# Patient Record
Sex: Female | Born: 1945 | Race: White | Hispanic: No | Marital: Single | Smoking: Never smoker
Health system: Southern US, Community
[De-identification: ages and names within clinical notes are randomized; demographics above are authoritative.]

## PROBLEM LIST (undated history)

## (undated) DIAGNOSIS — N189 Chronic kidney disease, unspecified: Secondary | ICD-10-CM

## (undated) DIAGNOSIS — E785 Hyperlipidemia, unspecified: Secondary | ICD-10-CM

## (undated) DIAGNOSIS — J323 Chronic sphenoidal sinusitis: Secondary | ICD-10-CM

## (undated) DIAGNOSIS — L409 Psoriasis, unspecified: Secondary | ICD-10-CM

## (undated) DIAGNOSIS — H532 Diplopia: Secondary | ICD-10-CM

## (undated) DIAGNOSIS — G4700 Insomnia, unspecified: Secondary | ICD-10-CM

## (undated) DIAGNOSIS — R7302 Impaired glucose tolerance (oral): Secondary | ICD-10-CM

## (undated) DIAGNOSIS — F329 Major depressive disorder, single episode, unspecified: Secondary | ICD-10-CM

## (undated) DIAGNOSIS — L738 Other specified follicular disorders: Secondary | ICD-10-CM

## (undated) DIAGNOSIS — K5790 Diverticulosis of intestine, part unspecified, without perforation or abscess without bleeding: Secondary | ICD-10-CM

## (undated) DIAGNOSIS — I872 Venous insufficiency (chronic) (peripheral): Secondary | ICD-10-CM

## (undated) DIAGNOSIS — M8580 Other specified disorders of bone density and structure, unspecified site: Secondary | ICD-10-CM

## (undated) DIAGNOSIS — I10 Essential (primary) hypertension: Secondary | ICD-10-CM

## (undated) DIAGNOSIS — K219 Gastro-esophageal reflux disease without esophagitis: Secondary | ICD-10-CM

## (undated) DIAGNOSIS — N390 Urinary tract infection, site not specified: Secondary | ICD-10-CM

## (undated) DIAGNOSIS — G20 Parkinson's disease: Secondary | ICD-10-CM

## (undated) DIAGNOSIS — M1990 Unspecified osteoarthritis, unspecified site: Secondary | ICD-10-CM

## (undated) DIAGNOSIS — M169 Osteoarthritis of hip, unspecified: Secondary | ICD-10-CM

## (undated) DIAGNOSIS — Z789 Other specified health status: Secondary | ICD-10-CM

## (undated) DIAGNOSIS — I471 Supraventricular tachycardia: Secondary | ICD-10-CM

## (undated) DIAGNOSIS — F439 Reaction to severe stress, unspecified: Secondary | ICD-10-CM

## (undated) DIAGNOSIS — J189 Pneumonia, unspecified organism: Secondary | ICD-10-CM

## (undated) DIAGNOSIS — G20A1 Parkinson's disease without dyskinesia, without mention of fluctuations: Secondary | ICD-10-CM

## (undated) DIAGNOSIS — R002 Palpitations: Secondary | ICD-10-CM

## (undated) HISTORY — PX: CATARACT EXTRACTION W/ INTRAOCULAR LENS  IMPLANT, BILATERAL: SHX1307

## (undated) HISTORY — DX: Insomnia, unspecified: G47.00

## (undated) HISTORY — PX: OTHER SURGICAL HISTORY: SHX169

## (undated) HISTORY — DX: Hyperlipidemia, unspecified: E78.5

## (undated) HISTORY — DX: Parkinson's disease without dyskinesia, without mention of fluctuations: G20.A1

## (undated) HISTORY — DX: Psoriasis, unspecified: L40.9

## (undated) HISTORY — PX: HEMORRHOID SURGERY: SHX153

## (undated) HISTORY — PX: JOINT REPLACEMENT: SHX530

## (undated) HISTORY — DX: Other specified follicular disorders: L73.8

## (undated) HISTORY — DX: Major depressive disorder, single episode, unspecified: F32.9

## (undated) HISTORY — DX: Other specified disorders of bone density and structure, unspecified site: M85.80

## (undated) HISTORY — PX: LASIK: SHX215

## (undated) HISTORY — DX: Supraventricular tachycardia: I47.1

## (undated) HISTORY — PX: URETHRAL SLING: SHX2621

## (undated) HISTORY — DX: Unspecified osteoarthritis, unspecified site: M19.90

## (undated) HISTORY — DX: Reaction to severe stress, unspecified: F43.9

## (undated) HISTORY — DX: Palpitations: R00.2

## (undated) HISTORY — DX: Chronic kidney disease, unspecified: N18.9

## (undated) HISTORY — DX: Diverticulosis of intestine, part unspecified, without perforation or abscess without bleeding: K57.90

## (undated) HISTORY — DX: Osteoarthritis of hip, unspecified: M16.9

## (undated) HISTORY — PX: EYE SURGERY: SHX253

## (undated) HISTORY — DX: Impaired glucose tolerance (oral): R73.02

## (undated) HISTORY — DX: Parkinson's disease: G20

## (undated) HISTORY — DX: Diplopia: H53.2

## (undated) HISTORY — DX: Gastro-esophageal reflux disease without esophagitis: K21.9

---
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
## 1998-12-10 DIAGNOSIS — E782 Mixed hyperlipidemia: Secondary | ICD-10-CM

## 1998-12-10 DIAGNOSIS — E785 Hyperlipidemia, unspecified: Secondary | ICD-10-CM

## 1998-12-10 HISTORY — DX: Mixed hyperlipidemia: E78.2

## 1998-12-10 HISTORY — DX: Hyperlipidemia, unspecified: E78.5

## 2000-09-24 ENCOUNTER — Encounter: Admission: RE | Admit: 2000-09-24 | Discharge: 2000-09-24 | Payer: Self-pay | Admitting: Family Medicine

## 2000-09-24 ENCOUNTER — Encounter: Payer: Self-pay | Admitting: Family Medicine

## 2001-12-24 ENCOUNTER — Encounter: Payer: Self-pay | Admitting: Obstetrics and Gynecology

## 2001-12-24 ENCOUNTER — Ambulatory Visit (HOSPITAL_COMMUNITY): Admission: RE | Admit: 2001-12-24 | Discharge: 2001-12-24 | Payer: Self-pay | Admitting: Obstetrics and Gynecology

## 2002-03-02 ENCOUNTER — Other Ambulatory Visit: Admission: RE | Admit: 2002-03-02 | Discharge: 2002-03-02 | Payer: Self-pay | Admitting: Obstetrics and Gynecology

## 2002-07-15 ENCOUNTER — Encounter: Admission: RE | Admit: 2002-07-15 | Discharge: 2002-07-15 | Payer: Self-pay | Admitting: Obstetrics and Gynecology

## 2002-07-15 ENCOUNTER — Encounter: Payer: Self-pay | Admitting: Obstetrics and Gynecology

## 2002-12-10 DIAGNOSIS — L409 Psoriasis, unspecified: Secondary | ICD-10-CM

## 2002-12-10 HISTORY — DX: Psoriasis, unspecified: L40.9

## 2002-12-30 ENCOUNTER — Encounter: Payer: Self-pay | Admitting: Obstetrics and Gynecology

## 2002-12-30 ENCOUNTER — Ambulatory Visit (HOSPITAL_COMMUNITY): Admission: RE | Admit: 2002-12-30 | Discharge: 2002-12-30 | Payer: Self-pay | Admitting: Obstetrics and Gynecology

## 2003-12-11 DIAGNOSIS — M8580 Other specified disorders of bone density and structure, unspecified site: Secondary | ICD-10-CM

## 2003-12-11 HISTORY — DX: Other specified disorders of bone density and structure, unspecified site: M85.80

## 2004-01-05 ENCOUNTER — Ambulatory Visit (HOSPITAL_COMMUNITY): Admission: RE | Admit: 2004-01-05 | Discharge: 2004-01-05 | Payer: Self-pay | Admitting: Family Medicine

## 2004-09-21 ENCOUNTER — Ambulatory Visit (HOSPITAL_COMMUNITY): Admission: RE | Admit: 2004-09-21 | Discharge: 2004-09-21 | Payer: Self-pay | Admitting: Obstetrics and Gynecology

## 2004-12-10 DIAGNOSIS — K5790 Diverticulosis of intestine, part unspecified, without perforation or abscess without bleeding: Secondary | ICD-10-CM

## 2004-12-10 HISTORY — DX: Diverticulosis of intestine, part unspecified, without perforation or abscess without bleeding: K57.90

## 2005-01-04 ENCOUNTER — Ambulatory Visit: Payer: Self-pay | Admitting: Gastroenterology

## 2005-01-22 ENCOUNTER — Ambulatory Visit: Payer: Self-pay | Admitting: Gastroenterology

## 2005-02-20 ENCOUNTER — Ambulatory Visit (HOSPITAL_COMMUNITY): Admission: RE | Admit: 2005-02-20 | Discharge: 2005-02-20 | Payer: Self-pay | Admitting: Obstetrics and Gynecology

## 2006-02-21 ENCOUNTER — Ambulatory Visit (HOSPITAL_COMMUNITY): Admission: RE | Admit: 2006-02-21 | Discharge: 2006-02-21 | Payer: Self-pay | Admitting: Obstetrics and Gynecology

## 2007-02-24 ENCOUNTER — Ambulatory Visit (HOSPITAL_COMMUNITY): Admission: RE | Admit: 2007-02-24 | Discharge: 2007-02-24 | Payer: Self-pay | Admitting: Obstetrics and Gynecology

## 2007-11-10 ENCOUNTER — Encounter: Admission: RE | Admit: 2007-11-10 | Discharge: 2007-11-10 | Payer: Self-pay | Admitting: Family Medicine

## 2007-12-11 DIAGNOSIS — I471 Supraventricular tachycardia, unspecified: Secondary | ICD-10-CM

## 2007-12-11 DIAGNOSIS — G4700 Insomnia, unspecified: Secondary | ICD-10-CM

## 2007-12-11 DIAGNOSIS — R002 Palpitations: Secondary | ICD-10-CM

## 2007-12-11 HISTORY — DX: Supraventricular tachycardia, unspecified: I47.10

## 2007-12-11 HISTORY — DX: Insomnia, unspecified: G47.00

## 2007-12-11 HISTORY — DX: Palpitations: R00.2

## 2007-12-11 HISTORY — DX: Supraventricular tachycardia: I47.1

## 2008-02-12 ENCOUNTER — Ambulatory Visit: Payer: Self-pay | Admitting: Internal Medicine

## 2008-02-24 ENCOUNTER — Ambulatory Visit: Payer: Self-pay

## 2008-02-24 ENCOUNTER — Encounter: Payer: Self-pay | Admitting: Internal Medicine

## 2008-03-01 ENCOUNTER — Ambulatory Visit (HOSPITAL_COMMUNITY): Admission: RE | Admit: 2008-03-01 | Discharge: 2008-03-01 | Payer: Self-pay | Admitting: Obstetrics and Gynecology

## 2008-09-21 ENCOUNTER — Ambulatory Visit: Payer: Self-pay | Admitting: Cardiology

## 2008-10-11 ENCOUNTER — Encounter: Admission: RE | Admit: 2008-10-11 | Discharge: 2008-10-11 | Payer: Self-pay | Admitting: Family Medicine

## 2008-10-18 ENCOUNTER — Encounter: Admission: RE | Admit: 2008-10-18 | Discharge: 2008-10-18 | Payer: Self-pay | Admitting: Family Medicine

## 2008-11-29 ENCOUNTER — Encounter: Admission: RE | Admit: 2008-11-29 | Discharge: 2008-11-29 | Payer: Self-pay | Admitting: Obstetrics and Gynecology

## 2008-12-10 DIAGNOSIS — F32A Depression, unspecified: Secondary | ICD-10-CM

## 2008-12-10 HISTORY — DX: Depression, unspecified: F32.A

## 2008-12-10 HISTORY — PX: TOTAL HIP ARTHROPLASTY: SHX124

## 2009-03-08 ENCOUNTER — Encounter: Admission: RE | Admit: 2009-03-08 | Discharge: 2009-03-08 | Payer: Self-pay | Admitting: Obstetrics and Gynecology

## 2009-03-17 ENCOUNTER — Encounter: Admission: RE | Admit: 2009-03-17 | Discharge: 2009-03-17 | Payer: Self-pay | Admitting: Family Medicine

## 2009-10-04 ENCOUNTER — Ambulatory Visit: Payer: Self-pay | Admitting: Internal Medicine

## 2009-10-04 DIAGNOSIS — I471 Supraventricular tachycardia: Secondary | ICD-10-CM

## 2009-10-18 ENCOUNTER — Encounter: Admission: RE | Admit: 2009-10-18 | Discharge: 2009-10-18 | Payer: Self-pay | Admitting: Family Medicine

## 2009-10-26 ENCOUNTER — Telehealth (INDEPENDENT_AMBULATORY_CARE_PROVIDER_SITE_OTHER): Payer: Self-pay | Admitting: *Deleted

## 2009-10-31 ENCOUNTER — Inpatient Hospital Stay (HOSPITAL_COMMUNITY): Admission: RE | Admit: 2009-10-31 | Discharge: 2009-11-04 | Payer: Self-pay | Admitting: Orthopedic Surgery

## 2009-12-10 DIAGNOSIS — R7302 Impaired glucose tolerance (oral): Secondary | ICD-10-CM

## 2009-12-10 DIAGNOSIS — H532 Diplopia: Secondary | ICD-10-CM

## 2009-12-10 HISTORY — DX: Diplopia: H53.2

## 2009-12-10 HISTORY — DX: Impaired glucose tolerance (oral): R73.02

## 2009-12-16 ENCOUNTER — Encounter (INDEPENDENT_AMBULATORY_CARE_PROVIDER_SITE_OTHER): Payer: Self-pay | Admitting: *Deleted

## 2009-12-26 ENCOUNTER — Encounter (INDEPENDENT_AMBULATORY_CARE_PROVIDER_SITE_OTHER): Payer: Self-pay | Admitting: *Deleted

## 2010-01-19 ENCOUNTER — Encounter (INDEPENDENT_AMBULATORY_CARE_PROVIDER_SITE_OTHER): Payer: Self-pay | Admitting: *Deleted

## 2010-01-20 ENCOUNTER — Ambulatory Visit: Payer: Self-pay | Admitting: Gastroenterology

## 2010-01-26 ENCOUNTER — Telehealth: Payer: Self-pay | Admitting: Gastroenterology

## 2010-01-30 ENCOUNTER — Ambulatory Visit: Payer: Self-pay | Admitting: Gastroenterology

## 2010-01-30 HISTORY — PX: COLONOSCOPY: SHX174

## 2010-03-10 ENCOUNTER — Ambulatory Visit (HOSPITAL_COMMUNITY): Admission: RE | Admit: 2010-03-10 | Discharge: 2010-03-10 | Payer: Self-pay | Admitting: Obstetrics and Gynecology

## 2011-01-09 NOTE — Letter (Signed)
Summary: Colonoscopy Letter  [facility]  [address] [place], [place] [address]   Phone: [phone]  Fax: [phone]      December 16, 2009 MRN: [id number]   [facility] [address] [place], [place]  [address]   Dear Ms. Winchel,   According to your medical record, it is time for you to schedule a Colonoscopy. The American Cancer Society recommends this procedure as a method to detect early colon cancer. Patients with a family history of colon cancer, or a personal history of colon polyps or inflammatory bowel disease are at increased risk.  This letter has beeen generated based on the recommendations made at the time of your procedure. If you feel that in your particular situation this may no longer apply, please contact our office.  Please call our office at [phone] to schedule this appointment or to update your records at your earliest convenience.  Thank you for cooperating with [place] to provide you with the very best care possible.   Sincerely,  Vania Rea. Jarold Motto, M.D.  [facility] Gastroenterology Division [phone]

## 2011-01-09 NOTE — Letter (Signed)
Summary: Previsit letter  [facility]  [address] [place], [place] [address]   Phone: [phone]  Fax: [phone]       12/26/2009 MRN: [id number]  [facility] [address] [place], [place]  [address]  Dear Marie Ford,  Welcome to the Gastroenterology Division at [facility].    You are scheduled to see a nurse for your pre-procedure visit on 01-20-10 at 11AM on the 3rd floor at [facility], [address]  We ask that you try to arrive at our office 15 minutes prior to your appointment time to allow for check-in.  Your nurse visit will consist of discussing your medical and surgical history, your immediate family medical history, and your medications.    Please bring a complete list of all your medications or, if you prefer, bring the medication bottles and we will list them.  We will need to be aware of both prescribed and over the counter drugs.  We will need to know exact dosage information as well.  If you are on blood thinners (Coumadin, Plavix, Aggrenox, Ticlid, etc.) please call our office today/prior to your appointment, as we need to consult with your physician about holding your medication.   Please be prepared to read and sign documents such as consent forms, a financial agreement, and acknowledgement forms.  If necessary, and with your consent, a friend or relative is welcome to sit-in on the nurse visit with you.  Please bring your insurance card so that we may make a copy of it.  If your insurance requires a referral to see a specialist, please bring your referral form from your primary care physician.  No co-pay is required for this nurse visit.     If you cannot keep your appointment, please call [phone] to cancel or reschedule prior to your appointment date.  This allows [place] the opportunity to schedule an appointment for another patient in need of care.    Thank you for choosing [facility] for your medical needs.  We  appreciate the opportunity to care for you.  Please visit [place] at our website  to learn more about our practice.                     Sincerely.                                                                                                                   The Gastroenterology Division

## 2011-01-09 NOTE — Procedures (Signed)
Summary: Colonoscopy  Patient: Marie Ford Note: All result statuses are Final unless otherwise noted.  Tests: (1) Colonoscopy (COL)   COL Colonoscopy           DONE     [facility]     [address]     [place], [place]  [address]           COLONOSCOPY PROCEDURE REPORT           PATIENT:  Marie Ford  MR#:  [id number]     BIRTHDATE:  1946-09-12, 63 yrs. old  GENDER:  female           ENDOSCOPIST:  Vania Rea. Jarold Motto, MD, [facility]     Referred by:           PROCEDURE DATE:  01/30/2010     PROCEDURE:  Colonoscopy, Diagnostic     ASA CLASS:  Class II     INDICATIONS:  family history of colon cancer her brother had     ulcerative colitis and associated cancer in his 77's.           MEDICATIONS:   Fentanyl 75 mcg IV, Versed 6 mg IV           DESCRIPTION OF PROCEDURE:   After the risks benefits and     alternatives of the procedure were thoroughly explained, informed     consent was obtained.  Digital rectal exam was performed and     revealed no abnormalities.   The LB CF-[id number] endoscope     was introduced through the anus and advanced to the cecum, which     was identified by both the appendix and ileocecal valve, without     limitations.  The quality of the prep was excellent, using     MoviPrep.  The instrument was then slowly withdrawn as the colon     was fully examined.     <<PROCEDUREIMAGES>>           FINDINGS:  Mild diverticulosis was found sigmoid to descending  No     polyps or cancers were seen.  This was otherwise a normal     examination of the colon.   Retroflexed views in the rectum     revealed Unable to retroflex.    The scope was then withdrawn from     the patient and the procedure completed.           COMPLICATIONS:  None           ENDOSCOPIC IMPRESSION:     1) Mild diverticulosis in the sigmoid to descending     2) No polyps or cancers     3) Otherwise normal examination     4) Unable to retroflex     RECOMMENDATIONS:     1)  Continue current colorectal screening recommendations for     "routine risk" patients with a repeat colonoscopy in 10 years.           REPEAT EXAM:  No           ______________________________     Vania Rea. Jarold Motto, MD, [facility]           CC:  [name], MD           n.     Rosalie DoctorMarland Kitchen   Vania Rea. Patterson at 01/30/2010 10:07 AM           Skipper Cliche, [phone]  Note: An exclamation mark (!) indicates a  result that was not dispersed into the flowsheet. Document Creation Date: 01/30/2010 10:08 AM _______________________________________________________________________  (1) Order result status: Final Collection or observation date-time: 01/30/2010 10:00 Requested date-time:  Receipt date-time:  Reported date-time:  Referring Physician:   Ordering Physician: Sheryn Bison [phone]) Specimen Source:  Source: Launa Grill Order Number: [id number] Lab site:   Appended Document: Colonoscopy    Clinical Lists Changes  Observations: Added new observation of COLONNXTDUE: 01/2020 (01/30/2010 11:13)      Appended Document: Colonoscopy     Procedures Next Due Date:    Colonoscopy: 01/2020

## 2011-01-09 NOTE — Miscellaneous (Signed)
Summary: [facility] Previsit/prep  Clinical Lists Changes  Medications: Added new medication of MOVIPREP 100 GM  SOLR (PEG-KCL-NACL-NASULF-NA ASC-C) As per prep instructions. - Signed Rx of MOVIPREP 100 GM  SOLR (PEG-KCL-NACL-NASULF-NA ASC-C) As per prep instructions.;  #1 x 0;  Signed;  Entered by: Wyona Almas RN;  Authorized by: Mardella Layman MD [facility];  Method used: Electronically to [facility]. #[id number]*, [address], [place], [place], [place]  [address], Ph: [phone], Fax: [phone] Observations: Added new observation of ALLERGY REV: Done (01/20/2010 11:08)    Prescriptions: MOVIPREP 100 GM  SOLR (PEG-KCL-NACL-NASULF-NA ASC-C) As per prep instructions.  #1 x 0   Entered by:   Wyona Almas RN   Authorized by:   Mardella Layman MD [facility]   Signed by:   Wyona Almas RN on 01/20/2010   Method used:   Electronically to        [facility]. [phone]* (retail)       [address]       [place], [place]  [address]       Ph: [phone]       Fax: [phone]   RxID:   [id number]

## 2011-01-09 NOTE — Letter (Signed)
Summary: [facility] Instructions  [facility]  [address] [place], [place] [address]   Phone: [phone]  Fax: [phone]       Marie Ford    09/26/1946    MRN: [id number]        Procedure Day Dorna Bloom:  Duanne Limerick  01/30/10     Arrival Time:  8:30AM      Procedure Time:  9:30AM     Location of Procedure:                    _ X_  [facility] (4th Floor)   PREPARATION FOR COLONOSCOPY WITH MOVIPREP   Starting 5 days prior to your procedure 01/25/10 do not eat nuts, seeds, popcorn, corn, beans, peas,  salads, or any raw vegetables.  Do not take any fiber supplements (e.g. Metamucil, Citrucel, and Benefiber).  THE DAY BEFORE YOUR PROCEDURE         DATE: 01/29/10  DAY: SUNDAY  1.  Drink clear liquids the entire day-NO SOLID FOOD  2.  Do not drink anything colored red or purple.  Avoid juices with pulp.  No orange juice.  3.  Drink at least 64 oz. (8 glasses) of fluid/clear liquids during the day to prevent dehydration and help the prep work efficiently.  CLEAR LIQUIDS INCLUDE: Water Jello Ice Popsicles Tea (sugar ok, no milk/cream) Powdered fruit flavored drinks Coffee (sugar ok, no milk/cream) Gatorade Juice: apple, white grape, white cranberry  Lemonade Clear bullion, consomm, broth Carbonated beverages (any kind) Strained chicken noodle soup Hard Candy                             4.  In the morning, mix first dose of MoviPrep solution:    Empty 1 Pouch A and 1 Pouch B into the disposable container    Add lukewarm drinking water to the top line of the container. Mix to dissolve    Refrigerate (mixed solution should be used within 24 hrs)  5.  Begin drinking the prep at 5:00 p.m. The MoviPrep container is divided by 4 marks.   Every 15 minutes drink the solution down to the next mark (approximately 8 oz) until the full liter is complete.   6.  Follow completed prep with 16 oz of clear liquid of your choice (Nothing red or purple).   Continue to drink clear liquids until bedtime.  7.  Before going to bed, mix second dose of MoviPrep solution:    Empty 1 Pouch A and 1 Pouch B into the disposable container    Add lukewarm drinking water to the top line of the container. Mix to dissolve    Refrigerate  THE DAY OF YOUR PROCEDURE      DATE: 01/30/10 DAY: MONDAY  Beginning at 4:30AM (5 hours before procedure):         1. Every 15 minutes, drink the solution down to the next mark (approx 8 oz) until the full liter is complete.  2. Follow completed prep with 16 oz. of clear liquid of your choice.    3. You may drink clear liquids until 7:30AM (2 HOURS BEFORE PROCEDURE).   MEDICATION INSTRUCTIONS  Unless otherwise instructed, you should take regular prescription medications with a small sip of water   as early as possible the morning of your procedure.           OTHER INSTRUCTIONS  You will need a responsible  adult at least 67 years of age to accompany you and drive you home.   This person must remain in the waiting room during your procedure.  Wear loose fitting clothing that is easily removed.  Leave jewelry and other valuables at home.  However, you may wish to bring a book to read or  an iPod/MP3 player to listen to music as you wait for your procedure to start.  Remove all body piercing jewelry and leave at home.  Total time from sign-in until discharge is approximately 2-3 hours.  You should go home directly after your procedure and rest.  You can resume normal activities the  day after your procedure.  The day of your procedure you should not:   Drive   Make legal decisions   Operate machinery   Drink alcohol   Return to work  You will receive specific instructions about eating, activities and medications before you leave.    The above instructions have been reviewed and explained to me by  Wyona Almas RN  January 20, 2010 11:40 AM     I fully understand and can verbalize  these instructions _____________________________ Date _________

## 2011-01-09 NOTE — Progress Notes (Signed)
Summary: Prep medication for procedure  Phone Note Call from Patient Call back at Home Phone [phone]   Caller: Patient Call For: Dr. Jarold Motto Reason for Call: Talk to Nurse Summary of Call: Moviprep is not covered by her Ins. and wants to know if something else canbe prescribed Initial call taken by: Karna Christmas,  January 26, 2010 4:15 PM  Follow-up for Phone Call        Left message on answering machine that Dr. Jarold Motto only uses movi prep and to call back tomorrow if she has any more questions. Follow-up by: Wyona Almas RN,  January 26, 2010 5:31 PM

## 2011-01-30 ENCOUNTER — Other Ambulatory Visit (HOSPITAL_COMMUNITY): Payer: BC Managed Care – PPO

## 2011-01-30 ENCOUNTER — Encounter (HOSPITAL_COMMUNITY)
Admission: RE | Admit: 2011-01-30 | Discharge: 2011-01-30 | Disposition: A | Discharge status: Home / Self Care | Payer: Medicare Other | Source: Ambulatory Visit | Attending: Obstetrics & Gynecology | Admitting: Obstetrics & Gynecology

## 2011-01-30 DIAGNOSIS — Z01812 Encounter for preprocedural laboratory examination: Secondary | ICD-10-CM | POA: Insufficient documentation

## 2011-01-30 LAB — CBC
Hemoglobin: 14.5 g/dL (ref 12.0–15.0)
WBC: 5.5 10*3/uL (ref 4.0–10.5)

## 2011-01-31 ENCOUNTER — Other Ambulatory Visit (HOSPITAL_COMMUNITY): Payer: Self-pay | Admitting: Internal Medicine

## 2011-01-31 DIAGNOSIS — Z1231 Encounter for screening mammogram for malignant neoplasm of breast: Secondary | ICD-10-CM

## 2011-02-01 ENCOUNTER — Ambulatory Visit (HOSPITAL_COMMUNITY)
Admission: RE | Admit: 2011-02-01 | Discharge: 2011-02-01 | Disposition: A | Discharge status: Home / Self Care | Payer: Medicare Other | Source: Ambulatory Visit | Attending: Obstetrics & Gynecology | Admitting: Obstetrics & Gynecology

## 2011-02-01 DIAGNOSIS — N393 Stress incontinence (female) (male): Secondary | ICD-10-CM | POA: Insufficient documentation

## 2011-02-08 NOTE — Op Note (Addendum)
Marie Ford, Marie Ford                ACCOUNT NO.:  [id number]  MEDICAL RECORD NO.:  [id number]           PATIENT TYPE:  O  LOCATION:  [facility]                           FACILITY:  [facility]  PHYSICIAN:  Darryl Nestle, MD     DATE OF BIRTH:  03-21-1946  DATE OF PROCEDURE:  02/01/2011 DATE OF DISCHARGE:  02/01/2011                              OPERATIVE REPORT   PREOPERATIVE DIAGNOSIS:  Stress urinary incontinence.  POSTOPERATIVE DIAGNOSIS:  Stress urinary incontinence.  PROCEDURE:  Midurethral sling with TVT Exact and cystoscopy.  SURGEON:  Darryl Nestle, MD.  ASSISTANT:  None.  ANESTHESIA:  General.  FINDINGS:  Cystoscopy revealed bladder within normal limits.  No perforation.  Bilateral ureteral openings seen with efflux of urine.  SPECIMEN:  None.  CONDITION:  Stable.  DISPOSITION:  PACU.  ESTIMATED BLOOD LOSS:  Minimal.  IV FLUIDS:  LR 1200 mL.  PROCEDURE:  The patient is a 65 year old woman with stress urinary incontinence.  She underwent urodynamics in office that revealed genuine stress urinary incontinence with urethral hypermobility without ISD or detrusor overactivity.  The patient was given options of expectant management, pelvic floor therapy, and surgery.  She preferred to proceed with surgery.  Risks and complications of surgery including infection, bleeding, damage to internal organs including bladder perforation, blood vessel injury as well as delayed complications including mesh-related problems such as dyspareunia, mesh erosion, and pain reviewed.  The patient gave informed written consent.  She was brought to the operating room with IV running.  She received gentamicin and clindamycin preoperatively for antibiotic coverage.  She underwent general anesthesia without difficulty.  She was given dorsal lithotomy position, parts were prepped and draped in normal sterile fashion.  Foley catheter was placed for drainage of bladder.  Exit points of the TVT needles  were marked on the skin two fingerbreadths on both the sides of the midline, right behind the pubic bone.  Dilute 20 in 50 saline -Pitressin, 50 mL was used for infiltration.  Retropubic space was infiltrated from skin, coming downward and from the vagina laterally in the direction of needles.  Two Allis clamps were applied at the midurethral level and the vagina.  A vertical incision was made with scalpel, and the two edges of the cut were grasped with Allis clamps.  With Metzenbaum scissors, dissection was performed in the lateral vaginal walls, directing towards ipsilateral shoulders until the undersurface of pubic bone was reached. The exit sites were incised where the marks were placed on the skin in the pubic area.  The TVT tape with the plastic guide was mounted on the metal introducer.  Foley catheter guide was introduced, and the bladder neck was moved to the patient's left.  Dissection was performed with passage of needle on the patient's right, directing towards her ipsilateral shoulder and coming up towards the incision right behind the pubic bone while continuously hugging the pubic bone.  As the plastic cannula tip was seen out of the incision, it was grasped with Kelly clamp, and the metal introducer was removed.  In a similar fashion, the bladder catheter guide was moved to  the patient's left, and the metal introducer was mounted on the TVT tape cannula and was directed in the direction of the patient's ipsilateral left shoulder while continuously hugging the bone.  The needle was passed and brought up to the incision and grasped with Kelly clamp.  The metal introducer was removed.  The Foley catheter and catheter guide were removed.  Cystoscopy was performed.  Saline 300 mL was introduced, and a thorough cystoscopy in stepwise fashion revealed no evidence of bladder injury or perforation. Bilateral ureteral openings were seen with normal efflux of urine. Cystoscope was  removed.  Bladder was drained with Foley catheter.  The two plastic tips were now pulled through the skin, and the tape was brought out.  Plastic cannulas were excised.  Mayo scissors was placed between the vaginal dissection and the tape, and the tape was now pulled through and secured in place tension-free.  The excess tape was excised, and the skin was approximated with Dermabond.  Vaginal incision was closed using 3-0 chromic in continuous interlocking fashion.  Hemostasis was excellent.  Foley catheter was left in place, attached to a bag, and patient will undergo a voiding trial in the PACU. All counts correct x 2.   Surgery was completed without complications, and findings were discussed with the patient's family.  The patient was brought out to the recovery room in stable condition, and she will undergo voiding trial in the recovery room.     Darryl Nestle, MD     VM/MEDQ  D:  02/01/2011  T:  02/02/2011  Job:  [id number]  Electronically Signed by Susa Day Lora Glomski  on 02/08/2011 05:02:21 PM

## 2011-03-13 ENCOUNTER — Other Ambulatory Visit (HOSPITAL_COMMUNITY): Payer: Self-pay | Admitting: Obstetrics & Gynecology

## 2011-03-13 ENCOUNTER — Ambulatory Visit (HOSPITAL_COMMUNITY)
Admission: RE | Admit: 2011-03-13 | Discharge: 2011-03-13 | Disposition: A | Discharge status: Home / Self Care | Payer: Medicare Other | Source: Ambulatory Visit | Attending: Internal Medicine | Admitting: Internal Medicine

## 2011-03-13 DIAGNOSIS — Z1231 Encounter for screening mammogram for malignant neoplasm of breast: Secondary | ICD-10-CM

## 2011-03-14 LAB — URINALYSIS, ROUTINE W REFLEX MICROSCOPIC
Bilirubin Urine: NEGATIVE
Bilirubin Urine: NEGATIVE
Glucose, UA: NEGATIVE mg/dL
Glucose, UA: NEGATIVE mg/dL
Hgb urine dipstick: NEGATIVE
Ketones, ur: NEGATIVE mg/dL
Nitrite: NEGATIVE
Specific Gravity, Urine: 1.023 (ref 1.005–1.030)
pH: 6 (ref 5.0–8.0)
pH: 6.5 (ref 5.0–8.0)

## 2011-03-14 LAB — BASIC METABOLIC PANEL
BUN: 12 mg/dL (ref 6–23)
CO2: 27 mEq/L (ref 19–32)
CO2: 30 mEq/L (ref 19–32)
Chloride: 103 mEq/L (ref 96–112)
Glucose, Bld: 106 mg/dL — ABNORMAL HIGH (ref 70–99)
Potassium: 3.5 mEq/L (ref 3.5–5.1)
Potassium: 4 mEq/L (ref 3.5–5.1)
Sodium: 137 mEq/L (ref 135–145)

## 2011-03-14 LAB — CBC
HCT: 30.6 % — ABNORMAL LOW (ref 36.0–46.0)
HCT: 30.7 % — ABNORMAL LOW (ref 36.0–46.0)
Hemoglobin: 10.4 g/dL — ABNORMAL LOW (ref 12.0–15.0)
MCHC: 33.9 g/dL (ref 30.0–36.0)
MCHC: 34.2 g/dL (ref 30.0–36.0)
MCHC: 34.8 g/dL (ref 30.0–36.0)
MCV: 93.9 fL (ref 78.0–100.0)
Platelets: 261 10*3/uL (ref 150–400)
RBC: 3.1 MIL/uL — ABNORMAL LOW (ref 3.87–5.11)
RBC: 3.25 MIL/uL — ABNORMAL LOW (ref 3.87–5.11)
RDW: 12.7 % (ref 11.5–15.5)
WBC: 10.8 10*3/uL — ABNORMAL HIGH (ref 4.0–10.5)
WBC: 11.3 10*3/uL — ABNORMAL HIGH (ref 4.0–10.5)

## 2011-03-14 LAB — PROTIME-INR
INR: 1.26 (ref 0.00–1.49)
INR: 1.41 (ref 0.00–1.49)
Prothrombin Time: 12.9 seconds (ref 11.6–15.2)
Prothrombin Time: 15.7 seconds — ABNORMAL HIGH (ref 11.6–15.2)

## 2011-03-14 LAB — URINE CULTURE
Colony Count: NO GROWTH
Culture: NO GROWTH

## 2011-03-14 LAB — APTT: aPTT: 30 seconds (ref 24–37)

## 2011-03-14 LAB — ABO/RH: ABO/RH(D): A POS

## 2011-04-24 NOTE — Assessment & Plan Note (Signed)
[facility]                            CARDIOLOGY OFFICE NOTE   NAME:Marie Ford, Marie Ford                       MRN:          [id number]  DATE:02/12/2008                            DOB:          01-19-1946    REFERRING PHYSICIAN:  [name], M.D.   REASON FOR CONSULTATION:  Palpitations.   HISTORY OF PRESENT ILLNESS:  Marie Ford is a delightful 65 year old  woman with a history of paroxysmal SVT as well as psoriasis and  hyperlipidemia.  She denies any history of known coronary artery  disease.  She has never had formal stress test or cardiac  catheterization.   Apparently, she developed palpitations back in 2007 when she was under a  lot of stress.  She saw [name] who did a 24-hour Holter monitor  which documented multiple brief episodes of SVT as well as occasional  PVCs.  He recommended that she take Verapamil.  However, she chose not  to do this.  She notes that as the stress got better, the palpitations  resolved.  However, over the less 3-4 months, she has been under a great  deal of more stress due to family situations.  She noticed that the  palpitations recurred.  She says these are mostly at night.  She feels  her heart race for a minute or two and then it stops.  She has never had  syncope or presyncope.  It is not accompanied by chest pain or shortness  of breath.  She is a fairly active person.  She teaches fly fishing, but  she has a hard time doing much aerobic exercise due to sciatica  problems.  She does, however, try to walk one to two miles several days  a week and this was without any cardia respiratory problems.   Fortunately, over the past 2-3 months the palpitations have been  decreasing and she feels she is getting her stress under better control.  She does drink a small amount of caffeine, but not large amounts.  She  has not had a prolonged episode of palpitations.   REVIEW OF SYSTEMS:  As per HPI and problem list.   Otherwise, all systems  negative.   PROBLEM LIST:  1. Paroxysmal SVT, supraventricular tachycardia.  2. Hyperlipidemia.  3. Hand psoriasis.   CURRENT MEDICATIONS:  1. Simvastatin 80 a day.  2. Calcium plus vitamin D.  3. Glucosamine.  4. Multivitamin.  5. Aspirin.   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  She now leads fly fishing tours.  She  has an occasional glass of wine, but no tobacco.  She is single with one  child.   FAMILY HISTORY:  Mother died at 85 due to congestive heart failure.  Father died at 65 due to stroke and heart failure.  She had two sisters,  one sister with non-Hodgkin's lymphoma.  She had two brothers, one died  from liver cancer.  No family history of premature heart disease.   PHYSICAL EXAMINATION:  GENERAL:  She is  well-appearing, no acute  distress, ambulates around the clinic without respiratory difficulty.  VITAL SIGNS:  Blood pressure is 119/79 with heart rate 82, weight 181.  HEENT:  Normal.  NECK:  Supple.  No JVD.  Carotid 2+ bilateral bruits.  There is no  lymphadenopathy or thyromegaly.  CARDIAC:  PMI is nondisplaced.  Regular rate and rhythm.  No murmurs,  rubs or gallops.  LUNGS:  Clear.  ABDOMEN:  Soft, nontender, nondistended hepatosplenomegaly, no bruits,  no masses.  Good bowel sounds.  EXTREMITIES:  Warm.  No cyanosis, clubbing or edema.  No rash.  NEUROLOGICAL:  Alert and x3.  Cranial nerves II-XII are intact.  Moves  all four extremities without difficulty.  Affect is pleasant.   STUDIES:  EKG shows normal sinus rhythm at a rate of 76.  There are no  ST-T wave abnormalities.  QT interval was normal.  There is no evidence  of pre-excitation.   ASSESSMENT/PLAN:  1. Paroxysmal SVT.  We had a long talk about the mechanisms that are      responsible for this.  I have instructed her to avoid caffeine as      much as possible.  We have also given her prescription  for      diltiazem 30 mg to take 1-2 tablets p.r.n. for severe palpitations.      We will go ahead and get an echocardiogram and a stress echo to      make sure she has a structurally normal heart with no underlying      ischemia.  However, I suspect these are just benign episodes of      SVT.  Should they get worse and not be controlled by medication, we      could always consider ablation.  However, I think this would be      down the road.  2. Hyperlipidemia.  Given her psoriasis, she is at high risk for      vascular inflammation.  Would like to see her LDL below 100 if      possible.  I could consider adding Zetia 10 or switching her over      to Crestor 40.  I will leave this discretion of [name].   DISPOSITION:  Will see her back in several months after her stress test  and echocardiogram to see how she is doing.     Bevelyn Buckles. Bensimhon, MD  Electronically Signed    DRB/MedQ  DD: 02/12/2008  DT: 02/13/2008  Job #: [id number]   cc:   [name], M.D.

## 2011-04-24 NOTE — Assessment & Plan Note (Signed)
Marie Ford                            CARDIOLOGY OFFICE NOTE   NAME:Abdou, Marie WITHEM                       MRN:          [id number]  DATE:09/21/2008                            DOB:          [birth year]    PRIMARY CARE PHYSICIAN:  [name], MD   INTERVAL HISTORY:  Marie Ford is a delightful 65 year old woman with a  history of paroxysmal SVT as well as psoriasis and hyperlipidemia.  She  had a normal stress echo back in March 2009.  Holter monitor showed  multiple brief episodes of SVT as well as occasional PVCs.  She has been  treated with p.r.n., diltiazem.   She returns today for routine followup.  Overall, she says she is doing  fairly well.  She has been exercising; however, she has been developing  some arthritis particularly in her right hip and is worried this may be  related to psoriasis.  She has had a difficult year with the passing of  her sister from lymphoma.  When she was under a lot of stress, she said  the palpitations were fairly severe and she would take a diltiazem most  nights for palpitations.  However since August, she says this has really  been much better.  She has not taken any diltiazem since.  She does feel  wiped out of in the next morning after taking a 30 mg tablet.  She has  had some mild twinges in her chest, but these have not been exertional.  She tells me that she has noticed markedly decreased tolerance for  stress, inability to multitask.  However, she denies frank depression.   CURRENT MEDICATIONS:  1. Simvastatin 80 a day.  2. Calcium.  3. Multivitamin.  4. Aspirin 81 a day.   PHYSICAL EXAMINATION:  GENERAL:  She is well-appearing in no acute  distress, ambulates around the clinic without any respiratory  difficulty.  VITAL SIGNS:  Blood pressure is 118/82, heart rate is 73 weight is 179.  HEENT:  Normal.  NECK:  Supple.  There is no JVD.  Carotids are 2+ bilaterally without  any bruits.  There is no  lymphadenopathy or thyromegaly.  CARDIAC:  Regular rate and rhythm.  No murmurs, rubs or gallops.  PMI is normal.  LUNGS:  Clear.  ABDOMEN:  Soft, nontender, nondistended.  No hepatosplenomegaly, no  bruits, no masses.  Good bowel sounds.  EXTREMITIES:  Warm with no  cyanosis, clubbing or edema.  No rash.  NEUROLOGICAL:  Alert and oriented x3.  Cranial nerves II through XII are  intact.  Moves all four extremities without difficulty.  Affect is  pleasant.   EKG shows normal sinus rhythm at rate of 73.  No ST-T wave  abnormalities.  QT intervals normal.  There is no pre-excitation.   ASSESSMENT AND PLAN:  1. Supraventricular tachycardia, this appears to be stress related.      We will continue with the p.r.n. diltiazem.  2. Decreased ability to handle stress.  We did have a long talk about      possible addition of an selective serotonin  reuptake inhibitor for      anxiety and better coping skills.  She will discuss this with Dr.      Duaine Dredge.  3. Arthritis.  I am not clear this is osteoarthritis versus psoriatic      arthritis.  She will once again discuss with [name] about the      possibility of seeing a rheumatologist.   DISPOSITION:  We will see her in 1 year for a routine followup.     Bevelyn Buckles. Bensimhon, MD  Electronically Signed    DRB/MedQ  DD: 09/21/2008  DT: 09/22/2008  Job #: [id number]

## 2011-10-08 ENCOUNTER — Other Ambulatory Visit: Payer: Self-pay | Admitting: Family Medicine

## 2011-10-08 ENCOUNTER — Ambulatory Visit
Admission: RE | Admit: 2011-10-08 | Discharge: 2011-10-08 | Disposition: A | Discharge status: Home / Self Care | Payer: BC Managed Care – PPO | Source: Ambulatory Visit | Attending: Family Medicine | Admitting: Family Medicine

## 2011-10-08 DIAGNOSIS — R05 Cough: Secondary | ICD-10-CM

## 2011-12-11 DIAGNOSIS — K219 Gastro-esophageal reflux disease without esophagitis: Secondary | ICD-10-CM

## 2011-12-11 HISTORY — DX: Gastro-esophageal reflux disease without esophagitis: K21.9

## 2012-02-25 ENCOUNTER — Other Ambulatory Visit (HOSPITAL_COMMUNITY): Payer: Self-pay | Admitting: Obstetrics & Gynecology

## 2012-02-25 DIAGNOSIS — Z1231 Encounter for screening mammogram for malignant neoplasm of breast: Secondary | ICD-10-CM

## 2012-03-21 ENCOUNTER — Ambulatory Visit (HOSPITAL_COMMUNITY)
Admission: RE | Admit: 2012-03-21 | Discharge: 2012-03-21 | Disposition: A | Discharge status: Home / Self Care | Payer: Medicare Other | Source: Ambulatory Visit | Attending: Obstetrics & Gynecology | Admitting: Obstetrics & Gynecology

## 2012-03-21 DIAGNOSIS — Z1231 Encounter for screening mammogram for malignant neoplasm of breast: Secondary | ICD-10-CM | POA: Insufficient documentation

## 2012-03-25 ENCOUNTER — Ambulatory Visit (INDEPENDENT_AMBULATORY_CARE_PROVIDER_SITE_OTHER): Payer: Medicare Other | Admitting: *Deleted

## 2012-03-25 DIAGNOSIS — R0989 Other specified symptoms and signs involving the circulatory and respiratory systems: Secondary | ICD-10-CM

## 2012-04-04 NOTE — Procedures (Unsigned)
CAROTID DUPLEX EXAM  INDICATION:  Diminished carotid pulse  HISTORY: Diabetes:  No Cardiac:  No Hypertension:  No Smoking:  No Previous Surgery:  No CV History:  Complaint of double vision in left eye Amaurosis Fugax No, Paresthesias No, Hemiparesis No                                      RIGHT             LEFT Brachial systolic pressure:         128               122 Brachial Doppler waveforms:         Normal            Normal Vertebral direction of flow:        Antegrade         Antegrade DUPLEX VELOCITIES (cm/sec) CCA peak systolic                   86                85 ECA peak systolic                   99                51 ICA peak systolic                   66                57 ICA end diastolic                   17                24 PLAQUE MORPHOLOGY:                  Heterogeneous     Heterogeneous PLAQUE AMOUNT:                      Mild              Mild PLAQUE LOCATION:                    ECA/bifurcation   ICA  IMPRESSION:  No hemodynamically significant stenoses of the bilateral carotid arteries noted with plaque formations as described above.  ___________________________________________ Janetta Hora. Fields, MD  CH/MEDQ  D:  03/25/2012  T:  03/25/2012  Job:  [id number]

## 2012-12-10 DIAGNOSIS — J189 Pneumonia, unspecified organism: Secondary | ICD-10-CM

## 2012-12-10 HISTORY — DX: Pneumonia, unspecified organism: J18.9

## 2013-01-29 ENCOUNTER — Other Ambulatory Visit (HOSPITAL_COMMUNITY): Payer: Self-pay | Admitting: Obstetrics & Gynecology

## 2013-01-29 DIAGNOSIS — Z1231 Encounter for screening mammogram for malignant neoplasm of breast: Secondary | ICD-10-CM

## 2013-02-02 ENCOUNTER — Other Ambulatory Visit (HOSPITAL_COMMUNITY): Payer: Self-pay | Admitting: Obstetrics & Gynecology

## 2013-02-02 DIAGNOSIS — M8580 Other specified disorders of bone density and structure, unspecified site: Secondary | ICD-10-CM

## 2013-03-20 ENCOUNTER — Other Ambulatory Visit: Payer: Self-pay | Admitting: Family Medicine

## 2013-03-20 DIAGNOSIS — R319 Hematuria, unspecified: Secondary | ICD-10-CM

## 2013-03-23 ENCOUNTER — Other Ambulatory Visit: Payer: Medicare Other

## 2013-03-30 ENCOUNTER — Ambulatory Visit
Admission: RE | Admit: 2013-03-30 | Discharge: 2013-03-30 | Disposition: A | Discharge status: Home / Self Care | Payer: Medicare Other | Source: Ambulatory Visit | Attending: Family Medicine | Admitting: Family Medicine

## 2013-03-30 ENCOUNTER — Ambulatory Visit (HOSPITAL_COMMUNITY)
Admission: RE | Admit: 2013-03-30 | Discharge: 2013-03-30 | Disposition: A | Discharge status: Home / Self Care | Payer: Medicare Other | Source: Ambulatory Visit | Attending: Obstetrics & Gynecology | Admitting: Obstetrics & Gynecology

## 2013-03-30 DIAGNOSIS — Z1231 Encounter for screening mammogram for malignant neoplasm of breast: Secondary | ICD-10-CM

## 2013-03-30 DIAGNOSIS — M8580 Other specified disorders of bone density and structure, unspecified site: Secondary | ICD-10-CM

## 2013-03-30 DIAGNOSIS — R319 Hematuria, unspecified: Secondary | ICD-10-CM

## 2013-03-30 DIAGNOSIS — Z13820 Encounter for screening for osteoporosis: Secondary | ICD-10-CM | POA: Insufficient documentation

## 2013-03-30 DIAGNOSIS — M899 Disorder of bone, unspecified: Secondary | ICD-10-CM | POA: Insufficient documentation

## 2013-03-30 DIAGNOSIS — Z780 Asymptomatic menopausal state: Secondary | ICD-10-CM | POA: Insufficient documentation

## 2013-03-30 MED ORDER — IOHEXOL 300 MG/ML  SOLN
125.0000 mL | Freq: Once | INTRAMUSCULAR | Status: AC | PRN
  Administered 2013-03-30: 125 mL via INTRAVENOUS

## 2013-11-03 ENCOUNTER — Other Ambulatory Visit: Payer: Self-pay | Admitting: Family Medicine

## 2013-11-03 ENCOUNTER — Ambulatory Visit
Admission: RE | Admit: 2013-11-03 | Discharge: 2013-11-03 | Disposition: A | Discharge status: Home / Self Care | Payer: Medicare Other | Source: Ambulatory Visit | Attending: Family Medicine | Admitting: Family Medicine

## 2013-11-03 DIAGNOSIS — R0600 Dyspnea, unspecified: Secondary | ICD-10-CM

## 2013-12-15 ENCOUNTER — Encounter: Payer: Self-pay | Admitting: *Deleted

## 2013-12-15 DIAGNOSIS — H5005 Alternating esotropia: Secondary | ICD-10-CM | POA: Insufficient documentation

## 2013-12-15 DIAGNOSIS — H532 Diplopia: Secondary | ICD-10-CM | POA: Insufficient documentation

## 2013-12-16 ENCOUNTER — Ambulatory Visit: Payer: Medicare Other | Admitting: Cardiovascular Disease

## 2013-12-17 ENCOUNTER — Ambulatory Visit: Payer: Medicare Other | Admitting: Cardiology

## 2013-12-29 ENCOUNTER — Encounter: Payer: Self-pay | Admitting: *Deleted

## 2013-12-29 ENCOUNTER — Ambulatory Visit: Payer: Medicare Other | Admitting: Cardiology

## 2014-02-05 ENCOUNTER — Ambulatory Visit (INDEPENDENT_AMBULATORY_CARE_PROVIDER_SITE_OTHER): Payer: Medicare Other | Admitting: Cardiology

## 2014-02-05 ENCOUNTER — Encounter: Payer: Self-pay | Admitting: Cardiology

## 2014-02-05 VITALS — BP 149/94 | HR 86 | Ht 66.0 in | Wt 186.8 lb

## 2014-02-05 DIAGNOSIS — R0989 Other specified symptoms and signs involving the circulatory and respiratory systems: Secondary | ICD-10-CM

## 2014-02-05 DIAGNOSIS — I471 Supraventricular tachycardia: Secondary | ICD-10-CM

## 2014-02-05 DIAGNOSIS — R0600 Dyspnea, unspecified: Secondary | ICD-10-CM

## 2014-02-05 DIAGNOSIS — R0609 Other forms of dyspnea: Secondary | ICD-10-CM

## 2014-02-05 NOTE — Patient Instructions (Signed)
Your physician has requested that you have a stress echocardiogram. For further information please visit [email or web address]. Please follow instruction sheet as given.  Your physician has requested that you have an echocardiogram. Echocardiography is a painless test that uses sound waves to create images of your heart. It provides your doctor with information about the size and shape of your heart and how well your heart's chambers and valves are working. This procedure takes approximately one hour. There are no restrictions for this procedure.  Please schedule stress echo and echo on same day.   Your physician recommends that you schedule a follow-up appointment in: 2 -3 weeks after tests.

## 2014-02-07 DIAGNOSIS — R0609 Other forms of dyspnea: Secondary | ICD-10-CM

## 2014-02-07 NOTE — Progress Notes (Signed)
Patient ID: Marie SaxonLinda C Ford, female   DOB: 07-Aug-1946, 68 y.o.   MRN: [id number] PCP: Dr. Duaine Ford  68 yo with history of SVT and hyperlipidemia presents for evaluation of exertional dyspnea.  In 12/14, patient had pneumonia. Since that time, she has noted exertional dyspnea.  She is short of breath walking up hills, up inclines, and up stairs.  No exertional chest pain.  She does get "twinges" of chest pain that last for a few seconds and resolve.  There is no clear trigger to these.  She walks her dog for a mile three times a week and does water aerobics three times a week.  She does not note dyspnea with these activities.  She has not had any recent tachypalpitations, SVT has been quiescent in recent years.  BP is mildly elevated today in the office.   ECG: NSR, normal  Labs (11/14): K 4.3, creatinine 0.85, LDL 98, HDL 57  PMH: 1. SVT: Quiescent. 2. Hyperlipidemia 3. Psoriasis 4. Stress echo in 3/09 was normal. 5. Carotid dopplers (4/13) normal 6. Diplopia with lateral gaze 7. Diverticulosis  SH: Retired from [employer], nonsmoker, occasional ETOH.  [place] lives for [place] months in the summer in [place]   FH: No premature CAD.  ROS: All systems reviewed and negative except as per HPI.   Current Outpatient Prescriptions  Medication Sig Dispense Refill  . aspirin 81 MG tablet Take 81 mg by mouth daily.      Marland Kitchen. CALCIUM-VITAMIN D PO Take by mouth daily.      . Levofloxacin (LEVAQUIN PO) Take by mouth.      . Multiple Vitamin (MULTIVITAMIN) tablet Take 1 tablet by mouth daily.      . rosuvastatin (CRESTOR) 40 MG tablet Take 80 mg by mouth daily.       No current facility-administered medications for this visit.    BP 149/94  Pulse 86  Ht 5\' 6"  (1.676 m)  Wt 84.732 kg (186 lb 12.8 oz)  BMI 30.16 kg/m2 General: NAD Neck: No JVD, no thyromegaly or thyroid nodule.  Lungs: Clear to auscultation bilaterally with normal respiratory effort. CV: Nondisplaced PMI.  Heart regular S1/S2,  no S3/S4, no murmur.  No peripheral edema.  No carotid bruit.  Normal pedal pulses.  Abdomen: Soft, nontender, no hepatosplenomegaly, no distention.  Skin: Intact without lesions or rashes.  Neurologic: Alert and oriented x 3.  Psych: Normal affect. Extremities: No clubbing or cyanosis.  HEENT: Normal.   Assessment/Plan: 1. Exertional dyspnea: With moderate exertion.  This has begun recently and has been most marked since her episode of PNA in 12/14.  Her ECG is normal.  It is possible that this is due to deconditioning post-PNA.  I do think that it would be reasonable to do an echocardiogram to assess LV and RV function and valves and also a stress echo to assess for ischemia.  If these are normal, no further cardiac workup and would suggest that she begin to increase her exercise level.  2. SVT: She has not had any recent runs of tachypalpitations.   3. Hyperlipidemia: Good lipids in 11/14 on Crestor.   Marie Ford 02/07/2014

## 2014-02-25 ENCOUNTER — Encounter: Payer: Self-pay | Admitting: Cardiology

## 2014-02-25 ENCOUNTER — Ambulatory Visit (HOSPITAL_COMMUNITY): Payer: Medicare Other | Attending: Cardiology | Admitting: Radiology

## 2014-02-25 ENCOUNTER — Ambulatory Visit (HOSPITAL_BASED_OUTPATIENT_CLINIC_OR_DEPARTMENT_OTHER): Payer: Medicare Other

## 2014-02-25 DIAGNOSIS — R0989 Other specified symptoms and signs involving the circulatory and respiratory systems: Secondary | ICD-10-CM | POA: Insufficient documentation

## 2014-02-25 DIAGNOSIS — E669 Obesity, unspecified: Secondary | ICD-10-CM | POA: Insufficient documentation

## 2014-02-25 DIAGNOSIS — I471 Supraventricular tachycardia, unspecified: Secondary | ICD-10-CM | POA: Insufficient documentation

## 2014-02-25 DIAGNOSIS — R0602 Shortness of breath: Secondary | ICD-10-CM

## 2014-02-25 DIAGNOSIS — R0609 Other forms of dyspnea: Secondary | ICD-10-CM | POA: Insufficient documentation

## 2014-02-25 DIAGNOSIS — R0600 Dyspnea, unspecified: Secondary | ICD-10-CM

## 2014-02-25 DIAGNOSIS — E785 Hyperlipidemia, unspecified: Secondary | ICD-10-CM | POA: Insufficient documentation

## 2014-02-25 DIAGNOSIS — Z6830 Body mass index (BMI) 30.0-30.9, adult: Secondary | ICD-10-CM | POA: Insufficient documentation

## 2014-02-25 NOTE — Progress Notes (Signed)
Echocardiogram performed.  

## 2014-02-25 NOTE — Progress Notes (Signed)
Stress Echocardiogram performed.  

## 2014-03-01 DIAGNOSIS — Z9889 Other specified postprocedural states: Secondary | ICD-10-CM | POA: Insufficient documentation

## 2014-03-01 DIAGNOSIS — E785 Hyperlipidemia, unspecified: Secondary | ICD-10-CM | POA: Insufficient documentation

## 2014-03-05 ENCOUNTER — Other Ambulatory Visit (HOSPITAL_COMMUNITY): Payer: Self-pay | Admitting: Family Medicine

## 2014-03-05 DIAGNOSIS — Z1231 Encounter for screening mammogram for malignant neoplasm of breast: Secondary | ICD-10-CM

## 2014-03-22 ENCOUNTER — Other Ambulatory Visit: Payer: Self-pay | Admitting: Family Medicine

## 2014-03-22 ENCOUNTER — Ambulatory Visit
Admission: RE | Admit: 2014-03-22 | Discharge: 2014-03-22 | Disposition: A | Discharge status: Home / Self Care | Payer: Medicare Other | Source: Ambulatory Visit | Attending: Family Medicine | Admitting: Family Medicine

## 2014-03-22 DIAGNOSIS — R0609 Other forms of dyspnea: Principal | ICD-10-CM

## 2014-03-24 ENCOUNTER — Ambulatory Visit (INDEPENDENT_AMBULATORY_CARE_PROVIDER_SITE_OTHER): Payer: Medicare Other | Admitting: Cardiology

## 2014-03-24 VITALS — BP 162/78 | HR 60 | Ht 66.0 in | Wt 183.0 lb

## 2014-03-24 DIAGNOSIS — R0609 Other forms of dyspnea: Secondary | ICD-10-CM

## 2014-03-24 DIAGNOSIS — I10 Essential (primary) hypertension: Secondary | ICD-10-CM

## 2014-03-24 DIAGNOSIS — I471 Supraventricular tachycardia: Secondary | ICD-10-CM

## 2014-03-24 DIAGNOSIS — R0989 Other specified symptoms and signs involving the circulatory and respiratory systems: Secondary | ICD-10-CM

## 2014-03-24 NOTE — Patient Instructions (Signed)
Your physician recommends that you schedule a follow-up appointment as needed with Dr McLean.  

## 2014-03-26 ENCOUNTER — Encounter: Payer: Self-pay | Admitting: Cardiology

## 2014-03-26 DIAGNOSIS — I10 Essential (primary) hypertension: Secondary | ICD-10-CM | POA: Insufficient documentation

## 2014-03-26 NOTE — Progress Notes (Signed)
Patient ID: Marie Ford, female   DOB: 1946/05/18, 68 y.o.   MRN: [id number] PCP: Dr. Duaine DredgeBlomgren  68 yo with history of SVT and hyperlipidemia presented initially for evaluation of exertional dyspnea.  In 12/14, patient had pneumonia. Since that time, she has noted exertional dyspnea.  She is short of breath walking up hills, up inclines, and up stairs.  No exertional chest pain.  She does get "twinges" of chest pain that last for a few seconds and resolve.  There is no clear trigger to these.  She walks her dog for a mile three times a week and does water aerobics three times a week.  She does not note dyspnea with these activities.  She has not had any recent tachypalpitations, SVT has been quiescent in recent years.  BP is elevated again in the office today.  I had her do a stress echocardiogram.  This showed good exercise tolerance and no evidence for ischemia.  There were no significant valvular abnormalities and the RV looked ok.  She did have a hypertensive BP response with exercise on the treadmill.   Labs (11/14): K 4.3, creatinine 0.85, LDL 98, HDL 57  PMH: 1. SVT: Quiescent. 2. Hyperlipidemia 3. Psoriasis 4. Stress echo in 3/09 was normal. 5. Carotid dopplers (4/13) normal 6. Diplopia with lateral gaze 7. Diverticulosis 8. Dyspnea: Stress echo (3/15) with 9' exercise, no regional wall motion abnormalities or ECG changes (no evidence for ischemia); there was a hypertensive BP response.  Echo (3/15) with EF 60-65%, grade I diastolic dysfunction, normal RV, no significant valvular abnormalities.   9. Suspect HTN  SH: Retired from [employer], nonsmoker, occasional ETOH.  She lives for 6 months in the summer in [place].   FH: No premature CAD.  Current Outpatient Prescriptions  Medication Sig Dispense Refill  . aspirin 81 MG tablet Take 81 mg by mouth daily.      Marland Kitchen. CALCIUM-VITAMIN D PO Take by mouth daily.      . Levofloxacin (LEVAQUIN PO) Take by mouth.      . Multiple  Vitamin (MULTIVITAMIN) tablet Take 1 tablet by mouth daily.      . rosuvastatin (CRESTOR) 40 MG tablet Take 80 mg by mouth daily.       No current facility-administered medications for this visit.    BP 162/78  Pulse 60  Ht 5\' 6"  (1.676 m)  Wt 83.008 kg (183 lb)  BMI 29.55 kg/m2 General: NAD Neck: No JVD, no thyromegaly or thyroid nodule.  Lungs: Clear to auscultation bilaterally with normal respiratory effort. CV: Nondisplaced PMI.  Heart regular S1/S2, no S3/S4, no murmur.  No peripheral edema.  No carotid bruit.  Normal pedal pulses.  Abdomen: Soft, nontender, no hepatosplenomegaly, no distention.  Skin: Intact without lesions or rashes.  Neurologic: Alert and oriented x 3.  Psych: Normal affect. Extremities: No clubbing or cyanosis.   Assessment/Plan: 1. Exertional dyspnea: With moderate to heavy exertion.  This has begun recently and has been most marked since her episode of PNA in 12/14.  Her ECG is normal.  I suspect this is most likely due to some deconditioning and possibly some degree of slow lung healing/scarring post-PNA.  Her stress echo showed no evidence for ischemia and her baseline echo showed normal LV and RV systolic function and no valvular abnormalities.  2. SVT: She has not had any recent runs of tachypalpitations.   3. Hyperlipidemia: Good lipids in 11/14 on Crestor.  4. Elevated BP: Patient has had elevated  BP on both visits to this office and had a hypertensive response to exercise on treadmill (SBP > 200).  I would like her to work on some weight loss, but suspect she will need pharmacologic treatment eventually.  She is going to get a home BP cuff and follow her BPs for Dr. Duaine DredgeBlomgren.   Laurey MoraleDalton S Chamberlain Steinborn 03/26/2014

## 2014-04-01 ENCOUNTER — Ambulatory Visit (HOSPITAL_COMMUNITY)
Admission: RE | Admit: 2014-04-01 | Discharge: 2014-04-01 | Disposition: A | Discharge status: Home / Self Care | Payer: Medicare Other | Source: Ambulatory Visit | Attending: Family Medicine | Admitting: Family Medicine

## 2014-04-01 DIAGNOSIS — Z1231 Encounter for screening mammogram for malignant neoplasm of breast: Secondary | ICD-10-CM | POA: Insufficient documentation

## 2016-01-31 ENCOUNTER — Ambulatory Visit
Admission: RE | Admit: 2016-01-31 | Discharge: 2016-01-31 | Disposition: A | Discharge status: Home / Self Care | Payer: Medicare Other | Source: Ambulatory Visit | Attending: Family Medicine | Admitting: Family Medicine

## 2016-01-31 ENCOUNTER — Other Ambulatory Visit: Payer: Self-pay | Admitting: Family Medicine

## 2016-01-31 DIAGNOSIS — T148XXA Other injury of unspecified body region, initial encounter: Secondary | ICD-10-CM

## 2016-01-31 DIAGNOSIS — M25552 Pain in left hip: Secondary | ICD-10-CM

## 2016-05-08 ENCOUNTER — Encounter: Payer: Self-pay | Admitting: Gastroenterology

## 2016-06-03 DIAGNOSIS — G4700 Insomnia, unspecified: Secondary | ICD-10-CM | POA: Insufficient documentation

## 2017-02-14 ENCOUNTER — Ambulatory Visit
Admission: RE | Admit: 2017-02-14 | Discharge: 2017-02-14 | Disposition: A | Discharge status: Home / Self Care | Payer: Medicare Other | Source: Ambulatory Visit | Attending: Family Medicine | Admitting: Family Medicine

## 2017-02-14 ENCOUNTER — Other Ambulatory Visit: Payer: Self-pay | Admitting: Family Medicine

## 2017-02-14 DIAGNOSIS — R0989 Other specified symptoms and signs involving the circulatory and respiratory systems: Secondary | ICD-10-CM

## 2017-02-14 DIAGNOSIS — R0609 Other forms of dyspnea: Secondary | ICD-10-CM

## 2017-02-14 DIAGNOSIS — E041 Nontoxic single thyroid nodule: Secondary | ICD-10-CM

## 2017-03-05 ENCOUNTER — Ambulatory Visit
Admission: RE | Admit: 2017-03-05 | Discharge: 2017-03-05 | Disposition: A | Discharge status: Home / Self Care | Payer: Medicare Other | Source: Ambulatory Visit | Attending: Family Medicine | Admitting: Family Medicine

## 2017-03-05 ENCOUNTER — Other Ambulatory Visit: Payer: Medicare Other

## 2017-03-05 DIAGNOSIS — E041 Nontoxic single thyroid nodule: Secondary | ICD-10-CM

## 2017-07-10 DIAGNOSIS — K219 Gastro-esophageal reflux disease without esophagitis: Secondary | ICD-10-CM | POA: Insufficient documentation

## 2017-07-10 DIAGNOSIS — M8580 Other specified disorders of bone density and structure, unspecified site: Secondary | ICD-10-CM | POA: Insufficient documentation

## 2017-07-10 DIAGNOSIS — K5790 Diverticulosis of intestine, part unspecified, without perforation or abscess without bleeding: Secondary | ICD-10-CM | POA: Insufficient documentation

## 2017-12-10 HISTORY — PX: OTHER SURGICAL HISTORY: SHX169

## 2018-01-28 DIAGNOSIS — H5022 Vertical strabismus, left eye: Secondary | ICD-10-CM | POA: Insufficient documentation

## 2018-10-01 ENCOUNTER — Ambulatory Visit
Admission: RE | Admit: 2018-10-01 | Discharge: 2018-10-01 | Disposition: A | Discharge status: Home / Self Care | Payer: Medicare Other | Source: Ambulatory Visit | Attending: Family Medicine | Admitting: Family Medicine

## 2018-10-01 ENCOUNTER — Other Ambulatory Visit: Payer: Self-pay | Admitting: Family Medicine

## 2018-10-01 DIAGNOSIS — R05 Cough: Secondary | ICD-10-CM

## 2018-10-01 DIAGNOSIS — R5383 Other fatigue: Secondary | ICD-10-CM

## 2018-10-01 DIAGNOSIS — R059 Cough, unspecified: Secondary | ICD-10-CM

## 2018-11-03 ENCOUNTER — Ambulatory Visit
Admission: RE | Admit: 2018-11-03 | Discharge: 2018-11-03 | Disposition: A | Discharge status: Home / Self Care | Payer: Medicare Other | Source: Ambulatory Visit | Attending: Family Medicine | Admitting: Family Medicine

## 2018-11-03 ENCOUNTER — Other Ambulatory Visit: Payer: Self-pay | Admitting: Family Medicine

## 2018-11-03 DIAGNOSIS — J189 Pneumonia, unspecified organism: Secondary | ICD-10-CM

## 2019-02-25 ENCOUNTER — Other Ambulatory Visit: Payer: Self-pay

## 2019-02-25 ENCOUNTER — Other Ambulatory Visit: Payer: Self-pay | Admitting: Family Medicine

## 2019-02-25 ENCOUNTER — Ambulatory Visit
Admission: RE | Admit: 2019-02-25 | Discharge: 2019-02-25 | Disposition: A | Discharge status: Home / Self Care | Payer: Medicare Other | Source: Ambulatory Visit | Attending: Family Medicine | Admitting: Family Medicine

## 2019-02-25 DIAGNOSIS — M25552 Pain in left hip: Secondary | ICD-10-CM

## 2019-02-25 DIAGNOSIS — M25652 Stiffness of left hip, not elsewhere classified: Secondary | ICD-10-CM

## 2019-03-10 ENCOUNTER — Other Ambulatory Visit: Payer: Self-pay

## 2019-03-10 ENCOUNTER — Ambulatory Visit: Payer: Medicare Other | Admitting: Podiatry

## 2019-03-10 ENCOUNTER — Encounter: Payer: Self-pay | Admitting: Podiatry

## 2019-03-10 VITALS — BP 109/78 | HR 86 | Temp 98.1°F

## 2019-03-10 DIAGNOSIS — M2012 Hallux valgus (acquired), left foot: Secondary | ICD-10-CM | POA: Diagnosis not present

## 2019-03-10 DIAGNOSIS — M2011 Hallux valgus (acquired), right foot: Secondary | ICD-10-CM | POA: Diagnosis not present

## 2019-03-10 DIAGNOSIS — R2689 Other abnormalities of gait and mobility: Secondary | ICD-10-CM

## 2019-03-10 DIAGNOSIS — Q828 Other specified congenital malformations of skin: Secondary | ICD-10-CM | POA: Diagnosis not present

## 2019-03-10 NOTE — Progress Notes (Signed)
Subjective: Marie Ford presents today referred by Dr. [name] for evaluation of painful lesion plantar aspect of left foot. She states lesion has been there about 1-2 months and has gotten progressively more painful when weightbearing with or without shoe gear.  She has secondary concern of b/l bunion deformity which have gotten worse over the past year or two. She relates her mother had bunions and she remembers her having surgery for them. She doesn't experience any limitations because of this and wears shoe gear that's sufficiently wide enough to accommodate her deformity. She has a few questions regarding padding/devices that may be used to prevent further progression of her bunion deformity.  Past Medical History:  Diagnosis Date  . Degenerative arthritis of hip    requirde surgery 2009  . Depression 2010  . Diplopia 2011   in left lateral gaze   . Diverticulosis 2006   noted in a colonoscopy  . Esophageal reflux 2013  . IGT (impaired glucose tolerance) 2011  . Insomnia 2009  . Osteoarthritis   . Osteopenia 2005  . Other and unspecified hyperlipidemia 2000  . Other specified disease of sebaceous glands   . Palpitations 2009   secondary to PSVT  . Psoriasis 2004  . PSVT (paroxysmal supraventricular tachycardia) (HCC) 2009  . Situational stress      Patient Active Problem List   Diagnosis Date Noted  . 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 Diverticular disease 07/10/2017  . Gastroesophageal reflux disease 07/10/2017  . Osteopenia 07/10/2017  . Insomnia 06/03/2016  . Essential hypertension 03/26/2014  . Hyperlipidemia 03/01/2014  . Other specified postprocedural states 03/01/2014  . Exertional dyspnea 02/07/2014  . 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 Diplopia 12/15/2013  . 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     Past Surgical History:  Procedure Laterality Date  . HEMORRHOID SURGERY    . TOTAL HIP ARTHROPLASTY Right 2010  . URETHRAL SLING      midurethral sling with TVT Exact and Cystoscopy      Current Outpatient Medications:  .  aspirin 81 MG tablet, Take 81 mg by mouth daily., Disp: , Rfl:  .  CALCIUM-VITAMIN D PO, Take by mouth daily., Disp: , Rfl:  .  chlorhexidine (PERIDEX) 0.12 % solution, SWISH 1 CAPFUL FOR 30 SECONDS THEN EXPECTORATE BID. BEGIN 1 WEEK PRIOR TO SURGERY, Disp: , Rfl:  .  ciprofloxacin (CIPRO) 500 MG tablet, TK 1 T PO QAM, Disp: , Rfl:  .  ezetimibe (ZETIA) 10 MG tablet, Take by mouth., Disp: , Rfl:  .  hydrocortisone (ANUSOL-HC) 25 MG suppository, Anusol-HC 25 mg rectal suppository  Insert 1 suppository every day by rectal route for 7 days., Disp: , Rfl:  .  meloxicam (MOBIC) 15 MG tablet, TK 1 T PO QD WF, Disp: , Rfl:  .  Multiple Vitamin (MULTIVITAMIN) tablet, Take 1 tablet by mouth daily., Disp: , Rfl:  .  nitrofurantoin, macrocrystal-monohydrate, (MACROBID) 100 MG capsule, Macrobid 100 mg capsule  Take 1 capsule every 12 hours by oral route., Disp: , Rfl:  .  pravastatin (PRAVACHOL) 80 MG tablet, pravastatin 80 mg tablet  Take 1 tablet every day by oral route., Disp: , Rfl:  .  predniSONE (DELTASONE) 10 MG tablet, TAKE 1 TABLET BY MOUTH BID PC FOR 1 WEEK THEN 1 QAM PC FOR 1 WEEK, Disp: , Rfl:  .  rosuvastatin (CRESTOR) 40 MG tablet, Take 80 mg by mouth daily., Disp: , Rfl:  .  triamterene-hydrochlorothiazide (DYAZIDE) 37.5-25 MG capsule, Take by mouth.,  Disp: , Rfl:    Allergies  Allergen Reactions  . Nitrofurantoin Other (See Comments)    Pass out  . Erythromycin   . Penicillins      Social History   Occupational History  . Not on file  Tobacco Use  . Smoking status: Never Smoker  Substance and Sexual Activity  . Alcohol use: Yes  . Drug use: No  . Sexual activity: Not on file     Family History  Problem Relation Age of Onset  . Congestive Heart Failure Mother   . CVA Father   . Heart failure Father   . Non-Hodgkin's lymphoma Sister   . Liver cancer Brother   . Colon cancer  Brother   . Colitis Brother   . Hypertension Brother   . Gout Brother   . Alcohol abuse Sister   . Melanoma Daughter      Immunization History  Administered Date(s) Administered  . Zoster Recombinat (Shingrix) 11/08/2017      Review of systems: Positive Findings in bold print.  Constitutional:  chills, fatigue, fever, sweats, weight change Communication: Nurse, learning disability, sign Presenter, broadcasting, hand writing, iPad/Android device Head: headaches, head injury Eyes: changes in vision, eye pain, glaucoma, cataracts, macular degeneration, diplopia, glare,  light sensitivity, eyeglasses or contacts, blindness Ears nose mouth throat: hearing impaired, hearing aids,  ringing in ears, deaf, sign language,  vertigo,   nosebleeds,  rhinitis,  cold sores, snoring, swollen glands Cardiovascular: HTN, edema, arrhythmia, pacemaker in place, defibrillator in place, chest pain/tightness, chronic anticoagulation, blood clot, heart failure, MI Peripheral Vascular: leg cramps, varicose veins, blood clots, lymphedema, varicosities Respiratory:  difficulty breathing, denies congestion, SOB, wheezing, cough, emphysema Gastrointestinal: change in appetite or weight, abdominal pain, constipation, diarrhea, nausea, vomiting, vomiting blood, change in bowel habits, abdominal pain, jaundice, rectal bleeding, hemorrhoids, GERD Genitourinary:  nocturia,  pain on urination, polyuria,  blood in urine, Foley catheter, urinary urgency, ESRD on hemodialysis Musculoskeletal: amputation, cramping, stiff joints, painful joints, decreased joint motion, fractures, OA, gout, hemiplegia, paraplegia, uses cane, wheelchair bound, uses walker, uses rollator Skin: +changes in toenails, color change, dryness, itching, mole changes,  rash, wound(s) Neurological: headaches, numbness in feet, paresthesias in feet, burning in feet, fainting,  seizures, change in speech. denies headaches, memory problems/poor historian, cerebral palsy,  weakness, paralysis, CVA, TIA Endocrine: diabetes, hypothyroidism, hyperthyroidism,  goiter, dry mouth, flushing, heat intolerance,  cold intolerance,  excessive thirst, denies polyuria,  nocturia Hematological:  easy bleeding, excessive bleeding, easy bruising, enlarged lymph nodes, on long term blood thinner, history of past transusions Allergy/immunological:  hives, eczema, frequent infections, multiple drug allergies, seasonal allergies, transplant recipient, multiple food allergies Psychiatric:  anxiety, depression, mood disorder, suicidal ideations, hallucinations, insomnia  Objective: Vitals:   03/10/19 0845  BP: 109/78  Pulse: 86   Vascular Examination: Capillary refill time immediate  x 10 digits  Dorsalis pedis and posterior tibial pulses present b/l  Digital hair present  x 10 digits  Skin temperature WNL b/l  Dermatological Examination: Skin with normal turgor, texture and tone b/l  Toenails 1-5 b/l are well pedicured and painted with nail polish.  Porokeratotic lesions submet head 5 left foot with tenderness to palpation. No erythema, no edema, no drainage, no flocculence.  Musculoskeletal: Muscle strength 5/5 to all LE muscle groups  Neurological: Sensation intact 5/5 b/l with 10 gram monofilament.  HAV with bunion deformity b/l.  Vibratory sensation intact b/l.  Assessment: 1. Painful porokeratosis submet head 5 left foot 2. Antalgic gait 3. HAV with  bunion deformity b/l  Plan: 1. Discussed diagnoses and treatment options available. Discussed conservative options for bunion deformity such as bunion splint, bunion padding and continued wide shoe gear. Also discussed orthotic therapy to control pronation to prevent further progression of bunion deformity. Will check insurance for orthotics benefits. 2. Discussed surgical options and some procedures for bunion correction. When she is ready, will refer her to one of our surgeons to further discuss this  option. 3. Patient to continue soft, supportive shoe gear daily. 4. Patient to report any pedal injuries to medical professional immediately. 5. Follow up 3 months.  6. Patient/POA to call should there be a concern in the interim.

## 2019-03-10 NOTE — Patient Instructions (Addendum)
Corns and Calluses Corns are small areas of thickened skin that occur on the top, sides, or tip of a toe. They contain a cone-shaped core with a point that can press on a nerve below. This causes pain.  Calluses are areas of thickened skin that can occur anywhere on the body, including the hands, fingers, palms, soles of the feet, and heels. Calluses are usually larger than corns. What are the causes? Corns and calluses are caused by rubbing (friction) or pressure, such as from shoes that are too tight or do not fit properly. What increases the risk? Corns are more likely to develop in people who have misshapen toes (toe deformities), such as hammer toes. Calluses can occur with friction to any area of the skin. They are more likely to develop in people who:  Work with their hands.  Wear shoes that fit poorly, are too tight, or are high-heeled.  Have toe deformities. What are the signs or symptoms? Symptoms of a corn or callus include:  A hard growth on the skin.  Pain or tenderness under the skin.  Redness and swelling.  Increased discomfort while wearing tight-fitting shoes, if your feet are affected. If a corn or callus becomes infected, symptoms may include:  Redness and swelling that gets worse.  Pain.  Fluid, blood, or pus draining from the corn or callus. How is this diagnosed? Corns and calluses may be diagnosed based on your symptoms, your medical history, and a physical exam. How is this treated? Treatment for corns and calluses may include:  Removing the cause of the friction or pressure. This may involve: ? Changing your shoes. ? Wearing shoe inserts (orthotics) or other protective layers in your shoes, such as a corn pad. ? Wearing gloves.  Applying medicine to the skin (topical medicine) to help soften skin in the hardened, thickened areas.  Removing layers of dead skin with a file to reduce the size of the corn or callus.  Removing the corn or callus with a  scalpel or laser.  Taking antibiotic medicines, if your corn or callus is infected.  Having surgery, if a toe deformity is the cause. Follow these instructions at home:   Take over-the-counter and prescription medicines only as told by your health care provider.  If you were prescribed an antibiotic, take it as told by your health care provider. Do not stop taking it even if your condition starts to improve.  Wear shoes that fit well. Avoid wearing high-heeled shoes and shoes that are too tight or too loose.  Wear any padding, protective layers, gloves, or orthotics as told by your health care provider.  Soak your hands or feet and then use a file or pumice stone to soften your corn or callus. Do this as told by your health care provider.  Check your corn or callus every day for symptoms of infection. Contact a health care provider if you:  Notice that your symptoms do not improve with treatment.  Have redness or swelling that gets worse.  Notice that your corn or callus becomes painful.  Have fluid, blood, or pus coming from your corn or callus.  Have new symptoms. Summary  Corns are small areas of thickened skin that occur on the top, sides, or tip of a toe.  Calluses are areas of thickened skin that can occur anywhere on the body, including the hands, fingers, palms, and soles of the feet. Calluses are usually larger than corns.  Corns and calluses are caused by   rubbing (friction) or pressure, such as from shoes that are too tight or do not fit properly.  Treatment may include wearing any padding, protective layers, gloves, or orthotics as told by your health care provider. This information is not intended to replace advice given to you by your health care provider. Make sure you discuss any questions you have with your health care provider. Document Released: 09/01/2004 Document Revised: 10/09/2017 Document Reviewed: 10/09/2017 Elsevier Interactive Patient Education   2019 [employer]  A bunion is a bump on the base of the big toe that forms when the bones of the big toe joint move out of position. Bunions may be small at first, but they often get larger over time. They can make walking painful. What are the causes? A bunion may be caused by:  Wearing narrow or pointed shoes that force the big toe to press against the other toes.  Abnormal foot development that causes the foot to roll inward (pronate).  Changes in the foot that are caused by certain diseases, such as rheumatoid arthritis or polio.  A foot injury. What increases the risk? The following factors may make you more likely to develop this condition:  Wearing shoes that squeeze the toes together.  Having certain diseases, such as: ? Rheumatoid arthritis. ? Polio. ? Cerebral palsy.  Having family members who have bunions.  Being born with a foot deformity, such as flat feet or low arches.  Doing activities that put a lot of pressure on the feet, such as ballet dancing. What are the signs or symptoms? The main symptom of a bunion is a noticeable bump on the big toe. Other symptoms may include:  Pain.  Swelling around the big toe.  Redness and inflammation.  Thick or hardened skin on the big toe or between the toes.  Stiffness or loss of motion in the big toe.  Trouble with walking. How is this diagnosed? A bunion may be diagnosed based on your symptoms, medical history, and activities. You may have tests, such as:  X-rays. These allow your health care provider to check the position of the bones in your foot and look for damage to your joint. They also help your health care provider determine the severity of your bunion and the best way to treat it.  Joint aspiration. In this test, a sample of fluid is removed from the toe joint. This test may be done if you are in a lot of pain. It helps rule out diseases that cause painful swelling of the joints, such as  arthritis. How is this treated? Treatment depends on the severity of your symptoms. The goal of treatment is to relieve symptoms and prevent the bunion from getting worse. Your health care provider may recommend:  Wearing shoes that have a wide toe box.  Using bunion pads to cushion the affected area.  Taping your toes together to keep them in a normal position.  Placing a device inside your shoe (orthotics) to help reduce pressure on your toe joint.  Taking medicine to ease pain, inflammation, and swelling.  Applying heat or ice to the affected area.  Doing stretching exercises.  Surgery to remove scar tissue and move the toes back into their normal position. This treatment is rare. Follow these instructions at home: Managing pain, stiffness, and swelling   If directed, put ice on the painful area: ? Put ice in a plastic bag. ? Place a towel between your skin and the bag. ?  Leave the ice on for 20 minutes, 2-3 times a day. Activity   If directed, apply heat to the affected area before you exercise. Use the heat source that your health care provider recommends, such as a moist heat pack or a heating pad. ? Place a towel between your skin and the heat source. ? Leave the heat on for 20-30 minutes. ? Remove the heat if your skin turns bright red. This is especially important if you are unable to feel pain, heat, or cold. You may have a greater risk of getting burned.  Do exercises as told by your health care provider. General instructions  Support your toe joint with proper footwear, shoe padding, or taping as told by your health care provider.  Take over-the-counter and prescription medicines only as told by your health care provider.  Keep all follow-up visits as told by your health care provider. This is important. Contact a health care provider if your symptoms:  Get worse.  Do not improve in 2 weeks. Get help right away if you have:  Severe pain and trouble with  walking. Summary  A bunion is a bump on the base of the big toe that forms when the bones of the big toe joint move out of position.  Bunions can make walking painful.  Treatment depends on the severity of your symptoms.  Support your toe joint with proper footwear, shoe padding, or taping as told by your health care provider. This information is not intended to replace advice given to you by your health care provider. Make sure you discuss any questions you have with your health care provider. Document Released: 11/26/2005 Document Revised: 04/08/2018 Document Reviewed: 04/08/2018 Elsevier Interactive Patient Education  2019 [employer].

## 2019-03-18 ENCOUNTER — Telehealth: Payer: Self-pay | Admitting: Podiatry

## 2019-03-18 NOTE — Telephone Encounter (Signed)
Left message for pt that orthotics are not covered by medicare and the cost is 398.00. We can do payment plan with 100.00 down. Call if any further questions or to schedule an appt.

## 2019-08-12 ENCOUNTER — Encounter (HOSPITAL_COMMUNITY)
Admission: RE | Admit: 2019-08-12 | Discharge: 2019-08-12 | Disposition: A | Discharge status: Home / Self Care | Payer: Medicare Other | Source: Ambulatory Visit | Attending: Orthopedic Surgery | Admitting: Orthopedic Surgery

## 2019-08-12 ENCOUNTER — Encounter (HOSPITAL_COMMUNITY): Payer: Self-pay

## 2019-08-12 ENCOUNTER — Other Ambulatory Visit: Payer: Self-pay

## 2019-08-12 DIAGNOSIS — Z20828 Contact with and (suspected) exposure to other viral communicable diseases: Secondary | ICD-10-CM | POA: Insufficient documentation

## 2019-08-12 DIAGNOSIS — M1612 Unilateral primary osteoarthritis, left hip: Secondary | ICD-10-CM | POA: Insufficient documentation

## 2019-08-12 DIAGNOSIS — Z01812 Encounter for preprocedural laboratory examination: Secondary | ICD-10-CM | POA: Diagnosis present

## 2019-08-12 HISTORY — DX: Venous insufficiency (chronic) (peripheral): I87.2

## 2019-08-12 LAB — COMPREHENSIVE METABOLIC PANEL
ALT: 14 U/L (ref 0–44)
AST: 21 U/L (ref 15–41)
Albumin: 4 g/dL (ref 3.5–5.0)
Alkaline Phosphatase: 63 U/L (ref 38–126)
Anion gap: 11 (ref 5–15)
BUN: 28 mg/dL — ABNORMAL HIGH (ref 8–23)
CO2: 30 mmol/L (ref 22–32)
Calcium: 10.2 mg/dL (ref 8.9–10.3)
Chloride: 100 mmol/L (ref 98–111)
Creatinine, Ser: 1.46 mg/dL — ABNORMAL HIGH (ref 0.44–1.00)
GFR calc Af Amer: 41 mL/min — ABNORMAL LOW (ref >60)
GFR calc non Af Amer: 35 mL/min — ABNORMAL LOW (ref >60)
Glucose, Bld: 105 mg/dL — ABNORMAL HIGH (ref 70–99)
Potassium: 4.5 mmol/L (ref 3.5–5.1)
Sodium: 141 mmol/L (ref 135–145)
Total Bilirubin: 1.3 mg/dL — ABNORMAL HIGH (ref 0.3–1.2)
Total Protein: 7.6 g/dL (ref 6.5–8.1)

## 2019-08-12 LAB — CBC
HCT: 44.8 % (ref 36.0–46.0)
Hemoglobin: 14.9 g/dL (ref 12.0–15.0)
MCH: 31.4 pg (ref 26.0–34.0)
MCHC: 33.3 g/dL (ref 30.0–36.0)
MCV: 94.5 fL (ref 80.0–100.0)
Platelets: 444 10*3/uL — ABNORMAL HIGH (ref 150–400)
RBC: 4.74 MIL/uL (ref 3.87–5.11)
RDW: 12 % (ref 11.5–15.5)
WBC: 8.1 10*3/uL (ref 4.0–10.5)
nRBC: 0 % (ref 0.0–0.2)

## 2019-08-12 LAB — SURGICAL PCR SCREEN
MRSA, PCR: NEGATIVE
Staphylococcus aureus: NEGATIVE

## 2019-08-12 LAB — PROTIME-INR
INR: 1 (ref 0.8–1.2)
Prothrombin Time: 13.2 seconds (ref 11.4–15.2)

## 2019-08-12 LAB — APTT: aPTT: 33 seconds (ref 24–36)

## 2019-08-12 NOTE — H&P (Signed)
TOTAL HIP ADMISSION H&P  Patient is admitted for left total hip arthroplasty.  Subjective:  Chief Complaint: left hip pain  HPI: Marie Ford, 73 y.o. female, has a history of pain and functional disability in the left hip(s) due to arthritis and patient has failed non-surgical conservative treatments for greater than 12 weeks to include NSAID's and/or analgesics, corticosteriod injections, flexibility and strengthening excercises, use of assistive devices and activity modification.  Onset of symptoms was gradual starting 3 years ago with gradually worsening course since that time.The patient noted no past surgery on the left hip(s).  Patient currently rates pain in the left hip at 7 out of 10 with activity. Patient has night pain, worsening of pain with activity and weight bearing, pain that interfers with activities of daily living and pain with passive range of motion. Patient has evidence of subchondral cysts, periarticular osteophytes and joint space narrowing by imaging studies. This condition presents safety issues increasing the risk of falls. There is no current active infection.  Patient Active Problem List   Diagnosis Date Noted  . 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 Diverticular disease 07/10/2017  . Gastroesophageal reflux disease 07/10/2017  . Osteopenia 07/10/2017  . Insomnia 06/03/2016  . Essential hypertension 03/26/2014  . Hyperlipidemia 03/01/2014  . Other specified postprocedural states 03/01/2014  . Exertional dyspnea 02/07/2014  . 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 Diplopia 12/15/2013  . 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 Degenerative arthritis of hip    requirde surgery 2009  . Depression 2010  . Diplopia 2011   in left lateral gaze   . Diverticulosis 2006   noted in a colonoscopy  . Esophageal reflux 2013  . IGT (impaired glucose tolerance) 2011  . Insomnia 2009  . Osteoarthritis   .  Osteopenia 2005  . Other and unspecified hyperlipidemia 2000  . Other specified disease of sebaceous glands   . Palpitations 2009   secondary to PSVT  . PNA (pneumonia) 2014  . Psoriasis 2004  . PSVT (paroxysmal supraventricular tachycardia) ([place]) 2009  . Situational stress   . Venous insufficiency since 2017    Past Surgical History:  Procedure Laterality Date  . CATARACT EXTRACTION W/ INTRAOCULAR LENS  IMPLANT, BILATERAL    . HEMORRHOID SURGERY    . LASIK    . left medial and superior rectus muscle recesion for diplopia correction    . surgery of right eye rectus muscles for diplpia correction   2019  . TOTAL HIP ARTHROPLASTY Right 2010  . URETHRAL SLING     midurethral sling with TVT Exact and Cystoscopy       Current Outpatient Medications  Medication Sig Dispense Refill Last Dose  . Calcium Carbonate-Vitamin D3 (CALCIUM 600+D3) 600-400 MG-UNIT TABS Take 1 tablet by mouth daily.     Marland Kitchen ezetimibe (ZETIA) 10 MG tablet Take 10 mg by mouth daily.      . fluticasone (FLONASE) 50 MCG/ACT nasal spray Place 1 spray into both nostrils daily.     . meloxicam (MOBIC) 15 MG tablet Take 15 mg by mouth daily as needed (pain.).      Marland Kitchen Multiple Vitamin (MULTIVITAMIN WITH MINERALS) TABS tablet Take 1 tablet by mouth daily. Women's Multivitamin     . omeprazole (PRILOSEC) 20 MG capsule Take 20 mg by mouth daily as needed (with meloxicam use).     Vladimir Faster Glycol-Propyl Glycol (SYSTANE) 0.4-0.3 % SOLN Place 1 drop into both eyes 3 (three)  times daily as needed (dry/irritated eyes.).     Marland Kitchen pravastatin (PRAVACHOL) 80 MG tablet Take 80 mg by mouth every evening.      . triamterene-hydrochlorothiazide (MAXZIDE-25) 37.5-25 MG tablet Take 1 tablet by mouth daily.     Marland Kitchen trimethoprim (TRIMPEX) 100 MG tablet Take 100 mg by mouth daily.      Allergies  Allergen Reactions  . Erythromycin Shortness Of Breath and Rash  . Nitrofurantoin Other (See Comments)    Pass out  . Penicillins Rash    Did it  involve swelling of the face/tongue/throat, SOB, or low BP? No Did it involve sudden or severe rash/hives, skin peeling, or any reaction on the inside of your mouth or nose? No Did you need to seek medical attention at a hospital or doctor's office? No When did it last happen? More than 10 years ago If all above answers are "NO", may proceed with cephalosporin use.     Social History   Tobacco Use  . Smoking status: Never Smoker  . Smokeless tobacco: Never Used  Substance Use Topics  . Alcohol use: Yes    Family History  Problem Relation Age of Onset  . Congestive Heart Failure Mother   . CVA Father   . Heart failure Father   . Liver cancer Brother   . Colon cancer Brother   . Colitis Brother   . Non-Hodgkin's lymphoma Sister   . Hypertension Brother   . Gout Brother   . Alcohol abuse Sister   . Melanoma Daughter      ROS  Objective:  Physical Exam  Vital signs in last 24 hours: Temp:  [98.6 F (37 C)] 98.6 F (37 C) (09/02 1238) Pulse Rate:  [81] 81 (09/02 1238) Resp:  [18] 18 (09/02 1238) BP: (112)/(80) 112/80 (09/02 1238) SpO2:  [97 %] 97 % (09/02 1238) Weight:  [73.2 kg] 73.2 kg (09/02 1238)  Labs:   Estimated body mass index is 26.04 kg/m as calculated from the following:   Height as of 08/12/19: 5\' 6"  (1.676 m).   Weight as of 08/12/19: 73.2 kg.   Imaging Review Plain radiographs demonstrate severe degenerative joint disease of the left hip(s). The bone quality appears to be good for age and reported activity level.    Assessment/Plan:  End stage primary osteoarthritis, left hip(s)  The patient history, physical examination, clinical judgement of the provider and imaging studies are consistent with end stage degenerative joint disease of the left hip(s) and total hip arthroplasty is deemed medically necessary. The treatment options including medical management, injection therapy, arthroscopy and arthroplasty were discussed at length. The risks and  benefits of total hip arthroplasty were presented and reviewed. The risks due to aseptic loosening, infection, stiffness, dislocation/subluxation,  thromboembolic complications and other imponderables were discussed.  The patient acknowledged the explanation, agreed to proceed with the plan and consent was signed. Patient is being admitted for inpatient treatment for surgery, pain control, PT, OT, prophylactic antibiotics, VTE prophylaxis, progressive ambulation and ADL's and discharge planning.The patient is planning to be discharged home with HEP.   Anticipated LOS equal to or greater than 2 midnights due to - Age 52 and older with one or more of the following:  - Obesity  - Expected need for hospital services (PT, OT, Nursing) required for safe  discharge  - Anticipated need for postoperative skilled nursing care or inpatient rehab  - Active co-morbidities: Cardiac Arrhythmia OR   - Unanticipated findings during/Post Surgery: None  -  Patient is a high risk of re-admission due to: None    Risks and benefits of the surgery were discussed with the patient and Dr.Aluisio at their previous office visit, and the patient has elected to move forward with the aforementioned surgery. Post-operative care plans were discussed with the patient today.  Therapy Plans: HEP vs HHPT Disposition: Home with friend (has not made arrangements yet) Planned DVT prophylaxis: aspirin 325mg  BID DME needed: rolling walker and 3-n-1 PCP: Dr. Mosetta PuttPeter Blomgren  Other: no anesthesia concerns  Instructed patient on meds to stop prior to surgery  Questions and concerns were welcomed and addressed. If they have any further questions or concerns that arise, they are instructed to call the office at any time.  Marie PedAmber Demitri Kucinski, PA-C

## 2019-08-12 NOTE — Progress Notes (Signed)
PCP -  [facility] Cardiologist -   Chest x-ray - 2019 Epic  EKG - 03-25-2019 ON CHART , 02-10-2019 ON CHART  Stress Test -  ECHO -  Cardiac Cath -   Sleep Study -  CPAP -   Fasting Blood Sugar -  Checks Blood Sugar _____ times a day  Blood Thinner Instructions: Aspirin Instructions: Last Dose:  Anesthesia review:    ELEVATED BUN AT PAT APPT  OF 28  Patient denies shortness of breath, fever, cough and chest pain at PAT appointment   Patient verbalized understanding of instructions that were given to them at the PAT appointment. Patient was also instructed that they will need to review over the PAT instructions again at home before surgery.

## 2019-08-12 NOTE — Patient Instructions (Addendum)
DUE TO COVID-19 ONLY ONE VISITOR IS ALLOWED TO COME WITH YOU AND STAY IN THE WAITING ROOM ONLY DURING PRE OP AND PROCEDURE DAY OF SURGERY. THE 1 VISITOR MAY VISIT WITH YOU AFTER SURGERY IN YOUR PRIVATE ROOM DURING VISITING HOURS ONLY!  YOU NEED TO HAVE A COVID 19 TEST ON___Saturday 9-5-2020___ @___10 :00am____, THIS TEST MUST BE DONE BEFORE SURGERY, COME  [facility] , [id number].  ([place]) ONCE YOUR COVID TEST IS COMPLETED, PLEASE BEGIN THE QUARANTINE INSTRUCTIONS AS OUTLINED IN YOUR HANDOUT.                Marie Ford    Your procedure is scheduled on: 08-19-2019   Report to [facility] Main  Entrance   Report to admitting at 6:30AM     Call this number if you have problems the morning of surgery [place], NO CHEWING GUM [place].   Do not eat food After Midnight. YOU MAY HAVE CLEAR LIQUIDS FROM MIDNIGHT UNTIL 5:30AM. At 5:30AM Please finish the prescribed Pre-Surgery ENSURE drink. Nothing by mouth after you finish the ENSURE drink !   CLEAR LIQUID DIET   Foods Allowed                                                                     Foods Excluded  Coffee and tea, regular and decaf                             liquids that you cannot  Plain Jell-O any favor except red or purple                                           see through such as: Fruit ices (not with fruit pulp)                                     milk, soups, orange juice  Iced Popsicles                                    All solid food Carbonated beverages, regular and diet                                    Cranberry, grape and apple juices Sports drinks like Gatorade Lightly seasoned clear broth or consume(fat free) Sugar, honey syrup  Sample Menu Breakfast                                Lunch  Supper Cranberry juice                    Beef broth                             Chicken broth Jell-O                                     Grape juice                           Apple juice Coffee or tea                        Jell-O                                      Popsicle                                                Coffee or tea                        Coffee or tea  _____________________________________________________________________    Take these medicines the morning of surgery with A SIP OF WATER: Zetia, Flonase nasal spray , Omeprazole if needed                                 You may not have any metal on your body including hair pins and              piercings  Do not wear jewelry, make-up, lotions, powders or perfumes, deodorant             Do not wear nail polish.  Do not shave  48 hours prior to surgery.                Do not bring valuables to the hospital. [facility] IS NOT             RESPONSIBLE   FOR VALUABLES.  Contacts, dentures or bridgework may not be worn into surgery.                Please read over the following fact sheets you were given: _____________________________________________________________________             [facility] Pre[facility] Before surgery, you can play an important role.  Because skin is not sterile, your skin needs to be as free of germs as possible.  You can reduce the number of germs on your skin by washing with CHG (chlorahexidine gluconate) soap before surgery.  CHG is an antiseptic cleaner which kills germs and bonds with the skin to continue killing germs even after washing. Please DO NOT use if you have an allergy to CHG or antibacterial soaps.  If your skin becomes reddened/irritated stop using the CHG and inform your nurse when you arrive at Short Stay. Do not shave (including legs and underarms) for at least 48 hours prior to the first CHG shower.  You may shave your face/neck. Please follow these instructions carefully:  1.  Shower with CHG Soap the night before surgery and  the  morning of Surgery.  2.  If you choose to wash your hair, wash your hair first as usual with your  normal  shampoo.  3.  After you shampoo, rinse your hair and body thoroughly to remove the  shampoo.                           4.  Use CHG as you would any other liquid soap.  You can apply chg directly  to the skin and wash                       Gently with a scrungie or clean washcloth.  5.  Apply the CHG Soap to your body ONLY FROM THE NECK DOWN.   Do not use on face/ open                           Wound or open sores. Avoid contact with eyes, ears mouth and genitals (private parts).                       Wash face,  Genitals (private parts) with your normal soap.             6.  Wash thoroughly, paying special attention to the area where your surgery  will be performed.  7.  Thoroughly rinse your body with warm water from the neck down.  8.  DO NOT shower/wash with your normal soap after using and rinsing off  the CHG Soap.                9.  Pat yourself dry with a clean towel.            10.  Wear clean pajamas.            11.  Place clean sheets on your bed the night of your first shower and do not  sleep with pets. Day of Surgery : Do not apply any lotions/deodorants the morning of surgery.  Please wear clean clothes to the hospital/surgery center.  FAILURE TO FOLLOW THESE INSTRUCTIONS MAY RESULT IN THE CANCELLATION OF YOUR SURGERY PATIENT SIGNATURE_________________________________  NURSE SIGNATURE__________________________________  ________________________________________________________________________   Marie Ford  An incentive spirometer is a tool that can help keep your lungs clear and active. This tool measures how well you are filling your lungs with each breath. Taking long deep breaths may help reverse or decrease the chance of developing breathing (pulmonary) problems (especially infection) following:  A long period of time when you are unable to move or be  active. BEFORE THE PROCEDURE   If the spirometer includes an indicator to show your best effort, your nurse or respiratory therapist will set it to a desired goal.  If possible, sit up straight or lean slightly forward. Try not to slouch.  Hold the incentive spirometer in an upright position. INSTRUCTIONS FOR USE  1. Sit on the edge of your bed if possible, or sit up as far as you can in bed or on a chair. 2. Hold the incentive spirometer in an upright position. 3. Breathe out normally. 4. Place the mouthpiece in your mouth and seal your lips tightly around it. 5. Breathe in slowly and as deeply as possible,  raising the piston or the ball toward the top of the column. 6. Hold your breath for 3-5 seconds or for as long as possible. Allow the piston or ball to fall to the bottom of the column. 7. Remove the mouthpiece from your mouth and breathe out normally. 8. Rest for a few seconds and repeat Steps 1 through 7 at least 10 times every 1-2 hours when you are awake. Take your time and take a few normal breaths between deep breaths. 9. The spirometer may include an indicator to show your best effort. Use the indicator as a goal to work toward during each repetition. 10. After each set of 10 deep breaths, practice coughing to be sure your lungs are clear. If you have an incision (the cut made at the time of surgery), support your incision when coughing by placing a pillow or rolled up towels firmly against it. Once you are able to get out of bed, walk around indoors and cough well. You may stop using the incentive spirometer when instructed by your caregiver.  RISKS AND COMPLICATIONS  Take your time so you do not get dizzy or light-headed.  If you are in pain, you may need to take or ask for pain medication before doing incentive spirometry. It is harder to take a deep breath if you are having pain. AFTER USE  Rest and breathe slowly and easily.  It can be helpful to keep track of a log of  your progress. Your caregiver can provide you with a simple table to help with this. If you are using the spirometer at home, follow these instructions: [place] IF:   You are having difficultly using the spirometer.  You have trouble using the spirometer as often as instructed.  Your pain medication is not giving enough relief while using the spirometer.  You develop fever of 100.5 F (38.1 C) or higher. SEEK IMMEDIATE MEDICAL CARE IF:   You cough up bloody sputum that had not been present before.  You develop fever of 102 F (38.9 C) or greater.  You develop worsening pain at or near the incision site. MAKE SURE YOU:   Understand these instructions.  Will watch your condition.  Will get help right away if you are not doing well or get worse. Document Released: 04/08/2007 Document Revised: 02/18/2012 Document Reviewed: 06/09/2007 Exit[employer] Patient Information 2014 [employer], [place].   ________________________________________________________________________  WHAT IS A BLOOD TRANSFUSION? Blood Transfusion Information  A transfusion is the replacement of blood or some of its parts. Blood is made up of multiple cells which provide different functions.  Red blood cells carry oxygen and are used for blood loss replacement.  White blood cells fight against infection.  Platelets control bleeding.  Plasma helps clot blood.  Other blood products are available for specialized needs, such as hemophilia or other clotting disorders. BEFORE THE TRANSFUSION  Who gives blood for transfusions?   Healthy volunteers who are fully evaluated to make sure their blood is safe. This is blood bank blood. Transfusion therapy is the safest it has ever been in the practice of medicine. Before blood is taken from a donor, a complete history is taken to make sure that person has no history of diseases nor engages in risky social behavior (examples are intravenous drug use or sexual activity  with multiple partners). The donor's travel history is screened to minimize risk of transmitting infections, such as malaria. The donated blood is tested for signs of infectious diseases, such as  HIV and hepatitis. The blood is then tested to be sure it is compatible with you in order to minimize the chance of a transfusion reaction. If you or a relative donates blood, this is often done in anticipation of surgery and is not appropriate for emergency situations. It takes many days to process the donated blood. RISKS AND COMPLICATIONS Although transfusion therapy is very safe and saves many lives, the main dangers of transfusion include:   Getting an infectious disease.  Developing a transfusion reaction. This is an allergic reaction to something in the blood you were given. Every precaution is taken to prevent this. The decision to have a blood transfusion has been considered carefully by your caregiver before blood is given. Blood is not given unless the benefits outweigh the risks. AFTER THE TRANSFUSION  Right after receiving a blood transfusion, you will usually feel much better and more energetic. This is especially true if your red blood cells have gotten low (anemic). The transfusion raises the level of the red blood cells which carry oxygen, and this usually causes an energy increase.  The nurse administering the transfusion will monitor you carefully for complications. HOME CARE INSTRUCTIONS  No special instructions are needed after a transfusion. You may find your energy is better. Speak with your caregiver about any limitations on activity for underlying diseases you may have. SEEK MEDICAL CARE IF:   Your condition is not improving after your transfusion.  You develop redness or irritation at the intravenous (IV) site. SEEK IMMEDIATE MEDICAL CARE IF:  Any of the following symptoms occur over the next 12 hours:  Shaking chills.  You have a temperature by mouth above 102 F (38.9  C), not controlled by medicine.  Chest, back, or muscle pain.  People around you feel you are not acting correctly or are confused.  Shortness of breath or difficulty breathing.  Dizziness and fainting.  You get a rash or develop hives.  You have a decrease in urine output.  Your urine turns a dark color or changes to pink, red, or brown. Any of the following symptoms occur over the next 10 days:  You have a temperature by mouth above 102 F (38.9 C), not controlled by medicine.  Shortness of breath.  Weakness after normal activity.  The white part of the eye turns yellow (jaundice).  You have a decrease in the amount of urine or are urinating less often.  Your urine turns a dark color or changes to pink, red, or brown. Document Released: 11/23/2000 Document Revised: 02/18/2012 Document Reviewed: 07/12/2008 [facility] Patient Information 2014 [place], [place].  _______________________________________________________________________

## 2019-08-15 ENCOUNTER — Other Ambulatory Visit (HOSPITAL_COMMUNITY)
Admission: RE | Admit: 2019-08-15 | Discharge: 2019-08-15 | Disposition: A | Discharge status: Home / Self Care | Payer: Medicare Other | Source: Ambulatory Visit | Attending: Orthopedic Surgery | Admitting: Orthopedic Surgery

## 2019-08-15 DIAGNOSIS — Z01812 Encounter for preprocedural laboratory examination: Secondary | ICD-10-CM | POA: Diagnosis not present

## 2019-08-16 LAB — NOVEL CORONAVIRUS, NAA (HOSP ORDER, SEND-OUT TO REF LAB; TAT 18-24 HRS): SARS-CoV-2, NAA: NOT DETECTED

## 2019-08-18 ENCOUNTER — Encounter (HOSPITAL_COMMUNITY): Payer: Self-pay | Admitting: Anesthesiology

## 2019-08-18 NOTE — Anesthesia Preprocedure Evaluation (Addendum)
Anesthesia Evaluation  Patient identified by MRN, date of birth, ID band Patient awake    Reviewed: Allergy & Precautions, NPO status , Patient's Chart, lab work & pertinent test results  Airway Mallampati: I       Dental no notable dental hx. (+) Teeth Intact   Pulmonary    Pulmonary exam normal breath sounds clear to auscultation       Cardiovascular hypertension, Pt. on medications Normal cardiovascular exam Rhythm:Regular Rate:Normal     Neuro/Psych Depression negative neurological ROS     GI/Hepatic Neg liver ROS, GERD  Medicated and Controlled,  Endo/Other    Renal/GU Renal InsufficiencyRenal disease     Musculoskeletal  (+) Arthritis , Osteoarthritis,    Abdominal Normal abdominal exam  (+)   Peds  Hematology   Anesthesia Other Findings   Reproductive/Obstetrics                            Anesthesia Physical Anesthesia Plan  ASA: II  Anesthesia Plan: Spinal   Post-op Pain Management:    Induction:   PONV Risk Score and Plan: 2 and Ondansetron and Dexamethasone  Airway Management Planned: Nasal Cannula, Natural Airway and Simple Face Mask  Additional Equipment:   Intra-op Plan:   Post-operative Plan:   Informed Consent: I have reviewed the patients History and Physical, chart, labs and discussed the procedure including the risks, benefits and alternatives for the proposed anesthesia with the patient or authorized representative who has indicated his/her understanding and acceptance.       Plan Discussed with: CRNA  Anesthesia Plan Comments:        Anesthesia Quick Evaluation

## 2019-08-19 ENCOUNTER — Inpatient Hospital Stay (HOSPITAL_COMMUNITY)
Admission: RE | Admit: 2019-08-19 | Discharge: 2019-08-20 | DRG: 470 | Disposition: A | Discharge status: Home Health | Payer: Medicare Other | Source: Ambulatory Visit | Attending: Orthopedic Surgery | Admitting: Orthopedic Surgery

## 2019-08-19 ENCOUNTER — Inpatient Hospital Stay (HOSPITAL_COMMUNITY): Payer: Medicare Other

## 2019-08-19 ENCOUNTER — Inpatient Hospital Stay (HOSPITAL_COMMUNITY): Payer: Medicare Other | Admitting: Physician Assistant

## 2019-08-19 ENCOUNTER — Encounter (HOSPITAL_COMMUNITY): Payer: Self-pay | Admitting: *Deleted

## 2019-08-19 ENCOUNTER — Encounter (HOSPITAL_COMMUNITY)
Admission: RE | Disposition: A | Discharge status: Home Health | Payer: Self-pay | Source: Ambulatory Visit | Attending: Orthopedic Surgery

## 2019-08-19 ENCOUNTER — Other Ambulatory Visit: Payer: Self-pay

## 2019-08-19 DIAGNOSIS — F329 Major depressive disorder, single episode, unspecified: Secondary | ICD-10-CM | POA: Diagnosis present

## 2019-08-19 DIAGNOSIS — I10 Essential (primary) hypertension: Secondary | ICD-10-CM | POA: Diagnosis present

## 2019-08-19 DIAGNOSIS — K219 Gastro-esophageal reflux disease without esophagitis: Secondary | ICD-10-CM | POA: Diagnosis present

## 2019-08-19 DIAGNOSIS — Z7951 Long term (current) use of inhaled steroids: Secondary | ICD-10-CM | POA: Diagnosis not present

## 2019-08-19 DIAGNOSIS — G4700 Insomnia, unspecified: Secondary | ICD-10-CM | POA: Diagnosis present

## 2019-08-19 DIAGNOSIS — Z880 Allergy status to penicillin: Secondary | ICD-10-CM | POA: Diagnosis not present

## 2019-08-19 DIAGNOSIS — Z888 Allergy status to other drugs, medicaments and biological substances status: Secondary | ICD-10-CM | POA: Diagnosis not present

## 2019-08-19 DIAGNOSIS — Z6826 Body mass index (BMI) 26.0-26.9, adult: Secondary | ICD-10-CM

## 2019-08-19 DIAGNOSIS — E669 Obesity, unspecified: Secondary | ICD-10-CM | POA: Diagnosis present

## 2019-08-19 DIAGNOSIS — M1612 Unilateral primary osteoarthritis, left hip: Principal | ICD-10-CM | POA: Diagnosis present

## 2019-08-19 DIAGNOSIS — I872 Venous insufficiency (chronic) (peripheral): Secondary | ICD-10-CM | POA: Diagnosis present

## 2019-08-19 DIAGNOSIS — Z823 Family history of stroke: Secondary | ICD-10-CM

## 2019-08-19 DIAGNOSIS — Z20828 Contact with and (suspected) exposure to other viral communicable diseases: Secondary | ICD-10-CM | POA: Diagnosis present

## 2019-08-19 DIAGNOSIS — M169 Osteoarthritis of hip, unspecified: Secondary | ICD-10-CM | POA: Diagnosis present

## 2019-08-19 DIAGNOSIS — M25752 Osteophyte, left hip: Secondary | ICD-10-CM | POA: Diagnosis present

## 2019-08-19 DIAGNOSIS — Z79899 Other long term (current) drug therapy: Secondary | ICD-10-CM | POA: Diagnosis not present

## 2019-08-19 DIAGNOSIS — Z419 Encounter for procedure for purposes other than remedying health state, unspecified: Secondary | ICD-10-CM

## 2019-08-19 DIAGNOSIS — Z881 Allergy status to other antibiotic agents status: Secondary | ICD-10-CM

## 2019-08-19 DIAGNOSIS — Z96649 Presence of unspecified artificial hip joint: Secondary | ICD-10-CM

## 2019-08-19 DIAGNOSIS — E785 Hyperlipidemia, unspecified: Secondary | ICD-10-CM | POA: Diagnosis present

## 2019-08-19 DIAGNOSIS — Z8249 Family history of ischemic heart disease and other diseases of the circulatory system: Secondary | ICD-10-CM | POA: Diagnosis not present

## 2019-08-19 HISTORY — PX: TOTAL HIP ARTHROPLASTY: SHX124

## 2019-08-19 LAB — TYPE AND SCREEN
ABO/RH(D): A POS
Antibody Screen: NEGATIVE

## 2019-08-19 SURGERY — ARTHROPLASTY, HIP, TOTAL, ANTERIOR APPROACH
Anesthesia: Spinal | Site: Hip | Laterality: Left

## 2019-08-19 MED ORDER — FENTANYL CITRATE (PF) 100 MCG/2ML IJ SOLN
INTRAMUSCULAR | Status: AC
  Filled 2019-08-19: qty 2

## 2019-08-19 MED ORDER — FENTANYL CITRATE (PF) 100 MCG/2ML IJ SOLN
INTRAMUSCULAR | Status: DC | PRN
  Administered 2019-08-19 (×2): 50 ug via INTRAVENOUS

## 2019-08-19 MED ORDER — STERILE WATER FOR IRRIGATION IR SOLN
Status: DC | PRN
  Administered 2019-08-19: 2000 mL

## 2019-08-19 MED ORDER — ACETAMINOPHEN 10 MG/ML IV SOLN
1000.0000 mg | Freq: Four times a day (QID) | INTRAVENOUS | Status: DC
  Administered 2019-08-19: 09:00:00 1000 mg via INTRAVENOUS
  Filled 2019-08-19: qty 100

## 2019-08-19 MED ORDER — MORPHINE SULFATE (PF) 2 MG/ML IV SOLN: 0.5000 mg | INTRAVENOUS | Status: DC | PRN

## 2019-08-19 MED ORDER — CHLORHEXIDINE GLUCONATE 4 % EX LIQD: 60.0000 mL | Freq: Once | CUTANEOUS | Status: DC

## 2019-08-19 MED ORDER — TRIAMTERENE-HCTZ 37.5-25 MG PO TABS
1.0000 | ORAL_TABLET | Freq: Every day | ORAL | Status: DC
  Filled 2019-08-19: qty 1

## 2019-08-19 MED ORDER — ONDANSETRON HCL 4 MG/2ML IJ SOLN
INTRAMUSCULAR | Status: AC
  Filled 2019-08-19: qty 2

## 2019-08-19 MED ORDER — PROPOFOL 500 MG/50ML IV EMUL
INTRAVENOUS | Status: DC | PRN
  Administered 2019-08-19: 80 ug/kg/min via INTRAVENOUS

## 2019-08-19 MED ORDER — POLYETHYLENE GLYCOL 3350 17 G PO PACK: 17.0000 g | PACK | Freq: Every day | ORAL | Status: DC | PRN

## 2019-08-19 MED ORDER — PHENYLEPHRINE 40 MCG/ML (10ML) SYRINGE FOR IV PUSH (FOR BLOOD PRESSURE SUPPORT)
PREFILLED_SYRINGE | INTRAVENOUS | Status: AC
  Filled 2019-08-19: qty 10

## 2019-08-19 MED ORDER — PROMETHAZINE HCL 25 MG/ML IJ SOLN: 6.2500 mg | INTRAMUSCULAR | Status: DC | PRN

## 2019-08-19 MED ORDER — DEXAMETHASONE SODIUM PHOSPHATE 10 MG/ML IJ SOLN
INTRAMUSCULAR | Status: AC
  Filled 2019-08-19: qty 1

## 2019-08-19 MED ORDER — CEFAZOLIN SODIUM-DEXTROSE 2-4 GM/100ML-% IV SOLN
2.0000 g | Freq: Four times a day (QID) | INTRAVENOUS | Status: AC
  Administered 2019-08-19 (×2): 2 g via INTRAVENOUS
  Filled 2019-08-19 (×2): qty 100

## 2019-08-19 MED ORDER — BUPIVACAINE-EPINEPHRINE (PF) 0.25% -1:200000 IJ SOLN: INTRAMUSCULAR | Status: DC | PRN

## 2019-08-19 MED ORDER — HYDROCODONE-ACETAMINOPHEN 5-325 MG PO TABS
1.0000 | ORAL_TABLET | ORAL | Status: DC | PRN
  Administered 2019-08-19 – 2019-08-20 (×2): 1 via ORAL
  Administered 2019-08-20: 2 via ORAL
  Filled 2019-08-19 (×2): qty 2
  Filled 2019-08-19: qty 1

## 2019-08-19 MED ORDER — PROPOFOL 10 MG/ML IV BOLUS
INTRAVENOUS | Status: DC | PRN
  Administered 2019-08-19: 40 mg via INTRAVENOUS

## 2019-08-19 MED ORDER — TRANEXAMIC ACID-NACL 1000-0.7 MG/100ML-% IV SOLN
1000.0000 mg | INTRAVENOUS | Status: AC
  Administered 2019-08-19: 08:00:00 1000 mg via INTRAVENOUS
  Filled 2019-08-19: qty 100

## 2019-08-19 MED ORDER — LACTATED RINGERS IV SOLN
INTRAVENOUS | Status: DC
  Administered 2019-08-19 (×2): via INTRAVENOUS

## 2019-08-19 MED ORDER — PHENYLEPHRINE 40 MCG/ML (10ML) SYRINGE FOR IV PUSH (FOR BLOOD PRESSURE SUPPORT)
PREFILLED_SYRINGE | INTRAVENOUS | Status: DC | PRN
  Administered 2019-08-19 (×2): 80 ug via INTRAVENOUS

## 2019-08-19 MED ORDER — BUPIVACAINE-EPINEPHRINE 0.5% -1:200000 IJ SOLN
INTRAMUSCULAR | Status: AC
  Filled 2019-08-19: qty 1

## 2019-08-19 MED ORDER — DOCUSATE SODIUM 100 MG PO CAPS
100.0000 mg | ORAL_CAPSULE | Freq: Two times a day (BID) | ORAL | Status: DC
  Administered 2019-08-19 – 2019-08-20 (×2): 100 mg via ORAL
  Filled 2019-08-19 (×2): qty 1

## 2019-08-19 MED ORDER — HYDROMORPHONE HCL 1 MG/ML IJ SOLN: 0.2500 mg | INTRAMUSCULAR | Status: DC | PRN

## 2019-08-19 MED ORDER — PRAVASTATIN SODIUM 20 MG PO TABS
80.0000 mg | ORAL_TABLET | Freq: Every evening | ORAL | Status: DC
  Filled 2019-08-19: qty 4

## 2019-08-19 MED ORDER — PROPOFOL 10 MG/ML IV BOLUS
INTRAVENOUS | Status: AC
  Filled 2019-08-19: qty 60

## 2019-08-19 MED ORDER — ONDANSETRON HCL 4 MG/2ML IJ SOLN
INTRAMUSCULAR | Status: DC | PRN
  Administered 2019-08-19: 4 mg via INTRAVENOUS

## 2019-08-19 MED ORDER — DEXAMETHASONE SODIUM PHOSPHATE 10 MG/ML IJ SOLN
10.0000 mg | Freq: Once | INTRAMUSCULAR | Status: AC
  Administered 2019-08-20: 10 mg via INTRAVENOUS
  Filled 2019-08-19: qty 1

## 2019-08-19 MED ORDER — SODIUM CHLORIDE 0.9 % IV SOLN
INTRAVENOUS | Status: DC
  Administered 2019-08-19 – 2019-08-20 (×2): via INTRAVENOUS

## 2019-08-19 MED ORDER — EZETIMIBE 10 MG PO TABS
10.0000 mg | ORAL_TABLET | Freq: Every day | ORAL | Status: DC
  Filled 2019-08-19: qty 1

## 2019-08-19 MED ORDER — MEPERIDINE HCL 50 MG/ML IJ SOLN: 6.2500 mg | INTRAMUSCULAR | Status: DC | PRN

## 2019-08-19 MED ORDER — BUPIVACAINE HCL (PF) 0.25 % IJ SOLN
INTRAMUSCULAR | Status: DC | PRN
  Administered 2019-08-19: 30 mL

## 2019-08-19 MED ORDER — ONDANSETRON HCL 4 MG PO TABS: 4.0000 mg | ORAL_TABLET | Freq: Four times a day (QID) | ORAL | Status: DC | PRN

## 2019-08-19 MED ORDER — ACETAMINOPHEN 500 MG PO TABS
500.0000 mg | ORAL_TABLET | Freq: Four times a day (QID) | ORAL | Status: AC
  Administered 2019-08-19 – 2019-08-20 (×3): 500 mg via ORAL
  Filled 2019-08-19 (×3): qty 1

## 2019-08-19 MED ORDER — METOCLOPRAMIDE HCL 5 MG PO TABS: 5.0000 mg | ORAL_TABLET | Freq: Three times a day (TID) | ORAL | Status: DC | PRN

## 2019-08-19 MED ORDER — METHOCARBAMOL 500 MG PO TABS
500.0000 mg | ORAL_TABLET | Freq: Four times a day (QID) | ORAL | Status: DC | PRN
  Administered 2019-08-19 – 2019-08-20 (×4): 500 mg via ORAL
  Filled 2019-08-19 (×4): qty 1

## 2019-08-19 MED ORDER — DIPHENHYDRAMINE HCL 12.5 MG/5ML PO ELIX
12.5000 mg | ORAL_SOLUTION | ORAL | Status: DC | PRN
  Administered 2019-08-19: 25 mg via ORAL
  Filled 2019-08-19: qty 10

## 2019-08-19 MED ORDER — BUPIVACAINE HCL (PF) 0.25 % IJ SOLN
INTRAMUSCULAR | Status: AC
  Filled 2019-08-19: qty 30

## 2019-08-19 MED ORDER — PHENOL 1.4 % MT LIQD
1.0000 | OROMUCOSAL | Status: DC | PRN
  Filled 2019-08-19: qty 177

## 2019-08-19 MED ORDER — METOCLOPRAMIDE HCL 5 MG/ML IJ SOLN: 5.0000 mg | Freq: Three times a day (TID) | INTRAMUSCULAR | Status: DC | PRN

## 2019-08-19 MED ORDER — BISACODYL 10 MG RE SUPP: 10.0000 mg | Freq: Every day | RECTAL | Status: DC | PRN

## 2019-08-19 MED ORDER — FLUTICASONE PROPIONATE 50 MCG/ACT NA SUSP
1.0000 | Freq: Every day | NASAL | Status: DC
  Filled 2019-08-19: qty 16

## 2019-08-19 MED ORDER — TRIMETHOPRIM 100 MG PO TABS
100.0000 mg | ORAL_TABLET | Freq: Every day | ORAL | Status: DC
  Filled 2019-08-19: qty 1

## 2019-08-19 MED ORDER — ASPIRIN EC 325 MG PO TBEC
325.0000 mg | DELAYED_RELEASE_TABLET | Freq: Two times a day (BID) | ORAL | Status: DC
  Administered 2019-08-20: 325 mg via ORAL
  Filled 2019-08-19: qty 1

## 2019-08-19 MED ORDER — CEFAZOLIN SODIUM-DEXTROSE 2-4 GM/100ML-% IV SOLN
2.0000 g | INTRAVENOUS | Status: AC
  Administered 2019-08-19: 2 g via INTRAVENOUS
  Filled 2019-08-19: qty 100

## 2019-08-19 MED ORDER — FLEET ENEMA 7-19 GM/118ML RE ENEM: 1.0000 | ENEMA | Freq: Once | RECTAL | Status: DC | PRN

## 2019-08-19 MED ORDER — METHOCARBAMOL 500 MG IVPB - SIMPLE MED
500.0000 mg | Freq: Four times a day (QID) | INTRAVENOUS | Status: DC | PRN
  Filled 2019-08-19: qty 50

## 2019-08-19 MED ORDER — KETOROLAC TROMETHAMINE 15 MG/ML IJ SOLN: 15.0000 mg | Freq: Once | INTRAMUSCULAR | Status: DC

## 2019-08-19 MED ORDER — POVIDONE-IODINE 10 % EX SWAB
2.0000 "application " | Freq: Once | CUTANEOUS | Status: AC
  Administered 2019-08-19: 2 via TOPICAL

## 2019-08-19 MED ORDER — DEXAMETHASONE SODIUM PHOSPHATE 10 MG/ML IJ SOLN
8.0000 mg | Freq: Once | INTRAMUSCULAR | Status: AC
  Administered 2019-08-19: 09:00:00 10 mg via INTRAVENOUS

## 2019-08-19 MED ORDER — BUPIVACAINE IN DEXTROSE 0.75-8.25 % IT SOLN
INTRATHECAL | Status: DC | PRN
  Administered 2019-08-19: 1.6 mL via INTRATHECAL

## 2019-08-19 MED ORDER — MENTHOL 3 MG MT LOZG: 1.0000 | LOZENGE | OROMUCOSAL | Status: DC | PRN

## 2019-08-19 MED ORDER — TRAMADOL HCL 50 MG PO TABS
50.0000 mg | ORAL_TABLET | Freq: Four times a day (QID) | ORAL | Status: DC | PRN
  Administered 2019-08-19 – 2019-08-20 (×3): 100 mg via ORAL
  Filled 2019-08-19 (×4): qty 2

## 2019-08-19 MED ORDER — 0.9 % SODIUM CHLORIDE (POUR BTL) OPTIME
TOPICAL | Status: DC | PRN
  Administered 2019-08-19: 1000 mL

## 2019-08-19 MED ORDER — ONDANSETRON HCL 4 MG/2ML IJ SOLN: 4.0000 mg | Freq: Four times a day (QID) | INTRAMUSCULAR | Status: DC | PRN

## 2019-08-19 SURGICAL SUPPLY — 50 items
BAG DECANTER FOR FLEXI CONT (MISCELLANEOUS) IMPLANT
BAG SPEC THK2 15X12 ZIP CLS (MISCELLANEOUS)
BAG ZIPLOCK 12X15 (MISCELLANEOUS) IMPLANT
BALL HIP CERAMIC (Hips) IMPLANT
BLADE SAG 18X100X1.27 (BLADE) ×3 IMPLANT
CLOSURE STERI-STRIP 1/2X4 (GAUZE/BANDAGES/DRESSINGS) ×2
CLOSURE WOUND 1/2 X4 (GAUZE/BANDAGES/DRESSINGS) ×1
CLSR STERI-STRIP ANTIMIC 1/2X4 (GAUZE/BANDAGES/DRESSINGS) ×2 IMPLANT
COVER PERINEAL POST (MISCELLANEOUS) ×3 IMPLANT
COVER SURGICAL LIGHT HANDLE (MISCELLANEOUS) ×3 IMPLANT
COVER WAND RF STERILE (DRAPES) IMPLANT
CUP ACET PINNACLE SECTR 50MM (Hips) IMPLANT
DECANTER SPIKE VIAL GLASS SM (MISCELLANEOUS) ×3 IMPLANT
DRAPE STERI IOBAN 125X83 (DRAPES) ×3 IMPLANT
DRAPE U-SHAPE 47X51 STRL (DRAPES) ×6 IMPLANT
DRSG ADAPTIC 3X8 NADH LF (GAUZE/BANDAGES/DRESSINGS) ×3 IMPLANT
DRSG MEPILEX BORDER 4X4 (GAUZE/BANDAGES/DRESSINGS) ×3 IMPLANT
DRSG MEPILEX BORDER 4X8 (GAUZE/BANDAGES/DRESSINGS) ×3 IMPLANT
DURAPREP 26ML APPLICATOR (WOUND CARE) ×3 IMPLANT
ELECT REM PT RETURN 15FT ADLT (MISCELLANEOUS) ×3 IMPLANT
EVACUATOR 1/8 PVC DRAIN (DRAIN) ×3 IMPLANT
GLOVE BIO SURGEON STRL SZ 6 (GLOVE) IMPLANT
GLOVE BIO SURGEON STRL SZ7 (GLOVE) IMPLANT
GLOVE BIO SURGEON STRL SZ8 (GLOVE) ×3 IMPLANT
GLOVE BIOGEL PI IND STRL 6.5 (GLOVE) IMPLANT
GLOVE BIOGEL PI IND STRL 7.0 (GLOVE) IMPLANT
GLOVE BIOGEL PI IND STRL 8 (GLOVE) ×1 IMPLANT
GLOVE BIOGEL PI INDICATOR 6.5 (GLOVE)
GLOVE BIOGEL PI INDICATOR 7.0 (GLOVE)
GLOVE BIOGEL PI INDICATOR 8 (GLOVE) ×2
GOWN STRL REUS W/TWL LRG LVL3 (GOWN DISPOSABLE) ×3 IMPLANT
GOWN STRL REUS W/TWL XL LVL3 (GOWN DISPOSABLE) IMPLANT
HIP BALL CERAMIC (Hips) ×3 IMPLANT
HOLDER FOLEY CATH W/STRAP (MISCELLANEOUS) ×3 IMPLANT
KIT TURNOVER KIT A (KITS) IMPLANT
LINER MARATHON 32 50 (Hips) ×2 IMPLANT
MANIFOLD NEPTUNE II (INSTRUMENTS) ×3 IMPLANT
PACK ANTERIOR HIP CUSTOM (KITS) ×3 IMPLANT
PINNACLE SECTOR CUP 50MM (Hips) ×3 IMPLANT
STEM FEMORAL SZ 5MM STD ACTIS (Stem) ×2 IMPLANT
STRIP CLOSURE SKIN 1/2X4 (GAUZE/BANDAGES/DRESSINGS) ×2 IMPLANT
SUT ETHIBOND NAB CT1 #1 30IN (SUTURE) ×3 IMPLANT
SUT MNCRL AB 4-0 PS2 18 (SUTURE) ×3 IMPLANT
SUT STRATAFIX 0 PDS 27 VIOLET (SUTURE) ×3
SUT VIC AB 2-0 CT1 27 (SUTURE) ×6
SUT VIC AB 2-0 CT1 TAPERPNT 27 (SUTURE) ×2 IMPLANT
SUTURE STRATFX 0 PDS 27 VIOLET (SUTURE) ×1 IMPLANT
SYR 50ML LL SCALE MARK (SYRINGE) IMPLANT
TRAY FOLEY MTR SLVR 14FR STAT (SET/KITS/TRAYS/PACK) ×2 IMPLANT
YANKAUER SUCT BULB TIP 10FT TU (MISCELLANEOUS) ×3 IMPLANT

## 2019-08-19 NOTE — Anesthesia Procedure Notes (Signed)
Spinal  Patient location during procedure: OR Start time: 08/19/2019 8:18 AM Staffing Resident/CRNA: [place] Lynell Kussman C, CRNA Performed: resident/CRNA  Preanesthetic Checklist Completed: patient identified, site marked, surgical consent, pre-op evaluation, timeout performed, IV checked, risks and benefits discussed and monitors and equipment checked Spinal Block Patient position: sitting Prep: DuraPrep Patient monitoring: heart rate, cardiac monitor, continuous pulse ox and blood pressure Approach: midline Location: L3-4 Injection technique: single-shot Needle Needle type: Pencan  Needle gauge: 25 G Needle length: 9 cm Assessment Sensory level: T4 Additional Notes IV functioning, monitors applied to pt. Expiration date of kit checked and confirmed to be in date. Sterile prep and drape, hand hygiene and sterile gloved used. Pt was positioned and spine was prepped in sterile fashion. Skin was anesthetized with lidocaine. Free flow of clear CSF obtained prior to injecting local anesthetic into CSF x 1 attempt. Spinal needle aspirated freely following injection. Needle was carefully withdrawn, and pt tolerated procedure well. Loss of motor and sensory on exam post injection.

## 2019-08-19 NOTE — Evaluation (Signed)
Physical Therapy Evaluation Patient Details Name: OVETTA BAZZANO MRN: [id number] DOB: [birth year] Today's Date: 08/19/2019   History of Present Illness  [name]  Clinical Impression  Pt is s/p THA resulting in the deficits listed below (see PT Problem List). Pt ambulated 120' with RW. Initiated THA HEP. Pt is highly motivated, excellent progress expected.  Pt will benefit from skilled PT to increase their independence and safety with mobility to allow discharge to the venue listed below.      Follow Up Recommendations Follow surgeon's recommendation for DC plan and follow-up therapies    Equipment Recommendations  3in1 (PT)    Recommendations for Other Services       Precautions / Restrictions Precautions Precautions: Fall Restrictions Weight Bearing Restrictions: No Other Position/Activity Restrictions: WBAT      Mobility  Bed Mobility Overal bed mobility: Needs Assistance Bed Mobility: Supine to Sit     Supine to sit: Min assist;HOB elevated     General bed mobility comments: assist to support LLE  Transfers Overall transfer level: Needs assistance Equipment used: Rolling walker (2 wheeled) Transfers: Sit to/from Stand Sit to Stand: Min guard         General transfer comment: VCs hand placement  Ambulation/Gait Ambulation/Gait assistance: Min guard Gait Distance (Feet): 120 Feet Assistive device: Rolling walker (2 wheeled) Gait Pattern/deviations: Step-to pattern;Decreased stride length Gait velocity: decr   General Gait Details: VCs sequencing, no loss of balance  Stairs            Wheelchair Mobility    Modified Rankin (Stroke Patients Only)       Balance Overall balance assessment: Modified Independent                                           Pertinent Vitals/Pain Pain Assessment: 0-10 Pain Score: 4  Pain Location: L hip Pain Descriptors / Indicators: Sore Pain Intervention(s): Limited activity within patient's  tolerance;Monitored during session;RN gave pain meds during session;Ice applied    Home Living Family/patient expects to be discharged to:: Private residence Living Arrangements: Alone Available Help at Discharge: Friend(s);Available 24 hours/day Type of Home: House Home Access: Stairs to enter Entrance Stairs-Rails: Can reach both;Left;Right Entrance Stairs-Number of Steps: 3 Home Layout: One level Home Equipment: Walker - 2 wheels      Prior Function Level of Independence: Independent               Hand Dominance        Extremity/Trunk Assessment   Upper Extremity Assessment Upper Extremity Assessment: Overall WFL for tasks assessed    Lower Extremity Assessment Lower Extremity Assessment: LLE deficits/detail LLE Deficits / Details: knee ext 3/5, hip +2/5 LLE Sensation: WNL LLE Coordination: WNL    Cervical / Trunk Assessment Cervical / Trunk Assessment: Normal  Communication   Communication: No difficulties  Cognition Arousal/Alertness: Awake/alert Behavior During Therapy: WFL for tasks assessed/performed Overall Cognitive Status: Within Functional Limits for tasks assessed                                        General Comments      Exercises Total Joint Exercises Ankle Circles/Pumps: AROM;Both;10 reps;Supine Heel Slides: AAROM;Left;15 reps;Supine Hip ABduction/ADduction: AAROM;Left;10 reps;Supine   Assessment/Plan    PT Assessment Patient needs continued PT services  PT Problem List Decreased strength;Decreased activity tolerance;Pain;Decreased mobility       PT Treatment Interventions Gait training;DME instruction;Stair training;Therapeutic exercise;Therapeutic activities;Patient/family education    PT Goals (Current goals can be found in the Care Plan section)  Acute Rehab PT Goals 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 PT Goal Formulation: With patient Time For Goal Achievement: 08/26/19 Potential to Achieve Goals:  Good    Frequency 7X/week   Barriers to discharge        Co-evaluation               AM-PAC PT "6 Clicks" Mobility  Outcome Measure Help needed turning from your back to your side while in a flat bed without using bedrails?: A Little Help needed moving from lying on your back to sitting on the side of a flat bed without using bedrails?: A Little Help needed moving to and from a bed to a chair (including a wheelchair)?: A Little Help needed standing up from a chair using your arms (e.g., wheelchair or bedside chair)?: A Little Help needed to walk in hospital room?: A Little Help needed climbing 3-5 steps with a railing? : A Lot 6 Click Score: 17    End of Session Equipment Utilized During Treatment: Gait belt Activity Tolerance: Patient tolerated treatment well Patient left: in chair;with call bell/phone within reach;with family/visitor present Nurse Communication: Mobility status PT Visit Diagnosis: Difficulty in walking, not elsewhere classified (R26.2);Pain;Muscle weakness (generalized) (M62.81) Pain - Right/Left: Left Pain - part of body: Hip    Time: [id number] PT Time Calculation (min) (ACUTE ONLY): 30 min   Charges:   PT Evaluation $PT Eval Low Complexity: 1 Low PT Treatments $Gait Training: 8-22 mins        Ralene BatheUhlenberg, Jahmia Berrett Kistler PT 08/19/2019  Acute Rehabilitation Services Pager [phone] Office [phone]

## 2019-08-19 NOTE — Discharge Instructions (Signed)
°Dr. Frank Aluisio °Total Joint Specialist °[facility] °[address] °[place], [place] [address] °[phone] ° °ANTERIOR APPROACH TOTAL HIP REPLACEMENT POSTOPERATIVE DIRECTIONS ° ° °Hip Rehabilitation, Guidelines Following Surgery  °The results of a hip operation are greatly improved after range of motion and muscle strengthening exercises. Follow all safety measures which are given to protect your hip. If any of these exercises cause increased pain or swelling in your joint, decrease the amount until you are comfortable again. Then slowly increase the exercises. Call your caregiver if you have problems or questions.  ° °HOME CARE INSTRUCTIONS  °• Remove items at home which could result in a fall. This includes throw rugs or furniture in walking pathways.  °· ICE to the affected hip every three hours for 30 minutes at a time and then as needed for pain and swelling.  Continue to use ice on the hip for pain and swelling from surgery. You may notice swelling that will progress down to the foot and ankle.  This is normal after surgery.  Elevate the leg when you are not up walking on it.   °· Continue to use the breathing machine which will help keep your temperature down.  It is common for your temperature to cycle up and down following surgery, especially at night when you are not up moving around and exerting yourself.  The breathing machine keeps your lungs expanded and your temperature down. ° °DIET °You may resume your previous home diet once your are discharged from the hospital. ° °DRESSING / WOUND CARE / SHOWERING °You may shower 3 days after surgery, but keep the wounds dry during showering.  You may use an occlusive plastic wrap (Press'n Seal for example), NO SOAKING/SUBMERGING IN THE BATHTUB.  If the bandage gets wet, change with a clean dry gauze.  If the incision gets wet, pat the wound dry with a clean towel. °You may start showering once you are discharged home but do not submerge the  incision under water. Just pat the incision dry and apply a dry gauze dressing on daily. °Change the surgical dressing daily and reapply a dry dressing each time. ° °ACTIVITY °Walk with your walker as instructed. °Use walker as long as suggested by your caregivers. °Avoid periods of inactivity such as sitting longer than an hour when not asleep. This helps prevent blood clots.  °You may resume a sexual relationship in one month or when given the OK by your doctor.  °You may return to work once you are cleared by your doctor.  °Do not drive a car for 6 weeks or until released by you surgeon.  °Do not drive while taking narcotics. ° °WEIGHT BEARING °Weight bearing as tolerated with assist device (walker, cane, etc) as directed, use it as long as suggested by your surgeon or therapist, typically at least 4-6 weeks. ° °POSTOPERATIVE CONSTIPATION PROTOCOL °Constipation - defined medically as fewer than three stools per week and severe constipation as less than one stool per week. ° °One of the most common issues patients have following surgery is constipation.  Even if you have a regular bowel pattern at home, your normal regimen is likely to be disrupted due to multiple reasons following surgery.  Combination of anesthesia, postoperative narcotics, change in appetite and fluid intake all can affect your bowels.  In order to avoid complications following surgery, here are some recommendations in order to help you during your recovery period. ° °Colace (docusate) - Pick up an over-the-counter form   of Colace or another stool softener and take twice a day as long as you are requiring postoperative pain medications.  Take with a full glass of water daily.  If you experience loose stools or diarrhea, hold the colace until you stool forms back up.  If your symptoms do not get better within 1 week or if they get worse, check with your doctor. ° °Dulcolax (bisacodyl) - Pick up over-the-counter and take as directed by the product  packaging as needed to assist with the movement of your bowels.  Take with a full glass of water.  Use this product as needed if not relieved by Colace only.  ° °MiraLax (polyethylene glycol) - Pick up over-the-counter to have on hand.  MiraLax is a solution that will increase the amount of water in your bowels to assist with bowel movements.  Take as directed and can mix with a glass of water, juice, soda, coffee, or tea.  Take if you go more than two days without a movement. °Do not use MiraLax more than once per day. Call your doctor if you are still constipated or irregular after using this medication for 7 days in a row. ° °If you continue to have problems with postoperative constipation, please contact the office for further assistance and recommendations.  If you experience "the worst abdominal pain ever" or develop nausea or vomiting, please contact the office immediatly for further recommendations for treatment. ° °ITCHING ° If you experience itching with your medications, try taking only a single pain pill, or even half a pain pill at a time.  You can also use Benadryl over the counter for itching or also to help with sleep.  ° °TED HOSE STOCKINGS °Wear the elastic stockings on both legs for three weeks following surgery during the day but you may remove then at night for sleeping. ° °MEDICATIONS °See your medication summary on the “After Visit Summary” that the nursing staff will review with you prior to discharge.  You may have some home medications which will be placed on hold until you complete the course of blood thinner medication.  It is important for you to complete the blood thinner medication as prescribed by your surgeon.  Continue your approved medications as instructed at time of discharge. ° °PRECAUTIONS °If you experience chest pain or shortness of breath - call 911 immediately for transfer to the hospital emergency department.  °If you develop a fever greater that 101 F, purulent drainage  from wound, increased redness or drainage from wound, foul odor from the wound/dressing, or calf pain - CONTACT YOUR SURGEON.   °                                                °FOLLOW-UP APPOINTMENTS °Make sure you keep all of your appointments after your operation with your surgeon and caregivers. You should call the office at the above phone number and make an appointment for approximately two weeks after the date of your surgery or on the date instructed by your surgeon outlined in the "After Visit Summary". ° °RANGE OF MOTION AND STRENGTHENING EXERCISES  °These exercises are designed to help you keep full movement of your hip joint. Follow your caregiver's or physical therapist's instructions. Perform all exercises about fifteen times, three times per day or as directed. Exercise both hips, even if you have   had only one joint replacement. These exercises can be done on a training (exercise) mat, on the floor, on a table or on a bed. Use whatever works the best and is most comfortable for you. Use music or television while you are exercising so that the exercises are a pleasant break in your day. This will make your life better with the exercises acting as a break in routine you can look forward to.  °• Lying on your back, slowly slide your foot toward your buttocks, raising your knee up off the floor. Then slowly slide your foot back down until your leg is straight again.  °• Lying on your back spread your legs as far apart as you can without causing discomfort.  °• Lying on your side, raise your upper leg and foot straight up from the floor as far as is comfortable. Slowly lower the leg and repeat.  °• Lying on your back, tighten up the muscle in the front of your thigh (quadriceps muscles). You can do this by keeping your leg straight and trying to raise your heel off the floor. This helps strengthen the largest muscle supporting your knee.  °• Lying on your back, tighten up the muscles of your buttocks both  with the legs straight and with the knee bent at a comfortable angle while keeping your heel on the floor.  ° °IF YOU ARE TRANSFERRED TO A SKILLED REHAB FACILITY °If the patient is transferred to a skilled rehab facility following release from the hospital, a list of the current medications will be sent to the facility for the patient to continue.  When discharged from the skilled rehab facility, please have the facility set up the patient's Home Health Physical Therapy prior to being released. Also, the skilled facility will be responsible for providing the patient with their medications at time of release from the facility to include their pain medication, the muscle relaxants, and their blood thinner medication. If the patient is still at the rehab facility at time of the two week follow up appointment, the skilled rehab facility will also need to assist the patient in arranging follow up appointment in our office and any transportation needs. ° °MAKE SURE YOU:  °• Understand these instructions.  °• Get help right away if you are not doing well or get worse.  ° ° °Pick up stool softner and laxative for home use following surgery while on pain medications. °Do not submerge incision under water. °Please use good hand washing techniques while changing dressing each day. °May shower starting three days after surgery. °Please use a clean towel to pat the incision dry following showers. °Continue to use ice for pain and swelling after surgery. °Do not use any lotions or creams on the incision until instructed by your surgeon. ° °

## 2019-08-19 NOTE — Op Note (Signed)
OPERATIVE REPORT- TOTAL HIP ARTHROPLASTY   PREOPERATIVE DIAGNOSIS: Osteoarthritis of the Left hip.   POSTOPERATIVE DIAGNOSIS: Osteoarthritis of the Left  hip.   PROCEDURE: Left total hip arthroplasty, anterior approach.   SURGEON: Gaynelle Arabian, MD   ASSISTANT: Griffith Citron, PA-C  ANESTHESIA:  Spinal  ESTIMATED BLOOD LOSS:-250 mL    DRAINS: Hemovac x1.   COMPLICATIONS: None   CONDITION: PACU - hemodynamically stable.   BRIEF CLINICAL NOTE: Marie Ford is a 73 y.o. female who has advanced end-  stage arthritis of their Left  hip with progressively worsening pain and  dysfunction.The patient has failed nonoperative management and presents for  total hip arthroplasty.   PROCEDURE IN DETAIL: After successful administration of spinal  anesthetic, the traction boots for the [facility] bed were placed on both  feet and the patient was placed onto the [facility] bed, boots placed into the leg  holders. The Left hip was then isolated from the perineum with plastic  drapes and prepped and draped in the usual sterile fashion. ASIS and  greater trochanter were marked and a oblique incision was made, starting  at about 1 cm lateral and 2 cm distal to the ASIS and coursing towards  the anterior cortex of the femur. The skin was cut with a 10 blade  through subcutaneous tissue to the level of the fascia overlying the  tensor fascia lata muscle. The fascia was then incised in line with the  incision at the junction of the anterior third and posterior 2/3rd. The  muscle was teased off the fascia and then the interval between the TFL  and the rectus was developed. The Hohmann retractor was then placed at  the top of the femoral neck over the capsule. The vessels overlying the  capsule were cauterized and the fat on top of the capsule was removed.  A Hohmann retractor was then placed anterior underneath the rectus  femoris to give exposure to the entire anterior capsule. A T-shaped   capsulotomy was performed. The edges were tagged and the femoral head  was identified.       Osteophytes are removed off the superior acetabulum.  The femoral neck was then cut in situ with an oscillating saw. Traction  was then applied to the left lower extremity utilizing the [facility]  traction. The femoral head was then removed. Retractors were placed  around the acetabulum and then circumferential removal of the labrum was  performed. Osteophytes were also removed. Reaming starts at 47 mm to  medialize and  Increased in 2 mm increments to 49 mm. We reamed in  approximately 40 degrees of abduction, 20 degrees anteversion. A 50 mm  pinnacle acetabular shell was then impacted in anatomic position under  fluoroscopic guidance with excellent purchase. We did not need to place  any additional dome screws. A 32 mm neutral + 4 marathon liner was then  placed into the acetabular shell.       The femoral lift was then placed along the lateral aspect of the femur  just distal to the vastus ridge. The leg was  externally rotated and capsule  was stripped off the inferior aspect of the femoral neck down to the  level of the lesser trochanter, this was done with electrocautery. The femur was lifted after this was performed. The  leg was then placed in an extended and adducted position essentially delivering the femur. We also removed the capsule superiorly and the piriformis from the piriformis  fossa to gain excellent exposure of the  proximal femur. Rongeur was used to remove some cancellous bone to get  into the lateral portion of the proximal femur for placement of the  initial starter reamer. The starter broaches was placed  the starter broach  and was shown to go down the center of the canal. Broaching  with the Actis system was then performed starting at size 0  coursing  Up to size 5. A size 5 had excellent torsional and rotational  and axial stability. The trial standard offset neck was then  placed  with a 32 + 5 trial head. The hip was then reduced. We confirmed that  the stem was in the canal both on AP and lateral x-rays. It also has excellent sizing. The hip was reduced with outstanding stability through full extension and full external rotation.. AP pelvis was taken and the leg lengths were measured and found to be equal. Hip was then dislocated again and the femoral head and neck removed. The  femoral broach was removed. Size 5 Actis stem with a standard offset  neck was then impacted into the femur following native anteversion. Has  excellent purchase in the canal. Excellent torsional and rotational and  axial stability. It is confirmed to be in the canal on AP and lateral  fluoroscopic views. The 32 + 5 ceramic head was placed and the hip  reduced with outstanding stability. Again AP pelvis was taken and it  confirmed that the leg lengths were equal. The wound was then copiously  irrigated with saline solution and the capsule reattached and repaired  with Ethibond suture. 30 ml of .25% Bupivicaine was  injected into the capsule and into the edge of the tensor fascia lata as well as subcutaneous tissue. The fascia overlying the tensor fascia lata was then closed with a running #1 V-Loc. Subcu was closed with interrupted 2-0 Vicryl and subcuticular running 4-0 Monocryl. Incision was cleaned  and dried. Steri-Strips and a bulky sterile dressing applied. Hemovac  drain was hooked to suction and then the patient was awakened and transported to  recovery in stable condition.        Please note that a surgical assistant was a medical necessity for this procedure to perform it in a safe and expeditious manner. Assistant was necessary to provide appropriate retraction of vital neurovascular structures and to prevent femoral fracture and allow for anatomic placement of the prosthesis.  Gaynelle Arabian, M.D.

## 2019-08-19 NOTE — Transfer of Care (Signed)
Immediate Anesthesia Transfer of Care Note  Patient: Marie Ford  Procedure(s) Performed: TOTAL HIP ARTHROPLASTY ANTERIOR APPROACH (Left Hip)  Patient Location: PACU  Anesthesia Type:Spinal  Level of Consciousness: awake, alert  and oriented  Airway & Oxygen Therapy: Patient Spontanous Breathing and Patient connected to face mask oxygen  Post-op Assessment: Report given to RN and Post -op Vital signs reviewed and stable  Post vital signs: Reviewed and stable  Last Vitals:  Vitals Value Taken Time  BP 120/69 08/19/19 0945  Temp    Pulse 70 08/19/19 0946  Resp 15 08/19/19 0946  SpO2 99 % 08/19/19 0946  Vitals shown include unvalidated device data.  Last Pain:  Vitals:   08/19/19 0702  TempSrc: Oral         Complications: No apparent anesthesia complications

## 2019-08-19 NOTE — Interval H&P Note (Signed)
History and Physical Interval Note:  08/19/2019 7:48 AM  Marie Ford  has presented today for surgery, with the diagnosis of left hip osteoarthritis.  The various methods of treatment have been discussed with the patient and family. After consideration of risks, benefits and other options for treatment, the patient has consented to  Procedure(s) with comments: Gunter (Left) - 135min as a surgical intervention.  The patient's history has been reviewed, patient examined, no change in status, stable for surgery.  I have reviewed the patient's chart and labs.  Questions were answered to the patient's satisfaction.     Pilar Plate [place]

## 2019-08-20 ENCOUNTER — Encounter (HOSPITAL_COMMUNITY): Payer: Self-pay | Admitting: Orthopedic Surgery

## 2019-08-20 LAB — BASIC METABOLIC PANEL
Anion gap: 10 (ref 5–15)
BUN: 17 mg/dL (ref 8–23)
CO2: 23 mmol/L (ref 22–32)
Calcium: 7.8 mg/dL — ABNORMAL LOW (ref 8.9–10.3)
Chloride: 103 mmol/L (ref 98–111)
Creatinine, Ser: 1.09 mg/dL — ABNORMAL HIGH (ref 0.44–1.00)
GFR calc Af Amer: 58 mL/min — ABNORMAL LOW (ref >60)
GFR calc non Af Amer: 50 mL/min — ABNORMAL LOW (ref >60)
Glucose, Bld: 155 mg/dL — ABNORMAL HIGH (ref 70–99)
Potassium: 3.5 mmol/L (ref 3.5–5.1)
Sodium: 136 mmol/L (ref 135–145)

## 2019-08-20 LAB — CBC
HCT: 36 % (ref 36.0–46.0)
Hemoglobin: 12 g/dL (ref 12.0–15.0)
MCH: 31.4 pg (ref 26.0–34.0)
MCHC: 33.3 g/dL (ref 30.0–36.0)
MCV: 94.2 fL (ref 80.0–100.0)
Platelets: 335 10*3/uL (ref 150–400)
RBC: 3.82 MIL/uL — ABNORMAL LOW (ref 3.87–5.11)
RDW: 11.9 % (ref 11.5–15.5)
WBC: 18.2 10*3/uL — ABNORMAL HIGH (ref 4.0–10.5)
nRBC: 0 % (ref 0.0–0.2)

## 2019-08-20 MED ORDER — HYDROCODONE-ACETAMINOPHEN 5-325 MG PO TABS: 1.0000 | ORAL_TABLET | Freq: Four times a day (QID) | ORAL | 0 refills | Status: DC | PRN

## 2019-08-20 MED ORDER — METHOCARBAMOL 500 MG PO TABS: 500.0000 mg | ORAL_TABLET | Freq: Four times a day (QID) | ORAL | 0 refills | Status: DC | PRN

## 2019-08-20 MED ORDER — TRAMADOL HCL 50 MG PO TABS: 50.0000 mg | ORAL_TABLET | Freq: Four times a day (QID) | ORAL | 0 refills | Status: DC | PRN

## 2019-08-20 MED ORDER — ASPIRIN 325 MG PO TBEC: 325.0000 mg | DELAYED_RELEASE_TABLET | Freq: Two times a day (BID) | ORAL | 0 refills | Status: DC

## 2019-08-20 NOTE — Progress Notes (Signed)
   Subjective: 1 Day Post-Op Procedure(s) (LRB): TOTAL HIP ARTHROPLASTY ANTERIOR APPROACH (Left) Patient reports pain as mild.   Patient seen in rounds with Dr. Wynelle Link. Patient is well, and has had no acute complaints or problems other than mild left hip pain. No SOB or chest pain. Catheter just removed. Positive flatus.  Plan is to go Home after hospital stay.  Objective: Vital signs in last 24 hours: Temp:  [97.4 F (36.3 C)-98.4 F (36.9 C)] 97.9 F (36.6 C) (09/10 0608) Pulse Rate:  [63-81] 73 (09/10 0608) Resp:  [10-16] 14 (09/10 0608) BP: (118-134)/(69-85) 118/71 (09/10 0608) SpO2:  [95 %-100 %] 96 % (09/10 3419) Weight:  [73.2 kg] 73.2 kg (09/09 1112)  Intake/Output from previous day:  Intake/Output Summary (Last 24 hours) at 08/20/2019 0755 Last data filed at 08/20/2019 0625 Gross per 24 hour  Intake 4329.8 ml  Output 3235 ml  Net 1094.8 ml     Labs: Recent Labs    08/20/19 0252  HGB 12.0   Recent Labs    08/20/19 0252  WBC 18.2*  RBC 3.82*  HCT 36.0  PLT 335   Recent Labs    08/20/19 0252  NA 136  K 3.5  CL 103  CO2 23  BUN 17  CREATININE 1.09*  GLUCOSE 155*  CALCIUM 7.8*    EXAM General - Patient is Alert and Oriented Extremity - Neurologically intact Intact pulses distally Dorsiflexion/Plantar flexion intact No cellulitis present Compartment soft Dressing - dressing C/D/I Motor Function - intact, moving foot and toes well on exam.  Hemovac pulled without difficulty.  Past Medical History:  Diagnosis Date  . Degenerative arthritis of hip    requirde surgery 2009  . Depression 2010  . Diplopia 2011   in left lateral gaze   . Diplopia    " my eyes dont track together, i have corrective prisms in my eye glasses"   . Diverticulosis 2006   noted in a colonoscopy  . Esophageal reflux 2013  . IGT (impaired glucose tolerance) 2011   denies hx of diabetes   . Insomnia 2009  . Osteoarthritis   . Osteopenia 2005  . Other and  unspecified hyperlipidemia 2000  . Other specified disease of sebaceous glands   . Palpitations 2009   secondary to PSVT  . PNA (pneumonia) 2014  . Psoriasis 2004  . PSVT (paroxysmal supraventricular tachycardia) ([place]) 2009   "report it has been some time since i had that "   . Situational stress   . Venous insufficiency since 2017    Assessment/Plan: 1 Day Post-Op Procedure(s) (LRB): TOTAL HIP ARTHROPLASTY ANTERIOR APPROACH (Left) Principal Problem:   OA (osteoarthritis) of hip  Estimated body mass index is 26.04 kg/m as calculated from the following:   Height as of this encounter: 5\' 6"  (1.676 m).   Weight as of this encounter: 73.2 kg. Advance diet Up with therapy D/C IV fluids when tolerating POs well  DVT Prophylaxis - Aspirin Weight Bearing As Tolerated  Hemovac Pulled   We will have her work with therapy today. May require two sessions. Plan for DC home today pending progress. Follow up in office in 2 weeks. Discharge instructions given. HHPT ordered.   Ardeen Jourdain, PA-C Orthopaedic Surgery 08/20/2019, 7:55 AM

## 2019-08-20 NOTE — Progress Notes (Signed)
Patient discharged to home w/ family. Given all belongings, instructions. Verbalized understanding of instructions. Escorted to pov via w/c. 

## 2019-08-20 NOTE — Discharge Summary (Signed)
Physician Discharge Summary   Patient ID: Marie Ford MRN: [id number] DOB/AGE: 01/08/1946 73 y.o.  Admit date: 08/19/2019 Discharge date: 08/20/2019  Primary Diagnosis: Primary osteoarthritis left hip   Admission Diagnoses:  Past Medical History:  Diagnosis Date   Degenerative arthritis of hip    requirde surgery 2009   Depression 2010   Diplopia 2011   in left lateral gaze    Diplopia    " my eyes dont track together, i have corrective prisms in my eye glasses"    Diverticulosis 2006   noted in a colonoscopy   Esophageal reflux 2013   IGT (impaired glucose tolerance) 2011   denies hx of diabetes    Insomnia 2009   Osteoarthritis    Osteopenia 2005   Other and unspecified hyperlipidemia 2000   Other specified disease of sebaceous glands    Palpitations 2009   secondary to PSVT   PNA (pneumonia) 2014   Psoriasis 2004   PSVT (paroxysmal supraventricular tachycardia) (HCC) 2009   "report it has been some time since i had that "    Situational stress    Venous insufficiency since 2017   Discharge Diagnoses:   Principal Problem:   OA (osteoarthritis) of hip  Estimated body mass index is 26.04 kg/m as calculated from the following:   Height as of this encounter: 5\' 6"  (1.676 m).   Weight as of this encounter: 73.2 kg.  Procedure(s) (LRB): TOTAL HIP ARTHROPLASTY ANTERIOR APPROACH (Left)   Consults: None  HPI: Marie Ford, 73 y.o. female, has a history of pain and functional disability in the left hip(s) due to arthritis and patient has failed non-surgical conservative treatments for greater than 12 weeks to include NSAID's and/or analgesics, corticosteriod injections, flexibility and strengthening excercises, use of assistive devices and activity modification.  Onset of symptoms was gradual starting 3 years ago with gradually worsening course since that time.The patient noted no past surgery on the left hip(s).  Patient currently rates pain in the  left hip at 7 out of 10 with activity. Patient has night pain, worsening of pain with activity and weight bearing, pain that interfers with activities of daily living and pain with passive range of motion. Patient has evidence of subchondral cysts, periarticular osteophytes and joint space narrowing by imaging studies. This condition presents safety issues increasing the risk of falls. There is no current active infection.  Laboratory Data: Admission on 08/19/2019, Discharged on 08/20/2019  Component Date Value Ref Range Status   WBC 08/20/2019 18.2* 4.0 - 10.5 K/uL Final   RBC 08/20/2019 3.82* 3.87 - 5.11 MIL/uL Final   Hemoglobin 08/20/2019 12.0  12.0 - 15.0 g/dL Final   HCT 40/98/1191 36.0  36.0 - 46.0 % Final   MCV 08/20/2019 94.2  80.0 - 100.0 fL Final   MCH 08/20/2019 31.4  26.0 - 34.0 pg Final   MCHC 08/20/2019 33.3  30.0 - 36.0 g/dL Final   RDW 47/82/9562 11.9  11.5 - 15.5 % Final   Platelets 08/20/2019 335  150 - 400 K/uL Final   nRBC 08/20/2019 0.0  0.0 - 0.2 % Final   Performed at [facility], [address], [place], [place] [address]   Sodium 08/20/2019 136  135 - 145 mmol/L Final   Potassium 08/20/2019 3.5  3.5 - 5.1 mmol/L Final   Chloride 08/20/2019 103  98 - 111 mmol/L Final   CO2 08/20/2019 23  22 - 32 mmol/L Final   Glucose, Bld 08/20/2019 155*  70 - 99 mg/dL Final   BUN 16/10/960409/09/2019 17  8 - 23 mg/dL Final   Creatinine, Ser 08/20/2019 1.09* 0.44 - 1.00 mg/dL Final   Calcium 54/09/811909/09/2019 7.8* 8.9 - 10.3 mg/dL Final   GFR calc non Af Amer 08/20/2019 50* >60 mL/min Final   GFR calc Af Amer 08/20/2019 58* >60 mL/min Final   Anion gap 08/20/2019 10  5 - 15 Final   Performed at [facility], [address] [place], [place] [address]  Hospital Outpatient Visit on 08/15/2019  Component Date Value Ref Range Status   SARS-CoV-2, NAA 08/15/2019 NOT DETECTED  NOT DETECTED Final   Comment: (NOTE) This nucleic acid  amplification test was developed and its performance characteristics determined by [facility]. Nucleic acid amplification tests include PCR and TMA. This test has not been FDA cleared or approved. This test has been authorized by FDA under an Emergency Use Authorization (EUA). This test is only authorized for the duration of time the declaration that circumstances exist justifying the authorization of the emergency use of in vitro diagnostic tests for detection of SARS-CoV-2 virus and/or diagnosis of COVID-19 infection under section 564(b)(1) of the Act, 21 U.S.C. 956OZH-0(Q360bbb-3(b) (1), unless the authorization is terminated or revoked sooner. When diagnostic testing is negative, the possibility of a false negative result should be considered in the context of a patient's recent exposures and the presence of clinical signs and symptoms consistent with COVID-19. An individual without symptoms of COVID- 19 and who is not shedding SARS-CoV-2 vi                          rus would expect to have a negative (not detected) result in this assay. Performed At: [facility] [address] [place], [place] [id number] Jolene SchimkeNagendra Sanjai MD XB:[id number]:[phone]    Coronavirus Source 08/15/2019 NASOPHARYNGEAL   Final   Performed at [facility], [address] [place], [place] [address]  Hospital Outpatient Visit on 08/12/2019  Component Date Value Ref Range Status   aPTT 08/12/2019 33  24 - 36 seconds Final   Performed at [facility], [address] [place], [place] [address]   WBC 08/12/2019 8.1  4.0 - 10.5 K/uL Final   RBC 08/12/2019 4.74  3.87 - 5.11 MIL/uL Final   Hemoglobin 08/12/2019 14.9  12.0 - 15.0 g/dL Final   HCT 03/47/425909/01/2019 44.8  36.0 - 46.0 % Final   MCV 08/12/2019 94.5  80.0 - 100.0 fL Final   MCH 08/12/2019 31.4  26.0 - 34.0 pg Final   MCHC 08/12/2019 33.3  30.0 - 36.0 g/dL Final   RDW 56/38/756409/01/2019 12.0  11.5 - 15.5 % Final   Platelets  08/12/2019 444* 150 - 400 K/uL Final   nRBC 08/12/2019 0.0  0.0 - 0.2 % Final   Performed at [facility], [address] [place], [place] [address]   Sodium 08/12/2019 141  135 - 145 mmol/L Final   Potassium 08/12/2019 4.5  3.5 - 5.1 mmol/L Final   Chloride 08/12/2019 100  98 - 111 mmol/L Final   CO2 08/12/2019 30  22 - 32 mmol/L Final   Glucose, Bld 08/12/2019 105* 70 - 99 mg/dL Final   BUN 18/84/166009/01/2019 28* 8 - 23 mg/dL Final   Creatinine, Ser 08/12/2019 1.46* 0.44 - 1.00 mg/dL Final   Calcium 63/01/601009/01/2019 10.2  8.9 - 10.3 mg/dL Final   Total Protein 93/23/557309/01/2019 7.6  6.5 -  8.1 g/dL Final   Albumin 09/29/1172 4.0  3.5 - 5.0 g/dL Final   AST 56/70/1410 21  15 - 41 U/L Final   ALT 08/12/2019 14  0 - 44 U/L Final   Alkaline Phosphatase 08/12/2019 63  38 - 126 U/L Final   Total Bilirubin 08/12/2019 1.3* 0.3 - 1.2 mg/dL Final   GFR calc non Af Amer 08/12/2019 35* >60 mL/min Final   GFR calc Af Amer 08/12/2019 41* >60 mL/min Final   Anion gap 08/12/2019 11  5 - 15 Final   Performed at [facility], [address], [place], [place] [address]   Prothrombin Time 08/12/2019 13.2  11.4 - 15.2 seconds Final   INR 08/12/2019 1.0  0.8 - 1.2 Final   Comment: (NOTE) INR goal varies based on device and disease states. Performed at [facility], [address] [place], [place] [address]    ABO/RH(D) 08/12/2019 A POS   Final   Antibody Screen 08/12/2019 NEG   Final   Sample Expiration 08/12/2019 08/22/2019,2359   Final   Extend sample reason 08/12/2019    Final                   Value:NO TRANSFUSIONS OR PREGNANCY IN THE PAST 3 MONTHS Performed at [facility], [address], [place], [place] [address]    MRSA, PCR 08/12/2019 NEGATIVE  NEGATIVE Final   Staphylococcus aureus 08/12/2019 NEGATIVE  NEGATIVE Final   Comment: (NOTE) The Xpert SA Assay (FDA approved for NASAL specimens in patients  32 years of age and older), is one component of a comprehensive surveillance program. It is not intended to diagnose infection nor to guide or monitor treatment. Performed at [facility], [address] [place], [place] [address]      X-Rays:Dg Pelvis Portable  Result Date: 08/19/2019 CLINICAL DATA:  Status post left hip replacement EXAM: PORTABLE PELVIS 1-2 VIEWS COMPARISON:  Film from earlier in the same day. FINDINGS: Bilateral hip prostheses are seen. Pelvic ring is intact. Surgical drain is noted on the left. No acute bony or soft tissue abnormality is seen. IMPRESSION: Status post left hip replacement. Electronically Signed   By: Alcide Clever M.D.   On: 08/19/2019 10:33   Dg C-arm 1-60 Min-no Report  Result Date: 08/19/2019 Fluoroscopy was utilized by the requesting physician.  No radiographic interpretation.   Dg Hip Operative Unilat W Or W/o Pelvis Left  Result Date: 08/19/2019 CLINICAL DATA:  73 year old female with hip replacement EXAM: OPERATIVE LEFT HIP (WITH PELVIS IF PERFORMED)  VIEWS TECHNIQUE: Fluoroscopic spot image(s) were submitted for interpretation post-operatively. COMPARISON:  February 25, 2011 FINDINGS: Limited intraoperative fluoroscopic spot images demonstrate surgical changes of left hip arthroplasty. Partially imaged right hip arthroplasty. IMPRESSION: Limited intraoperative spot images demonstrating early surgical changes of left hip arthroplasty. Please refer to the dictated operative report for full details of intraoperative findings and procedure. Electronically Signed   By: Gilmer Mor D.O.   On: 08/19/2019 09:58    EKG: Orders placed or performed in visit on 02/26/14   EKG     Hospital Course: Patient was admitted to [facility] and taken to the OR and underwent the above state procedure without complications.  Patient tolerated the procedure well and was later transferred to the recovery room and then to the orthopaedic floor  for postoperative care.  They were given PO and IV analgesics for pain control following their surgery.  They were  given 24 hours of postoperative antibiotics of  Anti-infectives (From admission, onward)   Start     Dose/Rate Route Frequency Ordered Stop   08/20/19 1000  trimethoprim (TRIMPEX) tablet 100 mg     100 mg Oral Daily 08/19/19 1107     08/19/19 1430  ceFAZolin (ANCEF) IVPB 2g/100 mL premix     2 g 200 mL/hr over 30 Minutes Intravenous Every 6 hours 08/19/19 1107 08/19/19 2032   08/19/19 0645  ceFAZolin (ANCEF) IVPB 2g/100 mL premix     2 g 200 mL/hr over 30 Minutes Intravenous On call to O.R. 08/19/19 1610 08/19/19 9604     and started on DVT prophylaxis in the form of Aspirin.   PT and OT were ordered for total hip protocol.  The patient was allowed to be WBAT with therapy. Discharge planning was consulted to help with postop disposition and equipment needs.  Patient had a fair night on the evening of surgery.  They started to get up OOB with therapy on day one.  Hemovac drain was pulled without difficulty.  The patient had progressed with therapy and meeting their goals.  Incision was healing well.  Patient was seen in rounds and was ready to go home with HHPT.  Diet: Cardiac diet Activity:WBAT Follow-up:in 2 weeks Disposition - Home Discharged Condition: stable   Discharge Instructions    Call MD / Call 911   Complete by: As directed    If you experience chest pain or shortness of breath, CALL 911 and be transported to the hospital emergency room.  If you develope a fever above 101 F, pus (white drainage) or increased drainage or redness at the wound, or calf pain, call your surgeon's office.   Constipation Prevention   Complete by: As directed    Drink plenty of fluids.  Prune juice may be helpful.  You may use a stool softener, such as Colace (over the counter) 100 mg twice a day.  Use MiraLax (over the counter) for constipation as needed.   Diet - low sodium heart healthy    Complete by: As directed    Discharge instructions   Complete by: As directed    Dr. Ollen Gross Total Joint Specialist [facility] [address], [address] [place], [place] [address] [phone]  ANTERIOR APPROACH TOTAL HIP REPLACEMENT POSTOPERATIVE DIRECTIONS   Hip Rehabilitation, Guidelines Following Surgery  The results of a hip operation are greatly improved after range of motion and muscle strengthening exercises. Follow all safety measures which are given to protect your hip. If any of these exercises cause increased pain or swelling in your joint, decrease the amount until you are comfortable again. Then slowly increase the exercises. Call your caregiver if you have problems or questions.   HOME CARE INSTRUCTIONS  Remove items at home which could result in a fall. This includes throw rugs or furniture in walking pathways.  ICE to the affected hip every three hours for 30 minutes at a time and then as needed for pain and swelling.  Continue to use ice on the hip for pain and swelling from surgery. You may notice swelling that will progress down to the foot and ankle.  This is normal after surgery.  Elevate the leg when you are not up walking on it.   Continue to use the breathing machine which will help keep your temperature down.  It is common for your temperature to cycle up and down following surgery, especially at night when you are  not up moving around and exerting yourself.  The breathing machine keeps your lungs expanded and your temperature down.   DIET You may resume your previous home diet once your are discharged from the hospital.  DRESSING / WOUND CARE / SHOWERING You may shower 3 days after surgery, but keep the wounds dry during showering.  You may use an occlusive plastic wrap (Press'n Seal for example), NO SOAKING/SUBMERGING IN THE BATHTUB.  If the bandage gets wet, change with a clean dry gauze.  If the incision gets wet, pat the wound dry with a clean  towel. You may start showering once you are discharged home but do not submerge the incision under water. Just pat the incision dry and apply a dry gauze dressing on daily. Change the surgical dressing daily and reapply a dry dressing each time.  ACTIVITY Walk with your walker as instructed. Use walker as long as suggested by your caregivers. Avoid periods of inactivity such as sitting longer than an hour when not asleep. This helps prevent blood clots.  You may resume a sexual relationship in one month or when given the OK by your doctor.  You may return to work once you are cleared by your doctor.  Do not drive a car for 6 weeks or until released by you surgeon.  Do not drive while taking narcotics.  WEIGHT BEARING Weight bearing as tolerated with assist device (walker, cane, etc) as directed, use it as long as suggested by your surgeon or therapist, typically at least 4-6 weeks.  POSTOPERATIVE CONSTIPATION PROTOCOL Constipation - defined medically as fewer than three stools per week and severe constipation as less than one stool per week.  One of the most common issues patients have following surgery is constipation.  Even if you have a regular bowel pattern at home, your normal regimen is likely to be disrupted due to multiple reasons following surgery.  Combination of anesthesia, postoperative narcotics, change in appetite and fluid intake all can affect your bowels.  In order to avoid complications following surgery, here are some recommendations in order to help you during your recovery period.  Colace (docusate) - Pick up an over-the-counter form of Colace or another stool softener and take twice a day as long as you are requiring postoperative pain medications.  Take with a full glass of water daily.  If you experience loose stools or diarrhea, hold the colace until you stool forms back up.  If your symptoms do not get better within 1 week or if they get worse, check with your  doctor.  Dulcolax (bisacodyl) - Pick up over-the-counter and take as directed by the product packaging as needed to assist with the movement of your bowels.  Take with a full glass of water.  Use this product as needed if not relieved by Colace only.   MiraLax (polyethylene glycol) - Pick up over-the-counter to have on hand.  MiraLax is a solution that will increase the amount of water in your bowels to assist with bowel movements.  Take as directed and can mix with a glass of water, juice, soda, coffee, or tea.  Take if you go more than two days without a movement. Do not use MiraLax more than once per day. Call your doctor if you are still constipated or irregular after using this medication for 7 days in a row.  If you continue to have problems with postoperative constipation, please contact the office for further assistance and recommendations.  If you experience "the  worst abdominal pain ever" or develop nausea or vomiting, please contact the office immediatly for further recommendations for treatment.  ITCHING  If you experience itching with your medications, try taking only a single pain pill, or even half a pain pill at a time.  You can also use Benadryl over the counter for itching or also to help with sleep.   TED HOSE STOCKINGS Wear the elastic stockings on both legs for three weeks following surgery during the day but you may remove then at night for sleeping.  MEDICATIONS See your medication summary on the "After Visit Summary" that the nursing staff will review with you prior to discharge.  You may have some home medications which will be placed on hold until you complete the course of blood thinner medication.  It is important for you to complete the blood thinner medication as prescribed by your surgeon.  Continue your approved medications as instructed at time of discharge.  PRECAUTIONS If you experience chest pain or shortness of breath - call 911 immediately for transfer to the  hospital emergency department.  If you develop a fever greater that 101 F, purulent drainage from wound, increased redness or drainage from wound, foul odor from the wound/dressing, or calf pain - CONTACT YOUR SURGEON.                                                   FOLLOW-UP APPOINTMENTS Make sure you keep all of your appointments after your operation with your surgeon and caregivers. You should call the office at the above phone number and make an appointment for approximately two weeks after the date of your surgery or on the date instructed by your surgeon outlined in the "After Visit Summary".  RANGE OF MOTION AND STRENGTHENING EXERCISES  These exercises are designed to help you keep full movement of your hip joint. Follow your caregiver's or physical therapist's instructions. Perform all exercises about fifteen times, three times per day or as directed. Exercise both hips, even if you have had only one joint replacement. These exercises can be done on a training (exercise) mat, on the floor, on a table or on a bed. Use whatever works the best and is most comfortable for you. Use music or television while you are exercising so that the exercises are a pleasant break in your day. This will make your life better with the exercises acting as a break in routine you can look forward to.  Lying on your back, slowly slide your foot toward your buttocks, raising your knee up off the floor. Then slowly slide your foot back down until your leg is straight again.  Lying on your back spread your legs as far apart as you can without causing discomfort.  Lying on your side, raise your upper leg and foot straight up from the floor as far as is comfortable. Slowly lower the leg and repeat.  Lying on your back, tighten up the muscle in the front of your thigh (quadriceps muscles). You can do this by keeping your leg straight and trying to raise your heel off the floor. This helps strengthen the largest muscle  supporting your knee.  Lying on your back, tighten up the muscles of your buttocks both with the legs straight and with the knee bent at a comfortable angle while keeping your heel on  the floor.   IF YOU ARE TRANSFERRED TO A SKILLED REHAB FACILITY If the patient is transferred to a skilled rehab facility following release from the hospital, a list of the current medications will be sent to the facility for the patient to continue.  When discharged from the skilled rehab facility, please have the facility set up the patient's [place] prior to being released. Also, the skilled facility will be responsible for providing the patient with their medications at time of release from the facility to include their pain medication, the muscle relaxants, and their blood thinner medication. If the patient is still at the rehab facility at time of the two week follow up appointment, the skilled rehab facility will also need to assist the patient in arranging follow up appointment in our office and any transportation needs.  MAKE SURE YOU:  Understand these instructions.  Get help right away if you are not doing well or get worse.    Pick up stool softner and laxative for home use following surgery while on pain medications. Do not submerge incision under water. Please use good hand washing techniques while changing dressing each day. May shower starting three days after surgery. Please use a clean towel to pat the incision dry following showers. Continue to use ice for pain and swelling after surgery. Do not use any lotions or creams on the incision until instructed by your surgeon.   Increase activity slowly as tolerated   Complete by: As directed      Allergies as of 08/20/2019      Reactions   Erythromycin Shortness Of Breath, Rash   Nitrofurantoin Other (See Comments)   Pass out   Penicillins Rash   Did it involve swelling of the face/tongue/throat, SOB, or low BP? No Did it  involve sudden or severe rash/hives, skin peeling, or any reaction on the inside of your mouth or nose? No Did you need to seek medical attention at a hospital or doctor's office? No When did it last happen? More than 10 years ago If all above answers are NO, may proceed with cephalosporin use.      Medication List    STOP taking these medications   meloxicam 15 MG tablet Commonly known as: MOBIC     TAKE these medications   aspirin 325 MG EC tablet Take 1 tablet (325 mg total) by mouth 2 (two) times daily.   Calcium 600+D3 600-400 MG-UNIT Tabs Generic drug: Calcium Carbonate-Vitamin D3 Take 1 tablet by mouth daily.   ezetimibe 10 MG tablet Commonly known as: ZETIA Take 10 mg by mouth daily.   fluticasone 50 MCG/ACT nasal spray Commonly known as: FLONASE Place 1 spray into both nostrils daily.   HYDROcodone-acetaminophen 5-325 MG tablet Commonly known as: NORCO/VICODIN Take 1-2 tablets by mouth every 6 (six) hours as needed for severe pain.   methocarbamol 500 MG tablet Commonly known as: ROBAXIN Take 1 tablet (500 mg total) by mouth every 6 (six) hours as needed for muscle spasms.   multivitamin with minerals Tabs tablet Take 1 tablet by mouth daily. Women's Multivitamin   omeprazole 20 MG capsule Commonly known as: PRILOSEC Take 20 mg by mouth daily as needed (with meloxicam use).   pravastatin 80 MG tablet Commonly known as: PRAVACHOL Take 80 mg by mouth every evening.   Systane 0.4-0.3 % Soln Generic drug: Polyethyl Glycol-Propyl Glycol Place 1 drop into both eyes 3 (three) times daily as needed (dry/irritated eyes.).   traMADol 50  MG tablet Commonly known as: ULTRAM Take 1-2 tablets (50-100 mg total) by mouth every 6 (six) hours as needed for moderate pain.   triamterene-hydrochlorothiazide 37.5-25 MG tablet Commonly known as: MAXZIDE-25 Take 1 tablet by mouth daily.   trimethoprim 100 MG tablet Commonly known as: TRIMPEX Take 100 mg by mouth  daily.      Follow-up Information    Ollen Gross, MD. Schedule an appointment as soon as possible for a visit on 09/01/2019.   Specialty: Orthopedic Surgery Contact information: [address] [place] [address] [place] [address] [phone]        [facility] Follow up.   Specialty: [facility] Contact information: [address] [place] [address] [place] [address] [phone]           Signed: Dimitri Ped, PA-C Orthopaedic Surgery 08/20/2019, 3:33 PM

## 2019-08-20 NOTE — Progress Notes (Signed)
Physical Therapy Treatment Patient Details Name: Marie Ford MRN: [id number] DOB: 1946-12-08 Today's Date: 08/20/2019    History of Present Illness [name]    PT Comments    Pt ambulated in hallway and assisted to bathroom.  Pt requesting specific cues to assist with remember for home so increased time required for mobility.  Pt performed LE exercises as well.  Pt would like another session this afternoon to review stairs.    Follow Up Recommendations  Follow surgeon's recommendation for DC plan and follow-up therapies(plan for HHPT)     Equipment Recommendations  3in1 (PT)    Recommendations for Other Services       Precautions / Restrictions Precautions Precautions: Fall Restrictions Other Position/Activity Restrictions: WBAT    Mobility  Bed Mobility Overal bed mobility: Needs Assistance Bed Mobility: Supine to Sit;Sit to Supine     Supine to sit: Min assist Sit to supine: Min assist   General bed mobility comments: assist for L LE, pt performed twice, once with sheet to self assist  Transfers Overall transfer level: Needs assistance Equipment used: Rolling walker (2 wheeled) Transfers: Sit to/from Stand Sit to Stand: Min guard         General transfer comment: verbal cues for UE and LE positioning  Ambulation/Gait Ambulation/Gait assistance: Min guard Gait Distance (Feet): 120 Feet Assistive device: Rolling walker (2 wheeled) Gait Pattern/deviations: Step-to pattern;Decreased stride length;Antalgic Gait velocity: decr   General Gait Details: verbal cues for sequence, RW positioning, step length, posture   Stairs             Wheelchair Mobility    Modified Rankin (Stroke Patients Only)       Balance                                            Cognition Arousal/Alertness: Awake/alert Behavior During Therapy: WFL for tasks assessed/performed Overall Cognitive Status: Within Functional Limits for tasks assessed                                         Exercises Total Joint Exercises Ankle Circles/Pumps: AROM;Both;10 reps;Supine Quad Sets: AROM;Both;10 reps Heel Slides: AAROM;Left;10 reps Hip ABduction/ADduction: AAROM;Left;10 reps;Supine;Standing Long Arc Quad: AROM;Left;10 reps;Seated Knee Flexion: AROM;Left;Standing;10 reps Marching in Standing: AROM;Left;10 reps Standing Hip Extension: AROM;Left;10 reps    General Comments        Pertinent Vitals/Pain Pain Assessment: 0-10 Pain Score: 5  Pain Location: L hip Pain Descriptors / Indicators: Sore;Aching Pain Intervention(s): Monitored during session;Repositioned;Premedicated before session;Ice applied    Home Living                      Prior Function            PT Goals (current goals can now be found in the care plan section) Progress towards PT goals: Progressing toward goals    Frequency    7X/week      PT Plan Current plan remains appropriate    Co-evaluation              AM-PAC PT "6 Clicks" Mobility   Outcome Measure  Help needed turning from your back to your side while in a flat bed without using bedrails?: A Little Help needed moving from lying on  your back to sitting on the side of a flat bed without using bedrails?: A Little Help needed moving to and from a bed to a chair (including a wheelchair)?: A Little Help needed standing up from a chair using your arms (e.g., wheelchair or bedside chair)?: A Little Help needed to walk in hospital room?: A Little Help needed climbing 3-5 steps with a railing? : A Little 6 Click Score: 18    End of Session Equipment Utilized During Treatment: Gait belt Activity Tolerance: Patient tolerated treatment well Patient left: with call bell/phone within reach;in bed;with bed alarm set Nurse Communication: Mobility status PT Visit Diagnosis: Difficulty in walking, not elsewhere classified (R26.2);Pain;Muscle weakness (generalized)  (M62.81) Pain - Right/Left: Left Pain - part of body: Hip     Time: 0911-0956 PT Time Calculation (min) (ACUTE ONLY): 45 min  Charges:  $Gait Training: 23-37 mins $Therapeutic Exercise: 8-22 mins                   Carmelia Bake, PT, DPT Acute Rehabilitation Services Office: [phone] Pager: [phone]  Marie Ford 08/20/2019, 2:39 PM

## 2019-08-20 NOTE — Progress Notes (Signed)
Physical Therapy Treatment Patient Details Name: Marie Ford MRN: [id number] DOB: 1946/06/19 Today's Date: 08/20/2019    History of Present Illness [name]    PT Comments    Pt assisted with ambulating in hallway and then to bathroom again.  Pt also performed steps with friend, Marcie Bal, observing Marcie Bal is helping her home).  Pt mostly supervision for mobility.  Pt provided with HEP handout and had no further questions.  Pt does reports MD and PA said she could have HHPT however no mention of agency in progress notes so RN notified.   Follow Up Recommendations  Follow surgeon's recommendation for DC plan and follow-up therapies(plan is for HHPT)     Equipment Recommendations  3in1 (PT)    Recommendations for Other Services       Precautions / Restrictions Precautions Precautions: Fall Restrictions Other Position/Activity Restrictions: WBAT    Mobility  Bed Mobility Overal bed mobility: Needs Assistance Bed Mobility: Supine to Sit;Sit to Supine     Supine to sit: Min guard Sit to supine: Min guard   General bed mobility comments: pt self assisted with sheet  Transfers Overall transfer level: Needs assistance Equipment used: Rolling walker (2 wheeled) Transfers: Sit to/from Stand Sit to Stand: Min guard;Supervision         General transfer comment: verbal cues for UE and LE positioning  Ambulation/Gait Ambulation/Gait assistance: Min guard;Supervision Gait Distance (Feet): 160 Feet Assistive device: Rolling walker (2 wheeled) Gait Pattern/deviations: Step-to pattern;Decreased stride length;Antalgic Gait velocity: decr   General Gait Details: verbal cues for sequence, RW positioning, step length, posture   Stairs Stairs: Yes Stairs assistance: Min guard Stair Management: Step to pattern;Forwards;Two rails Number of Stairs: 2 General stair comments: verbal cues for sequence and safety, pt performed once with 2 rails and then once with one rail (has 2  rails but unable to reach both), pt's friend Marcie Bal present and observed   Wheelchair Mobility    Modified Rankin (Stroke Patients Only)       Balance                                            Cognition Arousal/Alertness: Awake/alert Behavior During Therapy: WFL for tasks assessed/performed Overall Cognitive Status: Within Functional Limits for tasks assessed                                        Exercises     General Comments        Pertinent Vitals/Pain Pain Assessment: 0-10 Pain Score: 5  Pain Location: L hip Pain Descriptors / Indicators: Sore;Aching Pain Intervention(s): Limited activity within patient's tolerance;Monitored during session;Repositioned    Home Living                      Prior Function            PT Goals (current goals can now be found in the care plan section) Progress towards PT goals: Progressing toward goals    Frequency    7X/week      PT Plan Current plan remains appropriate    Co-evaluation              AM-PAC PT "6 Clicks" Mobility   Outcome Measure  Help needed turning from your back  to your side while in a flat bed without using bedrails?: A Little Help needed moving from lying on your back to sitting on the side of a flat bed without using bedrails?: A Little Help needed moving to and from a bed to a chair (including a wheelchair)?: A Little Help needed standing up from a chair using your arms (e.g., wheelchair or bedside chair)?: A Little Help needed to walk in hospital room?: A Little Help needed climbing 3-5 steps with a railing? : A Little 6 Click Score: 18    End of Session Equipment Utilized During Treatment: Gait belt Activity Tolerance: Patient tolerated treatment well Patient left: with call bell/phone within reach;in chair;with family/visitor present Nurse Communication: Mobility status PT Visit Diagnosis: Difficulty in walking, not elsewhere classified  (R26.2);Muscle weakness (generalized) (M62.81) Pain - Right/Left: Left Pain - part of body: Hip     Time: [id number] PT Time Calculation (min) (ACUTE ONLY): 26 min  Charges:  $Gait Training: 23-37 mins                      Zenovia JarredKati Joycie Aerts, PT, DPT Acute Rehabilitation Services Office: [phone] Pager: [phone]  Sarajane JewsLEMYRE,KATHrine E 08/20/2019, 2:48 PM

## 2019-08-21 NOTE — Anesthesia Postprocedure Evaluation (Signed)
Anesthesia Post Note  Patient: Marie Ford  Procedure(s) Performed: TOTAL HIP ARTHROPLASTY ANTERIOR APPROACH (Left Hip)     Anesthesia Type: Spinal Level of consciousness: awake Pain management: pain level controlled Vital Signs Assessment: post-procedure vital signs reviewed and stable Respiratory status: spontaneous breathing Cardiovascular status: stable Postop Assessment: no headache, no backache, spinal receding and no apparent nausea or vomiting Anesthetic complications: no    Last Vitals:  Vitals:   08/20/19 0608 08/20/19 1012  BP: 118/71 117/73  Pulse: 73 70  Resp: 14 16  Temp: 36.6 C 36.7 C  SpO2: 96% 94%    Last Pain:  Vitals:   08/20/19 1416  TempSrc:   PainSc: 2    Pain Goal: Patients Stated Pain Goal: 2 (08/20/19 0800)                 Huston Foley

## 2019-11-19 ENCOUNTER — Other Ambulatory Visit: Payer: Self-pay

## 2019-11-19 ENCOUNTER — Ambulatory Visit
Admission: RE | Admit: 2019-11-19 | Discharge: 2019-11-19 | Disposition: A | Discharge status: Home / Self Care | Payer: Medicare Other | Source: Ambulatory Visit | Attending: Family Medicine | Admitting: Family Medicine

## 2019-11-19 ENCOUNTER — Other Ambulatory Visit: Payer: Self-pay | Admitting: Family Medicine

## 2019-11-19 DIAGNOSIS — R5383 Other fatigue: Secondary | ICD-10-CM

## 2019-11-19 DIAGNOSIS — R0602 Shortness of breath: Secondary | ICD-10-CM

## 2019-11-19 DIAGNOSIS — Z1231 Encounter for screening mammogram for malignant neoplasm of breast: Secondary | ICD-10-CM

## 2019-11-19 DIAGNOSIS — R079 Chest pain, unspecified: Secondary | ICD-10-CM

## 2019-12-21 ENCOUNTER — Other Ambulatory Visit: Payer: Self-pay | Admitting: Family Medicine

## 2019-12-21 ENCOUNTER — Ambulatory Visit
Admission: RE | Admit: 2019-12-21 | Discharge: 2019-12-21 | Disposition: A | Discharge status: Home / Self Care | Payer: Medicare Other | Source: Ambulatory Visit | Attending: Family Medicine | Admitting: Family Medicine

## 2019-12-21 ENCOUNTER — Other Ambulatory Visit: Payer: Self-pay

## 2019-12-21 DIAGNOSIS — B59 Pneumocystosis: Secondary | ICD-10-CM

## 2020-01-08 ENCOUNTER — Other Ambulatory Visit: Payer: Self-pay

## 2020-01-08 ENCOUNTER — Ambulatory Visit
Admission: RE | Admit: 2020-01-08 | Discharge: 2020-01-08 | Disposition: A | Discharge status: Home / Self Care | Payer: Medicare Other | Source: Ambulatory Visit | Attending: Family Medicine | Admitting: Family Medicine

## 2020-01-08 DIAGNOSIS — Z1231 Encounter for screening mammogram for malignant neoplasm of breast: Secondary | ICD-10-CM

## 2020-01-24 ENCOUNTER — Ambulatory Visit: Payer: Medicare PPO

## 2020-01-25 ENCOUNTER — Ambulatory Visit
Admission: RE | Admit: 2020-01-25 | Discharge: 2020-01-25 | Disposition: A | Discharge status: Home / Self Care | Payer: Medicare PPO | Source: Ambulatory Visit | Attending: Family Medicine | Admitting: Family Medicine

## 2020-01-25 ENCOUNTER — Other Ambulatory Visit: Payer: Self-pay | Admitting: Family Medicine

## 2020-01-25 DIAGNOSIS — R05 Cough: Secondary | ICD-10-CM

## 2020-01-25 DIAGNOSIS — R059 Cough, unspecified: Secondary | ICD-10-CM

## 2020-01-31 ENCOUNTER — Ambulatory Visit: Payer: Medicare PPO | Attending: Internal Medicine

## 2020-01-31 DIAGNOSIS — Z23 Encounter for immunization: Secondary | ICD-10-CM | POA: Insufficient documentation

## 2020-01-31 NOTE — Progress Notes (Signed)
   Covid-19 Vaccination Clinic  Name:  KAITLIN ALCINDOR    MRN: [id number] DOB: [birth year]  01/31/2020  Ms. Rockhold was observed post Covid-19 immunization for 15 minutes without incidence. She was provided with Vaccine Information Sheet and instruction to access the V-Safe system.   Ms. Frankie was instructed to call 911 with any severe reactions post vaccine: Marland Kitchen Difficulty breathing  . Swelling of your face and throat  . A fast heartbeat  . A bad rash all over your body  . Dizziness and weakness    Immunizations Administered    Name Date Dose VIS Date Route   Pfizer COVID-19 Vaccine 01/31/2020 11:06 AM 0.3 mL 11/20/2019 Intramuscular   Manufacturer: [employer], [employer]   Lot: [id number]   NDC: [address]-2

## 2020-02-18 ENCOUNTER — Ambulatory Visit: Payer: Self-pay | Admitting: *Deleted

## 2020-02-18 NOTE — Telephone Encounter (Signed)
Per initial encounter, Patient was rescheduled for her 2nd dose appt, patient cannot make the 17th. Offered patient an appt on 3/30 patient wants to make sure that would be okay"; pt contacted; explained to pt that 2nd dose she ideally be given at 21 day window, but it can be delayed up to 6 weeks; after that the series has to be restarted; pt offered and accepted appt 03/01/20, [facility], 03/01/20 at 0815; she verbalized understanding.   Reason for Disposition . General information question, no triage required and triager able to answer question  Answer Assessment - Initial Assessment Questions 1. REASON FOR CALL or QUESTION: "What is your reason for calling today?" or "How can I best help you?" or "What question do you have that I can help answer?"     Rescheduling 2nd COVID vaccine  Protocols used: INFORMATION ONLY CALL - NO TRIAGE-A-AH

## 2020-02-24 ENCOUNTER — Ambulatory Visit: Payer: Medicare PPO

## 2020-03-01 ENCOUNTER — Ambulatory Visit: Payer: Medicare PPO | Attending: Internal Medicine

## 2020-03-01 DIAGNOSIS — Z23 Encounter for immunization: Secondary | ICD-10-CM

## 2020-03-01 NOTE — Progress Notes (Signed)
   Covid-19 Vaccination Clinic  Name:  Marie Ford    MRN: [id number] DOB: Apr 30, 1946  03/01/2020  [name] was observed post Covid-19 immunization for 15 minutes without incident. She was provided with Vaccine Information Sheet and instruction to access the V-Safe system.   [name] was instructed to call 911 with any severe reactions post vaccine: Marland Kitchen Difficulty breathing  . Swelling of face and throat  . A fast heartbeat  . A bad rash all over body  . Dizziness and weakness   Immunizations Administered    Name Date Dose VIS Date Route   Pfizer COVID-19 Vaccine 03/01/2020  8:52 AM 0.3 mL 11/20/2019 Intramuscular   Manufacturer: [employer], [employer]   Lot: [id number]   NDC: [address]-4

## 2020-03-31 ENCOUNTER — Ambulatory Visit (INDEPENDENT_AMBULATORY_CARE_PROVIDER_SITE_OTHER): Payer: Medicare PPO | Admitting: Otolaryngology

## 2020-03-31 ENCOUNTER — Other Ambulatory Visit: Payer: Self-pay

## 2020-03-31 ENCOUNTER — Encounter (INDEPENDENT_AMBULATORY_CARE_PROVIDER_SITE_OTHER): Payer: Self-pay | Admitting: Otolaryngology

## 2020-03-31 VITALS — Temp 97.2°F

## 2020-03-31 DIAGNOSIS — J322 Chronic ethmoidal sinusitis: Secondary | ICD-10-CM

## 2020-03-31 NOTE — Progress Notes (Signed)
HPI: Marie Ford is a 74 y.o. female who presents is referred by [name] for evaluation of chronic sinus infection.  Patient apparently has had chronic symptoms of a sinus infection for over a month and a half including headache more in the top and back of the head chronic fatigue.  She had been treated with a round of antibiotics and steroids which helped a little bit.  She recently had a CT scan performed at [facility] on 03/22/2020 that demonstrated complete opacification of the left sphenoid sinus and frothy material in the posterior left ethmoid air cells. She also brings records with her from last year when she saw Dr. Lazarus Salines and he had a CT scan that apparently demonstrated opacification of the right sphenoid sinus.  Unfortunately I am unable to visualize any of the CT scans in the office today. She denies any fever.  She denies any yellow-green discharge from her nose.  She mostly complains of feeling chronically fatigued.. Of note she was treated with Avelox for a couple weeks back in December of last year for pneumonia.  Past Medical History:  Diagnosis Date  . Degenerative arthritis of hip    requirde surgery 2009  . Depression 2010  . Diplopia 2011   in left lateral gaze   . Diplopia    " my eyes dont track together, i have corrective prisms in my eye glasses"   . Diverticulosis 2006   noted in a colonoscopy  . Esophageal reflux 2013  . IGT (impaired glucose tolerance) 2011   denies hx of diabetes   . Insomnia 2009  . Osteoarthritis   . Osteopenia 2005  . Other and unspecified hyperlipidemia 2000  . Other specified disease of sebaceous glands   . Palpitations 2009   secondary to PSVT  . PNA (pneumonia) 2014  . Psoriasis 2004  . PSVT (paroxysmal supraventricular tachycardia) (HCC) 2009   "report it has been some time since i had that "   . Situational stress   . Venous insufficiency since 2017   Past Surgical History:  Procedure Laterality Date  . CATARACT  EXTRACTION W/ INTRAOCULAR LENS  IMPLANT, BILATERAL    . HEMORRHOID SURGERY    . LASIK    . left medial and superior rectus muscle recesion for diplopia correction    . surgery of right eye rectus muscles for diplpia correction   2019  . TOTAL HIP ARTHROPLASTY Right 2010  . TOTAL HIP ARTHROPLASTY Left 08/19/2019   Procedure: TOTAL HIP ARTHROPLASTY ANTERIOR APPROACH;  Surgeon: Ollen Gross, MD;  Location: [facility];  Service: Orthopedics;  Laterality: Left;   . URETHRAL SLING     midurethral sling with TVT Exact and Cystoscopy   Social History   Socioeconomic History  . Marital status: Single    Spouse name: Not on file  . Number of children: Not on file  . Years of education: Not on file  . Highest education level: Not on file  Occupational History  . Not on file  Tobacco Use  . Smoking status: Never Smoker  . Smokeless tobacco: Never Used  Substance and Sexual Activity  . Alcohol use: Yes    Comment: rarely   . Drug use: No  . Sexual activity: Not on file  Other Topics Concern  . Not on file  Social History Narrative  . Not on file   Social Determinants of Health   Financial Resource Strain:   . Difficulty of Paying Living Expenses:  Food Insecurity:   . Worried About Charity fundraiser in the Last Year:   . Arboriculturist in the Last Year:   Transportation Needs:   . Film/video editor (Medical):   Marland Kitchen Lack of Transportation (Non-Medical):   Physical Activity:   . Days of Exercise per Week:   . Minutes of Exercise per Session:   Stress:   . Feeling of Stress :   Social Connections:   . Frequency of Communication with Friends and Family:   . Frequency of Social Gatherings with Friends and Family:   . Attends Religious Services:   . Active Member of Clubs or Organizations:   . Attends Archivist Meetings:   Marland Kitchen Marital Status:    Family History  Problem Relation Age of Onset  . Congestive Heart Failure Mother   . CVA Father   . Heart  failure Father   . Liver cancer Brother   . Colon cancer Brother   . Colitis Brother   . Non-Hodgkin's lymphoma Sister   . Hypertension Brother   . Gout Brother   . Alcohol abuse Sister   . Melanoma Daughter    Allergies  Allergen Reactions  . Erythromycin Shortness Of Breath and Rash  . Nitrofurantoin Other (See Comments)    Pass out  . Penicillins Rash    Did it involve swelling of the face/tongue/throat, SOB, or low BP? No Did it involve sudden or severe rash/hives, skin peeling, or any reaction on the inside of your mouth or nose? No Did you need to seek medical attention at a hospital or doctor's office? No When did it last happen? More than 10 years ago If all above answers are "NO", may proceed with cephalosporin use.    Prior to Admission medications   Medication Sig Start Date End Date Taking? Authorizing Provider  aspirin EC 325 MG EC tablet Take 1 tablet (325 mg total) by mouth 2 (two) times daily. 08/20/19  Yes Constable, Amber, PA-C  Calcium Carbonate-Vitamin D3 (CALCIUM 600+D3) 600-400 MG-UNIT TABS Take 1 tablet by mouth daily.   Yes [provider]  ezetimibe (ZETIA) 10 MG tablet Take 10 mg by mouth daily.    Yes [provider]  fluticasone (FLONASE) 50 MCG/ACT nasal spray Place 1 spray into both nostrils daily.   Yes [provider]  HYDROcodone-acetaminophen (NORCO/VICODIN) 5-325 MG tablet Take 1-2 tablets by mouth every 6 (six) hours as needed for severe pain. 08/20/19  Yes Constable, Amber, PA-C  methocarbamol (ROBAXIN) 500 MG tablet Take 1 tablet (500 mg total) by mouth every 6 (six) hours as needed for muscle spasms. 08/20/19  Yes Constable, Museum/gallery conservator, PA-C  Multiple Vitamin (MULTIVITAMIN WITH MINERALS) TABS tablet Take 1 tablet by mouth daily. Women's Multivitamin   Yes [provider]  omeprazole (PRILOSEC) 20 MG capsule Take 20 mg by mouth daily as needed (with meloxicam use).   Yes [provider]  Polyethyl  Glycol-Propyl Glycol (SYSTANE) 0.4-0.3 % SOLN Place 1 drop into both eyes 3 (three) times daily as needed (dry/irritated eyes.).   Yes [provider]  pravastatin (PRAVACHOL) 80 MG tablet Take 80 mg by mouth every evening.    Yes [provider]  traMADol (ULTRAM) 50 MG tablet Take 1-2 tablets (50-100 mg total) by mouth every 6 (six) hours as needed for moderate pain. 08/20/19  Yes Constable, Amber, PA-C  triamterene-hydrochlorothiazide (MAXZIDE-25) 37.5-25 MG tablet Take 1 tablet by mouth daily.   Yes [provider]  trimethoprim (TRIMPEX) 100 MG tablet Take 100 mg by mouth daily.   Yes [provider]     Positive ROS: Otherwise negative.  All other systems have been reviewed and were otherwise negative with the exception of those mentioned in the HPI and as above.  Physical Exam: Constitutional: Alert, well-appearing, no acute distress Ears: External ears without lesions or tenderness. Ear canals are clear bilaterally with intact, clear TMs.  Nasal: External nose without lesions. Septum relatively midline with mild rhinitis.. Clear nasal passages with no obvious mucopurulent discharge noted on exam. On nasal endoscopy both middle meatus regions were clear.  On examination of the region of the sphenoid ostia no obvious mucopurulent discharge noted. Oral: Lips and gums without lesions. Tongue and palate mucosa without lesions. Posterior oropharynx clear. Neck: No palpable adenopathy or masses Respiratory: Breathing comfortably  Skin: No facial/neck lesions or rash noted.  Nasal/sinus endoscopy  Date/Time: 03/31/2020 5:18 PM Performed by: Drema Halon, MD Authorized by: Drema Halon, MD   Consent:    Consent obtained:  Verbal   Consent given by:  Patient Procedure details:    Indications: sino-nasal symptoms     Medication:  Afrin   Scope location: bilateral nare   Sinus:    Right middle meatus: normal     Left middle meatus:  normal   Comments:     On sinonasal endoscopy I cannot identify any definite mucopurulent discharge from the middle meatus, posterior ethmoid or sphenoid region on either side.    Assessment: Chronic sphenoid sinus disease noted on recent CT scans.  Plan: We will plan on treating her for chronic sinusitis with Ceftin 200 mg twice daily for 2 weeks.  Sterapred 10 mg 6-day Dosepak.  And recommended regular use of nasal steroid spray.  Flonase. We will plan on scheduling a repeat CT scan of the sinuses in 2 weeks and have her follow-up following the CT scan to see if this clears.  I will also plan on reviewing the previous CT scans she has had with Dr. Lazarus Salines as well as the more recent one at [facility].   [name], MD   CC:

## 2020-04-01 ENCOUNTER — Other Ambulatory Visit (INDEPENDENT_AMBULATORY_CARE_PROVIDER_SITE_OTHER): Payer: Self-pay

## 2020-04-01 DIAGNOSIS — J329 Chronic sinusitis, unspecified: Secondary | ICD-10-CM

## 2020-04-14 ENCOUNTER — Ambulatory Visit
Admission: RE | Admit: 2020-04-14 | Discharge: 2020-04-14 | Disposition: A | Discharge status: Home / Self Care | Payer: Medicare PPO | Source: Ambulatory Visit | Attending: Otolaryngology | Admitting: Otolaryngology

## 2020-04-14 DIAGNOSIS — J329 Chronic sinusitis, unspecified: Secondary | ICD-10-CM

## 2020-04-15 ENCOUNTER — Encounter (INDEPENDENT_AMBULATORY_CARE_PROVIDER_SITE_OTHER): Payer: Self-pay | Admitting: Otolaryngology

## 2020-04-15 ENCOUNTER — Other Ambulatory Visit: Payer: Self-pay

## 2020-04-15 ENCOUNTER — Ambulatory Visit (INDEPENDENT_AMBULATORY_CARE_PROVIDER_SITE_OTHER): Payer: Medicare PPO | Admitting: Otolaryngology

## 2020-04-15 VITALS — Temp 97.7°F

## 2020-04-15 DIAGNOSIS — J329 Chronic sinusitis, unspecified: Secondary | ICD-10-CM | POA: Diagnosis not present

## 2020-04-15 DIAGNOSIS — J323 Chronic sphenoidal sinusitis: Secondary | ICD-10-CM

## 2020-04-15 NOTE — Progress Notes (Signed)
HPI: LENAH MESSENGER is a 74 y.o. female who returns today for evaluation of chronic left sphenoid sinus disease.  I reviewed the recent CT scan of her sinuses with the patient in the office today.  I reviewed the CT scan of the sinuses this showed clear paranasal sinuses with aplastic left frontal sinus.  On evaluation of the left sphenoid sinus she has a very small patent ostia but has opacification of the sphenoid sinus with very thick bone surrounding the sinus indicative of chronic left sinus disease that she apparently has had for years. She has had no fever.  No complaints of pain or headache behind the left eye.  No mucopurulent discharge from her nose or coughing up any thick mucus..  Past Medical History:  Diagnosis Date  . Degenerative arthritis of hip    requirde surgery 2009  . Depression 2010  . Diplopia 2011   in left lateral gaze   . Diplopia    " my eyes dont track together, i have corrective prisms in my eye glasses"   . Diverticulosis 2006   noted in a colonoscopy  . Esophageal reflux 2013  . IGT (impaired glucose tolerance) 2011   denies hx of diabetes   . Insomnia 2009  . Osteoarthritis   . Osteopenia 2005  . Other and unspecified hyperlipidemia 2000  . Other specified disease of sebaceous glands   . Palpitations 2009   secondary to PSVT  . PNA (pneumonia) 2014  . Psoriasis 2004  . PSVT (paroxysmal supraventricular tachycardia) (HCC) 2009   "report it has been some time since i had that "   . Situational stress   . Venous insufficiency since 2017   Past Surgical History:  Procedure Laterality Date  . CATARACT EXTRACTION W/ INTRAOCULAR LENS  IMPLANT, BILATERAL    . HEMORRHOID SURGERY    . LASIK    . left medial and superior rectus muscle recesion for diplopia correction    . surgery of right eye rectus muscles for diplpia correction   2019  . TOTAL HIP ARTHROPLASTY Right 2010  . TOTAL HIP ARTHROPLASTY Left 08/19/2019   Procedure: TOTAL HIP ARTHROPLASTY  ANTERIOR APPROACH;  Surgeon: Ollen Gross, MD;  Location: [facility];  Service: Orthopedics;  Laterality: Left;   . URETHRAL SLING     midurethral sling with TVT Exact and Cystoscopy   Social History   Socioeconomic History  . Marital status: Single    Spouse name: Not on file  . Number of children: Not on file  . Years of education: Not on file  . Highest education level: Not on file  Occupational History  . Not on file  Tobacco Use  . Smoking status: Never Smoker  . Smokeless tobacco: Never Used  Substance and Sexual Activity  . Alcohol use: Yes    Comment: rarely   . Drug use: No  . Sexual activity: Not on file  Other Topics Concern  . Not on file  Social History Narrative  . Not on file   Social Determinants of Health   Financial Resource Strain:   . Difficulty of Paying Living Expenses:   Food Insecurity:   . Worried About Programme researcher, broadcasting/film/video in the Last Year:   . Barista in the Last Year:   Transportation Needs:   . Freight forwarder (Medical):   Marland Kitchen Lack of Transportation (Non-Medical):   Physical Activity:   . Days of Exercise per Week:   . Minutes  of Exercise per Session:   Stress:   . Feeling of Stress :   Social Connections:   . Frequency of Communication with Friends and Family:   . Frequency of Social Gatherings with Friends and Family:   . Attends Religious Services:   . Active Member of Clubs or Organizations:   . Attends Archivist Meetings:   Marland Kitchen Marital Status:    Family History  Problem Relation Age of Onset  . Congestive Heart Failure Mother   . CVA Father   . Heart failure Father   . Liver cancer Brother   . Colon cancer Brother   . Colitis Brother   . Non-Hodgkin's lymphoma Sister   . Hypertension Brother   . Gout Brother   . Alcohol abuse Sister   . Melanoma Daughter    Allergies  Allergen Reactions  . Erythromycin Shortness Of Breath and Rash  . Nitrofurantoin Other (See Comments)    Pass out  .  Penicillins Rash    Did it involve swelling of the face/tongue/throat, SOB, or low BP? No Did it involve sudden or severe rash/hives, skin peeling, or any reaction on the inside of your mouth or nose? No Did you need to seek medical attention at a hospital or doctor's office? No When did it last happen? More than 10 years ago If all above answers are "NO", may proceed with cephalosporin use.    Prior to Admission medications   Medication Sig Start Date End Date Taking? Authorizing Provider  aspirin EC 325 MG EC tablet Take 1 tablet (325 mg total) by mouth 2 (two) times daily. 08/20/19  Yes Constable, Amber, PA-C  Calcium Carbonate-Vitamin D3 (CALCIUM 600+D3) 600-400 MG-UNIT TABS Take 1 tablet by mouth daily.   Yes [provider]  ezetimibe (ZETIA) 10 MG tablet Take 10 mg by mouth daily.    Yes [provider]  fluticasone (FLONASE) 50 MCG/ACT nasal spray Place 1 spray into both nostrils daily.   Yes [provider]  HYDROcodone-acetaminophen (NORCO/VICODIN) 5-325 MG tablet Take 1-2 tablets by mouth every 6 (six) hours as needed for severe pain. 08/20/19  Yes Constable, Amber, PA-C  methocarbamol (ROBAXIN) 500 MG tablet Take 1 tablet (500 mg total) by mouth every 6 (six) hours as needed for muscle spasms. 08/20/19  Yes Constable, Museum/gallery conservator, PA-C  Multiple Vitamin (MULTIVITAMIN WITH MINERALS) TABS tablet Take 1 tablet by mouth daily. Women's Multivitamin   Yes [provider]  omeprazole (PRILOSEC) 20 MG capsule Take 20 mg by mouth daily as needed (with meloxicam use).   Yes [provider]  Polyethyl Glycol-Propyl Glycol (SYSTANE) 0.4-0.3 % SOLN Place 1 drop into both eyes 3 (three) times daily as needed (dry/irritated eyes.).   Yes [provider]  pravastatin (PRAVACHOL) 80 MG tablet Take 80 mg by mouth every evening.    Yes [provider]  traMADol (ULTRAM) 50 MG tablet Take 1-2 tablets (50-100 mg total) by mouth every 6 (six) hours as  needed for moderate pain. 08/20/19  Yes Constable, Amber, PA-C  triamterene-hydrochlorothiazide (MAXZIDE-25) 37.5-25 MG tablet Take 1 tablet by mouth daily.   Yes [provider]  trimethoprim (TRIMPEX) 100 MG tablet Take 100 mg by mouth daily.   Yes [provider]     Positive ROS: Otherwise negative  All other systems have been reviewed and were otherwise negative with the exception of those mentioned in the HPI and as above.  Physical Exam: Constitutional: Alert, well-appearing, no acute distress Ears: External  ears without lesions or tenderness. Ear canals are clear bilaterally with intact, clear TMs.  Nasal: External nose without lesions. Septum midline.  Nasal cavity on the left side is clear.  Posterior nasal cavity is clear.  No obvious mucopurulent discharge noted.  Oral exam is clear with no obvious discharge in the posterior oral cavity. Oral: Lips and gums without lesions. Tongue and palate mucosa without lesions. Posterior oropharynx clear. Neck: No palpable adenopathy or masses Respiratory: Breathing comfortably  Skin: No facial/neck lesions or rash noted.  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  Presently not causing any pain or symptoms related to sphenoid sinus disease.  She complains of feeling rundown but I am not sure this is related to the sphenoid disease process.  Plan: Reviewed with her that if she had recurrent infections where she had yellow-green discharge that she was coughing up or blowing out of her nose or if she was having a lot of headaches or pain in the top of her head or behind her left eye would recommend consideration of surgery to open up the sphenoid ostium more.  But she has very thickened sphenoid walls indicative of a chronic process that she has had for years. Would recommend regular use of nasal steroid spray as this will help reduce any swelling and help improve sinus  patency. On review of the CT scan the other paranasal sinuses are all clear. She will return if she develops any persistent headache or recurrent acute infections to discuss possible surgical intervention if needed but presently would recommend just observation.   [name], MD

## 2020-09-06 ENCOUNTER — Other Ambulatory Visit: Payer: Self-pay | Admitting: Family Medicine

## 2020-09-06 DIAGNOSIS — R110 Nausea: Secondary | ICD-10-CM

## 2020-09-06 DIAGNOSIS — R519 Headache, unspecified: Secondary | ICD-10-CM

## 2020-09-06 DIAGNOSIS — R270 Ataxia, unspecified: Secondary | ICD-10-CM

## 2020-09-30 ENCOUNTER — Other Ambulatory Visit: Payer: Self-pay

## 2020-09-30 ENCOUNTER — Ambulatory Visit
Admission: RE | Admit: 2020-09-30 | Discharge: 2020-09-30 | Disposition: A | Discharge status: Home / Self Care | Payer: Medicare PPO | Source: Ambulatory Visit | Attending: Family Medicine | Admitting: Family Medicine

## 2020-09-30 DIAGNOSIS — R270 Ataxia, unspecified: Secondary | ICD-10-CM

## 2020-09-30 DIAGNOSIS — R110 Nausea: Secondary | ICD-10-CM

## 2020-09-30 DIAGNOSIS — R519 Headache, unspecified: Secondary | ICD-10-CM

## 2020-09-30 MED ORDER — GADOBENATE DIMEGLUMINE 529 MG/ML IV SOLN
15.0000 mL | Freq: Once | INTRAVENOUS | Status: AC | PRN
  Administered 2020-09-30: 15 mL via INTRAVENOUS

## 2020-10-19 ENCOUNTER — Ambulatory Visit (AMBULATORY_SURGERY_CENTER): Payer: Self-pay | Admitting: *Deleted

## 2020-10-19 ENCOUNTER — Other Ambulatory Visit: Payer: Self-pay

## 2020-10-19 VITALS — Ht 66.0 in | Wt 161.0 lb

## 2020-10-19 DIAGNOSIS — Z1211 Encounter for screening for malignant neoplasm of colon: Secondary | ICD-10-CM

## 2020-10-19 DIAGNOSIS — Z800 Family history of malignant neoplasm of digestive organs: Secondary | ICD-10-CM

## 2020-10-19 MED ORDER — NA SULFATE-K SULFATE-MG SULF 17.5-3.13-1.6 GM/177ML PO SOLN: ORAL | 0 refills | Status: DC

## 2020-10-19 NOTE — Progress Notes (Signed)
Patient is here in-person for PV. Patient denies any allergies to eggs or soy. Patient denies any problems with anesthesia/sedation. Patient denies any oxygen use at home. Patient denies taking any diet/weight loss medications or blood thinners. Patient is not being treated for MRSA or C-diff. Patient is aware of our care-partner policy and Covid-19 safety protocol. EMMI education assigned to the patient for the procedure, sent to MyChart.   COVID-19 vaccines completed on 03/01/20, per patient.

## 2020-11-01 ENCOUNTER — Other Ambulatory Visit (INDEPENDENT_AMBULATORY_CARE_PROVIDER_SITE_OTHER): Payer: Self-pay

## 2020-11-01 ENCOUNTER — Encounter (INDEPENDENT_AMBULATORY_CARE_PROVIDER_SITE_OTHER): Payer: Self-pay | Admitting: Otolaryngology

## 2020-11-01 ENCOUNTER — Ambulatory Visit (INDEPENDENT_AMBULATORY_CARE_PROVIDER_SITE_OTHER): Payer: Medicare PPO | Admitting: Otolaryngology

## 2020-11-01 ENCOUNTER — Other Ambulatory Visit: Payer: Self-pay

## 2020-11-01 VITALS — Temp 97.2°F

## 2020-11-01 DIAGNOSIS — G8929 Other chronic pain: Secondary | ICD-10-CM | POA: Diagnosis not present

## 2020-11-01 DIAGNOSIS — J323 Chronic sphenoidal sinusitis: Secondary | ICD-10-CM

## 2020-11-01 DIAGNOSIS — R519 Headache, unspecified: Secondary | ICD-10-CM | POA: Diagnosis not present

## 2020-11-01 MED ORDER — TRIAMCINOLONE ACETONIDE 55 MCG/ACT NA AERO: 2.0000 | INHALATION_SPRAY | Freq: Every day | NASAL | 12 refills | Status: AC

## 2020-11-01 NOTE — Progress Notes (Addendum)
HPI: Marie Ford is a 74 y.o. female who returns today for evaluation of chronic headache that she has had for over a year now.  She also describes some dizziness and low energy.  She has been on multiple antibiotics over the past year because of chronic sphenoid sinus disease.  She had a CT scan performed in May that showed obstruction of the left sphenoid sinus and a follow-up MRI scan performed a month ago that was normal except for obstruction of the left sphenoid sinus. She is otherwise healthy.  She takes medication for hypertension and cholesterol. She is on no blood thinners.  No history of cardiac disease or diabetes. She has been on several antibiotics over the past year and does not do well with Avelox, Cipro and some penicillins. She states that she does okay with Keflex and Z-Pak.  Past Medical History:  Diagnosis Date  . Chronic kidney disease    stage 3   . Degenerative arthritis of hip    requirde surgery 2009  . Depression 2010  . Diplopia 2011   in left lateral gaze   . Diplopia    " my eyes dont track together, i have corrective prisms in my eye glasses"   . Diverticulosis 2006   noted in a colonoscopy  . Esophageal reflux 2013  . IGT (impaired glucose tolerance) 2011   denies hx of diabetes   . Insomnia 2009  . Osteoarthritis   . Osteopenia 2005  . Other and unspecified hyperlipidemia 2000  . Other specified disease of sebaceous glands   . Palpitations 2009   secondary to PSVT  . PNA (pneumonia) 2014  . Psoriasis 2004  . PSVT (paroxysmal supraventricular tachycardia) (HCC) 2009   "report it has been some time since i had that "   . Situational stress   . Venous insufficiency since 2017   Past Surgical History:  Procedure Laterality Date  . CATARACT EXTRACTION W/ INTRAOCULAR LENS  IMPLANT, BILATERAL    . COLONOSCOPY  01/30/2010   Jarold Motto  . HEMORRHOID SURGERY    . LASIK    . left medial and superior rectus muscle recesion for diplopia correction     . surgery of right eye rectus muscles for diplpia correction   2019  . TOTAL HIP ARTHROPLASTY Right 2010  . TOTAL HIP ARTHROPLASTY Left 08/19/2019   Procedure: TOTAL HIP ARTHROPLASTY ANTERIOR APPROACH;  Surgeon: Ollen Gross, MD;  Location: [facility];  Service: Orthopedics;  Laterality: Left;   . URETHRAL SLING     midurethral sling with TVT Exact and Cystoscopy   Social History   Socioeconomic History  . Marital status: Single    Spouse name: Not on file  . Number of children: Not on file  . Years of education: Not on file  . Highest education level: Not on file  Occupational History  . Not on file  Tobacco Use  . Smoking status: Never Smoker  . Smokeless tobacco: Never Used  Vaping Use  . Vaping Use: Never used  Substance and Sexual Activity  . Alcohol use: Yes    Comment: occ wine  . Drug use: No  . Sexual activity: Not on file  Other Topics Concern  . Not on file  Social History Narrative  . Not on file   Social Determinants of Health   Financial Resource Strain:   . Difficulty of Paying Living Expenses: Not on file  Food Insecurity:   . Worried About Programme researcher, broadcasting/film/video  in the Last Year: Not on file  . Ran Out of Food in the Last Year: Not on file  Transportation Needs:   . Lack of Transportation (Medical): Not on file  . Lack of Transportation (Non-Medical): Not on file  Physical Activity:   . Days of Exercise per Week: Not on file  . Minutes of Exercise per Session: Not on file  Stress:   . Feeling of Stress : Not on file  Social Connections:   . Frequency of Communication with Friends and Family: Not on file  . Frequency of Social Gatherings with Friends and Family: Not on file  . Attends Religious Services: Not on file  . Active Member of Clubs or Organizations: Not on file  . Attends Banker Meetings: Not on file  . Marital Status: Not on file   Family History  Problem Relation Age of Onset  . Congestive Heart Failure Mother   .  CVA Father   . Heart failure Father   . Liver cancer Brother   . Colon cancer Brother 31  . Colitis Brother   . Non-Hodgkin's lymphoma Sister   . Hypertension Brother   . Gout Brother   . Alcohol abuse Sister   . Melanoma Daughter   . Esophageal cancer Neg Hx   . Rectal cancer Neg Hx   . Stomach cancer Neg Hx   . Colon polyps Neg Hx    Allergies  Allergen Reactions  . Erythromycin Shortness Of Breath and Rash  . Macrobid [Nitrofurantoin] Other (See Comments)    Pass out  . Penicillins Rash    Did it involve swelling of the face/tongue/throat, SOB, or low BP? No Did it involve sudden or severe rash/hives, skin peeling, or any reaction on the inside of your mouth or nose? No Did you need to seek medical attention at a hospital or doctor's office? No When did it last happen? More than 10 years ago If all above answers are "NO", may proceed with cephalosporin use.   . Sulfamethoxazole-Trimethoprim Rash   Prior to Admission medications   Medication Sig Start Date End Date Taking? Authorizing Provider  Calcium Carbonate-Vitamin D3 (CALCIUM 600+D3) 600-400 MG-UNIT TABS Take 1 tablet by mouth every other day.     [provider]  citalopram (CELEXA) 20 MG tablet  06/29/20   [provider]  estrogens, conjugated, (PREMARIN) 0.625 MG tablet Take 0.625 mg by mouth daily. Take daily for 21 days then do not take for 7 days.    [provider]  ezetimibe (ZETIA) 10 MG tablet ezetimibe 10 mg tablet  Take 1 tablet every day by oral route. 02/25/19   [provider]  FLUoxetine (PROZAC) 20 MG capsule  10/10/20   [provider]  fluticasone (FLONASE) 50 MCG/ACT nasal spray Place 1 spray into both nostrils daily.    [provider]  Multiple Vitamin (MULTIVITAMIN WITH MINERALS) TABS tablet Take 1 tablet by mouth daily. Women's Multivitamin    [provider]  Na Sulfate-K Sulfate-Mg Sulf 17.5-3.13-1.6 GM/177ML SOLN Suprep (no  substitutions)-TAKE AS DIRECTED. 10/19/20   Tressia Danas, MD  omeprazole (PRILOSEC) 20 MG capsule Take 20 mg by mouth daily as needed (with meloxicam use).    [provider]  Polyethyl Glycol-Propyl Glycol (SYSTANE) 0.4-0.3 % SOLN Place 1 drop into both eyes 3 (three) times daily as needed (dry/irritated eyes.).    [provider]  pravastatin (PRAVACHOL) 80 MG tablet Take 80 mg by mouth every evening.  [provider]  triamterene-hydrochlorothiazide (MAXZIDE-25) 37.5-25 MG tablet Take 1 tablet by mouth daily.    [provider]     Positive ROS: Otherwise negative  All other systems have been reviewed and were otherwise negative with the exception of those mentioned in the HPI and as above.  Physical Exam: Constitutional: Alert, well-appearing, no acute distress Ears: External ears without lesions or tenderness. Ear canals are clear bilaterally.  TMs are clear bilaterally. Nasal: External nose without lesions. Septum with mild deformity and moderate rhinitis.  After decongesting the nose both millimeters regions were clear..  Oral: Lips and gums without lesions. Tongue and palate mucosa without lesions. Posterior oropharynx clear. Neck: No palpable adenopathy or masses Cardiac exam: Regular rate and rhythm without murmur Lungs: Clear to auscultation. Respiratory: Breathing comfortably  Skin: No facial/neck lesions or rash noted.  Procedures  Assessment: Chronic left sphenoid sinus disease.  Plan: Briefly discussed surgical options with her today in the office.  She would like to proceed with surgery.  We will first need to get a fusion CT scan prior to surgical invention. She has some problems with nosebleeds with use of Flonase and suggested trying Nasacort 2 sprays each nostril at night. We will plan on scheduling surgery following results of the fusion CT scan.  I received notes from [name].  I agree with his assessment that the  sphenoid sinus disease may be the cause of her headaches and discussed surgery with her at our visit yesterday. I will need to schedule fusion CT scan in order to safely proceed with the surgery.   [name], MD

## 2020-11-05 ENCOUNTER — Telehealth: Payer: Self-pay | Admitting: Nurse Practitioner

## 2020-11-05 NOTE — Telephone Encounter (Signed)
Scheduled for colonoscopy Monday am. She has sinus infectious, doesn't feel well and wants to cancel appt. I asked her to call back when feeling better so that we could get colonoscopy rescheduled.

## 2020-11-06 NOTE — Telephone Encounter (Signed)
Noted! Thank you

## 2020-11-07 ENCOUNTER — Other Ambulatory Visit (INDEPENDENT_AMBULATORY_CARE_PROVIDER_SITE_OTHER): Payer: Self-pay

## 2020-11-07 ENCOUNTER — Encounter: Payer: Medicare PPO | Admitting: Gastroenterology

## 2020-11-07 DIAGNOSIS — J323 Chronic sphenoidal sinusitis: Secondary | ICD-10-CM

## 2020-11-09 ENCOUNTER — Ambulatory Visit (INDEPENDENT_AMBULATORY_CARE_PROVIDER_SITE_OTHER): Payer: Self-pay | Admitting: Otolaryngology

## 2020-11-09 DIAGNOSIS — J323 Chronic sphenoidal sinusitis: Secondary | ICD-10-CM

## 2020-11-09 NOTE — H&P (Signed)
PREOPERATIVE H&P  Chief Complaint: Chronic headache  HPI: Marie Ford is a 74 y.o. female who presents for evaluation of chronic headache over the past year.  She has had a CT scan as well as an MRI scan that shows chronic left sphenoid sinus disease with thickening of the sphenoid bone.  Her most recent fusion CT scan demonstrated persistent disease with no improvement despite several rounds of antibiotics over the past 6 months as well as chronic use of nasal steroid spray.  She is taken to the operating room at this time for left sphenoidotomy and irrigation of the left sphenoid sinus.  Past Medical History:  Diagnosis Date  . Chronic kidney disease    stage 3   . Degenerative arthritis of hip    requirde surgery 2009  . Depression 2010  . Diplopia 2011   in left lateral gaze   . Diplopia    " my eyes dont track together, i have corrective prisms in my eye glasses"   . Diverticulosis 2006   noted in a colonoscopy  . Esophageal reflux 2013  . IGT (impaired glucose tolerance) 2011   denies hx of diabetes   . Insomnia 2009  . Osteoarthritis   . Osteopenia 2005  . Other and unspecified hyperlipidemia 2000  . Other specified disease of sebaceous glands   . Palpitations 2009   secondary to PSVT  . PNA (pneumonia) 2014  . Psoriasis 2004  . PSVT (paroxysmal supraventricular tachycardia) (HCC) 2009   "report it has been some time since i had that "   . Situational stress   . Venous insufficiency since 2017   Past Surgical History:  Procedure Laterality Date  . CATARACT EXTRACTION W/ INTRAOCULAR LENS  IMPLANT, BILATERAL    . COLONOSCOPY  01/30/2010   Jarold Motto  . HEMORRHOID SURGERY    . LASIK    . left medial and superior rectus muscle recesion for diplopia correction    . surgery of right eye rectus muscles for diplpia correction   2019  . TOTAL HIP ARTHROPLASTY Right 2010  . TOTAL HIP ARTHROPLASTY Left 08/19/2019   Procedure: TOTAL HIP ARTHROPLASTY ANTERIOR APPROACH;   Surgeon: Ollen Gross, MD;  Location: [facility];  Service: Orthopedics;  Laterality: Left;   . URETHRAL SLING     midurethral sling with TVT Exact and Cystoscopy   Social History   Socioeconomic History  . Marital status: Single    Spouse name: Not on file  . Number of children: Not on file  . Years of education: Not on file  . Highest education level: Not on file  Occupational History  . Not on file  Tobacco Use  . Smoking status: Never Smoker  . Smokeless tobacco: Never Used  Vaping Use  . Vaping Use: Never used  Substance and Sexual Activity  . Alcohol use: Yes    Comment: occ wine  . Drug use: No  . Sexual activity: Not on file  Other Topics Concern  . Not on file  Social History Narrative  . Not on file   Social Determinants of Health   Financial Resource Strain:   . Difficulty of Paying Living Expenses: Not on file  Food Insecurity:   . Worried About Programme researcher, broadcasting/film/video in the Last Year: Not on file  . Ran Out of Food in the Last Year: Not on file  Transportation Needs:   . Lack of Transportation (Medical): Not on file  . Lack of Transportation (Non-Medical):  Not on file  Physical Activity:   . Days of Exercise per Week: Not on file  . Minutes of Exercise per Session: Not on file  Stress:   . Feeling of Stress : Not on file  Social Connections:   . Frequency of Communication with Friends and Family: Not on file  . Frequency of Social Gatherings with Friends and Family: Not on file  . Attends Religious Services: Not on file  . Active Member of Clubs or Organizations: Not on file  . Attends Banker Meetings: Not on file  . Marital Status: Not on file   Family History  Problem Relation Age of Onset  . Congestive Heart Failure Mother   . CVA Father   . Heart failure Father   . Liver cancer Brother   . Colon cancer Brother 47  . Colitis Brother   . Non-Hodgkin's lymphoma Sister   . Hypertension Brother   . Gout Brother   . Alcohol  abuse Sister   . Melanoma Daughter   . Esophageal cancer Neg Hx   . Rectal cancer Neg Hx   . Stomach cancer Neg Hx   . Colon polyps Neg Hx    Allergies  Allergen Reactions  . Erythromycin Shortness Of Breath and Rash  . Macrobid [Nitrofurantoin] Other (See Comments)    Pass out  . Penicillins Rash    Did it involve swelling of the face/tongue/throat, SOB, or low BP? No Did it involve sudden or severe rash/hives, skin peeling, or any reaction on the inside of your mouth or nose? No Did you need to seek medical attention at a hospital or doctor's office? No When did it last happen? More than 10 years ago If all above answers are "NO", may proceed with cephalosporin use.   . Sulfamethoxazole-Trimethoprim Rash   Prior to Admission medications   Medication Sig Start Date End Date Taking? Authorizing Provider  Calcium Carbonate-Vitamin D3 (CALCIUM 600+D3) 600-400 MG-UNIT TABS Take 1 tablet by mouth every other day.     [provider]  citalopram (CELEXA) 20 MG tablet  06/29/20   [provider]  estrogens, conjugated, (PREMARIN) 0.625 MG tablet Take 0.625 mg by mouth daily. Take daily for 21 days then do not take for 7 days.    [provider]  ezetimibe (ZETIA) 10 MG tablet ezetimibe 10 mg tablet  Take 1 tablet every day by oral route. 02/25/19   [provider]  FLUoxetine (PROZAC) 20 MG capsule  10/10/20   [provider]  fluticasone (FLONASE) 50 MCG/ACT nasal spray Place 1 spray into both nostrils daily.    [provider]  Multiple Vitamin (MULTIVITAMIN WITH MINERALS) TABS tablet Take 1 tablet by mouth daily. Women's Multivitamin    [provider]  Na Sulfate-K Sulfate-Mg Sulf 17.5-3.13-1.6 GM/177ML SOLN Suprep (no substitutions)-TAKE AS DIRECTED. 10/19/20   Tressia Danas, MD  omeprazole (PRILOSEC) 20 MG capsule Take 20 mg by mouth daily as needed (with meloxicam use).    [provider]  Polyethyl  Glycol-Propyl Glycol (SYSTANE) 0.4-0.3 % SOLN Place 1 drop into both eyes 3 (three) times daily as needed (dry/irritated eyes.).    [provider]  pravastatin (PRAVACHOL) 80 MG tablet Take 80 mg by mouth every evening.     [provider]  triamcinolone (NASACORT) 55 MCG/ACT AERO nasal inhaler Place 2 sprays into the nose daily. Use at night 11/01/20   Drema Halon, MD  triamterene-hydrochlorothiazide (MAXZIDE-25) 37.5-25 MG tablet Take 1  tablet by mouth daily.    [provider]     Positive ROS: Otherwise negative  All other systems have been reviewed and were otherwise negative with the exception of those mentioned in the HPI and as above.  Physical Exam: There were no vitals filed for this visit.  General: Alert, no acute distress Oral: Normal oral mucosa and tonsils Nasal: Clear nasal passages anteriorly with no polyps noted.  No gross mucopurulent discharge noted. Neck: No palpable adenopathy or thyroid nodules Ear: Ear canal is clear with normal appearing TMs Cardiovascular: Regular rate and rhythm, no murmur.  Respiratory: Clear to auscultation Neurologic: Alert and oriented x 3   Assessment/Plan: CHRONIC SPHENOID SINUSITIS, HEADACHE, TURBINATE HYPERTROPHY Plan for Procedure(s): LEFT SIDED TURBINATE REDUCTION LEFT SIDED TOTAL ETHMOIDECTOMY LEFT SIDED SPHENOIDECTOMY WITH TISSUE REMOVAL SINUS ENDO WITH FUSION   Dillard Cannon, MD 11/09/2020 12:47 PM

## 2020-11-10 ENCOUNTER — Ambulatory Visit
Admission: RE | Admit: 2020-11-10 | Discharge: 2020-11-10 | Disposition: A | Discharge status: Home / Self Care | Payer: Medicare PPO | Source: Ambulatory Visit | Attending: Otolaryngology | Admitting: Otolaryngology

## 2020-11-10 DIAGNOSIS — J323 Chronic sphenoidal sinusitis: Secondary | ICD-10-CM

## 2020-11-11 ENCOUNTER — Encounter (HOSPITAL_BASED_OUTPATIENT_CLINIC_OR_DEPARTMENT_OTHER): Payer: Self-pay | Admitting: Otolaryngology

## 2020-11-11 ENCOUNTER — Other Ambulatory Visit: Payer: Self-pay

## 2020-11-14 ENCOUNTER — Encounter (HOSPITAL_BASED_OUTPATIENT_CLINIC_OR_DEPARTMENT_OTHER)
Admission: RE | Admit: 2020-11-14 | Discharge: 2020-11-14 | Disposition: A | Discharge status: Home / Self Care | Payer: Medicare PPO | Source: Ambulatory Visit | Attending: Otolaryngology | Admitting: Otolaryngology

## 2020-11-14 ENCOUNTER — Other Ambulatory Visit (HOSPITAL_COMMUNITY)
Admission: RE | Admit: 2020-11-14 | Discharge: 2020-11-14 | Disposition: A | Discharge status: Home / Self Care | Payer: Medicare PPO | Source: Ambulatory Visit | Attending: Otolaryngology | Admitting: Otolaryngology

## 2020-11-14 DIAGNOSIS — Z01818 Encounter for other preprocedural examination: Secondary | ICD-10-CM | POA: Insufficient documentation

## 2020-11-14 DIAGNOSIS — Z01812 Encounter for preprocedural laboratory examination: Secondary | ICD-10-CM | POA: Diagnosis present

## 2020-11-14 DIAGNOSIS — Z20822 Contact with and (suspected) exposure to covid-19: Secondary | ICD-10-CM | POA: Insufficient documentation

## 2020-11-14 DIAGNOSIS — I10 Essential (primary) hypertension: Secondary | ICD-10-CM | POA: Insufficient documentation

## 2020-11-14 LAB — BASIC METABOLIC PANEL
Anion gap: 7 (ref 5–15)
BUN: 20 mg/dL (ref 8–23)
CO2: 30 mmol/L (ref 22–32)
Calcium: 9.3 mg/dL (ref 8.9–10.3)
Chloride: 102 mmol/L (ref 98–111)
Creatinine, Ser: 1.15 mg/dL — ABNORMAL HIGH (ref 0.44–1.00)
GFR, Estimated: 49 mL/min — ABNORMAL LOW (ref >60)
Glucose, Bld: 83 mg/dL (ref 70–99)
Potassium: 4 mmol/L (ref 3.5–5.1)
Sodium: 139 mmol/L (ref 135–145)

## 2020-11-14 LAB — SARS CORONAVIRUS 2 (TAT 6-24 HRS): SARS Coronavirus 2: NEGATIVE

## 2020-11-14 NOTE — Progress Notes (Signed)
Surgical soap given with instructions, pt verbalized understanding.  

## 2020-11-17 ENCOUNTER — Encounter (HOSPITAL_BASED_OUTPATIENT_CLINIC_OR_DEPARTMENT_OTHER)
Admission: RE | Disposition: A | Discharge status: Home / Self Care | Payer: Self-pay | Source: Home / Self Care | Attending: Otolaryngology

## 2020-11-17 ENCOUNTER — Encounter (HOSPITAL_BASED_OUTPATIENT_CLINIC_OR_DEPARTMENT_OTHER): Payer: Self-pay | Admitting: Otolaryngology

## 2020-11-17 ENCOUNTER — Ambulatory Visit (HOSPITAL_BASED_OUTPATIENT_CLINIC_OR_DEPARTMENT_OTHER): Payer: Medicare PPO | Admitting: Anesthesiology

## 2020-11-17 ENCOUNTER — Ambulatory Visit (HOSPITAL_BASED_OUTPATIENT_CLINIC_OR_DEPARTMENT_OTHER)
Admission: RE | Admit: 2020-11-17 | Discharge: 2020-11-17 | Disposition: A | Discharge status: Home / Self Care | Payer: Medicare PPO | Attending: Otolaryngology | Admitting: Otolaryngology

## 2020-11-17 DIAGNOSIS — J343 Hypertrophy of nasal turbinates: Secondary | ICD-10-CM

## 2020-11-17 DIAGNOSIS — J3489 Other specified disorders of nose and nasal sinuses: Secondary | ICD-10-CM | POA: Insufficient documentation

## 2020-11-17 DIAGNOSIS — J323 Chronic sphenoidal sinusitis: Secondary | ICD-10-CM

## 2020-11-17 DIAGNOSIS — Z96643 Presence of artificial hip joint, bilateral: Secondary | ICD-10-CM | POA: Insufficient documentation

## 2020-11-17 DIAGNOSIS — R519 Headache, unspecified: Secondary | ICD-10-CM | POA: Diagnosis not present

## 2020-11-17 DIAGNOSIS — Z79890 Hormone replacement therapy (postmenopausal): Secondary | ICD-10-CM | POA: Diagnosis not present

## 2020-11-17 DIAGNOSIS — J322 Chronic ethmoidal sinusitis: Secondary | ICD-10-CM

## 2020-11-17 DIAGNOSIS — Z79899 Other long term (current) drug therapy: Secondary | ICD-10-CM | POA: Insufficient documentation

## 2020-11-17 HISTORY — DX: Chronic sphenoidal sinusitis: J32.3

## 2020-11-17 HISTORY — DX: Essential (primary) hypertension: I10

## 2020-11-17 HISTORY — PX: TURBINATE REDUCTION: SHX6157

## 2020-11-17 HISTORY — PX: SPHENOIDECTOMY: SHX2421

## 2020-11-17 HISTORY — PX: ETHMOIDECTOMY: SHX5197

## 2020-11-17 HISTORY — PX: SINUS ENDO WITH FUSION: SHX5329

## 2020-11-17 SURGERY — REDUCTION, NASAL TURBINATE
Anesthesia: General | Site: Nose | Laterality: Left

## 2020-11-17 MED ORDER — LACTATED RINGERS IV SOLN: INTRAVENOUS | Status: DC

## 2020-11-17 MED ORDER — EPHEDRINE SULFATE 50 MG/ML IJ SOLN
INTRAMUSCULAR | Status: DC | PRN
  Administered 2020-11-17: 10 mg via INTRAVENOUS
  Administered 2020-11-17: 5 mg via INTRAVENOUS
  Administered 2020-11-17: 10 mg via INTRAVENOUS

## 2020-11-17 MED ORDER — CHLORHEXIDINE GLUCONATE CLOTH 2 % EX PADS: 6.0000 | MEDICATED_PAD | Freq: Once | CUTANEOUS | Status: DC

## 2020-11-17 MED ORDER — EPHEDRINE 5 MG/ML INJ
INTRAVENOUS | Status: AC
  Filled 2020-11-17: qty 10

## 2020-11-17 MED ORDER — PROPOFOL 10 MG/ML IV BOLUS
INTRAVENOUS | Status: DC | PRN
  Administered 2020-11-17: 100 mg via INTRAVENOUS
  Administered 2020-11-17: 40 mg via INTRAVENOUS

## 2020-11-17 MED ORDER — MUPIROCIN 2 % EX OINT
TOPICAL_OINTMENT | CUTANEOUS | Status: DC | PRN
  Administered 2020-11-17: 1 via NASAL

## 2020-11-17 MED ORDER — METHYLPREDNISOLONE ACETATE 80 MG/ML IJ SUSP
INTRAMUSCULAR | Status: AC
  Filled 2020-11-17: qty 1

## 2020-11-17 MED ORDER — OXYMETAZOLINE HCL 0.05 % NA SOLN
NASAL | Status: DC | PRN
  Administered 2020-11-17: 1 via TOPICAL

## 2020-11-17 MED ORDER — CEPHALEXIN 500 MG PO CAPS: 500.0000 mg | ORAL_CAPSULE | Freq: Two times a day (BID) | ORAL | 0 refills | Status: DC

## 2020-11-17 MED ORDER — EPINEPHRINE PF 1 MG/ML IJ SOLN
INTRAMUSCULAR | Status: AC
  Filled 2020-11-17: qty 2

## 2020-11-17 MED ORDER — MIDAZOLAM HCL 2 MG/2ML IJ SOLN
INTRAMUSCULAR | Status: AC
  Filled 2020-11-17: qty 2

## 2020-11-17 MED ORDER — PHENYLEPHRINE 40 MCG/ML (10ML) SYRINGE FOR IV PUSH (FOR BLOOD PRESSURE SUPPORT)
PREFILLED_SYRINGE | INTRAVENOUS | Status: AC
  Filled 2020-11-17: qty 10

## 2020-11-17 MED ORDER — PHENYLEPHRINE HCL (PRESSORS) 10 MG/ML IV SOLN
INTRAVENOUS | Status: DC | PRN
  Administered 2020-11-17: 40 ug via INTRAVENOUS

## 2020-11-17 MED ORDER — DEXAMETHASONE SODIUM PHOSPHATE 4 MG/ML IJ SOLN
INTRAMUSCULAR | Status: DC | PRN
  Administered 2020-11-17: 10 mg via INTRAVENOUS

## 2020-11-17 MED ORDER — DEXAMETHASONE SODIUM PHOSPHATE 10 MG/ML IJ SOLN
INTRAMUSCULAR | Status: AC
  Filled 2020-11-17: qty 1

## 2020-11-17 MED ORDER — LIDOCAINE HCL (CARDIAC) PF 100 MG/5ML IV SOSY
PREFILLED_SYRINGE | INTRAVENOUS | Status: DC | PRN
  Administered 2020-11-17: 60 mg via INTRAVENOUS

## 2020-11-17 MED ORDER — LIDOCAINE-EPINEPHRINE 1 %-1:100000 IJ SOLN
INTRAMUSCULAR | Status: DC | PRN
  Administered 2020-11-17: 12 mL

## 2020-11-17 MED ORDER — CEFAZOLIN SODIUM-DEXTROSE 2-4 GM/100ML-% IV SOLN
2.0000 g | INTRAVENOUS | Status: AC
  Administered 2020-11-17: 2 g via INTRAVENOUS

## 2020-11-17 MED ORDER — SUCCINYLCHOLINE CHLORIDE 200 MG/10ML IV SOSY
PREFILLED_SYRINGE | INTRAVENOUS | Status: AC
  Filled 2020-11-17: qty 10

## 2020-11-17 MED ORDER — LIDOCAINE-EPINEPHRINE 1 %-1:100000 IJ SOLN
INTRAMUSCULAR | Status: AC
  Filled 2020-11-17: qty 1

## 2020-11-17 MED ORDER — BACITRACIN ZINC 500 UNIT/GM EX OINT
TOPICAL_OINTMENT | CUTANEOUS | Status: AC
  Filled 2020-11-17: qty 28.35

## 2020-11-17 MED ORDER — MUPIROCIN 2 % EX OINT
TOPICAL_OINTMENT | CUTANEOUS | Status: AC
  Filled 2020-11-17: qty 22

## 2020-11-17 MED ORDER — OXYMETAZOLINE HCL 0.05 % NA SOLN
NASAL | Status: AC
  Filled 2020-11-17: qty 30

## 2020-11-17 MED ORDER — CEFAZOLIN SODIUM-DEXTROSE 2-4 GM/100ML-% IV SOLN
INTRAVENOUS | Status: AC
  Filled 2020-11-17: qty 100

## 2020-11-17 MED ORDER — FENTANYL CITRATE (PF) 100 MCG/2ML IJ SOLN
INTRAMUSCULAR | Status: DC | PRN
  Administered 2020-11-17 (×2): 50 ug via INTRAVENOUS

## 2020-11-17 MED ORDER — ONDANSETRON HCL 4 MG/2ML IJ SOLN
INTRAMUSCULAR | Status: AC
  Filled 2020-11-17: qty 2

## 2020-11-17 MED ORDER — EPINEPHRINE PF 1 MG/ML IJ SOLN
INTRAMUSCULAR | Status: DC | PRN
  Administered 2020-11-17: 1 mg

## 2020-11-17 MED ORDER — SUGAMMADEX SODIUM 200 MG/2ML IV SOLN
INTRAVENOUS | Status: DC | PRN
  Administered 2020-11-17: 150 mg via INTRAVENOUS

## 2020-11-17 MED ORDER — ACETAMINOPHEN 500 MG PO TABS
ORAL_TABLET | ORAL | Status: AC
  Filled 2020-11-17: qty 2

## 2020-11-17 MED ORDER — SODIUM CHLORIDE (PF) 0.9 % IJ SOLN
INTRAMUSCULAR | Status: DC | PRN
  Administered 2020-11-17: 4 mL

## 2020-11-17 MED ORDER — FENTANYL CITRATE (PF) 100 MCG/2ML IJ SOLN
INTRAMUSCULAR | Status: AC
  Filled 2020-11-17: qty 2

## 2020-11-17 MED ORDER — SODIUM CHLORIDE (PF) 0.9 % IJ SOLN
INTRAMUSCULAR | Status: AC
  Filled 2020-11-17: qty 10

## 2020-11-17 MED ORDER — ACETAMINOPHEN 500 MG PO TABS
1000.0000 mg | ORAL_TABLET | Freq: Once | ORAL | Status: AC
  Administered 2020-11-17: 1000 mg via ORAL

## 2020-11-17 MED ORDER — LIDOCAINE 2% (20 MG/ML) 5 ML SYRINGE
INTRAMUSCULAR | Status: AC
  Filled 2020-11-17: qty 5

## 2020-11-17 MED ORDER — SODIUM CHLORIDE 0.9 % IV SOLN
INTRAVENOUS | Status: AC | PRN
  Administered 2020-11-17: 100 mL

## 2020-11-17 MED ORDER — ROCURONIUM BROMIDE 100 MG/10ML IV SOLN
INTRAVENOUS | Status: DC | PRN
  Administered 2020-11-17: 50 mg via INTRAVENOUS

## 2020-11-17 SURGICAL SUPPLY — 99 items
APL SWBSTK 6 STRL LF DISP (MISCELLANEOUS)
APPLICATOR COTTON TIP 6 STRL (MISCELLANEOUS) IMPLANT
APPLICATOR COTTON TIP 6IN STRL (MISCELLANEOUS)
ATTRACTOMAT 16X20 MAGNETIC DRP (DRAPES) ×1 IMPLANT
BALLN SINUPLASTY KIT 6X16 (BALLOONS)
BALLOON SINUPLASTY KIT 6X16 (BALLOONS) IMPLANT
BLADE INF TURB ROT M4 2 5PK (BLADE) ×1 IMPLANT
BLADE INF TURB ROT M4 2MM 5PK (BLADE) ×1
BLADE RAD40 ROTATE 4M 4 5PK (BLADE) IMPLANT
BLADE RAD40 ROTATE 4M 4MM 5PK (BLADE)
BLADE RAD60 ROTATE M4 4 5PK (BLADE) IMPLANT
BLADE RAD60 ROTATE M4 4MM 5PK (BLADE)
BLADE ROTATE RAD 12 4 M4 (BLADE) IMPLANT
BLADE ROTATE RAD 12 4MM M4 (BLADE)
BLADE ROTATE RAD 40 4 M4 (BLADE) IMPLANT
BLADE ROTATE RAD 40 4MM M4 (BLADE)
BLADE ROTATE TRICUT 4MX13CM M4 (BLADE) ×1
BLADE ROTATE TRICUT 4X13 M4 (BLADE) ×2 IMPLANT
BLADE SURG 15 STRL LF DISP TIS (BLADE) ×1 IMPLANT
BLADE SURG 15 STRL SS (BLADE) ×3
BLADE TRICUT ROTATE M4 4 5PK (BLADE) IMPLANT
BLADE TRICUT ROTATE M4 4MM 5PK (BLADE)
BUR HS RAD FRONTAL 3 (BURR) ×1 IMPLANT
BUR HS RAD FRONTAL 3MM (BURR) ×1
BUR TAPER CHOANAL ATRESIA 30K (BURR) ×2 IMPLANT
CANISTER SUC SOCK COL 7IN (MISCELLANEOUS) ×4 IMPLANT
CANISTER SUCT 1200ML W/VALVE (MISCELLANEOUS) ×3 IMPLANT
CATH SINUS BALLN 7X16 (CATHETERS) IMPLANT
CATH SINUS BALLN RELIEV 6X16 (SINUPLASTY) IMPLANT
CATH SINUS GUIDE F-70 (CATHETERS) IMPLANT
CATH SINUS GUIDE M/110 (CATHETERS) IMPLANT
CATH SINUS IRRIGATION 2.0 (CATHETERS) IMPLANT
CLEANER CAUTERY TIP 5X5 PAD (MISCELLANEOUS) IMPLANT
COAGULATOR SUCT 8FR VV (MISCELLANEOUS) ×2 IMPLANT
COVER MAYO STAND STRL (DRAPES) ×3 IMPLANT
COVER PROBE W GEL 5X96 (DRAPES) IMPLANT
COVER WAND RF STERILE (DRAPES) IMPLANT
DECANTER SPIKE VIAL GLASS SM (MISCELLANEOUS) IMPLANT
DEVICE INFLATION 20/61 (MISCELLANEOUS) IMPLANT
DEVICE INFLATION SEID (MISCELLANEOUS) IMPLANT
DRAPE SURG 17X23 STRL (DRAPES) ×1 IMPLANT
DRESSING NASAL KENNEDY 3.5X.9 (MISCELLANEOUS) IMPLANT
DRSG CURAD 3X16 NADH (PACKING) IMPLANT
DRSG NASAL KENNEDY 3.5X.9 (MISCELLANEOUS)
DRSG NASAL KENNEDY LMNT 8CM (GAUZE/BANDAGES/DRESSINGS) IMPLANT
DRSG NASOPORE 8CM (GAUZE/BANDAGES/DRESSINGS) ×2 IMPLANT
DRSG TELFA 3X8 NADH (GAUZE/BANDAGES/DRESSINGS) IMPLANT
ELECT COATED BLADE 2.86 ST (ELECTRODE) IMPLANT
ELECT NDL BLADE 2-5/6 (NEEDLE) ×1 IMPLANT
ELECT NEEDLE BLADE 2-5/6 (NEEDLE) ×3 IMPLANT
ELECT REM PT RETURN 9FT ADLT (ELECTROSURGICAL) ×3
ELECTRODE REM PT RTRN 9FT ADLT (ELECTROSURGICAL) IMPLANT
GLOVE ECLIPSE 6.5 STRL STRAW (GLOVE) ×2 IMPLANT
GLOVE SS BIOGEL STRL SZ 7.5 (GLOVE) ×1 IMPLANT
GLOVE SUPERSENSE BIOGEL SZ 7.5 (GLOVE) ×2
GLOVE SURG UNDER POLY LF SZ7 (GLOVE) ×4 IMPLANT
GOWN STRL REUS W/ TWL LRG LVL3 (GOWN DISPOSABLE) ×2 IMPLANT
GOWN STRL REUS W/ TWL XL LVL3 (GOWN DISPOSABLE) ×1 IMPLANT
GOWN STRL REUS W/TWL LRG LVL3 (GOWN DISPOSABLE) ×6
GOWN STRL REUS W/TWL XL LVL3 (GOWN DISPOSABLE)
HANDLE SINUS GUIDE LP (INSTRUMENTS) IMPLANT
HEMOSTAT SURGICEL .5X2 ABSORB (HEMOSTASIS) IMPLANT
HEMOSTAT SURGICEL 2X14 (HEMOSTASIS) IMPLANT
IV NS 1000ML (IV SOLUTION)
IV NS 1000ML BAXH (IV SOLUTION) IMPLANT
IV NS 500ML (IV SOLUTION) ×6
IV NS 500ML BAXH (IV SOLUTION) ×2 IMPLANT
NDL PRECISIONGLIDE 27X1.5 (NEEDLE) ×1 IMPLANT
NDL SPNL 25GX3.5 QUINCKE BL (NEEDLE) IMPLANT
NEEDLE PRECISIONGLIDE 27X1.5 (NEEDLE) ×3 IMPLANT
NEEDLE SPNL 25GX3.5 QUINCKE BL (NEEDLE) ×3 IMPLANT
NS IRRIG 1000ML POUR BTL (IV SOLUTION) ×3 IMPLANT
PACK BASIN DAY SURGERY FS (CUSTOM PROCEDURE TRAY) ×3 IMPLANT
PACK ENT DAY SURGERY (CUSTOM PROCEDURE TRAY) ×3 IMPLANT
PAD CLEANER CAUTERY TIP 5X5 (MISCELLANEOUS)
PAD DRESSING TELFA 3X8 NADH (GAUZE/BANDAGES/DRESSINGS) IMPLANT
PATTIES SURGICAL .5 X3 (DISPOSABLE) ×3 IMPLANT
PENCIL SMOKE EVACUATOR (MISCELLANEOUS) IMPLANT
SHEET MEDIUM DRAPE 40X70 STRL (DRAPES) ×3 IMPLANT
SLEEVE SCD COMPRESS KNEE MED (MISCELLANEOUS) ×3 IMPLANT
SOL ANTI FOG 6CC (MISCELLANEOUS) ×1 IMPLANT
SOLUTION ANTI FOG 6CC (MISCELLANEOUS) ×2
SPONGE GAUZE 2X2 8PLY STER LF (GAUZE/BANDAGES/DRESSINGS) ×1
SPONGE GAUZE 2X2 8PLY STRL LF (GAUZE/BANDAGES/DRESSINGS) ×2 IMPLANT
SUT CHROMIC 4 0 PS 2 18 (SUTURE) IMPLANT
SUT ETHILON 3 0 PS 1 (SUTURE) IMPLANT
SUT SILK 2 0 PERMA HAND 18 BK (SUTURE) IMPLANT
SYR 10ML LL (SYRINGE) ×2 IMPLANT
SYR 3ML 18GX1 1/2 (SYRINGE) IMPLANT
SYR CONTROL 10ML LL (SYRINGE) IMPLANT
SYSTEM RELIEVA LUMA ILLUM (SINUPLASTY) IMPLANT
TOWEL GREEN STERILE FF (TOWEL DISPOSABLE) ×6 IMPLANT
TRACKER ENT INSTRUMENT (MISCELLANEOUS) ×3 IMPLANT
TRACKER ENT PATIENT (MISCELLANEOUS) ×3 IMPLANT
TRAY DSU PREP LF (CUSTOM PROCEDURE TRAY) ×3 IMPLANT
TUBE CONNECTING 20'X1/4 (TUBING) ×1
TUBE CONNECTING 20X1/4 (TUBING) ×2 IMPLANT
TUBING STRAIGHTSHOT EPS 5PK (TUBING) ×3 IMPLANT
YANKAUER SUCT BULB TIP NO VENT (SUCTIONS) ×3 IMPLANT

## 2020-11-17 NOTE — Brief Op Note (Signed)
11/17/2020  9:53 AM  PATIENT:  Marie Ford  74 y.o. female  PRE-OPERATIVE DIAGNOSIS:  CHRONIC SPHENOID SINUSITIS, HEADACHE, TURBINATE HYPERTROPHY  POST-OPERATIVE DIAGNOSIS:  CHRONIC SPHENOID SINUSITIS, HEADACHE, TURBINATE   PROCEDURE:  Procedure(s): LEFT SIDED TURBINATE REDUCTION (Left) LEFT SIDED TOTAL ETHMOIDECTOMY (Left) LEFT SIDED SPHENOIDECTOMY WITH TISSUE REMOVAL (Left) SINUS ENDO WITH FUSION (N/A)  SURGEON:  Surgeon(s) and Role:    Drema Halon, MD - Primary  PHYSICIAN ASSISTANT:   ASSISTANTS: none   ANESTHESIA:   general  EBL:  25 mL   BLOOD ADMINISTERED:none  DRAINS: none   LOCAL MEDICATIONS USED:  XYLOCAINE   SPECIMEN:  Source of Specimen:  left sphenoid tissue  DISPOSITION OF SPECIMEN:  PATHOLOGY  COUNTS:  YES  TOURNIQUET:  * No tourniquets in log *  DICTATION: .Other Dictation: Dictation Number [id number]  PLAN OF CARE: Discharge to home after PACU  PATIENT DISPOSITION:  PACU - hemodynamically stable.   Delay start of Pharmacological VTE agent (>24hrs) due to surgical blood loss or risk of bleeding: yes

## 2020-11-17 NOTE — Anesthesia Procedure Notes (Signed)
Performed by: Jayvion Stefanski, Viana D, CRNA       

## 2020-11-17 NOTE — Anesthesia Procedure Notes (Signed)

## 2020-11-17 NOTE — Anesthesia Preprocedure Evaluation (Addendum)
Anesthesia Evaluation  Patient identified by MRN, date of birth, ID band Patient awake    Reviewed: Allergy & Precautions, H&P , NPO status , Patient's Chart, lab work & pertinent test results  Airway Mallampati: II  TM Distance: >3 FB Neck ROM: Full    Dental no notable dental hx. (+) Teeth Intact, Dental Advisory Given   Pulmonary neg pulmonary ROS,    Pulmonary exam normal breath sounds clear to auscultation       Cardiovascular hypertension, Pt. on medications  Rhythm:Regular Rate:Normal     Neuro/Psych Depression negative neurological ROS     GI/Hepatic negative GI ROS, Neg liver ROS,   Endo/Other  negative endocrine ROS  Renal/GU Renal InsufficiencyRenal disease  negative genitourinary   Musculoskeletal  (+) Arthritis , Osteoarthritis,    Abdominal   Peds  Hematology negative hematology ROS (+)   Anesthesia Other Findings   Reproductive/Obstetrics negative OB ROS                            Anesthesia Physical Anesthesia Plan  ASA: II  Anesthesia Plan: General   Post-op Pain Management:    Induction: Intravenous  PONV Risk Score and Plan: 4 or greater and Ondansetron, Dexamethasone and Diphenhydramine  Airway Management Planned: Oral ETT  Additional Equipment:   Intra-op Plan:   Post-operative Plan: Extubation in OR  Informed Consent: I have reviewed the patients History and Physical, chart, labs and discussed the procedure including the risks, benefits and alternatives for the proposed anesthesia with the patient or authorized representative who has indicated his/her understanding and acceptance.     Dental advisory given  Plan Discussed with: CRNA  Anesthesia Plan Comments:         Anesthesia Quick Evaluation

## 2020-11-17 NOTE — Anesthesia Postprocedure Evaluation (Signed)
Anesthesia Post Note  Patient: Marie Ford  Procedure(s) Performed: LEFT SIDED TURBINATE REDUCTION (Left Nose) LEFT SIDED TOTAL ETHMOIDECTOMY (Left Nose) LEFT SIDED SPHENOIDECTOMY WITH TISSUE REMOVAL (Left Nose) SINUS ENDOSCOPY WITH FUSION NAVIGATION (Left Nose)     Patient location during evaluation: PACU Anesthesia Type: General Level of consciousness: awake and alert Pain management: pain level controlled Vital Signs Assessment: post-procedure vital signs reviewed and stable Respiratory status: spontaneous breathing, nonlabored ventilation and respiratory function stable Cardiovascular status: blood pressure returned to baseline and stable Postop Assessment: no apparent nausea or vomiting Anesthetic complications: no   No complications documented.  Last Vitals:  Vitals:   11/17/20 1100 11/17/20 1117  BP: (!) 142/71 (!) 148/66  Pulse: 89 92  Resp: 15 17  Temp:  36.7 C  SpO2: 95% 95%    Last Pain:  Vitals:   11/17/20 1117  TempSrc: Oral  PainSc: 0-No pain                 Damaris Geers,W. EDMOND

## 2020-11-17 NOTE — Interval H&P Note (Signed)
History and Physical Interval Note:  11/17/2020 7:41 AM  Marie Ford  has presented today for surgery, with the diagnosis of CHRONIC SPHENOID SINUSITIS, HEADACHE, TURBINATE HYPERTROPHY.  The various methods of treatment have been discussed with the patient and family. After consideration of risks, benefits and other options for treatment, the patient has consented to  Procedure(s): LEFT SIDED TURBINATE REDUCTION (Left) LEFT SIDED TOTAL ETHMOIDECTOMY (Left) LEFT SIDED SPHENOIDECTOMY WITH TISSUE REMOVAL (Left) SINUS ENDO WITH FUSION (N/A) as a surgical intervention.  The patient's history has been reviewed, patient examined, no change in status, stable for surgery.  I have reviewed the patient's chart and labs.  Questions were answered to the patient's satisfaction.     Dillard Cannon

## 2020-11-17 NOTE — Transfer of Care (Signed)
Immediate Anesthesia Transfer of Care Note  Patient: Marie Ford  Procedure(s) Performed: LEFT SIDED TURBINATE REDUCTION (Left Nose) LEFT SIDED TOTAL ETHMOIDECTOMY (Left Nose) LEFT SIDED SPHENOIDECTOMY WITH TISSUE REMOVAL (Left Nose) SINUS ENDOSCOPY WITH FUSION NAVIGATION (N/A Nose)  Patient Location: PACU  Anesthesia Type:General  Level of Consciousness: awake, alert , oriented, drowsy and patient cooperative  Airway & Oxygen Therapy: Patient Spontanous Breathing and Patient connected to face mask oxygen  Post-op Assessment: Report given to RN and Post -op Vital signs reviewed and stable  Post vital signs: Reviewed and stable  Last Vitals:  Vitals Value Taken Time  BP    Temp    Pulse 92 11/17/20 1005  Resp    SpO2 99 % 11/17/20 1005  Vitals shown include unvalidated device data.  Last Pain:  Vitals:   11/17/20 0653  TempSrc: Oral  PainSc: 4          Complications: No complications documented.

## 2020-11-17 NOTE — Discharge Instructions (Addendum)
Can blow nose gently Apply cold compress to nose if you have any bleeding Tylenol or ibuprofen prn pain Keflex 500 mg bid for the next 10 days Return to Dr Allene Pyo office next Tuesday at 4:30 Call office if you have any problems    [phone]   Post Anesthesia Home Care Instructions  Activity: Get plenty of rest for the remainder of the day. A responsible individual must stay with you for 24 hours following the procedure.  For the next 24 hours, DO NOT: -Drive a car -Advertising copywriter -Drink alcoholic beverages -Take any medication unless instructed by your physician -Make any legal decisions or sign important papers.  Meals: Start with liquid foods such as gelatin or soup. Progress to regular foods as tolerated. Avoid greasy, spicy, heavy foods. If nausea and/or vomiting occur, drink only clear liquids until the nausea and/or vomiting subsides. Call your physician if vomiting continues.  Special Instructions/Symptoms: Your throat may feel dry or sore from the anesthesia or the breathing tube placed in your throat during surgery. If this causes discomfort, gargle with warm salt water. The discomfort should disappear within 24 hours.  If you had a scopolamine patch placed behind your ear for the management of post- operative nausea and/or vomiting:  1. The medication in the patch is effective for 72 hours, after which it should be removed.  Wrap patch in a tissue and discard in the trash. Wash hands thoroughly with soap and water. 2. You may remove the patch earlier than 72 hours if you experience unpleasant side effects which may include dry mouth, dizziness or visual disturbances. 3. Avoid touching the patch. Wash your hands with soap and water after contact with the patch.    NO TYLENOL BEFORE 1:30pm, IF NEEDED.

## 2020-11-17 NOTE — Op Note (Signed)
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  OPERATION PERFORMED:  Left ethmoidectomy, left sphenoidotomy, reduction of left middle turbinate concha bullosa.  SURGEON:  Dillard Cannon, MD  ANESTHESIA:  General endotracheal.  BLOOD LOSS:  40 mL.  COMPLICATIONS:  None.  BRIEF CLINICAL NOTE:  The patient is a 74 year old female who has had chronic headaches and retroorbital pain for over a year.  She has had a couple of CT scans as well as MRI scan that shows chronic left sphenoid sinus disease and obstruction.  She has  very thickened sclerotic bone throughout the obstructed area.  She has been treated with several rounds of antibiotics as well as nasal steroid sprays, with persistent problems and is taken to the operating room at this time for left sphenoidotomy.  DESCRIPTION OF PROCEDURE:  After adequate endotracheal anesthesia, fusion guidance system was applied and calibrated.  The patient's nose was prepped with Betadine solution and draped out in sterile towels.  The nose was then further prepped with cotton  pledgets soaked in Afrin and the middle turbinate posterior ethmoid area and space of the sphenoid sinus was injected with 10-12 mL of Xylocaine with epinephrine.  The patient had a large fat middle turbinate consistent with concha bullosa.  In order to  obtain adequate exposure, the lower one half of the middle turbinate was amputated.  Next, the sphenoid turbinate was cauterized superiorly.  The region of the natural ostium of the sphenoid sinus had no obvious purulent discharge.  It was edematous.   However, the obstruction appeared to be  more inferior medial.  After cauterizing and controlling hemostasis with the cautery, first using the 40-degree cutting bur, the face of the sphenoid area inferomedially was drilled out.  Using the fusion system,  the area of the sphenoid sinus that appeared obstructed was navigated and then, finally using a diamond bur, the area of the sphenoid sinus that appeared obstructed was entered with the diamond bur.  Using some curettes, the edges of the opening were  enlarged slightly with curettes.  Some tissue from the obstructed sphenoid area was removed and sent for pathology.  We basically opened up just the medial smaller opening of the sphenoid sinus.  There was a larger pocket more posteriorly deep to the  sella, which was not grossly entered but appeared to be connected with the opened sphenoid.  The opening of this was about the size of the diamond bur or about 5-6 mm.  We had to drill through about 5-6 mm of bone in order to get to the opening.   Hemostasis was obtained.  NasoPore soaked in mupirocin was placed, one piece within the opening of the sphenoid and  then another piece placed posteriorly in the posterior ethmoid area.  There was not that much bleeding and no evidence of CSF leak.  This  completed the procedure.  The patient was subsequently awoken from anesthesia and transferred to recovery room postoperatively doing well.  No packing was placed within the nasal cavity, otherwise.  DISPOSITION:  The patient was discharged home on Keflex 500 mg b.i.d. for 10 days.  She will follow up in my office in 5-6 days for recheck.  HN/NUANCE  D:11/17/2020 T:11/17/2020 JOB:[id number]

## 2020-11-18 ENCOUNTER — Encounter (HOSPITAL_BASED_OUTPATIENT_CLINIC_OR_DEPARTMENT_OTHER): Payer: Self-pay | Admitting: Otolaryngology

## 2020-11-18 LAB — SURGICAL PATHOLOGY

## 2020-11-22 ENCOUNTER — Other Ambulatory Visit: Payer: Self-pay

## 2020-11-22 ENCOUNTER — Ambulatory Visit (INDEPENDENT_AMBULATORY_CARE_PROVIDER_SITE_OTHER): Payer: Medicare PPO | Admitting: Otolaryngology

## 2020-11-22 ENCOUNTER — Encounter (INDEPENDENT_AMBULATORY_CARE_PROVIDER_SITE_OTHER): Payer: Self-pay | Admitting: Otolaryngology

## 2020-11-22 VITALS — Temp 97.7°F

## 2020-11-22 DIAGNOSIS — Z4889 Encounter for other specified surgical aftercare: Secondary | ICD-10-CM

## 2020-11-22 NOTE — Progress Notes (Signed)
HPI: Marie Ford is a 74 y.o. female who presents 5 days s/p left sphenoidotomy and posterior ethmoidectomy.  Her headaches are doing much better.  She still has some crusting and mucus draining from her nose but no evidence of CSF.  She has had no drainage today..   Past Medical History:  Diagnosis Date  . Chronic kidney disease    stage 3   . Chronic sphenoidal sinusitis   . Degenerative arthritis of hip    requirde surgery 2009  . Depression 2010  . Diplopia 2011   in left lateral gaze   . Diplopia    " my eyes dont track together, i have corrective prisms in my eye glasses"   . Diverticulosis 2006   noted in a colonoscopy  . Esophageal reflux 2013  . Hypertension   . IGT (impaired glucose tolerance) 2011   denies hx of diabetes   . Insomnia 2009  . Osteoarthritis   . Osteopenia 2005  . Other and unspecified hyperlipidemia 2000  . Other specified disease of sebaceous glands   . Palpitations 2009   secondary to PSVT  . PNA (pneumonia) 2014  . Psoriasis 2004  . PSVT (paroxysmal supraventricular tachycardia) (HCC) 2009   "report it has been some time since i had that "   . Situational stress   . Venous insufficiency since 2017   Past Surgical History:  Procedure Laterality Date  . CATARACT EXTRACTION W/ INTRAOCULAR LENS  IMPLANT, BILATERAL    . COLONOSCOPY  01/30/2010   Jarold Motto  . ETHMOIDECTOMY Left 11/17/2020   Procedure: LEFT SIDED TOTAL ETHMOIDECTOMY;  Surgeon: Drema Halon, MD;  Location: [facility];  Service: ENT;  Laterality: Left;  . EYE SURGERY    . HEMORRHOID SURGERY    . JOINT REPLACEMENT    . LASIK    . left medial and superior rectus muscle recesion for diplopia correction    . SINUS ENDO WITH FUSION Left 11/17/2020   Procedure: SINUS ENDOSCOPY WITH FUSION NAVIGATION;  Surgeon: Drema Halon, MD;  Location: [facility];  Service: ENT;  Laterality: Left;  . SPHENOIDECTOMY Left 11/17/2020   Procedure: LEFT  SIDED SPHENOIDECTOMY WITH TISSUE REMOVAL;  Surgeon: Drema Halon, MD;  Location: [facility];  Service: ENT;  Laterality: Left;  . surgery of right eye rectus muscles for diplpia correction   2019  . TOTAL HIP ARTHROPLASTY Right 2010  . TOTAL HIP ARTHROPLASTY Left 08/19/2019   Procedure: TOTAL HIP ARTHROPLASTY ANTERIOR APPROACH;  Surgeon: Ollen Gross, MD;  Location: [facility];  Service: Orthopedics;  Laterality: Left;   . TURBINATE REDUCTION Left 11/17/2020   Procedure: LEFT SIDED TURBINATE REDUCTION;  Surgeon: Drema Halon, MD;  Location: [facility];  Service: ENT;  Laterality: Left;  . URETHRAL SLING     midurethral sling with TVT Exact and Cystoscopy   Social History   Socioeconomic History  . Marital status: Single    Spouse name: Not on file  . Number of children: Not on file  . Years of education: Not on file  . Highest education level: Not on file  Occupational History  . Not on file  Tobacco Use  . Smoking status: Never Smoker  . Smokeless tobacco: Never Used  Vaping Use  . Vaping Use: Never used  Substance and Sexual Activity  . Alcohol use: Yes    Comment: occ wine  . Drug use: No  . Sexual activity: Not  Currently    Birth control/protection: Post-menopausal  Other Topics Concern  . Not on file  Social History Narrative  . Not on file   Social Determinants of Health   Financial Resource Strain: Not on file  Food Insecurity: Not on file  Transportation Needs: Not on file  Physical Activity: Not on file  Stress: Not on file  Social Connections: Not on file   Family History  Problem Relation Age of Onset  . Congestive Heart Failure Mother   . CVA Father   . Heart failure Father   . Liver cancer Brother   . Colon cancer Brother 61  . Colitis Brother   . Non-Hodgkin's lymphoma Sister   . Hypertension Brother   . Gout Brother   . Alcohol abuse Sister   . Melanoma Daughter   . Esophageal cancer Neg Hx   .  Rectal cancer Neg Hx   . Stomach cancer Neg Hx   . Colon polyps Neg Hx    Allergies  Allergen Reactions  . Erythromycin Shortness Of Breath and Rash  . Macrobid [Nitrofurantoin] Other (See Comments)    Pass out  . Penicillins Rash    Did it involve swelling of the face/tongue/throat, SOB, or low BP? No Did it involve sudden or severe rash/hives, skin peeling, or any reaction on the inside of your mouth or nose? No Did you need to seek medical attention at a hospital or doctor's office? No When did it last happen? More than 10 years ago If all above answers are "NO", may proceed with cephalosporin use.   . Sulfamethoxazole-Trimethoprim Rash   Prior to Admission medications   Medication Sig Start Date End Date Taking? Authorizing Provider  Calcium Carb-Cholecalciferol (CALCIUM CARBONATE-VITAMIN D3) 600-400 MG-UNIT TABS Take 1 tablet by mouth every other day.     [provider]  cephALEXin (KEFLEX) 500 MG capsule Take 1 capsule (500 mg total) by mouth 2 (two) times daily. 11/17/20   Drema Halon, MD  conjugated estrogens (PREMARIN) vaginal cream Place 1 Applicatorful vaginally as needed.    [provider]  ezetimibe (ZETIA) 10 MG tablet ezetimibe 10 mg tablet  Take 1 tablet every day by oral route. 02/25/19   [provider]  FLUoxetine (PROZAC) 20 MG capsule  10/10/20   [provider]  Multiple Vitamin (MULTIVITAMIN WITH MINERALS) TABS tablet Take 1 tablet by mouth daily. Women's Multivitamin    [provider]  omeprazole (PRILOSEC) 20 MG capsule Take 20 mg by mouth daily as needed (with meloxicam use).    [provider]  Polyethyl Glycol-Propyl Glycol (SYSTANE) 0.4-0.3 % SOLN Place 1 drop into both eyes 3 (three) times daily as needed (dry/irritated eyes.).    [provider]  pravastatin (PRAVACHOL) 80 MG tablet Take 80 mg by mouth every evening.     [provider]  triamcinolone (NASACORT) 55 MCG/ACT  AERO nasal inhaler Place 2 sprays into the nose daily. Use at night 11/01/20   Drema Halon, MD  triamterene-hydrochlorothiazide (MAXZIDE-25) 37.5-25 MG tablet Take 1 tablet by mouth daily.    [provider]     Physical Exam: On nasal endoscopy she has scabbing and crusting still in place.  I am on able to visualize the sphenoidotomy because of the scabbing and crusting.   Assessment: S/p left sphenoidotomy and posterior ethmoidectomy.  Plan: She will follow-up in 1 week for recheck. Recommended use of saline irrigations on a regular basis to help reduce the scabbing and  crusting.   [name], MD

## 2020-12-01 ENCOUNTER — Encounter (INDEPENDENT_AMBULATORY_CARE_PROVIDER_SITE_OTHER): Payer: Self-pay | Admitting: Otolaryngology

## 2020-12-01 ENCOUNTER — Ambulatory Visit (INDEPENDENT_AMBULATORY_CARE_PROVIDER_SITE_OTHER): Payer: Medicare PPO | Admitting: Otolaryngology

## 2020-12-01 ENCOUNTER — Other Ambulatory Visit: Payer: Self-pay

## 2020-12-01 VITALS — Temp 97.2°F

## 2020-12-01 DIAGNOSIS — Z4889 Encounter for other specified surgical aftercare: Secondary | ICD-10-CM

## 2020-12-01 NOTE — Progress Notes (Signed)
HPI: Marie Ford is a 74 y.o. female who presents a little over 3 weeks  s/p left sphenoidotomy.  Her headaches are continuing to get better.  She is breathing better through the left side of her nose.Marland Kitchen   Past Medical History:  Diagnosis Date  . Chronic kidney disease    stage 3   . Chronic sphenoidal sinusitis   . Degenerative arthritis of hip    requirde surgery 2009  . Depression 2010  . Diplopia 2011   in left lateral gaze   . Diplopia    " my eyes dont track together, i have corrective prisms in my eye glasses"   . Diverticulosis 2006   noted in a colonoscopy  . Esophageal reflux 2013  . Hypertension   . IGT (impaired glucose tolerance) 2011   denies hx of diabetes   . Insomnia 2009  . Osteoarthritis   . Osteopenia 2005  . Other and unspecified hyperlipidemia 2000  . Other specified disease of sebaceous glands   . Palpitations 2009   secondary to PSVT  . PNA (pneumonia) 2014  . Psoriasis 2004  . PSVT (paroxysmal supraventricular tachycardia) (HCC) 2009   "report it has been some time since i had that "   . Situational stress   . Venous insufficiency since 2017   Past Surgical History:  Procedure Laterality Date  . CATARACT EXTRACTION W/ INTRAOCULAR LENS  IMPLANT, BILATERAL    . COLONOSCOPY  01/30/2010   Jarold Motto  . ETHMOIDECTOMY Left 11/17/2020   Procedure: LEFT SIDED TOTAL ETHMOIDECTOMY;  Surgeon: Drema Halon, MD;  Location: [facility];  Service: ENT;  Laterality: Left;  . EYE SURGERY    . HEMORRHOID SURGERY    . JOINT REPLACEMENT    . LASIK    . left medial and superior rectus muscle recesion for diplopia correction    . SINUS ENDO WITH FUSION Left 11/17/2020   Procedure: SINUS ENDOSCOPY WITH FUSION NAVIGATION;  Surgeon: Drema Halon, MD;  Location: [facility];  Service: ENT;  Laterality: Left;  . SPHENOIDECTOMY Left 11/17/2020   Procedure: LEFT SIDED SPHENOIDECTOMY WITH TISSUE REMOVAL;  Surgeon: Drema Halon, MD;  Location: [facility];  Service: ENT;  Laterality: Left;  . surgery of right eye rectus muscles for diplpia correction   2019  . TOTAL HIP ARTHROPLASTY Right 2010  . TOTAL HIP ARTHROPLASTY Left 08/19/2019   Procedure: TOTAL HIP ARTHROPLASTY ANTERIOR APPROACH;  Surgeon: Ollen Gross, MD;  Location: [facility];  Service: Orthopedics;  Laterality: Left;   . TURBINATE REDUCTION Left 11/17/2020   Procedure: LEFT SIDED TURBINATE REDUCTION;  Surgeon: Drema Halon, MD;  Location: [facility];  Service: ENT;  Laterality: Left;  . URETHRAL SLING     midurethral sling with TVT Exact and Cystoscopy   Social History   Socioeconomic History  . Marital status: Single    Spouse name: Not on file  . Number of children: Not on file  . Years of education: Not on file  . Highest education level: Not on file  Occupational History  . Not on file  Tobacco Use  . Smoking status: Never Smoker  . Smokeless tobacco: Never Used  Vaping Use  . Vaping Use: Never used  Substance and Sexual Activity  . Alcohol use: Yes    Comment: occ wine  . Drug use: No  . Sexual activity: Not Currently    Birth control/protection: Post-menopausal  Other Topics  Concern  . Not on file  Social History Narrative  . Not on file   Social Determinants of Health   Financial Resource Strain: Not on file  Food Insecurity: Not on file  Transportation Needs: Not on file  Physical Activity: Not on file  Stress: Not on file  Social Connections: Not on file   Family History  Problem Relation Age of Onset  . Congestive Heart Failure Mother   . CVA Father   . Heart failure Father   . Liver cancer Brother   . Colon cancer Brother 32  . Colitis Brother   . Non-Hodgkin's lymphoma Sister   . Hypertension Brother   . Gout Brother   . Alcohol abuse Sister   . Melanoma Daughter   . Esophageal cancer Neg Hx   . Rectal cancer Neg Hx   . Stomach cancer Neg Hx   . Colon  polyps Neg Hx    Allergies  Allergen Reactions  . Erythromycin Shortness Of Breath and Rash  . Macrobid [Nitrofurantoin] Other (See Comments)    Pass out  . Penicillins Rash    Did it involve swelling of the face/tongue/throat, SOB, or low BP? No Did it involve sudden or severe rash/hives, skin peeling, or any reaction on the inside of your mouth or nose? No Did you need to seek medical attention at a hospital or doctor's office? No When did it last happen? More than 10 years ago If all above answers are "NO", may proceed with cephalosporin use.   . Sulfamethoxazole-Trimethoprim Rash   Prior to Admission medications   Medication Sig Start Date End Date Taking? Authorizing Provider  Calcium Carb-Cholecalciferol (CALCIUM CARBONATE-VITAMIN D3) 600-400 MG-UNIT TABS Take 1 tablet by mouth every other day.     [provider]  cephALEXin (KEFLEX) 500 MG capsule Take 1 capsule (500 mg total) by mouth 2 (two) times daily. 11/17/20   Drema Halon, MD  conjugated estrogens (PREMARIN) vaginal cream Place 1 Applicatorful vaginally as needed.    [provider]  ezetimibe (ZETIA) 10 MG tablet ezetimibe 10 mg tablet  Take 1 tablet every day by oral route. 02/25/19   [provider]  FLUoxetine (PROZAC) 20 MG capsule  10/10/20   [provider]  Multiple Vitamin (MULTIVITAMIN WITH MINERALS) TABS tablet Take 1 tablet by mouth daily. Women's Multivitamin    [provider]  omeprazole (PRILOSEC) 20 MG capsule Take 20 mg by mouth daily as needed (with meloxicam use).    [provider]  Polyethyl Glycol-Propyl Glycol (SYSTANE) 0.4-0.3 % SOLN Place 1 drop into both eyes 3 (three) times daily as needed (dry/irritated eyes.).    [provider]  pravastatin (PRAVACHOL) 80 MG tablet Take 80 mg by mouth every evening.     [provider]  triamcinolone (NASACORT) 55 MCG/ACT AERO nasal inhaler Place 2 sprays into the nose daily. Use at  night 11/01/20   Drema Halon, MD  triamterene-hydrochlorothiazide (MAXZIDE-25) 37.5-25 MG tablet Take 1 tablet by mouth daily.    [provider]     Physical Exam: She still has some scabbing and crusting partially obstructing the sphenoidotomy on endoscopic exam.  But there is no mucopurulent discharge noted in the nasal passageways otherwise clear.   Assessment: S/p she will continue with the saline irrigations and follow-up in 2 weeks for recheck and cleaning.  Plan: She will follow-up in 2 weeks for recheck and cleaning.   [name], MD

## 2020-12-15 ENCOUNTER — Ambulatory Visit (INDEPENDENT_AMBULATORY_CARE_PROVIDER_SITE_OTHER): Payer: Medicare PPO | Admitting: Otolaryngology

## 2020-12-15 ENCOUNTER — Other Ambulatory Visit: Payer: Self-pay

## 2020-12-15 VITALS — Temp 97.0°F

## 2020-12-15 DIAGNOSIS — Z4889 Encounter for other specified surgical aftercare: Secondary | ICD-10-CM

## 2020-12-15 NOTE — Progress Notes (Signed)
HPI: Marie Ford is a 75 y.o. female who presents 4 weeks days s/p left sphenoidotomy.  She is doing well has been using saline rinses once a day..   Past Medical History:  Diagnosis Date  . Chronic kidney disease    stage 3   . Chronic sphenoidal sinusitis   . Degenerative arthritis of hip    requirde surgery 2009  . Depression 2010  . Diplopia 2011   in left lateral gaze   . Diplopia    " my eyes dont track together, i have corrective prisms in my eye glasses"   . Diverticulosis 2006   noted in a colonoscopy  . Esophageal reflux 2013  . Hypertension   . IGT (impaired glucose tolerance) 2011   denies hx of diabetes   . Insomnia 2009  . Osteoarthritis   . Osteopenia 2005  . Other and unspecified hyperlipidemia 2000  . Other specified disease of sebaceous glands   . Palpitations 2009   secondary to PSVT  . PNA (pneumonia) 2014  . Psoriasis 2004  . PSVT (paroxysmal supraventricular tachycardia) (HCC) 2009   "report it has been some time since i had that "   . Situational stress   . Venous insufficiency since 2017   Past Surgical History:  Procedure Laterality Date  . CATARACT EXTRACTION W/ INTRAOCULAR LENS  IMPLANT, BILATERAL    . COLONOSCOPY  01/30/2010   Jarold Motto  . ETHMOIDECTOMY Left 11/17/2020   Procedure: LEFT SIDED TOTAL ETHMOIDECTOMY;  Surgeon: Drema Halon, MD;  Location: [facility];  Service: ENT;  Laterality: Left;  . EYE SURGERY    . HEMORRHOID SURGERY    . JOINT REPLACEMENT    . LASIK    . left medial and superior rectus muscle recesion for diplopia correction    . SINUS ENDO WITH FUSION Left 11/17/2020   Procedure: SINUS ENDOSCOPY WITH FUSION NAVIGATION;  Surgeon: Drema Halon, MD;  Location: [facility];  Service: ENT;  Laterality: Left;  . SPHENOIDECTOMY Left 11/17/2020   Procedure: LEFT SIDED SPHENOIDECTOMY WITH TISSUE REMOVAL;  Surgeon: Drema Halon, MD;  Location: [facility];   Service: ENT;  Laterality: Left;  . surgery of right eye rectus muscles for diplpia correction   2019  . TOTAL HIP ARTHROPLASTY Right 2010  . TOTAL HIP ARTHROPLASTY Left 08/19/2019   Procedure: TOTAL HIP ARTHROPLASTY ANTERIOR APPROACH;  Surgeon: Ollen Gross, MD;  Location: [facility];  Service: Orthopedics;  Laterality: Left;   . TURBINATE REDUCTION Left 11/17/2020   Procedure: LEFT SIDED TURBINATE REDUCTION;  Surgeon: Drema Halon, MD;  Location: [facility];  Service: ENT;  Laterality: Left;  . URETHRAL SLING     midurethral sling with TVT Exact and Cystoscopy   Social History   Socioeconomic History  . Marital status: Single    Spouse name: Not on file  . Number of children: Not on file  . Years of education: Not on file  . Highest education level: Not on file  Occupational History  . Not on file  Tobacco Use  . Smoking status: Never Smoker  . Smokeless tobacco: Never Used  Vaping Use  . Vaping Use: Never used  Substance and Sexual Activity  . Alcohol use: Yes    Comment: occ wine  . Drug use: No  . Sexual activity: Not Currently    Birth control/protection: Post-menopausal  Other Topics Concern  . Not on file  Social History Narrative  .  Not on file   Social Determinants of Health   Financial Resource Strain: Not on file  Food Insecurity: Not on file  Transportation Needs: Not on file  Physical Activity: Not on file  Stress: Not on file  Social Connections: Not on file   Family History  Problem Relation Age of Onset  . Congestive Heart Failure Mother   . CVA Father   . Heart failure Father   . Liver cancer Brother   . Colon cancer Brother 42  . Colitis Brother   . Non-Hodgkin's lymphoma Sister   . Hypertension Brother   . Gout Brother   . Alcohol abuse Sister   . Melanoma Daughter   . Esophageal cancer Neg Hx   . Rectal cancer Neg Hx   . Stomach cancer Neg Hx   . Colon polyps Neg Hx    Allergies  Allergen Reactions  .  Erythromycin Shortness Of Breath and Rash  . Macrobid [Nitrofurantoin] Other (See Comments)    Pass out  . Penicillins Rash    Did it involve swelling of the face/tongue/throat, SOB, or low BP? No Did it involve sudden or severe rash/hives, skin peeling, or any reaction on the inside of your mouth or nose? No Did you need to seek medical attention at a hospital or doctor's office? No When did it last happen? More than 10 years ago If all above answers are "NO", may proceed with cephalosporin use.   . Sulfamethoxazole-Trimethoprim Rash   Prior to Admission medications   Medication Sig Start Date End Date Taking? Authorizing Provider  Calcium Carb-Cholecalciferol (CALCIUM CARBONATE-VITAMIN D3) 600-400 MG-UNIT TABS Take 1 tablet by mouth every other day.     [provider]  cephALEXin (KEFLEX) 500 MG capsule Take 1 capsule (500 mg total) by mouth 2 (two) times daily. 11/17/20   Drema Halon, MD  conjugated estrogens (PREMARIN) vaginal cream Place 1 Applicatorful vaginally as needed.    [provider]  ezetimibe (ZETIA) 10 MG tablet ezetimibe 10 mg tablet  Take 1 tablet every day by oral route. 02/25/19   [provider]  FLUoxetine (PROZAC) 20 MG capsule  10/10/20   [provider]  Multiple Vitamin (MULTIVITAMIN WITH MINERALS) TABS tablet Take 1 tablet by mouth daily. Women's Multivitamin    [provider]  omeprazole (PRILOSEC) 20 MG capsule Take 20 mg by mouth daily as needed (with meloxicam use).    [provider]  Polyethyl Glycol-Propyl Glycol (SYSTANE) 0.4-0.3 % SOLN Place 1 drop into both eyes 3 (three) times daily as needed (dry/irritated eyes.).    [provider]  pravastatin (PRAVACHOL) 80 MG tablet Take 80 mg by mouth every evening.     [provider]  triamcinolone (NASACORT) 55 MCG/ACT AERO nasal inhaler Place 2 sprays into the nose daily. Use at night 11/01/20   Drema Halon, MD   triamterene-hydrochlorothiazide (MAXZIDE-25) 37.5-25 MG tablet Take 1 tablet by mouth daily.    [provider]     Physical Exam: She has large amount of crusting around the ostia which was partially removed in the office today.  On endoscopic exam the ostia appears patent.  No gross mucopurulent discharge noted.  She still has mild granulation tissue and is mild crusting.   Assessment: S/p left sphenoidotomy performed on 11/17/2020  Plan: Recommend continue use of saline irrigations daily and will have her follow-up in 2 weeks for recheck.   [name], MD

## 2020-12-16 ENCOUNTER — Telehealth (INDEPENDENT_AMBULATORY_CARE_PROVIDER_SITE_OTHER): Payer: Self-pay

## 2020-12-23 NOTE — Telephone Encounter (Signed)
Patient had called yesterday about having "sinus headaches" again.  She has been using saline irrigation. Discussed with her concerning stopping the saline irrigations and going back on the Nasacort spray 2 sprays each nostril at night. She will follow-up next Monday in the office for recheck.

## 2020-12-26 ENCOUNTER — Ambulatory Visit (INDEPENDENT_AMBULATORY_CARE_PROVIDER_SITE_OTHER): Payer: Medicare PPO | Admitting: Otolaryngology

## 2021-01-02 ENCOUNTER — Ambulatory Visit (INDEPENDENT_AMBULATORY_CARE_PROVIDER_SITE_OTHER): Payer: Medicare PPO | Admitting: Otolaryngology

## 2021-01-02 ENCOUNTER — Other Ambulatory Visit: Payer: Self-pay

## 2021-01-02 VITALS — Temp 97.3°F

## 2021-01-02 DIAGNOSIS — Z4889 Encounter for other specified surgical aftercare: Secondary | ICD-10-CM

## 2021-01-02 NOTE — Progress Notes (Signed)
HPI: Marie Ford is a 75 y.o. female who presents 45 days s/p left sphenoidotomy.  She has done well up until the past week as she is starting to develop some more headache and pain behind the left eye.Marland Kitchen   Past Medical History:  Diagnosis Date  . Chronic kidney disease    stage 3   . Chronic sphenoidal sinusitis   . Degenerative arthritis of hip    requirde surgery 2009  . Depression 2010  . Diplopia 2011   in left lateral gaze   . Diplopia    " my eyes dont track together, i have corrective prisms in my eye glasses"   . Diverticulosis 2006   noted in a colonoscopy  . Esophageal reflux 2013  . Hypertension   . IGT (impaired glucose tolerance) 2011   denies hx of diabetes   . Insomnia 2009  . Osteoarthritis   . Osteopenia 2005  . Other and unspecified hyperlipidemia 2000  . Other specified disease of sebaceous glands   . Palpitations 2009   secondary to PSVT  . PNA (pneumonia) 2014  . Psoriasis 2004  . PSVT (paroxysmal supraventricular tachycardia) (HCC) 2009   "report it has been some time since i had that "   . Situational stress   . Venous insufficiency since 2017   Past Surgical History:  Procedure Laterality Date  . CATARACT EXTRACTION W/ INTRAOCULAR LENS  IMPLANT, BILATERAL    . COLONOSCOPY  01/30/2010   Jarold Motto  . ETHMOIDECTOMY Left 11/17/2020   Procedure: LEFT SIDED TOTAL ETHMOIDECTOMY;  Surgeon: Drema Halon, MD;  Location: [facility];  Service: ENT;  Laterality: Left;  . EYE SURGERY    . HEMORRHOID SURGERY    . JOINT REPLACEMENT    . LASIK    . left medial and superior rectus muscle recesion for diplopia correction    . SINUS ENDO WITH FUSION Left 11/17/2020   Procedure: SINUS ENDOSCOPY WITH FUSION NAVIGATION;  Surgeon: Drema Halon, MD;  Location: [facility];  Service: ENT;  Laterality: Left;  . SPHENOIDECTOMY Left 11/17/2020   Procedure: LEFT SIDED SPHENOIDECTOMY WITH TISSUE REMOVAL;  Surgeon: Drema Halon, MD;  Location: [facility];  Service: ENT;  Laterality: Left;  . surgery of right eye rectus muscles for diplpia correction   2019  . TOTAL HIP ARTHROPLASTY Right 2010  . TOTAL HIP ARTHROPLASTY Left 08/19/2019   Procedure: TOTAL HIP ARTHROPLASTY ANTERIOR APPROACH;  Surgeon: Ollen Gross, MD;  Location: [facility];  Service: Orthopedics;  Laterality: Left;   . TURBINATE REDUCTION Left 11/17/2020   Procedure: LEFT SIDED TURBINATE REDUCTION;  Surgeon: Drema Halon, MD;  Location: [facility];  Service: ENT;  Laterality: Left;  . URETHRAL SLING     midurethral sling with TVT Exact and Cystoscopy   Social History   Socioeconomic History  . Marital status: Single    Spouse name: Not on file  . Number of children: Not on file  . Years of education: Not on file  . Highest education level: Not on file  Occupational History  . Not on file  Tobacco Use  . Smoking status: Never Smoker  . Smokeless tobacco: Never Used  Vaping Use  . Vaping Use: Never used  Substance and Sexual Activity  . Alcohol use: Yes    Comment: occ wine  . Drug use: No  . Sexual activity: Not Currently    Birth control/protection: Post-menopausal  Other  Topics Concern  . Not on file  Social History Narrative  . Not on file   Social Determinants of Health   Financial Resource Strain: Not on file  Food Insecurity: Not on file  Transportation Needs: Not on file  Physical Activity: Not on file  Stress: Not on file  Social Connections: Not on file   Family History  Problem Relation Age of Onset  . Congestive Heart Failure Mother   . CVA Father   . Heart failure Father   . Liver cancer Brother   . Colon cancer Brother 22  . Colitis Brother   . Non-Hodgkin's lymphoma Sister   . Hypertension Brother   . Gout Brother   . Alcohol abuse Sister   . Melanoma Daughter   . Esophageal cancer Neg Hx   . Rectal cancer Neg Hx   . Stomach cancer Neg Hx   . Colon  polyps Neg Hx    Allergies  Allergen Reactions  . Erythromycin Shortness Of Breath and Rash  . Macrobid [Nitrofurantoin] Other (See Comments)    Pass out  . Penicillins Rash    Did it involve swelling of the face/tongue/throat, SOB, or low BP? No Did it involve sudden or severe rash/hives, skin peeling, or any reaction on the inside of your mouth or nose? No Did you need to seek medical attention at a hospital or doctor's office? No When did it last happen? More than 10 years ago If all above answers are "NO", may proceed with cephalosporin use.   . Sulfamethoxazole-Trimethoprim Rash   Prior to Admission medications   Medication Sig Start Date End Date Taking? Authorizing Provider  Calcium Carb-Cholecalciferol (CALCIUM CARBONATE-VITAMIN D3) 600-400 MG-UNIT TABS Take 1 tablet by mouth every other day.     [provider]  cephALEXin (KEFLEX) 500 MG capsule Take 1 capsule (500 mg total) by mouth 2 (two) times daily. 11/17/20   Drema Halon, MD  conjugated estrogens (PREMARIN) vaginal cream Place 1 Applicatorful vaginally as needed.    [provider]  ezetimibe (ZETIA) 10 MG tablet ezetimibe 10 mg tablet  Take 1 tablet every day by oral route. 02/25/19   [provider]  FLUoxetine (PROZAC) 20 MG capsule  10/10/20   [provider]  Multiple Vitamin (MULTIVITAMIN WITH MINERALS) TABS tablet Take 1 tablet by mouth daily. Women's Multivitamin    [provider]  omeprazole (PRILOSEC) 20 MG capsule Take 20 mg by mouth daily as needed (with meloxicam use).    [provider]  Polyethyl Glycol-Propyl Glycol (SYSTANE) 0.4-0.3 % SOLN Place 1 drop into both eyes 3 (three) times daily as needed (dry/irritated eyes.).    [provider]  pravastatin (PRAVACHOL) 80 MG tablet Take 80 mg by mouth every evening.     [provider]  triamcinolone (NASACORT) 55 MCG/ACT AERO nasal inhaler Place 2 sprays into the nose daily. Use at  night 11/01/20   Drema Halon, MD  triamterene-hydrochlorothiazide (MAXZIDE-25) 37.5-25 MG tablet Take 1 tablet by mouth daily.    [provider]     Physical Exam: She has moderate crusting around the sphenoidotomy which is mostly bony.  No obvious mucopurulent discharge noted.   Assessment: S/p left posterior ethmoidectomy and sphenoidotomy  Plan: Recommended resuming the saline irrigations and use of the Flonase at night. Also prescribed Keflex 500 mg twice daily for 2 weeks. She will follow up in 3 weeks for recheck.   [name], MD

## 2021-01-03 ENCOUNTER — Other Ambulatory Visit (INDEPENDENT_AMBULATORY_CARE_PROVIDER_SITE_OTHER): Payer: Self-pay

## 2021-01-03 MED ORDER — CEPHALEXIN 500 MG PO CAPS: 500.0000 mg | ORAL_CAPSULE | Freq: Two times a day (BID) | ORAL | 0 refills | Status: AC

## 2021-01-06 ENCOUNTER — Other Ambulatory Visit: Payer: Self-pay | Admitting: Family Medicine

## 2021-01-06 ENCOUNTER — Ambulatory Visit
Admission: RE | Admit: 2021-01-06 | Discharge: 2021-01-06 | Disposition: A | Discharge status: Home / Self Care | Payer: Medicare PPO | Source: Ambulatory Visit | Attending: Family Medicine | Admitting: Family Medicine

## 2021-01-06 ENCOUNTER — Other Ambulatory Visit: Payer: Self-pay

## 2021-01-06 DIAGNOSIS — R0602 Shortness of breath: Secondary | ICD-10-CM

## 2021-01-09 ENCOUNTER — Ambulatory Visit (INDEPENDENT_AMBULATORY_CARE_PROVIDER_SITE_OTHER): Payer: Medicare PPO | Admitting: Otolaryngology

## 2021-01-12 ENCOUNTER — Encounter (INDEPENDENT_AMBULATORY_CARE_PROVIDER_SITE_OTHER): Payer: Medicare PPO | Admitting: Otolaryngology

## 2021-01-16 ENCOUNTER — Ambulatory Visit: Payer: Medicare PPO | Admitting: Neurology

## 2021-01-16 ENCOUNTER — Encounter: Payer: Self-pay | Admitting: Neurology

## 2021-01-16 VITALS — BP 122/79 | HR 78 | Ht 66.5 in | Wt 163.0 lb

## 2021-01-16 DIAGNOSIS — R269 Unspecified abnormalities of gait and mobility: Secondary | ICD-10-CM

## 2021-01-16 DIAGNOSIS — R278 Other lack of coordination: Secondary | ICD-10-CM

## 2021-01-16 DIAGNOSIS — R270 Ataxia, unspecified: Secondary | ICD-10-CM

## 2021-01-16 DIAGNOSIS — R531 Weakness: Secondary | ICD-10-CM

## 2021-01-16 DIAGNOSIS — R251 Tremor, unspecified: Secondary | ICD-10-CM | POA: Diagnosis not present

## 2021-01-16 DIAGNOSIS — G20 Parkinson's disease: Secondary | ICD-10-CM

## 2021-01-16 NOTE — Patient Instructions (Addendum)
Blood work today Formal memory testing: You will be called MRI cervical spine DAT Scan (We will ask about the Prozac and get back to you)  Brain DaTscan How to prepare and what to expect ?????????????????????????????????????What is a brain DaTscan? A brain DaTscan is a nuclear medicine scan. It uses radioactive material to diagnose some diseases of the brain, especially those that cause tremor (shakiness). DaTscan is a brand name for a drug called ioflupane I-123. A brain DaTscan is a form of radiology, because radiation is used to take pictures of the body. This radioactive drug is ordered especially for you. Because of this, we need at least 72 hours' notice if you must cancel or reschedule your scan.   How does the scan work? You will be given a small dose of tracer (radioactive material) through an intravenous (IV) line. This tracer will collect in part of your brain and give off gamma rays. A special camera called a gamma camera will use these rays to produce pictures and measurements of your brain. How do I prepare? Some drugs will affect the results of your brain DaTscan. You will need to stop taking these drugs before your scan. The table on page 2 lists the drugs that need to be stopped, and for how many days before your scan. This list is in alphabetical order by the generic name of the drug. The common brand names are listed beneath the generic name. Please confirm these instructions with your doctor who prescribed the drug. Drugs to Stop Taking Before your scan, stop taking these medicines for the length of time shown: Name of Drug Stop Taking  Amoxapine 4 days before  Benztropine  Cogentin 3 days before  Bupropion (Aplenzin, Budeprion, Voxra, Wellbutrin, Zyban) 48 hours before  Buspirone 15 hours before  Citalopram 24 hours before  Cocaine 6 hours before  Escitalopram 24 hours before  Methamphetamine 24 hours before  Methylphenidate (Concerta, Metadate, Methylin, Ritalin)  20 hours before  Paroxetine 24 hours before  Selegilene 48 hours before  Sertraline 3 days before  If you are breastfeeding, or if there is any chance you are pregnant, please tell the scheduler or technologist (the person who will help you prepare for your scan). How is the scan done? When you first arrive, we will ask you to drink a small cup of water with potassium iodine in it. This water may have a metallic taste.  An hour after you drink the potassium iodine water, the technologist will inject a small amount of tracer into a vein in your arm or hand through your IV.  You must stay in the department for 30 minutes after the injection.  You will then have a break for 3 hours. It is OK to eat and drink during this break.  You must return to the clinic after this 3-hour break to have images of your brain taken.  Then, 4 hours after you receive your tracer injection, the technologist will take images of your brain with the gamma camera. You will lie flat on the exam table while these images are being taken.  You must not move while the camera is taking pictures. If you move, the pictures will be blurry and may have to be taken again.  Taking the images will take 40 to 45 minutes. Your total time in the imaging room will be about 1 hour.  You may also have a low-dose CT scan of your brain to help confirm any results. A CT scan is another  way to take images inside your body.  It will take about 5 hours from the time you drink the potassium iodine water until the scans are complete. What will I feel during the scan? The technologist will help make you as comfortable as possible on the exam table for the scan.  You may feel some minor discomfort from the IV.  Lying still on the exam table may be hard for some patients.  The camera will be close to your head. This may make you feel confined or uneasy (claustrophobic). Please tell the doctor who referred you for this scan if you know you are  claustrophobic. Are there any side effects from the scan? Most of the radioactivity from the tracer will pass out of your body in your urine or stool. The rest simply goes away over time.  Bad reactions to this scan are very rare. Fewer than 1% of patients (fewer than 1 out of 100) have a bad reaction. Reactions may include headache, nausea, vertigo (dizziness), or dry mouth. How do I get the results? When the test is over, the nuclear medicine doctor will review your images, prepare a written report, and talk with your doctor about the results. Your doctor will then talk with you about the results and your treatment options. If you needed to stop taking any medicines on the day of your scan, ask your doctor when to start taking them again.  The potentially interfering drugs consist of: amoxapine, amphetamine, benztropine, bupropion, buspirone, citalopram, cocaine, mazindol, methamphetamine, methylphenidate, norephedrine, phentermine, escitalopram, phenylpropanolamine, selegiline, paroxetine, and sertraline    Memory Compensation Strategies  1. Use "WARM" strategy.  W= write it down  A= associate it  R= repeat it  M= make a mental note  2.   You can keep a Glass blower/designer.  Use a 3-ring notebook with sections for the following: calendar, important names and phone numbers,  medications, doctors' names/phone numbers, lists/reminders, and a section to journal what you did  each day.   3.    Use a calendar to write appointments down.  4.    Write yourself a schedule for the day.  This can be placed on the calendar or in a separate section of the Memory Notebook.  Keeping a  regular schedule can help memory.  5.    Use medication organizer with sections for each day or morning/evening pills.  You may need help loading it  6.    Keep a basket, or pegboard by the door.  Place items that you need to take out with you in the basket or on the pegboard.  You may also want to  include a message  board for reminders.  7.    Use sticky notes.  Place sticky notes with reminders in a place where the task is performed.  For example: " turn off the  stove" placed by the stove, "lock the door" placed on the door at eye level, " take your medications" on  the bathroom mirror or by the place where you normally take your medications.  8.    Use alarms/timers.  Use while cooking to remind yourself to check on food or as a reminder to take your medicine, or as a  reminder to make a call, or as a reminder to perform another task, etc.

## 2021-01-16 NOTE — Progress Notes (Signed)
[name]    Provider:  Dr Jaynee Eagles Requesting Provider: Derinda Late, MD Primary Care Provider:  Derinda Late, MD  CC:  Headache, memory loss, gait anormality  HPI:  Marie Ford is a 75 y.o. female here as requested by Derinda Late, MD for "headaches, ataxia and cognitive impairment".  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  I reviewed Dr. Deboraha Sprang notes: Over the last year she has had chronic symptoms of headaches, CT scan of the sinuses in April showed sphenoid sinusitis, she had similar findings the year before, she saw Dr. [name] in ENT in April of this year and he did not feel the sinusitis required surgery was necessarily the cause of her headaches.  Associated with her headaches have been increasing symptoms of anxiety and depression, much of this related to stress, recently improved on fluoxetine, however the headaches fatigue and nausea have continued.  She also complains of ataxia over the last few months a few times a week as well as cognitive impairment short-term memory deficits and she is interested in more formal neuropsychological testing.  MRI of the brain in October was unremarkable but also showed the sphenoid sinusitis.   She is here with her friend of 35 years, headaches are improved since surgery and antibiotic. Over the last 2 years she has had cognitive decline and physicial decline and lots of stress, hip replace,ent, 18-year relationship ended, she had to sell a house sh eloved  and some communication problems with daughter, headaches are not so much the issue anymore. She has tremors under stress and feels "jittery". The left side doesn't work as well on the left side, she has imbalance on the left side, grip is weakness on the left side, hand writing has changed has gotten smaller. She feels clumsy and slow, her friend provides much information and says she is having difficulty with fly fishing and she is a well known for her fly fishing, her gait is different and she is weak, chronic fatigue, smell and taste is pretty much intact but not great she can't tell the difference between teas, some of the scent is gone, swallowing is ok, tremor is resting and with action, worse when under stress, left is worse. Friend has noticed her memory is declining more than she thinks is normal, she is not sleeping well at all, trouble falling asleep. She tries to meditate, she may try to eat something, she takes Xanax sometimes. Her gait is different, her left side is affected, hard to move that side and she doesn't swing her left arm. She walks her dog every day. Difficulty with the left hand with coordination, no falls, feels like she is shuffling, not lightheaded, she feels unbalanced, difficulty tieing the flys for fly fishing. Father died of stroke, no parkinson's disease. She is having difficulty remembering to do things, she loses steps, she has compensation techniques and uses her calendars, not getting lost, no hallucinations or delusions, no acting out dreams, but she is having vivid dreams, more dreaming, much more emotional and depressed but not overreacting. Friend states he looks sadder more flat in affect.   Reviewed  notes, labs and imaging from outside physicians, which showed:   October 07, 2020: CMP with BUN 24, creatinine 1.12, glucose 101, otherwise unremarkable, TSH 2.59, sed rate 17 normal, CBC normal.  CT Sinuses 11/10/2020: reviewed report and personally reviewed images (also  reviewed with patient) FINDINGS: Paranasal sinuses:  Frontal: Aplastic on the left and hypoplastic on the right  Ethmoid: Frothy secretions in the posterior left ethmoid air cell.  Maxillary: Small presumed retention cysts on the right more than left.  Sphenoid: Chronic left sphenoid sinusitis with complete opacification and sclerotic wall thickening. Sclerotic wall thickening has progressed and the ostium appears covered by bone currently.  Right ostiomeatal unit: Patent.  Left ostiomeatal unit: Patent.  Nasal passages: Patent. Intact nasal septum is essentially midline.  Anatomy: No pneumatization superior to anterior ethmoid notches. Sellar sphenoid pneumatization pattern. No dehiscence of carotid or optic canals. No onodi cell.  Other: No incidental finding on soft tissue windows.  IMPRESSION: 1. Chronic left sphenoid sinusitis with progressive sclerotic wall thickening since May 2021. The ostium now appears covered by calcification. 2. Continued frothy secretions in the left posterior ethmoid air Cell.  MRI brain 09/30/2020: Mild  chronic microvascular ischemic changes, typical for age.(personally reviewed and agree with findings) reviewed report and personally reviewed images (also reviewed with patient)  Review of Systems: Patient complains of symptoms per HPI as well as the following symptoms: depression, fatigue. Pertinent negatives and positives per HPI. All others negative.   Social History   Socioeconomic History  . Marital status: Single    Spouse name: Not on file  . Number of children: Not on file  . Years of education: Not on file  . Highest education level: Doctorate  Occupational History  . Not on file  Tobacco Use  . Smoking status: Never Smoker  . Smokeless tobacco: Never Used  Vaping Use  . Vaping Use: Never used  Substance and Sexual Activity  . Alcohol use: Yes    Comment: occ wine  . Drug use: No  . Sexual activity: Not  Currently    Birth control/protection: Post-menopausal  Other Topics Concern  . Not on file  Social History Narrative   Lives at home alone   Right handed   Caffeine: rarely   Social Determinants of Health   Financial Resource Strain: Not on file  Food Insecurity: Not on file  Transportation Needs: Not on file  Physical Activity: Not on file  Stress: Not on file  Social Connections: Not on file  Intimate Partner Violence: Not on file    Family History  Problem Relation Age of Onset  . Congestive Heart Failure Mother   . CVA Father   . Heart failure Father   . Liver cancer Brother   . Colon cancer Brother 18  . Colitis Brother   . Non-Hodgkin's lymphoma Sister   . Hypertension Brother   . Gout Brother   . Alcohol abuse Sister   . Melanoma Daughter   . Esophageal cancer Neg Hx   . Rectal cancer Neg Hx   . Stomach cancer Neg Hx   . Colon polyps Neg Hx     Past Medical History:  Diagnosis Date  . Chronic kidney disease    stage 3   . Chronic sphenoidal sinusitis   . Degenerative arthritis of hip    requirde surgery 2009  . Depression 2010  . Diplopia 2011   in left lateral gaze   . Diplopia    " my eyes dont  track together, i have corrective prisms in my eye glasses"   . Diverticulosis 2006   noted in a colonoscopy  . Esophageal reflux 2013  . Hypertension   . IGT (impaired glucose tolerance) 2011   denies hx of diabetes   . Insomnia 2009  . Mixed hyperlipidemia 2000  . Osteoarthritis   . Osteopenia 2005  . Other and unspecified hyperlipidemia 2000  . Other specified disease of sebaceous glands   . Palpitations 2009   secondary to PSVT  . PNA (pneumonia) 2014  . Psoriasis 2004  . PSVT (paroxysmal supraventricular tachycardia) ([place]) 2009   "report it has been some time since i had that "   . Situational stress   . Venous insufficiency since 2017    Patient Active Problem List   Diagnosis Date Noted  . OA (osteoarthritis) of hip 08/19/2019  .  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 Diverticular disease 07/10/2017  . Gastroesophageal reflux disease 07/10/2017  . Osteopenia 07/10/2017  . Insomnia 06/03/2016  . Essential hypertension 03/26/2014  . Hyperlipidemia 03/01/2014  . Other specified postprocedural states 03/01/2014  . Exertional dyspnea 02/07/2014  . 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 Diplopia 12/15/2013  . 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    Past Surgical History:  Procedure Laterality Date  . CATARACT EXTRACTION W/ INTRAOCULAR LENS  IMPLANT, BILATERAL    . COLONOSCOPY  01/30/2010   Sharlett Iles  . ETHMOIDECTOMY Left 11/17/2020   Procedure: LEFT SIDED TOTAL ETHMOIDECTOMY;  Surgeon: Rozetta Nunnery, MD;  Location: [facility];  Service: ENT;  Laterality: Left;  . EYE SURGERY    . HEMORRHOID SURGERY    . JOINT REPLACEMENT    . LASIK    . left medial and superior rectus muscle recesion for diplopia correction    . SINUS ENDO WITH FUSION Left 11/17/2020   Procedure: SINUS ENDOSCOPY WITH FUSION NAVIGATION;  Surgeon: Rozetta Nunnery, MD;  Location: [facility];  Service: ENT;  Laterality: Left;  . SPHENOIDECTOMY Left 11/17/2020   Procedure: LEFT SIDED SPHENOIDECTOMY WITH TISSUE REMOVAL;  Surgeon: Rozetta Nunnery, MD;  Location: [facility];  Service: ENT;  Laterality: Left;  . surgery of right eye rectus muscles for diplpia correction   2019  . TOTAL HIP ARTHROPLASTY Right 2010  . TOTAL HIP ARTHROPLASTY Left 08/19/2019   Procedure: TOTAL HIP ARTHROPLASTY ANTERIOR APPROACH;  Surgeon: Gaynelle Arabian, MD;  Location: [facility];  Service: Orthopedics;  Laterality: Left;  122mn  . TURBINATE REDUCTION Left 11/17/2020   Procedure: LEFT SIDED TURBINATE REDUCTION;  Surgeon: NRozetta Nunnery MD;  Location: [facility]  Service: ENT;  Laterality: Left;  . URETHRAL SLING     midurethral sling with TVT Exact and Cystoscopy    Current  Outpatient Medications  Medication Sig Dispense Refill  . Calcium Carb-Cholecalciferol (CALCIUM CARBONATE-VITAMIN D3) 600-400 MG-UNIT TABS Take 1 tablet by mouth every other day.     . cephALEXin (KEFLEX) 500 MG capsule Take 1 capsule (500 mg total) by mouth 2 (two) times daily for 14 days. 28 capsule 0  . ezetimibe (ZETIA) 10 MG tablet ezetimibe 10 mg tablet  Take 1 tablet every day by oral route.    .Marland KitchenFLUoxetine (PROZAC) 20 MG capsule     . Multiple Vitamin (MULTIVITAMIN WITH MINERALS) TABS tablet Take 1 tablet by mouth daily. Women's Multivitamin    . omeprazole (PRILOSEC) 20 MG capsule Take 20 mg by mouth daily as needed (with meloxicam use).    .Marland Kitchen  Polyethyl Glycol-Propyl Glycol (SYSTANE) 0.4-0.3 % SOLN Place 1 drop into both eyes 3 (three) times daily as needed (dry/irritated eyes.).    Marland Kitchen pravastatin (PRAVACHOL) 80 MG tablet Take 80 mg by mouth every evening.     . triamcinolone (NASACORT) 55 MCG/ACT AERO nasal inhaler Place 2 sprays into the nose daily. Use at night 1 each 12  . triamterene-hydrochlorothiazide (MAXZIDE-25) 37.5-25 MG tablet Take 1 tablet by mouth daily.     No current facility-administered medications for this visit.    Allergies as of 01/16/2021 - Review Complete 01/16/2021  Allergen Reaction Noted  . Erythromycin Shortness Of Breath and Rash   . Macrobid [nitrofurantoin] Other (See Comments) 12/23/2017  . Penicillins Rash   . Sulfamethoxazole-trimethoprim Rash 10/19/2020    Vitals: BP 122/79 (BP Location: Left Arm, Patient Position: Sitting)   Pulse 78   Ht 5' 6.5" (1.689 m)   Wt 163 lb (73.9 kg)   BMI 25.91 kg/m  Last Weight:  Wt Readings from Last 1 Encounters:  01/16/21 163 lb (73.9 kg)   Last Height:   Ht Readings from Last 1 Encounters:  01/16/21 5' 6.5" (1.689 m)     Physical exam: Exam: Gen: NAD, conversant, well nourised,  well groomed                     CV: RRR, no MRG. No Carotid Bruits. No peripheral edema, warm, nontender Eyes:  Conjunctivae clear without exudates or hemorrhage  Neuro: Detailed Neurologic Exam  Speech:    Speech is normal; fluent and spontaneous with normal comprehension.  Cognition:  MMSE - Mini Mental State Exam 01/16/2021  Orientation to time 5  Orientation to Place 5  Registration 3  Attention/ Calculation 4  Recall 3  Language- name 2 objects 2  Language- repeat 1  Language- follow 3 step command 3  Language- read & follow direction 1  Write a sentence 1  Copy design 1  Total score 29       The patient is oriented to person, place, and time;     recent and remote memory intact;     language fluent;     normal attention, concentration,     fund of knowledge Cranial Nerves:    The pupils are equal, round, and reactive to light.pupils too small to visualize fundi. Visual fields are full to finger confrontation. Extraocular movements are intact. Trigeminal sensation is intact and the muscles of mastication are normal. The face is symmetric. The palate elevates in the midline. Hearing intact. Voice is normal. Shoulder shrug is normal. The tongue has normal motion without fasciculations.   Coordination:   Bradykinesia with FTN, difficulty with finger taps on the left  Gait: mild bradykinesias, low clearance and short stride, en-bloc turning, decreased arm swing  Motor Observation: Left >right postural and kinetic tremor with FTN  Tone:     increased tone left arm with facilitation  Posture:    Posture is normal. normal erect    Strength: Left left hip and leg flexion mild weakness, left arm deltoid and biceps mild weakness. Right strength is V/V in the upper and lower limbs.      Sensation: intact to LT     Reflex Exam:  DTR's:    Deep tendon reflexes in the upper and lower extremities are brisk bilaterally.   Toes:    The toes are downgoing bilaterally.   Clonus:    Clonus is absent.    Assessment/Plan:   75  y.o. female here as requested by Derinda Late, MD for  "headaches, ataxia and cognitive impairment".  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  - Patient is parkinsonian on exam(decreased arm swing, narrow gait with low clearance, left-sided weakness and decreased coordination, increased tone left arm with facilitation, slightly masked facies). She has a postural and action tremor - I did not note a resting tremor however. Need a DAT Scan to differentiate between essential tremor vs parkinson's disease.  - Gait abnormality : MRI cervical spine. (MRI brain normal) to evaluate for cervical spinal cord etiology 9she has brisk reflexes)   - Cognitive complaints: Blood work today, formal memory testing  - I agree with Dr. Sandi Mariscal and I do think the sphenoid sinusitis is likely the cause of her headaches especially since the headache has significantly improved with treatment (CT shows "Chronic left sphenoid sinusitis with progressive sclerotic wall thickening since May 2021. The ostium now appears covered by calcification". "Continued frothy secretions in the left posterior ethmoid air Cell.").   Cognitive decline: Formal memory testing, blood work (MRI brain ws normal). There is likely concomitant depression.  Fatigue: May consider sleep evaluation, she has very vivid dreams.    Orders Placed This Encounter  Procedures  . MR CERVICAL SPINE WO CONTRAST  . NM BRAIN DATSCAN TUMOR LOC INFLAM SPECT 1 DAY  . Vitamin B1  . Vitamin D, 25-hydroxy  . B12 and Folate Panel  . Methylmalonic acid, serum  . Homocysteine  . Multiple Myeloma Panel (SPEP&IFE w/QIG)   . Ambulatory referral to Neuropsychology   No orders of the defined types were placed in this encounter.   Cc: Derinda Late, MD,  Derinda Late, MD  Sarina Ill, MD  [facility] [address] [place], [place] [address]  Phone [phone] Fax [phone]  I spent over 105  minutes of face-to-face and non-face-to-face time with patient on the  1. Tremor   2. Parkinsonism, unspecified Parkinsonism type ([place])   3. Ataxia   4. Gait abnormality   5. Left-sided weakness   6. Coordination impairment    diagnosis.  This included previsit chart review, lab review, study review, order entry, electronic health record documentation, patient education on the different diagnostic and therapeutic options, counseling and coordination of care, risks and benefits of management, compliance, or risk factor reduction

## 2021-01-17 ENCOUNTER — Other Ambulatory Visit: Payer: Self-pay

## 2021-01-17 ENCOUNTER — Telehealth: Payer: Self-pay | Admitting: Neurology

## 2021-01-17 ENCOUNTER — Encounter (INDEPENDENT_AMBULATORY_CARE_PROVIDER_SITE_OTHER): Payer: Self-pay | Admitting: Otolaryngology

## 2021-01-17 ENCOUNTER — Ambulatory Visit (INDEPENDENT_AMBULATORY_CARE_PROVIDER_SITE_OTHER): Payer: Medicare PPO | Admitting: Otolaryngology

## 2021-01-17 VITALS — Temp 97.2°F

## 2021-01-17 DIAGNOSIS — Z4889 Encounter for other specified surgical aftercare: Secondary | ICD-10-CM

## 2021-01-17 NOTE — Telephone Encounter (Signed)
[employer] pending notes uploaded on portal

## 2021-01-17 NOTE — Progress Notes (Signed)
HPI: Marie Ford is a 75 y.o. female who presents 2 months  s/p left sphenoidotomy.  She is doing reasonably well but still having occasional headache.  She is breathing better..   Past Medical History:  Diagnosis Date  . Chronic kidney disease    stage 3   . Chronic sphenoidal sinusitis   . Degenerative arthritis of hip    requirde surgery 2009  . Depression 2010  . Diplopia 2011   in left lateral gaze   . Diplopia    " my eyes dont track together, i have corrective prisms in my eye glasses"   . Diverticulosis 2006   noted in a colonoscopy  . Esophageal reflux 2013  . Hypertension   . IGT (impaired glucose tolerance) 2011   denies hx of diabetes   . Insomnia 2009  . Mixed hyperlipidemia 2000  . Osteoarthritis   . Osteopenia 2005  . Other and unspecified hyperlipidemia 2000  . Other specified disease of sebaceous glands   . Palpitations 2009   secondary to PSVT  . PNA (pneumonia) 2014  . Psoriasis 2004  . PSVT (paroxysmal supraventricular tachycardia) ([facility]) 2009   "report it has been some time since i had that "   . Situational stress   . Venous insufficiency since 2017   Past Surgical History:  Procedure Laterality Date  . CATARACT EXTRACTION W/ INTRAOCULAR LENS  IMPLANT, BILATERAL    . COLONOSCOPY  01/30/2010   Jarold Motto  . ETHMOIDECTOMY Left 11/17/2020   Procedure: LEFT SIDED TOTAL ETHMOIDECTOMY;  Surgeon: Drema Halon, MD;  Location: [facility];  Service: ENT;  Laterality: Left;  . EYE SURGERY    . HEMORRHOID SURGERY    . JOINT REPLACEMENT    . LASIK    . left medial and superior rectus muscle recesion for diplopia correction    . SINUS ENDO WITH FUSION Left 11/17/2020   Procedure: SINUS ENDOSCOPY WITH FUSION NAVIGATION;  Surgeon: Drema Halon, MD;  Location: [facility];  Service: ENT;  Laterality: Left;  . SPHENOIDECTOMY Left 11/17/2020   Procedure: LEFT SIDED SPHENOIDECTOMY WITH TISSUE REMOVAL;  Surgeon: Drema Halon, MD;  Location: [facility];  Service: ENT;  Laterality: Left;  . surgery of right eye rectus muscles for diplpia correction   2019  . TOTAL HIP ARTHROPLASTY Right 2010  . TOTAL HIP ARTHROPLASTY Left 08/19/2019   Procedure: TOTAL HIP ARTHROPLASTY ANTERIOR APPROACH;  Surgeon: Ollen Gross, MD;  Location: [facility];  Service: Orthopedics;  Laterality: Left;   . TURBINATE REDUCTION Left 11/17/2020   Procedure: LEFT SIDED TURBINATE REDUCTION;  Surgeon: Drema Halon, MD;  Location: [facility];  Service: ENT;  Laterality: Left;  . URETHRAL SLING     midurethral sling with TVT Exact and Cystoscopy   Social History   Socioeconomic History  . Marital status: Single    Spouse name: Not on file  . Number of children: Not on file  . Years of education: Not on file  . Highest education level: Doctorate  Occupational History  . Not on file  Tobacco Use  . Smoking status: Never Smoker  . Smokeless tobacco: Never Used  Vaping Use  . Vaping Use: Never used  Substance and Sexual Activity  . Alcohol use: Yes    Comment: occ wine  . Drug use: No  . Sexual activity: Not Currently    Birth control/protection: Post-menopausal  Other Topics Concern  . Not  on file  Social History Narrative   Lives at home alone   Right handed   Caffeine: rarely   Social Determinants of Health   Financial Resource Strain: Not on file  Food Insecurity: Not on file  Transportation Needs: Not on file  Physical Activity: Not on file  Stress: Not on file  Social Connections: Not on file   Family History  Problem Relation Age of Onset  . Congestive Heart Failure Mother   . CVA Father   . Heart failure Father   . Liver cancer Brother   . Colon cancer Brother 51  . Colitis Brother   . Non-Hodgkin's lymphoma Sister   . Hypertension Brother   . Gout Brother   . Alcohol abuse Sister   . Melanoma Daughter   . Esophageal cancer Neg Hx   . Rectal cancer Neg  Hx   . Stomach cancer Neg Hx   . Colon polyps Neg Hx    Allergies  Allergen Reactions  . Erythromycin Shortness Of Breath and Rash  . Macrobid [Nitrofurantoin] Other (See Comments)    Pass out  . Penicillins Rash    Did it involve swelling of the face/tongue/throat, SOB, or low BP? No Did it involve sudden or severe rash/hives, skin peeling, or any reaction on the inside of your mouth or nose? No Did you need to seek medical attention at a hospital or doctor's office? No When did it last happen? More than 10 years ago If all above answers are "NO", may proceed with cephalosporin use.   . Sulfamethoxazole-Trimethoprim Rash   Prior to Admission medications   Medication Sig Start Date End Date Taking? Authorizing Provider  Calcium Carb-Cholecalciferol (CALCIUM CARBONATE-VITAMIN D3) 600-400 MG-UNIT TABS Take 1 tablet by mouth every other day.     [provider]  cephALEXin (KEFLEX) 500 MG capsule Take 1 capsule (500 mg total) by mouth 2 (two) times daily for 14 days. 01/03/21 01/17/21  Drema Halon, MD  ezetimibe (ZETIA) 10 MG tablet ezetimibe 10 mg tablet  Take 1 tablet every day by oral route. 02/25/19   [provider]  FLUoxetine (PROZAC) 20 MG capsule  10/10/20   [provider]  Multiple Vitamin (MULTIVITAMIN WITH MINERALS) TABS tablet Take 1 tablet by mouth daily. Women's Multivitamin    [provider]  omeprazole (PRILOSEC) 20 MG capsule Take 20 mg by mouth daily as needed (with meloxicam use).    [provider]  Polyethyl Glycol-Propyl Glycol (SYSTANE) 0.4-0.3 % SOLN Place 1 drop into both eyes 3 (three) times daily as needed (dry/irritated eyes.).    [provider]  pravastatin (PRAVACHOL) 80 MG tablet Take 80 mg by mouth every evening.     [provider]  triamcinolone (NASACORT) 55 MCG/ACT AERO nasal inhaler Place 2 sprays into the nose daily. Use at night 11/01/20   Drema Halon, MD   triamterene-hydrochlorothiazide (MAXZIDE-25) 37.5-25 MG tablet Take 1 tablet by mouth daily.    [provider]     Physical Exam: On nasal exam she still has a little bit of crusting in the region of the left sphenoid that was removed in the office today.  On nasal endoscopy the sphenoidotomy has mild inflammatory change but no gross mucopurulent discharge.  Minimal crusting in this region.   Assessment: S/p left sphenoidotomy  Plan: She will follow-up in 1 month for recheck.  She will continue with the Nasacort nasal spray at night.    [name], MD

## 2021-01-17 NOTE — Telephone Encounter (Signed)
Marie Ford Berkley Harvey: [id number] (exp. 01/17/21 to 02/16/21) order sent to GI. They will reach out to the patient to schedule.

## 2021-01-18 ENCOUNTER — Encounter: Payer: Medicare PPO | Attending: Psychology | Admitting: Psychology

## 2021-01-18 ENCOUNTER — Other Ambulatory Visit: Payer: Self-pay

## 2021-01-18 DIAGNOSIS — R413 Other amnesia: Secondary | ICD-10-CM

## 2021-01-18 DIAGNOSIS — G20C Parkinsonism, unspecified: Secondary | ICD-10-CM

## 2021-01-18 DIAGNOSIS — G20 Parkinson's disease: Secondary | ICD-10-CM | POA: Insufficient documentation

## 2021-01-18 DIAGNOSIS — R269 Unspecified abnormalities of gait and mobility: Secondary | ICD-10-CM

## 2021-01-19 ENCOUNTER — Encounter: Payer: Self-pay | Admitting: Psychology

## 2021-01-19 NOTE — Progress Notes (Signed)
Neuropsychological Consultation   Patient:   Marie Ford   DOB:   Mar 02, 1946  MR Number:  [id number]  Location:  [facility] PHYSICAL MEDICINE AND REHABILITATION [address] [place], [phone] [facility] [place] [address] Dept: [phone]           Date of Service:   01/18/2021  Start Time:   4 PM End Time:   6 PM  Today's visit was a new person visit that was conducted in my outpatient clinic office.  1 hour and 15 minutes were spent in clinical interview with 45 minutes directed towards records review, report writing and developing diagnostic considerations and testing protocols.  Provider/Observer:  Arley Phenix, Psy.D.       Clinical Neuropsychologist       Billing Code/Service: [id number]  Chief Complaint:    Marie Ford is a 75 year old female referred by Naomie Dean, MD for neuropsychological evaluation as part of the broader neurological evaluation.  The patient was initially referred to Dr. [name] by the patient's PCP [name], MD due to the development of significant headaches, ataxia and cognitive impairments.  The patient describes increasing tremors mostly on her left side, imbalance, fatigue, cognitive issues including increasing memory difficulties for learning new information increasing word finding difficulties.  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  The patient describes the development of an exacerbation of chronic headaches over the past year.  CT scan of sinuses showed sinusitis, which she also had similar findings 1 year prior.  The patient also has had increasing anxiety and depression and feels like it is related to stress with some improvement on fluoxetine.  Reason for Service:  Marie Ford  is a 75 year old female referred by Naomie Dean, MD for neuropsychological evaluation as part of the broader neurological evaluation.  The patient was initially referred to Dr. [name] by the patient's PCP [name], MD due to the development of significant headaches, ataxia and cognitive impairments.  The patient describes increasing tremors mostly on her left side, imbalance, fatigue, cognitive issues including increasing memory difficulties for learning new information increasing word finding difficulties.  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  The patient describes the development of an exacerbation of chronic headaches over the past year.  CT scan of sinuses showed sinusitis, which she also had similar findings 1 year prior.  The patient also has had increasing anxiety and depression and feels like it is related to stress with some improvement on fluoxetine.  The patient has a past medical history including ethmoid sinusitis, urinary tract infection, headache associated with her sinusitis and improvements on antibiotics, depression, pneumonia, insomnia, reports of subjective symptoms of cognitive impairments, intention tremor in her left hand primarily, hypertension, glucose disruption, stage III chronic kidney disease since 2015, mixed hyperlipidemia, osteopenia, diplopia left lateral gaze is 2011, venous insufficiency since 2017.  The patient describes her tremors as predominantly left-sided and reports that they are most noticeable with intention and purposeful movement but reports that sometimes she has them at rest.  She reports that they initially started after her last hip replacement surgery in  September 2020.  But reports that she has had "1 surgery after another."  The patient describes her memory  difficulties as difficulty learning new information and learning others names.  Patient reports that she stumbles when trying to find words and describes increasing circumlocution but no paraphasic errors.  She reports that often the words she is trying to find will come back later.  She reports that she has to think more about where she is going in her directions and is a much more cautious driver than the past.  The patient reports that her vision has been more of an issue recently and she has had 2 past eye surgeries.  Patient reports that she is having more fatigue and feels more jittery when she is stressed.  The patient describes some degree of insomnia and that she has usually gotten 6 to 8 hours of sleep each night but there is been more disturbance in sleep.  She reports that her handwriting has displayed some changes in that she describes it is more of micrographia type changes.  She reports that she has had an decreased appetite over the past 2 years.  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  The patient reports that she has been unable to do this activity at all due to disturbance in gait and balance issues.  The patient reports that she is more fatigued and weak on top of her balance disturbance.  The patient had an MRI conducted on 09/30/2020.  The impression was of no acute abnormality and only mild chronic microvascular ischemic changes that were typical for age.  Addition to note 02/17/2021: The patient had a DaTscan performed on 02/09/2021 which I have added to her note here.  The impression of this suggested marked decrease in activity within the left and right striata him that was slightly greater deficits on right.  Is noted that heads of the caudate nuclei showed significant decrease in activity, which was barely above background activity with decreased activity appearing slightly greater on right versus left.  While this pattern is reported  as being found in parkinsonian type pathology a DaTscan is not diagnostic in nature but more of an adjunctive test to aid in overall clinical diagnosis.  Behavioral Observation: Marie Ford  presents as a 75 y.o.-year-old Right Caucasian Female who appeared her stated age. her dress was Appropriate and she was Well Groomed and her manners were Appropriate to the situation.  her participation was indicative of Appropriate and Redirectable behaviors.  There were physical disabilities noted.  she displayed an appropriate level of cooperation and motivation.     Interactions:    Active Redirectable  Attention:   abnormal and attention span appeared shorter than expected for age  Memory:   abnormal; remote memory intact, recent memory impaired  Visuo-spatial:  not examined  Speech (Volume):  normal  Speech:   normal; some mild word finding difficulties noted with displays of circumlocution.  Thought Process:  Coherent and Relevant  Though Content:  WNL; not suicidal and not homicidal  Orientation:   person, place, time/date and situation  Judgment:   Good  Planning:   Good  Affect:    Anxious  Mood:    Dysphoric  Insight:   Good  Intelligence:   high  Marital Status/Living: The patient was born and raised in the [place] along with 4 siblings.  No major childhood illnesses were noted beyond measles mumps and common  flu.  Developmental milestones were achieved at the appropriate time.  The patient currently lives by herself and has done so since 1981.  Current Employment: The patient is retired.  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  She worked for 20 years with [employer] via bank.  Hobbies and interests have included flyfishing and reading.  Substance Use:  No concerns of substance abuse are reported.  Only a very occasional social drinking of wine.  Education:   The patient has received her  PhD level education.  Medical History:   Past Medical History:  Diagnosis Date  . Chronic kidney disease    stage 3   . Chronic sphenoidal sinusitis   . Degenerative arthritis of hip    requirde surgery 2009  . Depression 2010  . Diplopia 2011   in left lateral gaze   . Diplopia    " my eyes dont track together, i have corrective prisms in my eye glasses"   . Diverticulosis 2006   noted in a colonoscopy  . Esophageal reflux 2013  . Hypertension   . IGT (impaired glucose tolerance) 2011   denies hx of diabetes   . Insomnia 2009  . Mixed hyperlipidemia 2000  . Osteoarthritis   . Osteopenia 2005  . Other and unspecified hyperlipidemia 2000  . Other specified disease of sebaceous glands   . Palpitations 2009   secondary to PSVT  . PNA (pneumonia) 2014  . Psoriasis 2004  . PSVT (paroxysmal supraventricular tachycardia) (HCC) 2009   "report it has been some time since i had that "   . Situational stress   . Venous insufficiency since 2017         Patient Active Problem List   Diagnosis Date Noted  . OA (osteoarthritis) of hip 08/19/2019  . 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 Diverticular disease 07/10/2017  . Gastroesophageal reflux disease 07/10/2017  . Osteopenia 07/10/2017  . Insomnia 06/03/2016  . Essential hypertension 03/26/2014  . Hyperlipidemia 03/01/2014  . Other specified postprocedural states 03/01/2014  . Exertional dyspnea 02/07/2014  . 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 Diplopia 12/15/2013  . 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     Psychiatric History:  No prior psychiatric history although the patient has experienced some level of depression and anxiety around increasing physical limitations and other depressive symptomatology.  The patient is currently taking 20 mg daily fluoxetine and is reported that she has had a positive response to this medication.  Family Med/Psych History:  Family History  Problem Relation Age of Onset  .  Congestive Heart Failure Mother   . CVA Father   . Heart failure Father   . Liver cancer Brother   . Colon cancer Brother 38  . Colitis Brother   . Non-Hodgkin's lymphoma Sister   . Hypertension Brother   . Gout Brother   . Alcohol abuse Sister   . Melanoma Daughter   . Esophageal cancer Neg Hx   . Rectal cancer Neg Hx   . Stomach cancer Neg Hx   . Colon polyps Neg Hx      Impression/DX:  Marie Ford is a 75 year old female referred by Naomie Dean, MD for neuropsychological evaluation as part of the broader neurological evaluation.  The patient was initially referred to Dr. [name] by the patient's PCP [name], MD due to the development of significant headaches, ataxia and cognitive impairments.  The patient describes increasing tremors mostly on her  left side, imbalance, fatigue, cognitive issues including increasing memory difficulties for learning new information increasing word finding difficulties.  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  The patient describes the development of an exacerbation of chronic headaches over the past year.  CT scan of sinuses showed sinusitis, which she also had similar findings 1 year prior.  The patient also has had increasing anxiety and depression and feels like it is related to stress with some improvement on fluoxetine.  Disposition/Plan:  We set the patient up for formal neuropsychological testing.  She will be administered at core battery of the Wechsler Adult Intelligence Scale-IV as well as the Wechsler Memory Scale's for older adults.  We will also look at other measures including measures of fine motor control and motor strength including the grooved pegboard and the hand dynamometer test as well as looking at some other executive functioning measures  along with expressive language measures.  Diagnosis:    Memory loss  Parkinsonian tremor (HCC)  Abnormality of gait         Electronically Signed   _______________________ Arley Phenix, Psy.D. Clinical Neuropsychologist

## 2021-01-27 LAB — MULTIPLE MYELOMA PANEL, SERUM
Albumin SerPl Elph-Mcnc: 3.7 g/dL (ref 2.9–4.4)
Albumin/Glob SerPl: 1.3 (ref 0.7–1.7)
Alpha 1: 0.2 g/dL (ref 0.0–0.4)
Alpha2 Glob SerPl Elph-Mcnc: 0.8 g/dL (ref 0.4–1.0)
B-Globulin SerPl Elph-Mcnc: 0.9 g/dL (ref 0.7–1.3)
Gamma Glob SerPl Elph-Mcnc: 1.1 g/dL (ref 0.4–1.8)
Globulin, Total: 3 g/dL (ref 2.2–3.9)
IgA/Immunoglobulin A, Serum: 205 mg/dL (ref 64–422)
IgG (Immunoglobin G), Serum: 1082 mg/dL (ref 586–1602)
IgM (Immunoglobulin M), Srm: 119 mg/dL (ref 26–217)
Total Protein: 6.7 g/dL (ref 6.0–8.5)

## 2021-01-27 LAB — VITAMIN B1: Thiamine: 186.2 nmol/L (ref 66.5–200.0)

## 2021-01-27 LAB — B12 AND FOLATE PANEL
Folate: 18.1 ng/mL (ref >3.0)
Vitamin B-12: 776 pg/mL (ref 232–1245)

## 2021-01-27 LAB — HOMOCYSTEINE: Homocysteine: 11.7 umol/L (ref 0.0–19.2)

## 2021-01-27 LAB — VITAMIN D 25 HYDROXY (VIT D DEFICIENCY, FRACTURES): Vit D, 25-Hydroxy: 57.8 ng/mL (ref 30.0–100.0)

## 2021-01-27 LAB — METHYLMALONIC ACID, SERUM: Methylmalonic Acid: 355 nmol/L (ref 0–378)

## 2021-01-29 ENCOUNTER — Other Ambulatory Visit: Payer: Self-pay

## 2021-01-29 ENCOUNTER — Ambulatory Visit
Admission: RE | Admit: 2021-01-29 | Discharge: 2021-01-29 | Disposition: A | Discharge status: Home / Self Care | Payer: Medicare PPO | Source: Ambulatory Visit | Attending: Neurology | Admitting: Neurology

## 2021-01-29 DIAGNOSIS — R278 Other lack of coordination: Secondary | ICD-10-CM

## 2021-01-29 DIAGNOSIS — R269 Unspecified abnormalities of gait and mobility: Secondary | ICD-10-CM | POA: Diagnosis not present

## 2021-01-29 DIAGNOSIS — G20 Parkinson's disease: Secondary | ICD-10-CM

## 2021-01-29 DIAGNOSIS — R270 Ataxia, unspecified: Secondary | ICD-10-CM

## 2021-01-29 DIAGNOSIS — R251 Tremor, unspecified: Secondary | ICD-10-CM

## 2021-01-29 DIAGNOSIS — R531 Weakness: Secondary | ICD-10-CM

## 2021-01-31 ENCOUNTER — Ambulatory Visit: Payer: Medicare PPO | Admitting: Psychology

## 2021-02-09 ENCOUNTER — Other Ambulatory Visit: Payer: Self-pay

## 2021-02-09 ENCOUNTER — Encounter (HOSPITAL_COMMUNITY)
Admission: RE | Admit: 2021-02-09 | Discharge: 2021-02-09 | Disposition: A | Discharge status: Home / Self Care | Payer: Medicare PPO | Source: Ambulatory Visit | Attending: Neurology | Admitting: Neurology

## 2021-02-09 DIAGNOSIS — R278 Other lack of coordination: Secondary | ICD-10-CM

## 2021-02-09 DIAGNOSIS — G20C Parkinsonism, unspecified: Secondary | ICD-10-CM

## 2021-02-09 DIAGNOSIS — G20 Parkinson's disease: Secondary | ICD-10-CM | POA: Diagnosis present

## 2021-02-09 DIAGNOSIS — R251 Tremor, unspecified: Secondary | ICD-10-CM

## 2021-02-09 DIAGNOSIS — R269 Unspecified abnormalities of gait and mobility: Secondary | ICD-10-CM

## 2021-02-09 DIAGNOSIS — R531 Weakness: Secondary | ICD-10-CM | POA: Diagnosis present

## 2021-02-09 DIAGNOSIS — R270 Ataxia, unspecified: Secondary | ICD-10-CM | POA: Diagnosis present

## 2021-02-09 MED ORDER — POTASSIUM IODIDE (ANTIDOTE) 130 MG PO TABS
130.0000 mg | ORAL_TABLET | Freq: Once | ORAL | Status: AC
  Administered 2021-02-09: 130 mg via ORAL

## 2021-02-09 MED ORDER — POTASSIUM IODIDE (ANTIDOTE) 130 MG PO TABS
ORAL_TABLET | ORAL | Status: AC
  Filled 2021-02-09: qty 1

## 2021-02-09 MED ORDER — IOFLUPANE I 123 185 MBQ/2.5ML IV SOLN
4.8600 | Freq: Once | INTRAVENOUS | Status: AC | PRN
  Administered 2021-02-09: 4.86 via INTRAVENOUS
  Filled 2021-02-09: qty 5

## 2021-02-14 ENCOUNTER — Encounter (INDEPENDENT_AMBULATORY_CARE_PROVIDER_SITE_OTHER): Payer: Self-pay | Admitting: Otolaryngology

## 2021-02-14 ENCOUNTER — Encounter (INDEPENDENT_AMBULATORY_CARE_PROVIDER_SITE_OTHER): Payer: Medicare PPO | Admitting: Otolaryngology

## 2021-02-14 ENCOUNTER — Other Ambulatory Visit: Payer: Self-pay

## 2021-02-14 ENCOUNTER — Ambulatory Visit (INDEPENDENT_AMBULATORY_CARE_PROVIDER_SITE_OTHER): Payer: Medicare PPO | Admitting: Otolaryngology

## 2021-02-14 VITALS — Temp 97.5°F

## 2021-02-14 DIAGNOSIS — J323 Chronic sphenoidal sinusitis: Secondary | ICD-10-CM

## 2021-02-14 DIAGNOSIS — H9041 Sensorineural hearing loss, unilateral, right ear, with unrestricted hearing on the contralateral side: Secondary | ICD-10-CM

## 2021-02-14 NOTE — Progress Notes (Addendum)
HPI: Marie Ford is a 75 y.o. female who returns today for evaluation of fullness in her right ear.  She had audiologic testing performed today that demonstrated a low-frequency right ear mixed but mostly sensorineural hearing loss.  We compared this to a previous hearing test performed at [facility] 2 years ago that showed a mild right ear sensorineural hearing loss compared to the left.  She has had no significant change in hearing at 1000 frequency and above compared to previous hearing test.  Her SRT's were 25 DB on the right and 15 DB on the left with good word discrimination in both ears.  She had type A tympanograms bilaterally. She is 3 months status post left sphenoidotomy for chronic left sphenoid sinus disease.  She is doing reasonably well concerning this with no significant headaches.  She is breathing better through the left side.. She is presently being evaluated for Parkinson's disease.  Past Medical History:  Diagnosis Date  . Chronic kidney disease    stage 3   . Chronic sphenoidal sinusitis   . Degenerative arthritis of hip    requirde surgery 2009  . Depression 2010  . Diplopia 2011   in left lateral gaze   . Diplopia    " my eyes dont track together, i have corrective prisms in my eye glasses"   . Diverticulosis 2006   noted in a colonoscopy  . Esophageal reflux 2013  . Hypertension   . IGT (impaired glucose tolerance) 2011   denies hx of diabetes   . Insomnia 2009  . Mixed hyperlipidemia 2000  . Osteoarthritis   . Osteopenia 2005  . Other and unspecified hyperlipidemia 2000  . Other specified disease of sebaceous glands   . Palpitations 2009   secondary to PSVT  . PNA (pneumonia) 2014  . Psoriasis 2004  . PSVT (paroxysmal supraventricular tachycardia) (HCC) 2009   "report it has been some time since i had that "   . Situational stress   . Venous insufficiency since 2017   Past Surgical History:  Procedure Laterality Date  . CATARACT EXTRACTION W/  INTRAOCULAR LENS  IMPLANT, BILATERAL    . COLONOSCOPY  01/30/2010   Jarold Motto  . ETHMOIDECTOMY Left 11/17/2020   Procedure: LEFT SIDED TOTAL ETHMOIDECTOMY;  Surgeon: Drema Halon, MD;  Location: [facility];  Service: ENT;  Laterality: Left;  . EYE SURGERY    . HEMORRHOID SURGERY    . JOINT REPLACEMENT    . LASIK    . left medial and superior rectus muscle recesion for diplopia correction    . SINUS ENDO WITH FUSION Left 11/17/2020   Procedure: SINUS ENDOSCOPY WITH FUSION NAVIGATION;  Surgeon: Drema Halon, MD;  Location: [facility];  Service: ENT;  Laterality: Left;  . SPHENOIDECTOMY Left 11/17/2020   Procedure: LEFT SIDED SPHENOIDECTOMY WITH TISSUE REMOVAL;  Surgeon: Drema Halon, MD;  Location: [facility];  Service: ENT;  Laterality: Left;  . surgery of right eye rectus muscles for diplpia correction   2019  . TOTAL HIP ARTHROPLASTY Right 2010  . TOTAL HIP ARTHROPLASTY Left 08/19/2019   Procedure: TOTAL HIP ARTHROPLASTY ANTERIOR APPROACH;  Surgeon: Ollen Gross, MD;  Location: [facility];  Service: Orthopedics;  Laterality: Left;   . TURBINATE REDUCTION Left 11/17/2020   Procedure: LEFT SIDED TURBINATE REDUCTION;  Surgeon: Drema Halon, MD;  Location: [facility];  Service: ENT;  Laterality: Left;  . URETHRAL SLING  midurethral sling with TVT Exact and Cystoscopy   Social History   Socioeconomic History  . Marital status: Single    Spouse name: Not on file  . Number of children: Not on file  . Years of education: Not on file  . Highest education level: Doctorate  Occupational History  . Not on file  Tobacco Use  . Smoking status: Never Smoker  . Smokeless tobacco: Never Used  Vaping Use  . Vaping Use: Never used  Substance and Sexual Activity  . Alcohol use: Yes    Comment: occ wine  . Drug use: No  . Sexual activity: Not Currently    Birth control/protection: Post-menopausal   Other Topics Concern  . Not on file  Social History Narrative   Lives at home alone   Right handed   Caffeine: rarely   Social Determinants of Health   Financial Resource Strain: Not on file  Food Insecurity: Not on file  Transportation Needs: Not on file  Physical Activity: Not on file  Stress: Not on file  Social Connections: Not on file   Family History  Problem Relation Age of Onset  . Congestive Heart Failure Mother   . CVA Father   . Heart failure Father   . Liver cancer Brother   . Colon cancer Brother 9  . Colitis Brother   . Non-Hodgkin's lymphoma Sister   . Hypertension Brother   . Gout Brother   . Alcohol abuse Sister   . Melanoma Daughter   . Esophageal cancer Neg Hx   . Rectal cancer Neg Hx   . Stomach cancer Neg Hx   . Colon polyps Neg Hx    Allergies  Allergen Reactions  . Erythromycin Shortness Of Breath and Rash  . Macrobid [Nitrofurantoin] Other (See Comments)    Pass out  . Penicillins Rash    Did it involve swelling of the face/tongue/throat, SOB, or low BP? No Did it involve sudden or severe rash/hives, skin peeling, or any reaction on the inside of your mouth or nose? No Did you need to seek medical attention at a hospital or doctor's office? No When did it last happen? More than 10 years ago If all above answers are "NO", may proceed with cephalosporin use.   . Sulfamethoxazole-Trimethoprim Rash   Prior to Admission medications   Medication Sig Start Date End Date Taking? Authorizing Provider  Calcium Carb-Cholecalciferol (CALCIUM CARBONATE-VITAMIN D3) 600-400 MG-UNIT TABS Take 1 tablet by mouth every other day.     [provider]  ezetimibe (ZETIA) 10 MG tablet ezetimibe 10 mg tablet  Take 1 tablet every day by oral route. 02/25/19   [provider]  FLUoxetine (PROZAC) 20 MG capsule  10/10/20   [provider]  Multiple Vitamin (MULTIVITAMIN WITH MINERALS) TABS tablet Take 1 tablet by mouth daily. Women's  Multivitamin    [provider]  omeprazole (PRILOSEC) 20 MG capsule Take 20 mg by mouth daily as needed (with meloxicam use).    [provider]  Polyethyl Glycol-Propyl Glycol (SYSTANE) 0.4-0.3 % SOLN Place 1 drop into both eyes 3 (three) times daily as needed (dry/irritated eyes.).    [provider]  pravastatin (PRAVACHOL) 80 MG tablet Take 80 mg by mouth every evening.     [provider]  triamcinolone (NASACORT) 55 MCG/ACT AERO nasal inhaler Place 2 sprays into the nose daily. Use at night 11/01/20   Drema Halon, MD  triamterene-hydrochlorothiazide (MAXZIDE-25) 37.5-25 MG tablet Take 1 tablet by  mouth daily.    [provider]     Positive ROS: Otherwise negative  All other systems have been reviewed and were otherwise negative with the exception of those mentioned in the HPI and as above.  Physical Exam: Constitutional: Alert, well-appearing, no acute distress Ears: External ears without lesions or tenderness. Ear canals are clear bilaterally with intact, clear TMs bilaterally. Nasal: External nose without lesions. Septum midline with mild crusting in the posterior nasal cavity it was cleaned with suction.  There is no active mucopurulent discharge noted..  The sphenoidotomy appears patent but may be occluded from scar tissue. Oral: Lips and gums without lesions. Tongue and palate mucosa without lesions. Posterior oropharynx clear. Neck: No palpable adenopathy or masses Respiratory: Breathing comfortably  Skin: No facial/neck lesions or rash noted.  Procedures  Assessment: Right ear low-frequency sensorineural hearing loss most likely causing the feeling of stopped up right ear.  The TM is clear with good mobility on tympanometry. She is 3 months status post left sphenoidotomy.  Plan: If she has any further headaches or pain related to the left sphenoid sinus would recommend referral to [facility] for further surgery  if required as she has very thickened dense bone in this area. Concerning her mild right ear hearing loss this is minimal and I am not sure she needs hearing aids at this point but would recommend repeat audiologic testing in 1 year. She will follow-up in 3 to 4 months for recheck.  She will follow-up earlier if she has any problems.   [name], MD

## 2021-02-17 NOTE — Addendum Note (Signed)
Addended by: Dillard Cannon E on: 02/17/2021 12:51 PM   Modules accepted: Level of Service

## 2021-02-20 ENCOUNTER — Encounter (INDEPENDENT_AMBULATORY_CARE_PROVIDER_SITE_OTHER): Payer: Self-pay

## 2021-03-17 ENCOUNTER — Encounter: Payer: Medicare PPO | Attending: Psychology | Admitting: Psychology

## 2021-03-17 ENCOUNTER — Other Ambulatory Visit: Payer: Self-pay

## 2021-03-17 ENCOUNTER — Encounter: Payer: Self-pay | Admitting: Psychology

## 2021-03-17 DIAGNOSIS — G20 Parkinson's disease: Secondary | ICD-10-CM | POA: Diagnosis not present

## 2021-03-17 DIAGNOSIS — R413 Other amnesia: Secondary | ICD-10-CM | POA: Insufficient documentation

## 2021-03-17 DIAGNOSIS — R269 Unspecified abnormalities of gait and mobility: Secondary | ICD-10-CM | POA: Insufficient documentation

## 2021-03-17 DIAGNOSIS — F5102 Adjustment insomnia: Secondary | ICD-10-CM | POA: Insufficient documentation

## 2021-03-17 NOTE — Progress Notes (Addendum)
Neuropsychology Note  Marie Ford completed 240 minutes of neuropsychological testing with this provider. Vision and hearing were adequate for the purposes of testing. She had no trouble understanding test instructions. Motivation and effort were excellent and she persisted well. The patient did not appear overtly distressed by the testing session, per behavioral observation or via self-report. Rest breaks were offered.   Clinical Decision Making: In considering the patient's current level of functioning, level of presumed impairment, nature of symptoms, emotional and behavioral responses during the interview, level of literacy, and observed level of motivation/effort, a battery of tests was selected. Changes were made as deemed necessary based on patient performance on testing, behavioral observations and additional pertinent factors such as those listed above.  Tests Administered:   [employer] (BNT)  Controlled Oral Word Association Test (COWAT; FAS & Animals)   Finger Tapping Test (FTT)  Grooved Pegboard     Hand Dynamometer    Trail Making Test (TMT; Part A & B)  Wechsler Adult Intelligence Scale, 4th Edition (WAIS-IV)   Wechsler Memory Scale, 4th Edition (WMS-IV): Older Adult Battery   Results: (to be included once scored)      Raw  T-Score Percentile Description  BNT  Total   58/60  61  86  Above Average  COWAT  FAS:   42  49  46  Average   Repetition: 4  ---  ---  WNL   Intrusion: 5  ---  ---  Below Expectation   Animals  20  49  46  Average   Repetition: 0   Intrusion:  0 FTT  Dominant (Rt.): 40 taps 48  42  Average  Non-Dominant: 20 taps 22  <1  Exceptionally Low   Grooved Pegboard  Dominant (Rt.): 96"  41  18  Below Average  Non-Dominant: D/C  <20  <1  Exceptionally Low  Hand Dynamometer    Dominant (Rt.): 14 Kg  ---   26-51  Average  Non-Dominant: 15 Kg.  ---  26-51  Average  TMT  Part A   36"  48  42  Average  Part B   98"    45  31  Average   WAIS-IV  Composite Score Summary  Scale Sum of Scaled Scores Composite Score Percentile Rank 95% Conf. Interval Qualitative Description  Verbal Comprehension 43 VCI 125 95 118-130 Superior  Perceptual Reasoning 35 PRI 109 73 102-115 Average  Working Memory 19 WMI 97 42 90-104 Average  Processing Speed 20 PSI 100 50 92-108 Average  Full Scale 117 FSIQ 111 77 107-115 High Average  General Ability 78 GAI 120 91 114-124 Superior   Verbal Comprehension Subtests Summary  Subtest Raw Score Scaled Score Percentile Rank Reference Group Scaled Score SEM  Similarities 27 12 75 11 1.12  Vocabulary 54 16 98 17 0.73  Information 23 15 95 16 0.73   Perceptual Reasoning Subtests Summary  Subtest Raw Score Scaled Score Percentile Rank Reference Group Scaled Score SEM  Block Design 28 10 50 6 1.27  Matrix Reasoning 17 14 91 9 0.73  Visual Puzzles 11 11 63 7 0.99   Working Librarian, academic Raw Score Scaled Score Percentile Rank Reference Group Scaled Score SEM  Digit Span 21 9 37 6 0.79  Arithmetic 12 10 50 9 0.95   Working Memory Process Score Summary  Process Score Raw Score Scaled Score Percentile Rank Base Rate SEM  Digit Span Forward 8 8 25  -- 1.31  Digit Span Backward 6 8 25  -- 1.34  Digit Span Sequencing 7 10 50 -- 1.12  Longest Digit Span Forward 5 -- -- 95.0 --  Longest Digit Span Backward 4 -- -- 76.0 --  Longest Digit Span Sequence 5 -- -- 68.0 --   Processing Speed Subtests Summary  Subtest Raw Score Scaled Score Percentile Rank Reference Group Scaled Score SEM  Symbol Search 21 10 50 6 1.12  Coding 44 10 50 5 1.12   WMS-IV (Older Adult Battery)  Index Score Summary  Index Sum of Scaled Scores Index Score Percentile Rank 95% Confidence Interval Qualitative Descriptor  Auditory Memory (AMI) 56 124 95 117-129 Superior  Visual Memory (VMI) 20 100 50 95-105 Average  Immediate Memory (IMI) 36 112 79 105-118 High Average  Delayed  Memory (DMI) 40 122 93 113-128 Superior   Primary Subtest Scaled Score Summary  Subtest Domain Raw Score Scaled Score Percentile Rank  Logical Memory I AM 34 11 63  Logical Memory II AM 24 13 84  Verbal [employer] I AM 32 15 95  Verbal [facility] II AM 10 17 99  Visual Reproduction I VM 29 10 50  Visual Reproduction II VM 17 10 50  Symbol Span VWM 18 12 75   Auditory Memory Process Score Summary  Process Score Raw Score Scaled Score Percentile Rank Cumulative Percentage (Base Rate)  LM II Recognition 22 - - >75%  VPA II Recognition 30 - - >75%    Visual Memory Process Score Summary  Process Score Raw Score Scaled Score Percentile Rank Cumulative Percentage (Base Rate)  VR II Recognition 6 - - >75%     ABILITY-MEMORY ANALYSIS  Ability Score:  GAI: 120 Date of Testing:  WAIS-IV; WMS-IV 2021/03/17  Predicted Difference Method   Index Predicted WMS-IV Index Score Actual WMS-IV Index Score Difference Critical Value  Significant Difference Y/N Base Rate  Auditory Memory 111 124 -13 9.16 Y 15-20%  Visual Memory 112 100 12 8.46 Y 15-20%  Immediate Memory 113 112 1 10.38 N   Delayed Memory 112 122 -10 12.01 N   Statistical significance (critical value) at the .01 level.    Feedback to Patient:  Marie Ford will return for an interactive feedback session with Dr. [name] on 04/24/21 at which time her test performances, clinical impressions and treatment recommendations will be reviewed in detail. The patient understands she can contact our office should she require our assistance before this time.   Full report to follow.

## 2021-03-27 ENCOUNTER — Encounter (INDEPENDENT_AMBULATORY_CARE_PROVIDER_SITE_OTHER): Payer: Self-pay | Admitting: Otolaryngology

## 2021-03-27 ENCOUNTER — Ambulatory Visit (INDEPENDENT_AMBULATORY_CARE_PROVIDER_SITE_OTHER): Payer: Medicare PPO | Admitting: Otolaryngology

## 2021-03-27 ENCOUNTER — Other Ambulatory Visit: Payer: Self-pay

## 2021-03-27 VITALS — Temp 94.5°F

## 2021-03-27 DIAGNOSIS — G8929 Other chronic pain: Secondary | ICD-10-CM | POA: Diagnosis not present

## 2021-03-27 DIAGNOSIS — J323 Chronic sphenoidal sinusitis: Secondary | ICD-10-CM

## 2021-03-27 DIAGNOSIS — R519 Headache, unspecified: Secondary | ICD-10-CM | POA: Diagnosis not present

## 2021-03-27 NOTE — Progress Notes (Signed)
HPI: Marie Ford is a 75 y.o. female who returns today for evaluation of headaches and chronic left sphenoid sinus disease.  She is status post previous surgery with a left sphenoidotomy performed in December of last year.  Overall she is doing better but she still gets occasional headaches that come and go.  They seem a little bit worse with the allergies.  She has been using saline rinses daily and this seems to help.  She denies any significant drainage from her nose.  She feels like the headaches are behind her eyes when she has them but they are not constant.  They more tend to come and go.  She has had no fever or illness. She is getting ready to leave for [place] in a month where she spends most of her summer months..  Past Medical History:  Diagnosis Date  . Chronic kidney disease    stage 3   . Chronic sphenoidal sinusitis   . Degenerative arthritis of hip    requirde surgery 2009  . Depression 2010  . Diplopia 2011   in left lateral gaze   . Diplopia    " my eyes dont track together, i have corrective prisms in my eye glasses"   . Diverticulosis 2006   noted in a colonoscopy  . Esophageal reflux 2013  . Hypertension   . IGT (impaired glucose tolerance) 2011   denies hx of diabetes   . Insomnia 2009  . Mixed hyperlipidemia 2000  . Osteoarthritis   . Osteopenia 2005  . Other and unspecified hyperlipidemia 2000  . Other specified disease of sebaceous glands   . Palpitations 2009   secondary to PSVT  . PNA (pneumonia) 2014  . Psoriasis 2004  . PSVT (paroxysmal supraventricular tachycardia) (HCC) 2009   "report it has been some time since i had that "   . Situational stress   . Venous insufficiency since 2017   Past Surgical History:  Procedure Laterality Date  . CATARACT EXTRACTION W/ INTRAOCULAR LENS  IMPLANT, BILATERAL    . COLONOSCOPY  01/30/2010   Jarold Motto  . ETHMOIDECTOMY Left 11/17/2020   Procedure: LEFT SIDED TOTAL ETHMOIDECTOMY;  Surgeon: Drema Halon, MD;  Location: [facility];  Service: ENT;  Laterality: Left;  . EYE SURGERY    . HEMORRHOID SURGERY    . JOINT REPLACEMENT    . LASIK    . left medial and superior rectus muscle recesion for diplopia correction    . SINUS ENDO WITH FUSION Left 11/17/2020   Procedure: SINUS ENDOSCOPY WITH FUSION NAVIGATION;  Surgeon: Drema Halon, MD;  Location: [facility];  Service: ENT;  Laterality: Left;  . SPHENOIDECTOMY Left 11/17/2020   Procedure: LEFT SIDED SPHENOIDECTOMY WITH TISSUE REMOVAL;  Surgeon: Drema Halon, MD;  Location: [facility];  Service: ENT;  Laterality: Left;  . surgery of right eye rectus muscles for diplpia correction   2019  . TOTAL HIP ARTHROPLASTY Right 2010  . TOTAL HIP ARTHROPLASTY Left 08/19/2019   Procedure: TOTAL HIP ARTHROPLASTY ANTERIOR APPROACH;  Surgeon: Ollen Gross, MD;  Location: [facility];  Service: Orthopedics;  Laterality: Left;   . TURBINATE REDUCTION Left 11/17/2020   Procedure: LEFT SIDED TURBINATE REDUCTION;  Surgeon: Drema Halon, MD;  Location:  SURGERY CENTER;  Service: ENT;  Laterality: Left;  . URETHRAL SLING     midurethral sling with TVT Exact and Cystoscopy   Social History   Socioeconomic History  .  Marital status: Single    Spouse name: Not on file  . Number of children: Not on file  . Years of education: Not on file  . Highest education level: Doctorate  Occupational History  . Not on file  Tobacco Use  . Smoking status: Never Smoker  . Smokeless tobacco: Never Used  Vaping Use  . Vaping Use: Never used  Substance and Sexual Activity  . Alcohol use: Yes    Comment: occ wine  . Drug use: No  . Sexual activity: Not Currently    Birth control/protection: Post-menopausal  Other Topics Concern  . Not on file  Social History Narrative   Lives at home alone   Right handed   Caffeine: rarely   Social Determinants of Health   Financial  Resource Strain: Not on file  Food Insecurity: Not on file  Transportation Needs: Not on file  Physical Activity: Not on file  Stress: Not on file  Social Connections: Not on file   Family History  Problem Relation Age of Onset  . Congestive Heart Failure Mother   . CVA Father   . Heart failure Father   . Liver cancer Brother   . Colon cancer Brother 35  . Colitis Brother   . Non-Hodgkin's lymphoma Sister   . Hypertension Brother   . Gout Brother   . Alcohol abuse Sister   . Melanoma Daughter   . Esophageal cancer Neg Hx   . Rectal cancer Neg Hx   . Stomach cancer Neg Hx   . Colon polyps Neg Hx    Allergies  Allergen Reactions  . Erythromycin Shortness Of Breath and Rash  . Macrobid [Nitrofurantoin] Other (See Comments)    Pass out  . Penicillins Rash    Did it involve swelling of the face/tongue/throat, SOB, or low BP? No Did it involve sudden or severe rash/hives, skin peeling, or any reaction on the inside of your mouth or nose? No Did you need to seek medical attention at a hospital or doctor's office? No When did it last happen? More than 10 years ago If all above answers are "NO", may proceed with cephalosporin use.   . Sulfamethoxazole-Trimethoprim Rash   Prior to Admission medications   Medication Sig Start Date End Date Taking? Authorizing Provider  Calcium Carb-Cholecalciferol (CALCIUM CARBONATE-VITAMIN D3) 600-400 MG-UNIT TABS Take 1 tablet by mouth every other day.     [provider]  ezetimibe (ZETIA) 10 MG tablet ezetimibe 10 mg tablet  Take 1 tablet every day by oral route. 02/25/19   [provider]  FLUoxetine (PROZAC) 20 MG capsule  10/10/20   [provider]  Multiple Vitamin (MULTIVITAMIN WITH MINERALS) TABS tablet Take 1 tablet by mouth daily. Women's Multivitamin    [provider]  omeprazole (PRILOSEC) 20 MG capsule Take 20 mg by mouth daily as needed (with meloxicam use).    [provider]  Polyethyl  Glycol-Propyl Glycol (SYSTANE) 0.4-0.3 % SOLN Place 1 drop into both eyes 3 (three) times daily as needed (dry/irritated eyes.).    [provider]  pravastatin (PRAVACHOL) 80 MG tablet Take 80 mg by mouth every evening.     [provider]  triamcinolone (NASACORT) 55 MCG/ACT AERO nasal inhaler Place 2 sprays into the nose daily. Use at night 11/01/20   Drema Halon, MD  triamterene-hydrochlorothiazide (MAXZIDE-25) 37.5-25 MG tablet Take 1 tablet by mouth daily.    [provider]     Positive ROS: Otherwise negative  All other systems have been reviewed and were otherwise negative with the exception of those mentioned in the HPI and as above.  Physical Exam: Constitutional: Alert, well-appearing, no acute distress Ears: External ears without lesions or tenderness. Ear canals are clear bilaterally with intact, clear TMs.  Nasal: External nose without lesions. Septum slightly deviated to the right.  She had no significant crusting in the posterior left nasal cavity where she had previous surgery.  Nasal endoscopy was performed and on nasal endoscopy the left sphenoidotomy appears patent but is very small.  There is no drainage from the sphenoidotomy..  Oral: Lips and gums without lesions. Tongue and palate mucosa without lesions. Posterior oropharynx clear. Neck: No palpable adenopathy or masses Respiratory: Breathing comfortably  Skin: No facial/neck lesions or rash noted.  Procedures  Assessment: History of chronic left sphenoid sinus disease with very thickened sphenoid bone.  She is status post a sphenoidotomy performed in December of last year.  She is doing better but still has occasional intermittent headaches.  Plan: At this point recommended continue use of the saline irrigation if this helps with her symptoms and use of Nasacort 2 sprays each nostril at night as this should help relieve any of the swelling from allergies. If she continues to  have headaches or worsening of her headaches would recommend repeat CT scan of the sinuses when she returns from [place] in the fall.  If the headaches become worse prior to leaving for [place] we can get a CT scan earlier if needed. Depending on her symptoms if she "requires any further surgery I would probably refer her to [facility] as further surgery would be difficult with the very thickened sphenoid bone.   [name], MD

## 2021-04-05 ENCOUNTER — Encounter: Payer: Self-pay | Admitting: Psychology

## 2021-04-05 ENCOUNTER — Other Ambulatory Visit: Payer: Self-pay

## 2021-04-05 ENCOUNTER — Encounter (HOSPITAL_BASED_OUTPATIENT_CLINIC_OR_DEPARTMENT_OTHER): Payer: Medicare PPO | Admitting: Psychology

## 2021-04-05 DIAGNOSIS — G20 Parkinson's disease: Secondary | ICD-10-CM

## 2021-04-05 DIAGNOSIS — G20C Parkinsonism, unspecified: Secondary | ICD-10-CM

## 2021-04-05 DIAGNOSIS — R269 Unspecified abnormalities of gait and mobility: Secondary | ICD-10-CM | POA: Diagnosis not present

## 2021-04-05 DIAGNOSIS — F5102 Adjustment insomnia: Secondary | ICD-10-CM

## 2021-04-05 DIAGNOSIS — R413 Other amnesia: Secondary | ICD-10-CM | POA: Diagnosis not present

## 2021-04-05 NOTE — Progress Notes (Signed)
Neuropsychological Evaluation   Patient:  Marie Ford   DOB: 1946-01-12  MR Number: [id number]  Location: [facility] [address] [place], [address] [id number] [facility] [place] [address] Dept: [phone]  Start: 9 AM End: 10 AM  Provider/Observer:     Hershal Coria PsyD  Chief Complaint:      Chief Complaint  Patient presents with  . Tremors  . Memory Loss  . Other    Word Finding Difficulties    Reason For Service:      Marie Ford is a 75 year old female referred by Marie Dean, MD for neuropsychological evaluation as part of the broader neurological evaluation.  The patient was initially referred to Dr. [name] by the patient's PCP Marie Putt, MD due to the development of significant headaches, ataxia and cognitive impairments.  The patient describes increasing tremors mostly on her left side, imbalance, fatigue, cognitive issues including increasing memory difficulties for learning new information increasing word finding difficulties.  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  The patient describes exacerbation of chronic headaches over the past year.  CT scan of sinuses showed sinusitis, which she also had similar findings 1 year prior.  The patient also has had increasing anxiety and depression and feels like it is related to stress with some improvement on fluoxetine.  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  The patient describes her tremors as predominantly left-sided and reports that they are most noticeable with intention and purposeful movement but reports that sometimes she has them at rest.  She reports that they initially started after her last hip replacement surgery in September 2020.  But reports that she has had "1 surgery after another."  The patient describes her memory difficulties as difficulty learning new information and learning others names.  Patient reports that she stumbles when trying to find words and describes increasing circumlocution but no paraphasic errors.  She reports that often the words she is trying to find will come back later.  She reports that she has to think more about where she is going in her directions and is a much more cautious driver than the past.  The patient reports that her vision has been more of an issue recently and she has had 2 past eye surgeries.  Patient reports that she is having more fatigue and feels more jittery when she is stressed.  The patient describes some degree of insomnia and that she has usually gotten 6 to 8 hours of sleep each night but there is been more disturbance in sleep.  She reports that her handwriting has displayed some changes in that she describes it is more of micrographia type changes.  She reports that she has had an decreased appetite over the past 2 years.  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  The patient reports that she has been unable to do this  activity at all due to disturbance in gait and balance issues.  The patient reports that she is more fatigued and weak on top of her balance disturbance.  The patient had an MRI conducted on 09/30/2020.  The impression was of no acute abnormality and only mild chronic  microvascular ischemic changes that were typical for age.  Addition to note 02/17/2021: The patient had a DaTscan performed on 02/09/2021 which I have added to her note here.  The impression of this suggested marked decrease in activity within the left and right striata that was slightly greater deficits on right.  Is noted that heads of the caudate nuclei showed significant decrease in activity, which was barely above background activity with decreased activity appearing slightly greater on right versus left.  While this pattern is reported as being found in parkinsonian type pathology a DaTscan is not diagnostic in nature but more of an adjunctive test to aid in overall clinical diagnosis.  Neuropsychology Note  YARDEN MANUELITO completed 240 minutes of neuropsychological testing with this provider. Vision and hearing were adequate for the purposes of testing. She had no trouble understanding test instructions. Motivation and effort were excellent and she persisted well. The patient did not appear overtly distressed by the testing session, per behavioral observation or via self-report. Rest breaks were offered.   Clinical Decision Making: In considering the patient's current level of functioning, level of presumed impairment, nature of symptoms, emotional and behavioral responses during the interview, level of literacy, and observed level of motivation/effort, a battery of tests was selected. Changes were made as deemed necessary based on patient performance on testing, behavioral observations and additional pertinent factors such as those listed above.  Tests Administered:   [employer] (BNT)  Controlled Oral Word Association Test (COWAT; FAS & Animals)   Finger Tapping Test (FTT)  Grooved Pegboard     Hand Dynamometer    Trail Making Test (TMT; Part A & B)  Wechsler Adult Intelligence Scale, 4th Edition (WAIS-IV)   Wechsler Memory Scale, 4th Edition (WMS-IV): Older Adult Battery    Results: (to be included once scored)                                                  Raw                 T-Score           Percentile       Description  BNT             Total                            58/60               61                    86                    Above Average  COWAT             FAS:                            42  49                    46                    Average                         Repetition:       4                      ---                     ---                     WNL                         Intrusion:         5                      ---                     ---                     Below Expectation              Animals                       20                    49                    46                    Average                         Repetition:       0                         Intrusion:         0 FTT             Dominant (Rt.):           40 taps            48                    42                    Average             Non-Dominant:            20 taps            22                    <1                    Exceptionally Low   Grooved Pegboard             Dominant (Rt.):           96"  41                    18                    Below Average             Non-Dominant:            D/C                  <20                  <1                    Exceptionally Low  Hand Dynamometer               Dominant (Rt.):           14 Kg               ---                     26-51               Average             Non-Dominant:            15 Kg.              ---                     26-51               Average  TMT             Part A                          36"                   48                    42                    Average             Part B                          98"                   45                    31                    Average             WAIS-IV          Composite Score Summary   Scale Sum  of Scaled Scores Composite Score Percentile Rank 95% Conf. Interval Qualitative Description  Verbal Comprehension 43 VCI 125 95 118-130 Superior  Perceptual Reasoning 35 PRI 109 73 102-115 Average  Working Memory 19 WMI 97 42 90-104 Average  Processing Speed 20 PSI 100 50 92-108 Average  Full Scale 117 FSIQ 111 77 107-115 High Average  General Ability 78 GAI 120 91 114-124 Superior          Verbal Comprehension Subtests Summary   Subtest Raw Score Scaled Score Percentile  Rank Reference Group Scaled Score SEM  Similarities 27 12 75 11 1.12  Vocabulary 54 16 98 17 0.73  Information 23 15 95 16 0.73          Perceptual Reasoning Subtests Summary   Subtest Raw Score Scaled Score Percentile Rank Reference Group Scaled Score SEM  Block Design 28 10 50 6 1.27  Matrix Reasoning 17 14 91 9 0.73  Visual Puzzles 11 11 63 7 0.99          Working Scientist, research (life sciences) Raw Score Scaled Score Percentile Rank Reference Group Scaled Score SEM  Digit Span 21 9 37 6 0.79  Arithmetic 12 10 50 9 0.95           Processing Speed Subtests Summary   Subtest Raw Score Scaled Score Percentile Rank Reference Group Scaled Score SEM  Symbol Search 21 10 50 6 1.12  Coding 44 10 50 5 1.12   WMS-IV (Older Adult Battery)         Index Score Summary   Index Sum of Scaled Scores Index Score Percentile Rank 95% Confidence Interval Qualitative Descriptor  Auditory Memory (AMI) 56 124 95 117-129 Superior  Visual Memory (VMI) 20 100 50 95-105 Average  Immediate Memory (IMI) 36 112 79 105-118 High Average  Delayed Memory (DMI) 40 122 93 113-128 Superior         Primary Subtest Scaled Score Summary   Subtest Domain Raw Score Scaled Score Percentile Rank  Logical Memory I AM 34 11 63  Logical Memory II AM 24 13 84  Verbal Paired Associates I AM 32 15 95  Verbal Paired Associates II AM 10 17 99  Visual Reproduction I VM 29 10 50  Visual Reproduction II VM 17 10 50  Symbol Span VWM  18 12 75         Auditory Memory Process Score Summary   Process Score Raw Score Scaled Score Percentile Rank Cumulative Percentage (Base Rate)  LM II Recognition 22 - - >75%  VPA II Recognition 30 - - >75%          Visual Memory Process Score Summary   Process Score Raw Score Scaled Score Percentile Rank Cumulative Percentage (Base Rate)  VR II Recognition 6 - - >75%     ABILITY-MEMORY ANALYSIS  Ability Score:    GAI: 120 Date of Testing:           WAIS-IV; WMS-IV 2021/03/17           Predicted Difference Method   Index Predicted WMS-IV Index Score Actual WMS-IV Index Score Difference Critical Value  Significant Difference Y/N Base Rate  Auditory Memory 111 124 -13 9.16 Y 15-20%  Visual Memory 112 100 12 8.46 Y 15-20%  Immediate Memory 113 112 1 10.38 N   Delayed Memory 112 122 -10 12.01 N   Statistical significance (critical value) at the .01 level.      Test Results:   Initially, an estimation was made as to the patient's historical/premorbid intellectual and cognitive functioning looking at various psychosocial, educational and occupational history variables.  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  It is estimated that the patient has performed in the superior range relative to a normative population and numerous intellectual and cognitive areas and would likely have been performing  in the superior range particularly with verbal base skills and memory and learning variables.  The patient's expressive language and verbal fluency's were assessed utilizing both verbal fluency measures around word production and naming abilities.  The patient performed within normal limits on measures of verbal fluency and naming ability with the exception of some difficulty inhibiting nontarget words and  other intrusions but was in the average range with regard to verbal fluency relative to a normative population.  This may represent some slight decrease as the patient likely has always done quite well and expressive language capacity.  The patient performed in the above average range relative to targeted/cued naming variables.  There was no stronger significant deficits noted in these areas although subjective reports of more word finding difficulties likely represent a relative loss of capacity from her superior range and she is now functioning generally in the average range relative to a normative population.  We administered the Wechsler Adult Intelligence Scale to provide an objective assessment of a wide range of various cognitive domains.  From a global perspective the patient produced a full-scale IQ score of 111 which falls at the 77th percentile and is in the high average range relative to a normative population.  However, it is predicted that she would have performed in the superior range so this does suggest some relative loss for the patient compared to her historical functioning.  We also calculated the patient's general abilities index score which places less emphasis on working memory/encoding abilities and information processing speed as these are variables that are the most sensitive to acute changes in functioning.  The patient produced a general abilities index score of 120 which falls at the 91st percentile and is in the superior range.  This global variable comparison strongly suggest that the patient is experiencing some relative loss in auditory encoding variables in information processing speed even though her current performance falls in the average range relative to a normative population.  The patient produced a verbal comprehension index score of 125 which falls at the 95th percentile and is in the superior range.  Individual subtest all were quite good with particular strengths and  for vocabulary skills and general fund of information.  This performance is consistent with predictions of historical/premorbid functioning and suggest that her verbal/language skills still are quite intact although there may be some relative weakness with regard to verbal reasoning and problem-solving capacity as that was only in the upper end of the average range but remained quite good.  The patient produced a perceptual reasoning index score of 109 which falls at the 73rd percentile and is in the average range.  Individual subtest were in the average range relative to a normative population with the exception of her visual reasoning and problem-solving abilities which were in the superior range relative to a normative population.  Visual analysis and organization and visual estimation and judgment skills were in the average range which suggest some potential relative loss and functioning but still her performance is not indicative of severe impairment.  We also administered the Trail Making Test part a and B to assess visual scanning and searching abilities, speed of information processing as well as cognitive shifting.  Her performance on both part a and part B were in the average range.  Again, these do not suggest significant deficits relative to a normative population but are likely below her previous levels of functioning.  The patient produced a working memory index score of 97 which falls  at the 42nd percentile and is in the average range.  There was consistency with regard to subtest performance in this domain.  While these performances were in the average range she likely has always performed in the superior range in this capacity and this does suggest a relative loss in functioning but still functioning in the average range relative to a normative population.  The patient produced a processing speed index score of 100 which falls at the 50th percentile and is in the average range.  There was also  strong consistency between subtest performance suggesting average performance with regard to visual scanning and visual searching and overall speed of mental operations.  Again, while this does not suggest severe impairment and is within normative expectations it is likely below historical/premorbid functioning and does suggest a relative loss and capacity in these areas.  The patient was administered the Wechsler Memory Scale-IV to provide a thorough assessment of a broad range of memory capacity both visual and auditory in nature.  The patient performed in the average range on auditory encoding measures on the Wechsler Adult Intelligence Scale and she also performed in the upper end of the average range for visual encoding capacity on the Wechsler Memory Scale's.  I do suspect that these are below her historical range of function in these areas as she is always been academically gifted and successfully completed a number of high demand high expectation jobs throughout her career.  This level of encoding capacity does suggest that it should not have a major impact on her ability to learn and store new information although it may reduce her likely excellent historical capacity.  Breaking the patient's memory functions down between auditory versus visual memory the patient produced an auditory memory index score of 124 which falls at the 95th percentile and is in the superior range.  And likely does not represent any significant loss in her auditory learning and memory.  In sharp contrast as the patient's visual memory capacity where she produced a visual memory index score of 100 which fell at the 50th percentile and in the average range.  While this does not indicate severe or profound deficits it is likely a significant loss of reduction in visual memory capacity relative to her lifelong functioning.  We also broke down the patient's immediate versus delayed memory.  The patient produced an immediate memory  index score of 112 which falls in the high average range and is at the 79th percentile relative to a normative population.  While this is likely below historical function is almost assuredly completely explained by her relative visual memory capacity loss.  The patient produced a delayed memory index score of 122 which falls at the 93rd percentile and is in superior range.  Combination of these memory variables strongly suggest that whenever information is adequately encoded and initially stored it remains available for later recall as indicated by her exceptional delayed memory functioning and exceptional recognition memory capacity.  We also calculated the patient's ability-memory analysis utilizing the patient's general abilities index score from the Wechsler Adult Intelligence Scale to produce a predicted score on various memory and learning indices and then compared this predicted score to her actual achieved performance on these indices.  On the patient's auditory memory and learning she exceeded the predicted score which highlights retention and maintain capacity with regard to auditory memory and learning.  The patient was 12 points below predicted levels on visual memory which highlights the relative loss and visual memory  and learning capacity.  She performed exceptionally well on delayed memory components and consistent on immediate memory components.  Impression/Diagnosis:   Overall, the objective assessment of a wide range of cognitive functioning and memory capacity do suggest some relative loss compared to her predicted level of functioning in a number of different areas.  However, all of her test performances performed at least in the average range and there was no area of significant deficits noted.  With regard to expressive language capacity she did show some relative loss particularly with inhibiting intrusive words and free recall format and likely some word finding and production reductions  relative to her historical functioning.  There were no significant or severe expressive language deficits noted.  For fine motor performance and motor speed the patient showed deficits for her left nondominant hand for both motor speed and fine motor control that was clear and is delineation between her average performance with regard to her right dominant hand production.  This pattern did suggest some degree of lateralization with right hemisphere greater than left hemisphere deficits and motor functioning difficulties.  This also correlates with both the patient's subjective reports of greater left-sided deficits and also correlates with DaTscan findings of greater abnormalities within the right hemisphere functioning versus left hemisphere functioning.  Across the board the patient did very well on verbal capacity and verbal comprehension abilities but showed greater relative weaknesses with regard to visual scanning and visual searching capacity, information processing speed, verbal reasoning capacity and visual analysis and organization capacity as well as visual learning and memory.  The patient's auditory memory was quite good and while visual memory and learning fell in the average range relative to a normative population it was significantly below where we would predict her to be historically.  Subjective symptoms correlate quite well with these objective findings of significant changes in motor functioning of her tremors being greater on the left side versus right side and objective medical imaging findings of greater functional deficits and right hemisphere versus left hemisphere.  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  At this point given the fact that none of her cognitive variables fell below the average range I do suspect that the majority of her difficulties neurologically involve motor regions at the brain  and the effects it is having on tremors and fine motor control.  There is also a greater left hemisphere versus right hemisphere relative loss in a number of cognitive functioning areas although there were no severe deficits noted throughout this testing.  The patient has had a significant loss in function due to changes in motor functioning and increases in difficulties with sleep patterns.  Resulting adjustment responses including depression and anxiety types of features are present likely exacerbating some of her memory changes.  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  I will sit down with the patient and go over the results of this neuropsychological evaluation as well as having these findings made available to her referring neurologist Dr. [name].  We will also sit down to talk about coping  strategies around dealing with what appears likely to be a progressive condition.  It is likely that there will be further decline with regard to her cognitive functioning and visual memory and visual processing are also the areas that are likely to be most impacted at least initially.  Diagnosis:    Parkinsonian tremor (HCC)  Abnormality of gait  Adjustment insomnia   _____________________ Arley Phenix, Psy.D. Clinical Neuropsychologist

## 2021-04-19 ENCOUNTER — Other Ambulatory Visit: Payer: Self-pay | Admitting: Family Medicine

## 2021-04-19 ENCOUNTER — Other Ambulatory Visit (HOSPITAL_COMMUNITY): Payer: Self-pay | Admitting: Family Medicine

## 2021-04-19 DIAGNOSIS — R0989 Other specified symptoms and signs involving the circulatory and respiratory systems: Secondary | ICD-10-CM

## 2021-04-19 DIAGNOSIS — Z1231 Encounter for screening mammogram for malignant neoplasm of breast: Secondary | ICD-10-CM

## 2021-04-19 DIAGNOSIS — E785 Hyperlipidemia, unspecified: Secondary | ICD-10-CM

## 2021-04-20 ENCOUNTER — Ambulatory Visit (INDEPENDENT_AMBULATORY_CARE_PROVIDER_SITE_OTHER): Payer: Medicare PPO | Admitting: Otolaryngology

## 2021-04-20 ENCOUNTER — Ambulatory Visit (HOSPITAL_COMMUNITY)
Admission: RE | Admit: 2021-04-20 | Discharge: 2021-04-20 | Disposition: A | Discharge status: Home / Self Care | Payer: Medicare PPO | Source: Ambulatory Visit | Attending: Family Medicine | Admitting: Family Medicine

## 2021-04-20 ENCOUNTER — Other Ambulatory Visit: Payer: Self-pay

## 2021-04-20 DIAGNOSIS — R0989 Other specified symptoms and signs involving the circulatory and respiratory systems: Secondary | ICD-10-CM | POA: Diagnosis not present

## 2021-04-24 ENCOUNTER — Encounter: Payer: Medicare PPO | Attending: Psychology | Admitting: Psychology

## 2021-04-24 ENCOUNTER — Other Ambulatory Visit: Payer: Self-pay

## 2021-04-24 DIAGNOSIS — R269 Unspecified abnormalities of gait and mobility: Secondary | ICD-10-CM | POA: Insufficient documentation

## 2021-04-24 DIAGNOSIS — G20 Parkinson's disease: Secondary | ICD-10-CM | POA: Insufficient documentation

## 2021-04-24 DIAGNOSIS — R413 Other amnesia: Secondary | ICD-10-CM | POA: Diagnosis not present

## 2021-04-26 ENCOUNTER — Encounter: Payer: Self-pay | Admitting: Psychology

## 2021-04-26 NOTE — Progress Notes (Signed)
04/24/2021, 4 PM-5 PM:  Today's visit was an in person visit that was conducted in my outpatient clinic office.  Today we reviewed the results of the recent neuropsychological evaluation and went over treatment recommendations etc.  The patient was present along with her sister to go over the results.  Below I will provide a copy of the impression/summary from the formal neuropsychological evaluation that can be found in its entirety in the patient's EMR dated 04/05/2021 with initial clinical background information being found in her EMR dated 01/18/2021.  We reviewed the results and talked about planning and they had a number of structured questions to ask and these questions reviewed and answered the best as I possibly could regarding specific concerns around expected progression and recommendations for adjusting to and adapting to the likely diagnostic features.    Impression/Diagnosis:                     Overall, the objective assessment of a wide range of cognitive functioning and memory capacity do suggest some relative loss compared to her predicted level of functioning in a number of different areas.  However, all of her test performances performed at least in the average range and there was no area of significant deficits noted.  With regard to expressive language capacity she did show some relative loss particularly with inhibiting intrusive words and free recall format and likely some word finding and production reductions relative to her historical functioning.  There were no significant or severe expressive language deficits noted.  For fine motor performance and motor speed the patient showed deficits for her left nondominant hand for both motor speed and fine motor control that was clear and is delineation between her average performance with regard to her right dominant hand production.  This pattern did suggest some degree of lateralization with right hemisphere greater than left hemisphere  deficits and motor functioning difficulties.  This also correlates with both the patient's subjective reports of greater left-sided deficits and also correlates with DaTscan findings of greater abnormalities within the right hemisphere functioning versus left hemisphere functioning.  Across the board the patient did very well on verbal capacity and verbal comprehension abilities but showed greater relative weaknesses with regard to visual scanning and visual searching capacity, information processing speed, verbal reasoning capacity and visual analysis and organization capacity as well as visual learning and memory.  The patient's auditory memory was quite good and while visual memory and learning fell in the average range relative to a normative population it was significantly below where we would predict her to be historically.  Subjective symptoms correlate quite well with these objective findings of significant changes in motor functioning of her tremors being greater on the left side versus right side and objective medical imaging findings of greater functional deficits and right hemisphere versus left hemisphere.  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  At this point given the fact that none of her cognitive variables fell below the average range I do suspect that the majority of her difficulties neurologically involve motor regions at the brain and the effects it is having on tremors and fine motor control.  There is also a greater left hemisphere versus right hemisphere relative loss in a number of cognitive functioning areas although there were no severe deficits noted throughout this testing.  The patient has had a significant loss in function due to changes in  motor functioning and increases in difficulties with sleep patterns.  Resulting adjustment responses including depression and anxiety types of features are  present likely exacerbating some of her memory changes.  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  I will sit down with the patient and go over the results of this neuropsychological evaluation as well as having these findings made available to her referring neurologist Dr. [name].  We will also sit down to talk about coping strategies around dealing with what appears likely to be a progressive condition.  It is likely that there will be further decline with regard to her cognitive functioning and visual memory and visual processing are also the areas that are likely to be most impacted at least initially.  Diagnosis:                               Parkinsonian tremor (HCC)  Abnormality of gait  Adjustment insomnia   _____________________ Arley Phenix, Psy.D. Clinical Neuropsychologist

## 2021-04-27 ENCOUNTER — Ambulatory Visit (HOSPITAL_COMMUNITY)
Admission: RE | Admit: 2021-04-27 | Discharge: 2021-04-27 | Disposition: A | Discharge status: Home / Self Care | Payer: Medicare PPO | Source: Ambulatory Visit | Attending: Family Medicine | Admitting: Family Medicine

## 2021-04-27 ENCOUNTER — Other Ambulatory Visit: Payer: Self-pay

## 2021-04-27 DIAGNOSIS — E785 Hyperlipidemia, unspecified: Secondary | ICD-10-CM

## 2021-05-01 ENCOUNTER — Ambulatory Visit (INDEPENDENT_AMBULATORY_CARE_PROVIDER_SITE_OTHER): Payer: Medicare PPO | Admitting: Otolaryngology

## 2021-05-01 ENCOUNTER — Other Ambulatory Visit: Payer: Self-pay

## 2021-05-01 DIAGNOSIS — J323 Chronic sphenoidal sinusitis: Secondary | ICD-10-CM | POA: Diagnosis not present

## 2021-05-01 DIAGNOSIS — J310 Chronic rhinitis: Secondary | ICD-10-CM | POA: Diagnosis not present

## 2021-05-01 NOTE — Progress Notes (Signed)
HPI: Marie Ford is a 75 y.o. female who returns today for evaluation of recurrent headache.  She is also has some congestion in her ears and feels like she has fluid in the right ear.  She has had some popping in her ears..  She is status post a left sphenoidotomy performed in December of last year.  Past Medical History:  Diagnosis Date  . Chronic kidney disease    stage 3   . Chronic sphenoidal sinusitis   . Degenerative arthritis of hip    requirde surgery 2009  . Depression 2010  . Diplopia 2011   in left lateral gaze   . Diplopia    " my eyes dont track together, i have corrective prisms in my eye glasses"   . Diverticulosis 2006   noted in a colonoscopy  . Esophageal reflux 2013  . Hypertension   . IGT (impaired glucose tolerance) 2011   denies hx of diabetes   . Insomnia 2009  . Mixed hyperlipidemia 2000  . Osteoarthritis   . Osteopenia 2005  . Other and unspecified hyperlipidemia 2000  . Other specified disease of sebaceous glands   . Palpitations 2009   secondary to PSVT  . PNA (pneumonia) 2014  . Psoriasis 2004  . PSVT (paroxysmal supraventricular tachycardia) (HCC) 2009   "report it has been some time since i had that "   . Situational stress   . Venous insufficiency since 2017   Past Surgical History:  Procedure Laterality Date  . CATARACT EXTRACTION W/ INTRAOCULAR LENS  IMPLANT, BILATERAL    . COLONOSCOPY  01/30/2010   Jarold Motto  . ETHMOIDECTOMY Left 11/17/2020   Procedure: LEFT SIDED TOTAL ETHMOIDECTOMY;  Surgeon: Drema Halon, MD;  Location: [facility];  Service: ENT;  Laterality: Left;  . EYE SURGERY    . HEMORRHOID SURGERY    . JOINT REPLACEMENT    . LASIK    . left medial and superior rectus muscle recesion for diplopia correction    . SINUS ENDO WITH FUSION Left 11/17/2020   Procedure: SINUS ENDOSCOPY WITH FUSION NAVIGATION;  Surgeon: Drema Halon, MD;  Location: [facility];  Service: ENT;   Laterality: Left;  . SPHENOIDECTOMY Left 11/17/2020   Procedure: LEFT SIDED SPHENOIDECTOMY WITH TISSUE REMOVAL;  Surgeon: Drema Halon, MD;  Location: [facility];  Service: ENT;  Laterality: Left;  . surgery of right eye rectus muscles for diplpia correction   2019  . TOTAL HIP ARTHROPLASTY Right 2010  . TOTAL HIP ARTHROPLASTY Left 08/19/2019   Procedure: TOTAL HIP ARTHROPLASTY ANTERIOR APPROACH;  Surgeon: Ollen Gross, MD;  Location: [facility];  Service: Orthopedics;  Laterality: Left;   . TURBINATE REDUCTION Left 11/17/2020   Procedure: LEFT SIDED TURBINATE REDUCTION;  Surgeon: Drema Halon, MD;  Location: [facility];  Service: ENT;  Laterality: Left;  . URETHRAL SLING     midurethral sling with TVT Exact and Cystoscopy   Social History   Socioeconomic History  . Marital status: Single    Spouse name: Not on file  . Number of children: Not on file  . Years of education: Not on file  . Highest education level: Doctorate  Occupational History  . Not on file  Tobacco Use  . Smoking status: Never Smoker  . Smokeless tobacco: Never Used  Vaping Use  . Vaping Use: Never used  Substance and Sexual Activity  . Alcohol use: Yes    Comment:  occ wine  . Drug use: No  . Sexual activity: Not Currently    Birth control/protection: Post-menopausal  Other Topics Concern  . Not on file  Social History Narrative   Lives at home alone   Right handed   Caffeine: rarely   Social Determinants of Health   Financial Resource Strain: Not on file  Food Insecurity: Not on file  Transportation Needs: Not on file  Physical Activity: Not on file  Stress: Not on file  Social Connections: Not on file   Family History  Problem Relation Age of Onset  . Congestive Heart Failure Mother   . CVA Father   . Heart failure Father   . Liver cancer Brother   . Colon cancer Brother 54  . Colitis Brother   . Non-Hodgkin's lymphoma Sister   .  Hypertension Brother   . Gout Brother   . Alcohol abuse Sister   . Melanoma Daughter   . Esophageal cancer Neg Hx   . Rectal cancer Neg Hx   . Stomach cancer Neg Hx   . Colon polyps Neg Hx    Allergies  Allergen Reactions  . Erythromycin Shortness Of Breath and Rash  . Macrobid [Nitrofurantoin] Other (See Comments)    Pass out  . Penicillins Rash    Did it involve swelling of the face/tongue/throat, SOB, or low BP? No Did it involve sudden or severe rash/hives, skin peeling, or any reaction on the inside of your mouth or nose? No Did you need to seek medical attention at a hospital or doctor's office? No When did it last happen? More than 10 years ago If all above answers are "NO", may proceed with cephalosporin use.   . Sulfamethoxazole-Trimethoprim Rash   Prior to Admission medications   Medication Sig Start Date End Date Taking? Authorizing Provider  Calcium Carb-Cholecalciferol (CALCIUM CARBONATE-VITAMIN D3) 600-400 MG-UNIT TABS Take 1 tablet by mouth every other day.     [provider]  ezetimibe (ZETIA) 10 MG tablet ezetimibe 10 mg tablet  Take 1 tablet every day by oral route. 02/25/19   [provider]  FLUoxetine (PROZAC) 20 MG capsule  10/10/20   [provider]  Multiple Vitamin (MULTIVITAMIN WITH MINERALS) TABS tablet Take 1 tablet by mouth daily. Women's Multivitamin    [provider]  omeprazole (PRILOSEC) 20 MG capsule Take 20 mg by mouth daily as needed (with meloxicam use).    [provider]  Polyethyl Glycol-Propyl Glycol (SYSTANE) 0.4-0.3 % SOLN Place 1 drop into both eyes 3 (three) times daily as needed (dry/irritated eyes.).    [provider]  pravastatin (PRAVACHOL) 80 MG tablet Take 80 mg by mouth every evening.     [provider]  triamcinolone (NASACORT) 55 MCG/ACT AERO nasal inhaler Place 2 sprays into the nose daily. Use at night 11/01/20   Drema Halon, MD   triamterene-hydrochlorothiazide (MAXZIDE-25) 37.5-25 MG tablet Take 1 tablet by mouth daily.    [provider]     Positive ROS: Otherwise negative  All other systems have been reviewed and were otherwise negative with the exception of those mentioned in the HPI and as above.  Physical Exam: Constitutional: Alert, well-appearing, no acute distress Ears: External ears without lesions or tenderness. Ear canals are clear bilaterally.  TMs are clear bilaterally with no middle ear effusion noted.  On tuning fork testing she heard about the same in both ears with AC > BC bilaterally. Nasal: External nose without lesions. Septum slightly  deviated to the right.  Both middle meatus regions were clear no mucopurulent discharge noted.  On nasal endoscopy the nasopharynx was clear and both eustachian tube areas were unobstructed.  On evaluation of the left sphenoidotomy surgery this appears to be patent although small with no active drainage noted.. Oral: Lips and gums without lesions. Tongue and palate mucosa without lesions. Posterior oropharynx clear. Neck: No palpable adenopathy or masses Respiratory: Breathing comfortably  Skin: No facial/neck lesions or rash noted.  Procedures  Assessment: Questionable sinusitis.  I suspect she still has some chronic left sphenoid sinus disease.  Plan: Recommended regular use of her nasal steroid spray Nasacort which she is presently using. Also prescribed Ceftin 250 mg twice daily for 10 days. If she continues to have headaches briefly discussed with her concerning possible repeat CT scan.  However if further surgery is needed I would recommend referral to [facility].   [name], MD

## 2021-05-03 ENCOUNTER — Encounter: Payer: Self-pay | Admitting: Neurology

## 2021-05-03 ENCOUNTER — Ambulatory Visit: Payer: Medicare PPO | Admitting: Neurology

## 2021-05-03 VITALS — BP 134/82 | HR 82 | Ht 66.5 in | Wt 160.0 lb

## 2021-05-03 DIAGNOSIS — G20 Parkinson's disease: Secondary | ICD-10-CM | POA: Diagnosis not present

## 2021-05-03 MED ORDER — CARBIDOPA-LEVODOPA 25-100 MG PO TABS: ORAL_TABLET | ORAL | 3 refills | Status: DC

## 2021-05-03 NOTE — Progress Notes (Signed)
[name]    Provider:  Dr Lucia GaskinsAhern Requesting Provider: Mosetta PuttBlomgren, Peter, MD Primary Care Provider:  Mosetta PuttBlomgren, Peter, MD  CC:  Headache, memory loss, gait anormality  Interval history 05/03/2021: DAT scan consistent with parkinsonian disorder. We discussed today. I [facility]. They are the leaders in parkinson's Disease in this area.   She has tremor in her left hand at rest, worse with stress, she has some cognitive impairments and weak, she feels she can;t find the words and can't understand directions. We discussed speech therapy, they are concerned about driving and he has a weakness in visual memory, we discussed a driving test, discussed regular skin tests with a dermatologist.     DAT Scan 02/09/2021: Marked decreased activity within the LEFT and RIGHT striatum. Slightly greater deficit on the RIGHT. This pattern can be found in Parkinsonian syndromes pathology.  HPI:  Gwyndolyn SaxonLinda C Callender is a 75 y.o. female here as requested by Mosetta PuttBlomgren, Peter, MD for "headaches, ataxia and cognitive impairment".  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  I reviewed Dr. Geoffery LyonsBlomgren's notes: Over the last year she has had chronic symptoms of headaches, CT scan of the sinuses in April showed sphenoid sinusitis, she had similar findings the year before, she saw Dr. Ezzard StandingNewman in ENT in April of this year and he did not feel the sinusitis required surgery was necessarily the cause  of her headaches.  Associated with her headaches have been increasing symptoms of anxiety and depression, much of this related to stress, recently improved on fluoxetine, however the headaches fatigue and nausea have continued.  She also complains of ataxia over the last few months a few times a week as well as cognitive impairment short-term memory deficits and she is interested in more formal neuropsychological testing.  MRI of the brain in October was unremarkable but also showed the sphenoid sinusitis.   She is here with her friend of 35 years, headaches are improved since surgery and antibiotic. Over the last 2 years she has had cognitive decline and physicial decline and lots of stress, hip replace,ent, 18-year relationship ended, she had to sell a house sh eloved and some communication problems with daughter, headaches are not so much the issue anymore. She has tremors under stress and feels "jittery". The left side doesn't work as well on the left side, she has imbalance on the left side, grip is weakness on the left side, hand writing has changed has gotten smaller. She feels clumsy and slow, her friend provides much information and says she is having difficulty with fly fishing and she is a well known for her fly fishing, her gait is different and she is weak, chronic fatigue, smell and taste is pretty much intact but not great she can't tell the difference between teas, some of the scent is gone, swallowing is ok, tremor is resting and with action, worse when under stress, left is worse. Friend has noticed her memory is declining more than she thinks is normal, she is not sleeping well at all, trouble falling asleep. She tries to meditate, she may  try to eat something, she takes Xanax sometimes. Her gait is different, her left side is affected, hard to move that side and she doesn't swing her left arm. She walks her dog every day. Difficulty with the left hand with coordination, no falls, feels like  she is shuffling, not lightheaded, she feels unbalanced, difficulty tieing the flys for fly fishing. Father died of stroke, no parkinson's disease. She is having difficulty remembering to do things, she loses steps, she has compensation techniques and uses her calendars, not getting lost, no hallucinations or delusions, no acting out dreams, but she is having vivid dreams, more dreaming, much more emotional and depressed but not overreacting. Friend states he looks sadder more flat in affect.   Reviewed notes, labs and imaging from outside physicians, which showed:   October 07, 2020: CMP with BUN 24, creatinine 1.12, glucose 101, otherwise unremarkable, TSH 2.59, sed rate 17 normal, CBC normal.  CT Sinuses 11/10/2020: reviewed report and personally reviewed images (also reviewed with patient) FINDINGS: Paranasal sinuses:  Frontal: Aplastic on the left and hypoplastic on the right  Ethmoid: Frothy secretions in the posterior left ethmoid air cell.  Maxillary: Small presumed retention cysts on the right more than left.  Sphenoid: Chronic left sphenoid sinusitis with complete opacification and sclerotic wall thickening. Sclerotic wall thickening has progressed and the ostium appears covered by bone currently.  Right ostiomeatal unit: Patent.  Left ostiomeatal unit: Patent.  Nasal passages: Patent. Intact nasal septum is essentially midline.  Anatomy: No pneumatization superior to anterior ethmoid notches. Sellar sphenoid pneumatization pattern. No dehiscence of carotid or optic canals. No onodi cell.  Other: No incidental finding on soft tissue windows.  IMPRESSION: 1. Chronic left sphenoid sinusitis with progressive sclerotic wall thickening since May 2021. The ostium now appears covered by calcification. 2. Continued frothy secretions in the left posterior ethmoid air Cell.  MRI brain 09/30/2020: Mild  chronic microvascular ischemic changes, typical for  age.(personally reviewed and agree with findings) reviewed report and personally reviewed images (also reviewed with patient)  Review of Systems: Patient complains of symptoms per HPI as well as the following symptoms: depression, fatigue. Pertinent negatives and positives per HPI. All others negative.   Social History   Socioeconomic History  . Marital status: Single    Spouse name: Not on file  . Number of children: Not on file  . Years of education: Not on file  . Highest education level: Doctorate  Occupational History  . Not on file  Tobacco Use  . Smoking status: Never Smoker  . Smokeless tobacco: Never Used  Vaping Use  . Vaping Use: Never used  Substance and Sexual Activity  . Alcohol use: Yes    Comment: occ wine  . Drug use: No  . Sexual activity: Not Currently    Birth control/protection: Post-menopausal  Other Topics Concern  . Not on file  Social History Narrative   Lives at home alone   Right handed   Caffeine: rarely   Social Determinants of Health   Financial Resource Strain: Not on file  Food Insecurity: Not on file  Transportation Needs: Not on file  Physical Activity: Not on file  Stress: Not on file  Social Connections: Not on file  Intimate Partner Violence: Not on file    Family History  Problem Relation Age of Onset  . Congestive Heart Failure Mother   . CVA Father   . Heart failure Father   . Liver cancer Brother   . Colon  cancer Brother 10  . Colitis Brother   . Non-Hodgkin's lymphoma Sister   . Hypertension Brother   . Gout Brother   . Alcohol abuse Sister   . Melanoma Daughter   . Esophageal cancer Neg Hx   . Rectal cancer Neg Hx   . Stomach cancer Neg Hx   . Colon polyps Neg Hx     Past Medical History:  Diagnosis Date  . Chronic kidney disease    stage 3   . Chronic sphenoidal sinusitis   . Degenerative arthritis of hip    requirde surgery 2009  . Depression 2010  . Diplopia 2011   in left lateral gaze   . Diplopia     " my eyes dont track together, i have corrective prisms in my eye glasses"   . Diverticulosis 2006   noted in a colonoscopy  . Esophageal reflux 2013  . Hypertension   . IGT (impaired glucose tolerance) 2011   denies hx of diabetes   . Insomnia 2009  . Mixed hyperlipidemia 2000  . Osteoarthritis   . Osteopenia 2005  . Other and unspecified hyperlipidemia 2000  . Other specified disease of sebaceous glands   . Palpitations 2009   secondary to PSVT  . PNA (pneumonia) 2014  . Psoriasis 2004  . PSVT (paroxysmal supraventricular tachycardia) (HCC) 2009   "report it has been some time since i had that "   . Situational stress   . Venous insufficiency since 2017    Patient Active Problem List   Diagnosis Date Noted  . OA (osteoarthritis) of hip 08/19/2019  . 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 Diverticular disease 07/10/2017  . Gastroesophageal reflux disease 07/10/2017  . Osteopenia 07/10/2017  . Insomnia 06/03/2016  . Essential hypertension 03/26/2014  . Hyperlipidemia 03/01/2014  . Other specified postprocedural states 03/01/2014  . Exertional dyspnea 02/07/2014  . 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 Diplopia 12/15/2013  . 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    Past Surgical History:  Procedure Laterality Date  . CATARACT EXTRACTION W/ INTRAOCULAR LENS  IMPLANT, BILATERAL    . COLONOSCOPY  01/30/2010   Jarold Motto  . ETHMOIDECTOMY Left 11/17/2020   Procedure: LEFT SIDED TOTAL ETHMOIDECTOMY;  Surgeon: Drema Halon, MD;  Location: [facility];  Service: ENT;  Laterality: Left;  . EYE SURGERY    . HEMORRHOID SURGERY    . JOINT REPLACEMENT    . LASIK    . left medial and superior rectus muscle recesion for diplopia correction    . SINUS ENDO WITH FUSION Left 11/17/2020   Procedure: SINUS ENDOSCOPY WITH FUSION NAVIGATION;  Surgeon: Drema Halon, MD;  Location: [facility];  Service: ENT;  Laterality: Left;  .  SPHENOIDECTOMY Left 11/17/2020   Procedure: LEFT SIDED SPHENOIDECTOMY WITH TISSUE REMOVAL;  Surgeon: Drema Halon, MD;  Location: [facility];  Service: ENT;  Laterality: Left;  . surgery of right eye rectus muscles for diplpia correction   2019  . TOTAL HIP ARTHROPLASTY Right 2010  . TOTAL HIP ARTHROPLASTY Left 08/19/2019   Procedure: TOTAL HIP ARTHROPLASTY ANTERIOR APPROACH;  Surgeon: Ollen Gross, MD;  Location: [facility];  Service: Orthopedics;  Laterality: Left;   . TURBINATE REDUCTION Left 11/17/2020   Procedure: LEFT SIDED TURBINATE REDUCTION;  Surgeon: Drema Halon, MD;  Location: [facility];  Service: ENT;  Laterality: Left;  . URETHRAL SLING     midurethral sling with TVT Exact and Cystoscopy  Current Outpatient Medications  Medication Sig Dispense Refill  . buPROPion (WELLBUTRIN XL) 150 MG 24 hr tablet Take 150 mg by mouth daily.    . Calcium Carbonate Antacid (CALCIUM CARBONATE PO) Take by mouth.    . carbidopa-levodopa (SINEMET) 25-100 MG tablet Start with 1/2 tab 3x a day separated by 4-6 hours. Could increase to a whole tablet as tolerated. Separate from protein 30 minutes before or 1 hour after protein meal. Can eat with crackers and ginger ale if it causes nausea. 90 tablet 3  . ezetimibe (ZETIA) 10 MG tablet ezetimibe 10 mg tablet  Take 1 tablet every day by oral route.    . Multiple Vitamin (MULTIVITAMIN WITH MINERALS) TABS tablet Take 1 tablet by mouth daily. Women's Multivitamin    . Polyethyl Glycol-Propyl Glycol (SYSTANE) 0.4-0.3 % SOLN Place 1 drop into both eyes 3 (three) times daily as needed (dry/irritated eyes.).    Marland Kitchen pravastatin (PRAVACHOL) 80 MG tablet Take 80 mg by mouth every evening.     . triamcinolone (NASACORT) 55 MCG/ACT AERO nasal inhaler Place 2 sprays into the nose daily. Use at night 1 each 12  . triamterene-hydrochlorothiazide (MAXZIDE-25) 37.5-25 MG tablet Take 1 tablet by mouth daily.     No  current facility-administered medications for this visit.    Allergies as of 05/03/2021 - Review Complete 05/03/2021  Allergen Reaction Noted  . Erythromycin Shortness Of Breath and Rash   . Macrobid [nitrofurantoin] Other (See Comments) 12/23/2017  . Penicillins Rash   . Sulfamethoxazole-trimethoprim Rash 10/19/2020    Vitals: BP 134/82 (BP Location: Left Arm, Patient Position: Sitting)   Pulse 82   Ht 5' 6.5" (1.689 m)   Wt 160 lb (72.6 kg)   BMI 25.44 kg/m  Last Weight:  Wt Readings from Last 1 Encounters:  05/03/21 160 lb (72.6 kg)   Last Height:   Ht Readings from Last 1 Encounters:  05/03/21 5' 6.5" (1.689 m)     Physical exam: Stable Exam: Gen: NAD, conversant, well nourised,  well groomed                     CV: RRR, no MRG. No Carotid Bruits. No peripheral edema, warm, nontender Eyes: Conjunctivae clear without exudates or hemorrhage  Neuro: Stable Detailed Neurologic Exam  Speech:    Speech is normal; fluent and spontaneous with normal comprehension.  Cognition:  MMSE - Mini Mental State Exam 01/16/2021  Orientation to time 5  Orientation to Place 5  Registration 3  Attention/ Calculation 4  Recall 3  Language- name 2 objects 2  Language- repeat 1  Language- follow 3 step command 3  Language- read & follow direction 1  Write a sentence 1  Copy design 1  Total score 29       The patient is oriented to person, place, and time;     recent and remote memory intact;     language fluent;     normal attention, concentration,     fund of knowledge Cranial Nerves:    The pupils are equal, round, and reactive to light.pupils too small to visualize fundi. Visual fields are full to finger confrontation. Extraocular movements are intact. Trigeminal sensation is intact and the muscles of mastication are normal. The face is symmetric. The palate elevates in the midline. Hearing intact. Voice is normal. Shoulder shrug is normal. The tongue has normal motion  without fasciculations.   Coordination:   Bradykinesia with FTN, difficulty with finger taps on  the left  Gait: mild bradykinesias, low clearance and short stride, en-bloc turning, decreased arm swing  Motor Observation: Left >right postural and kinetic tremor with FTN  Tone:     increased tone left arm with facilitation  Posture:    Posture is normal. normal erect    Strength: Left left hip and leg flexion mild weakness, left arm deltoid and biceps mild weakness. Right strength is V/V in the upper and lower limbs.      Sensation: intact to LT     Reflex Exam:  DTR's:    Deep tendon reflexes in the upper and lower extremities are brisk bilaterally.   Toes:    The toes are downgoing bilaterally.   Clonus:    Clonus is absent.    Assessment/Plan:   75 y.o. female here as requested by [name], MD for "headaches, ataxia and cognitive impairment".  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  - Patient is parkinsonian on exam(decreased arm swing, narrow gait with low clearance, left-sided weakness and decreased coordination, increased tone left arm with facilitation, slightly masked facies). She has a postural and action tremor. 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 We discussed motor and non motor symptoms, disease progression, treatments (dopamine), following with [facility]  - Start Sinemet very low dose, discussed side effects  - Important to keep active, activity may slow down progression,   - She has applied to [employer] for an independent living  facility  - make sure she has POA and HCPOA  - Gait abnormality : MRI cervical spine. (MRI brain normal) did not show any etiology for gait abnormality. Will refer to PT for balance, gait disorder. She will let us know what facility she would like she is going to be in [place] for several months.   - Cognitive complaints: Blood work completed, formal memory testing was overall positive, will repeat when needed, Speech Therapy for difficulty with expression and reception  - Discussed fall risks, orthostatic symptoms in PD and constipation and other things to watch for. Stay hydrated.   - Recommended speech therapy for her difficulty getting words out  - Recommend common sense with driving and consider formal driving test if needed in the future. She has very good insight and judgement.  - regular skin tests, increased risk of skin cancer in PD patient's  - Fatigue: May consider sleep evaluation, she has very vivid dreams.  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   - Follow up with [name] for her anxiety  - I agree with [name] and I do think the sphenoid sinusitis is likely the cause of her headaches especially since the headache has significantly improved with treatment (CT shows "Chronic left sphenoid sinusitis with progressive sclerotic wall thickening since May 2021. The ostium now appears covered by calcification". "Continued frothy secretions in the left posterior ethmoid air Cell.").   Orders Placed This Encounter  Procedures  . Ambulatory referral to Neurology   Meds ordered this encounter  Medications  . carbidopa-levodopa (SINEMET) 25-100 MG tablet    Sig: Start with 1/2 tab 3x a day separated by 4-6 hours. Could increase to a whole tablet as tolerated. Separate  from protein 30 minutes before or 1 hour after protein meal. Can eat with crackers and ginger ale if it causes nausea.    Dispense:  90 tablet    Refill:  3    Cc: [name], MD,   [name], MD  Naomie Dean, MD  [facility] [address] [place], [place] [address]  Phone [phone] Fax [phone]  I spent over 75 minutes of face-to-face and non-face-to-face time with patient on the  1. Idiopathic Parkinson's disease (HCC)    diagnosis.  This included previsit chart review, lab review, study review, order entry, electronic health record documentation, patient education on the different diagnostic and therapeutic options, counseling and coordination of care, risks and benefits of management, compliance, or risk factor reduction

## 2021-05-03 NOTE — Patient Instructions (Addendum)
Start Sinemet(Carbidopa/Levodopa)  - For constipation: try the "prune juice cocktail". Mix 1/2 cup applesauce, 2 tablespoons of miller's bran, 4-6 oz. prune juice. Store in Engineer, maintenance (IT). Take a tablespoonful per day at first, gradually increasing until you find the amount that works best. Most people find this mixture tasty.   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 We discussed motor and non motor symptoms, disease progression, treatments (dopamine), Will refer to [facility] Disorder Team  - Important to keep active, activity may slow down progression,   - She has applied to [facility] for an independent living facility  - make sure she has POA and HCPOA  - Gait abnormality : MRI cervical spine. (MRI brain normal for age) did not show any etiology for gait abnormality. Will refer to PT for balance, gait disorder. She will let us know what facility she would like she is going to be in [place] for several months.   - Cognitive complaints: Blood work completed, formal memory testing was overall positive, will repeat when needed, Speech Therapy for difficulty with expression and reception  - Discussed fall risks, orthostatic symptoms in PD(dizizness etc) and constipation and other things to watch for. Stay hydrated.   - Recommended speech therapy for her difficulty getting words out  - Recommend common sense with driving and consider formal driving test if needed in the future. She has very good insight and judgement.  - regular skin tests, increased risk of skin cancer in PD patient's  - Fatigue: May consider sleep evaluation, she has very vivid dreams.  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 Dr. Vickey Huger.   - Follow up with [name] for her anxiety    Carbidopa; Levodopa tablets What is this medicine? CARBIDOPA;LEVODOPA (kar bi DOE pa; lee voe DOE pa) is used to treat the symptoms of Parkinson's disease. This medicine may be used for  other purposes; ask your health care provider or pharmacist if you have questions. COMMON BRAND NAME(S): Atamet, Dhivy, SINEMET What should I tell my health care provider before I take this medicine? They need to know if you have any of these conditions:  depression or other mental illness  diabetes  glaucoma  heart disease, including history of a heart attack  history of irregular heartbeat  kidney disease  liver disease  lung or breathing disease, like asthma  narcolepsy  sleep apnea  stomach or intestine problems  an unusual or allergic reaction to levodopa, carbidopa, other medicines, foods, dyes, or preservatives  pregnant or trying to get pregnant  breast-feeding How should I use this medicine? Take this medicine by mouth with a glass of water. Follow the directions on the prescription label. Take your doses at regular intervals. Do not take your medicine more often than directed. Do not stop taking except on the advice of your doctor or health care professional. Talk to your pediatrician regarding the use of this medicine in children. Special care may be needed. Overdosage: If you think you have taken too much of this medicine contact a poison control center or emergency room at once. NOTE: This medicine is only for you. Do not share this medicine with others. What if I miss a dose? If you miss a dose, take it as soon as you can. If it is almost time for your next dose, take only that dose. Do not take double or extra doses. What may interact with this medicine? Do not take this medicine with any of the following  medications:  MAOIs like Marplan, Nardil, and Parnate  reserpine  tetrabenazine This medicine may also interact with the following medications:  alcohol  droperidol  entacapone  iron supplements or multivitamins with iron  isoniazid, INH  linezolid  medicines for depression, anxiety, or psychotic disturbances  medicines for high blood  pressure  medicines for sleep  metoclopramide  papaverine  procarbazine  tedizolid  rasagiline  selegiline  tolcapone This list may not describe all possible interactions. Give your health care provider a list of all the medicines, herbs, non-prescription drugs, or dietary supplements you use. Also tell them if you smoke, drink alcohol, or use illegal drugs. Some items may interact with your medicine. What should I watch for while using this medicine? Visit your health care professional for regular checks on your progress. Tell your health care professional if your symptoms do not start to get better or if they get worse. Do not stop taking except on your health care professional's advice. You may develop a severe reaction. Your health care professional will tell you how much medicine to take. You may experience a wearing of effect prior to the time for your next dose of this medicine. You may also experience an on-off effect where the medicine apparently stops working for anything from a minute to several hours, then suddenly starts working again. Tell your doctor or health care professional if any of these symptoms happen to you. Your dose may need to be changed. A high protein diet can slow or prevent absorption of this medicine. Avoid high protein foods near the time of taking this medicine to help to prevent these problems. Take this medicine at least 30 minutes before eating or one hour after meals. You may want to eat higher protein foods later in the day or in small amounts. Discuss your diet with your doctor or health care professional or nutritionist. You may get drowsy or dizzy. Do not drive, use machinery, or do anything that needs mental alertness until you know how this drug affects you. Do not stand or sit up quickly, especially if you are an older patient. This reduces the risk of dizzy or fainting spells. Alcohol may interfere with the effect of this medicine. Avoid alcoholic  drinks. When taking this medicine, you may fall asleep without notice. You may be doing activities like driving a car, talking, or eating. You may not feel drowsy before it happens. Contact your health care provider right away if this happens to you. There have been reports of increased sexual urges or other strong urges such as gambling while taking this medicine. If you experience any of these while taking this medicine, you should report this to your health care provider as soon as possible. If you are diabetic, this medicine may interfere with the accuracy of some tests for sugar or ketones in the urine (does not interfere with blood tests). Check with your doctor or health care professional before changing the dose of your diabetic medicine. This medicine may discolor the urine or sweat, making it look darker or red in color. This is of no cause for concern. However, this may stain clothing or fabrics. This medicine may cause a decrease in vitamin B6. You should make sure that you get enough vitamin B6 while you are taking this medicine. Discuss the foods you eat and the vitamins you take with your health care professional. What side effects may I notice from receiving this medicine? Side effects that you should report to your  doctor or health care professional as soon as possible:  allergic reactions like skin rash, itching or hives, swelling of the face, lips, or tongue  changes in emotions or moods  falling asleep during normal activities like driving  fast, irregular heartbeat  feeling faint or lightheaded, falls  fever  hallucinations  new or increased gambling urges, sexual urges, uncontrolled spending, binge or compulsive eating, or other urges  stomach pain  trouble passing urine or change in the amount of urine  uncontrollable movements of the arms, face, head, mouth, neck, or upper body Side effects that usually do not require medical attention (report to your doctor or  health care professional if they continue or are bothersome):  dizziness  headache  loss of appetite  nausea  trouble sleeping This list may not describe all possible side effects. Call your doctor for medical advice about side effects. You may report side effects to FDA at 1-800-FDA-1088. Where should I keep my medicine? Keep out of the reach of children. Store at room temperature between 15 and 30 degrees C (59 and 86 degrees F). Protect from light. Throw away any unused medicine after the expiration date. NOTE: This sheet is a summary. It may not cover all possible information. If you have questions about this medicine, talk to your doctor, pharmacist, or health care provider.  2021 [employer] Standard (2019-08-03 19:49:59)

## 2021-05-15 ENCOUNTER — Telehealth: Payer: Self-pay

## 2021-05-15 NOTE — Telephone Encounter (Signed)
Referral to [facility] sent via Epic fax. P: [id number]. F: [phone].

## 2021-05-18 DIAGNOSIS — R531 Weakness: Secondary | ICD-10-CM

## 2021-05-18 DIAGNOSIS — R269 Unspecified abnormalities of gait and mobility: Secondary | ICD-10-CM

## 2021-05-18 DIAGNOSIS — G20 Parkinson's disease: Secondary | ICD-10-CM

## 2021-05-19 ENCOUNTER — Telehealth: Payer: Self-pay | Admitting: Neurology

## 2021-05-19 NOTE — Telephone Encounter (Signed)
At 5:03 pt left vm for Dr [name] stating she has found [facility] to be a place she can get PT. Pt provided their telephone # to be [phone]. Pt is asking for a call.

## 2021-05-22 NOTE — Telephone Encounter (Signed)
Pt also sent a mychart message with the information. Will respond there.

## 2021-06-01 ENCOUNTER — Other Ambulatory Visit: Payer: Self-pay | Admitting: Neurology

## 2021-06-05 ENCOUNTER — Other Ambulatory Visit: Payer: Self-pay | Admitting: Neurology

## 2021-07-28 ENCOUNTER — Telehealth (INDEPENDENT_AMBULATORY_CARE_PROVIDER_SITE_OTHER): Payer: Self-pay | Admitting: Otolaryngology

## 2021-07-28 NOTE — Telephone Encounter (Signed)
Called a prescription for Ceftin 250 mg twice daily for 2 weeks and to [facility] in [facility] for her at her request.

## 2021-10-04 ENCOUNTER — Ambulatory Visit: Payer: Medicare PPO | Admitting: Neurology

## 2021-10-04 DIAGNOSIS — G20A1 Parkinson's disease without dyskinesia, without mention of fluctuations: Secondary | ICD-10-CM | POA: Insufficient documentation

## 2021-10-05 ENCOUNTER — Ambulatory Visit: Payer: Medicare PPO | Admitting: Neurology

## 2021-10-05 ENCOUNTER — Encounter: Payer: Self-pay | Admitting: Neurology

## 2021-10-05 VITALS — BP 128/81 | HR 72 | Ht 66.5 in | Wt 151.0 lb

## 2021-10-05 DIAGNOSIS — F419 Anxiety disorder, unspecified: Secondary | ICD-10-CM

## 2021-10-05 DIAGNOSIS — G20 Parkinson's disease: Secondary | ICD-10-CM | POA: Diagnosis not present

## 2021-10-05 DIAGNOSIS — F32A Depression, unspecified: Secondary | ICD-10-CM

## 2021-10-05 DIAGNOSIS — J324 Chronic pansinusitis: Secondary | ICD-10-CM

## 2021-10-05 NOTE — Patient Instructions (Addendum)
-   Marie Ford for medical management and therapy recommendations - Physical Therapy - Neuro in our building - resources in the community - see packet - Now established with Dr. Olin Hauser but will follow with me until/if needed to see [facility] again - follow up 6 months with Dr. [name]

## 2021-10-05 NOTE — Progress Notes (Signed)
[name]    Provider:  Dr [name] Requesting Provider: [name], MD Primary Care Provider:  [name], MD  CC:  Headache, memory loss, gait abnormality  HPI 01/16/2021: [name]  Marie Ford is a 75 y.o. female here as requested by [name], MD for "headaches, ataxia and cognitive impairment".  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  I reviewed Dr. [name] notes: Over the last year she has had chronic symptoms of headaches, CT scan of the sinuses in April showed sphenoid sinusitis, she had similar findings the year before, she saw Dr. [name] in ENT in April of this year and he did not feel the sinusitis required surgery was necessarily the cause of her headaches.  Associated with her headaches have been increasing symptoms of anxiety and depression, much of this related to stress, recently improved on fluoxetine, however the headaches fatigue and nausea have continued.  She also complains of ataxia over the last few months a few times a week as well as cognitive impairment short-term memory deficits and she is interested in more formal neuropsychological testing.  MRI of the brain in October was unremarkable but also showed the sphenoid sinusitis.   She is here with her friend of 35 years, headaches are improved since surgery and antibiotic. Over the last 2 years she has had cognitive decline and physicial decline and lots of stress, hip replace,ent, 18-year relationship ended, she had to  sell a house sh eloved and some communication problems with daughter, headaches are not so much the issue anymore. She has tremors under stress and feels "jittery". The left side doesn't work as well on the left side, she has imbalance on the left side, grip is weakness on the left side, hand writing has changed has gotten smaller. She feels clumsy and slow, her friend provides much information and says she is having difficulty with fly fishing and she is a well known for her fly fishing, her gait is different and she is weak, chronic fatigue, smell and taste is pretty much intact but not great she can't tell the difference between teas, some of the scent is gone, swallowing is ok, tremor is resting and with action, worse when under stress, left is worse. Friend has noticed her memory is declining more than she thinks is normal, she is not sleeping well at all, trouble falling asleep. She tries to meditate, she may try to eat something, she takes Xanax sometimes. Her gait is different, her left side is affected, hard to move that side and she doesn't swing her left arm. She walks her dog every day. Difficulty with the left hand with coordination, no falls, feels like she is shuffling, not lightheaded, she feels unbalanced, difficulty tieing the flys for fly fishing. Father died of stroke, no parkinson's disease. She is having difficulty remembering to do things, she loses steps, she has compensation techniques and uses her calendars, not getting lost, no hallucinations or delusions, no acting out dreams, but she is having vivid dreams, more dreaming, much more emotional and depressed but not overreacting. Friend states he looks sadder more flat in affect.  Reviewed notes, labs and imaging from outside physicians, which showed:   October 07, 2020: CMP with BUN 24, creatinine 1.12, glucose 101, otherwise unremarkable, TSH 2.59, sed rate 17 normal, CBC normal.  CT Sinuses 11/10/2020: reviewed report and  personally reviewed images (also reviewed with patient) FINDINGS: Paranasal sinuses:   Frontal: Aplastic on the left and hypoplastic on the right   Ethmoid: Frothy secretions in the posterior left ethmoid air cell.   Maxillary: Small presumed retention cysts on the right more than left.   Sphenoid: Chronic left sphenoid sinusitis with complete opacification and sclerotic wall thickening. Sclerotic wall thickening has progressed and the ostium appears covered by bone currently.   Right ostiomeatal unit: Patent.   Left ostiomeatal unit: Patent.   Nasal passages: Patent. Intact nasal septum is essentially midline.   Anatomy: No pneumatization superior to anterior ethmoid notches. Sellar sphenoid pneumatization pattern. No dehiscence of carotid or optic canals. No onodi cell.   Other: No incidental finding on soft tissue windows.   IMPRESSION: 1. Chronic left sphenoid sinusitis with progressive sclerotic wall thickening since May 2021. The ostium now appears covered by calcification. 2. Continued frothy secretions in the left posterior ethmoid air Cell.  MRI brain 09/30/2020: Mild  chronic microvascular ischemic changes, typical for age.(personally reviewed and agree with findings) reviewed report and personally reviewed images (also reviewed with patient)  10/05/2021: Here for follow up. Was seen at [facility] by a movement disorder specialist, her recommendations were to continue Sinemet, we reviewed the notes and the appointment at [facility] together with her good friend today, we reviewed resources in the community and I provided her with documentation on Parkinson's groups, Parkinson's specific exercise groups, patient, also patient feels she is depressed and has anxiety, would discuss with primary care [name], per patient she has been tried on multiple medications, we did discuss at this point referring her to [facility] at [facility] that can help her with medication  management and also refer her to a therapist. I can refer her for physical therapy for Parkinson's disease specific PT. Headaches resolved after sphenoidectomy.   Interval history 05/03/2021: DAT scan consistent with parkinsonian disorder. We discussed today. I recommend patient be transitioned to [facility] Movements Disorder Team. They are the leaders in parkinson's Disease in this area.   She has tremor in her left hand at rest, worse with stress, she has some cognitive impairments and weak, she feels she can;t find the words and can't understand directions. We discussed speech therapy, they are concerned about driving and he has a weakness in visual memory, we discussed a driving test, discussed regular skin tests with a dermatologist.     DAT Scan 02/09/2021: Marked decreased activity within the LEFT and RIGHT striatum. Slightly greater deficit on the RIGHT. This pattern can be found in Parkinsonian syndromes pathology.  Review of Systems: Patient complains of symptoms per HPI as well as the following symptoms: anxiety . Pertinent negatives and positives per HPI. All others negative .   Social History   Socioeconomic History   Marital status: Single    Spouse name: Not on file   Number of children: Not on file   Years of education: Not on file   Highest education level: Doctorate  Occupational History   Not on file  Tobacco Use   Smoking status: Never   Smokeless tobacco: Never  Vaping Use   Vaping Use: Never used  Substance and Sexual Activity   Alcohol use: Yes  Comment: occ wine   Drug use: No   Sexual activity: Not Currently    Birth control/protection: Post-menopausal  Other Topics Concern   Not on file  Social History Narrative   Lives at home alone   Right handed   Caffeine: rarely   Social Determinants of Health   Financial Resource Strain: Not on file  Food Insecurity: Not on file  Transportation Needs: Not on file  Physical Activity: Not on file   Stress: Not on file  Social Connections: Not on file  Intimate Partner Violence: Not on file    Family History  Problem Relation Age of Onset   Congestive Heart Failure Mother    CVA Father    Heart failure Father    Liver cancer Brother    Colon cancer Brother 29   Colitis Brother    Non-Hodgkin's lymphoma Sister    Hypertension Brother    Gout Brother    Alcohol abuse Sister    Melanoma Daughter    Esophageal cancer Neg Hx    Rectal cancer Neg Hx    Stomach cancer Neg Hx    Colon polyps Neg Hx     Past Medical History:  Diagnosis Date   Chronic kidney disease    stage 3    Chronic sphenoidal sinusitis    Degenerative arthritis of hip    requirde surgery 2009   Depression 2010   Diplopia 2011   in left lateral gaze    Diplopia    " my eyes dont track together, i have corrective prisms in my eye glasses"    Diverticulosis 2006   noted in a colonoscopy   Esophageal reflux 2013   Hypertension    IGT (impaired glucose tolerance) 2011   denies hx of diabetes    Insomnia 2009   Mixed hyperlipidemia 2000   Osteoarthritis    Osteopenia 2005   Other and unspecified hyperlipidemia 2000   Other specified disease of sebaceous glands    Palpitations 2009   secondary to PSVT   PNA (pneumonia) 2014   Psoriasis 2004   PSVT (paroxysmal supraventricular tachycardia) (HCC) 2009   "report it has been some time since i had that "    Situational stress    Venous insufficiency since 2017    Patient Active Problem List   Diagnosis Date Noted   Parkinson's disease (HCC) 10/08/2021   Anxiety 10/08/2021   Depression 10/08/2021   OA (osteoarthritis) of hip 08/19/2019   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   Diverticular disease 07/10/2017   Gastroesophageal reflux disease 07/10/2017   Osteopenia 07/10/2017   Insomnia 06/03/2016   Essential hypertension 03/26/2014   Hyperlipidemia 03/01/2014   Other specified postprocedural states 03/01/2014   Exertional dyspnea 02/07/2014    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   Diplopia 12/15/2013   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    Past Surgical History:  Procedure Laterality Date   CATARACT EXTRACTION W/ INTRAOCULAR LENS  IMPLANT, BILATERAL     COLONOSCOPY  01/30/2010   Jarold Motto   ETHMOIDECTOMY Left 11/17/2020   Procedure: LEFT SIDED TOTAL ETHMOIDECTOMY;  Surgeon: Drema Halon, MD;  Location: [facility];  Service: ENT;  Laterality: Left;   EYE SURGERY     HEMORRHOID SURGERY     JOINT REPLACEMENT     LASIK     left medial and superior rectus muscle recesion for diplopia correction     SINUS ENDO WITH FUSION Left 11/17/2020   Procedure: SINUS ENDOSCOPY WITH FUSION NAVIGATION;  Surgeon: Drema Halon, MD;  Location[facility];  Service: ENT;  Laterality: Left;   SPHENOIDECTOMY Left 11/17/2020   Procedure: LEFT SIDED SPHENOIDECTOMY WITH TISSUE REMOVAL;  Surgeon: Drema Halon, MD;  Location: [facility];  Service: ENT;  Laterality: Left;   surgery of right eye rectus muscles for diplpia correction   2019   TOTAL HIP ARTHROPLASTY Right 2010   TOTAL HIP ARTHROPLASTY Left 08/19/2019   Procedure: TOTAL HIP ARTHROPLASTY ANTERIOR APPROACH;  Surgeon: Ollen Gross, MD;  Location: [facility];  Service: Orthopedics;  Laterality: Left;    TURBINATE REDUCTION Left 11/17/2020   Procedure: LEFT SIDED TURBINATE REDUCTION;  Surgeon: Drema Halon, MD;  Location: [facility];  Service: ENT;  Laterality: Left;   URETHRAL SLING     midurethral sling with TVT Exact and Cystoscopy    Current Outpatient Medications  Medication Sig Dispense Refill   buPROPion (WELLBUTRIN XL) 150 MG 24 hr tablet Take 150 mg by mouth daily.     Calcium Carbonate Antacid (CALCIUM CARBONATE PO) Take by mouth.     carbidopa-levodopa (SINEMET) 25-100 MG tablet Start with 1/2 tab 3x a day separated by 4-6 hours. Could increase to a whole tablet as  tolerated. Separate from protein 30 minutes before or 1 hour after protein meal. Can eat with crackers and ginger ale if it causes nausea. 90 tablet 3   ezetimibe (ZETIA) 10 MG tablet ezetimibe 10 mg tablet  Take 1 tablet every day by oral route.     Multiple Vitamin (MULTIVITAMIN WITH MINERALS) TABS tablet Take 1 tablet by mouth daily. Women's Multivitamin     Polyethyl Glycol-Propyl Glycol (SYSTANE) 0.4-0.3 % SOLN Place 1 drop into both eyes 3 (three) times daily as needed (dry/irritated eyes.).     pravastatin (PRAVACHOL) 80 MG tablet Take 80 mg by mouth every evening.      triamcinolone (NASACORT) 55 MCG/ACT AERO nasal inhaler Place 2 sprays into the nose daily. Use at night 1 each 12   triamterene-hydrochlorothiazide (MAXZIDE-25) 37.5-25 MG tablet Take 1 tablet by mouth daily.     No current facility-administered medications for this visit.    Allergies as of 10/05/2021 - Review Complete 10/05/2021  Allergen Reaction Noted   Erythromycin Shortness Of Breath and Rash    Macrobid [nitrofurantoin] Other (See Comments) 12/23/2017   Penicillins Rash    Sulfamethoxazole-trimethoprim Rash 10/19/2020    Vitals: BP 128/81 (BP Location: Left Arm, Patient Position: Sitting)   Pulse 72   Ht 5' 6.5" (1.689 m)   Wt 151 lb (68.5 kg)   BMI 24.01 kg/m  Last Weight:  Wt Readings from Last 1 Encounters:  10/05/21 151 lb (68.5 kg)   Last Height:   Ht Readings from Last 1 Encounters:  10/05/21 5' 6.5" (1.689 m)     Physical exam: Stable Exam: Gen: NAD, conversant, well nourised,  well groomed                     CV: RRR, no MRG. No Carotid Bruits. No peripheral edema, warm, nontender Eyes: Conjunctivae clear without exudates or hemorrhage  Neuro: Stable Detailed Neurologic Exam  Speech:    Speech is normal; fluent and spontaneous with normal comprehension.  Cognition:  MMSE - Mini Mental State Exam 01/16/2021  Orientation to time 5  Orientation to Place 5  Registration 3   Attention/ Calculation 4  Recall 3  Language- name 2 objects 2  Language- repeat 1  Language- follow 3 step command 3  Language- read & follow direction 1  Write a sentence 1  Copy design 1  Total score 29       The patient is oriented to person, place, and time;     recent and remote memory intact;     language fluent;     normal attention, concentration,     fund of knowledge Cranial Nerves:    The pupils are equal, round, and reactive to light.pupils too small to visualize fundi. Visual fields are full to finger confrontation. Extraocular movements are intact. Trigeminal sensation is intact and the muscles of mastication are normal. The face is symmetric. The palate elevates in the midline. Hearing intact. Voice is normal. Shoulder shrug is normal. The tongue has normal motion without fasciculations.   Coordination:   Bradykinesia with FTN, difficulty with finger taps on the left  Gait: mild bradykinesias, low clearance and short stride, en-bloc turning, decreased arm swing  Motor Observation: Left >right postural and kinetic tremor with FTN  Tone:     increased tone left arm with facilitation  Posture:    Posture is normal. normal erect    Strength: Left left hip and leg flexion mild weakness, left arm deltoid and biceps mild weakness. Right strength is V/V in the upper and lower limbs.      Sensation: intact to LT     Reflex Exam:  DTR's:    Deep tendon reflexes in the upper and lower extremities are brisk bilaterally.   Toes:    The toes are downgoing bilaterally.   Clonus:    Clonus is absent.    Assessment/Plan:   3 75 y.o. female here as requested by [name], MD for follow up of Parkinson's Disease.  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  - We reviewed the [facility] movement disorder notes and the appointment at [facility] together with her good friend today - We reviewed resources in the community and I provided her with information on Parkinson's groups, Parkinson's specific exercise groups, patient,  - Patient feels she is depressed and has anxiety, recommended discussion with primary care [name], per patient she has been tried on multiple medications, we did discuss at this point referring her to a provider at [facility] that can help her with medication management and also refer her to a therapist. Tamela Oddi.  - I can refer her for physical therapy for Parkinson's disease specific PT. I walked over to physical therapy with patient and helped her make an appointment. Will refer to PT for balance, gait disorder.  .- headaches resolved after sphenoidectomy. - Now established with Dr. Evern Bio at [facility] but will follow with me until/if needed to see [facility] again. Continue Sinemet - Christia Reading: Dr. [name] retired, recently had sphenoidectomy, would like to see and establish care with a new ENT  - Patient is parkinsonian on exam(decreased arm swing, narrow gait with low clearance, left-sided weakness and decreased coordination, increased tone left arm with facilitation, slightly masked facies). She has a postural and action tremor. 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 We discussed motor and non motor symptoms, disease progression,  treatments (dopamine), was seen at [facility] and will continue to follow with me  - Continue Sinemet very low dose, discussed side effects  - Important to keep active, activity may slow down progression,   - She moved to an independent  living facility, lots of changes ongoing in her life  - make sure she has POA and HCPOA  - Gait abnormality : MRI cervical spine. (MRI brain normal) did not show any etiology for gait abnormality. Will refer to PT for balance, gait disorder.    - Cognitive complaints: Blood work completed, formal memory testing was overall positive, will repeat when needed, Speech Therapy for difficulty with expression and reception  - Discussed fall risks, orthostatic symptoms in PD and constipation and other things to watch for. Stay hydrated.   - Recommended speech therapy for her difficulty getting words out  - Recommend common sense with driving and consider formal driving test if needed in the future. She has very good insight and judgement.  - regular skin tests, increased risk of skin cancer in PD patient's  - Fatigue: May consider sleep evaluation, she has very vivid dreams.  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   - Follow up with [name] for her anxiety  - I agree with [name] and I do think the sphenoid sinusitis is likely the cause of her headaches especially since the headache has significantly improved with treatment (CT shows "Chronic left sphenoid sinusitis with progressive sclerotic wall thickening since May 2021. The ostium now appears covered by calcification". "Continued frothy secretions in the left posterior ethmoid air Cell.").   Orders Placed This Encounter  Procedures   Ambulatory referral to Physical Therapy   Ambulatory referral to Psychiatry   Ambulatory referral to ENT    No orders of the defined types were placed in this encounter.   Cc: [name], MD,  [name], MD  Naomie Dean, MD  [facility] [address] [place], [place] [address]  Phone [phone] Fax [phone]  I spent over 70 minutes of face-to-face and non-face-to-face time with patient on the  1. Parkinson's disease  (HCC)   2. Anxiety   3. Depression, unspecified depression type   4. Chronic pansinusitis     diagnosis.  This included previsit chart review, lab review, study review, order entry, electronic health record documentation, patient education on the different diagnostic and therapeutic options, counseling and coordination of care, risks and benefits of management, compliance, or risk factor reduction

## 2021-10-08 DIAGNOSIS — G20 Parkinson's disease: Secondary | ICD-10-CM | POA: Insufficient documentation

## 2021-10-08 DIAGNOSIS — F32A Depression, unspecified: Secondary | ICD-10-CM | POA: Insufficient documentation

## 2021-10-08 DIAGNOSIS — F419 Anxiety disorder, unspecified: Secondary | ICD-10-CM | POA: Insufficient documentation

## 2021-10-09 ENCOUNTER — Telehealth: Payer: Self-pay | Admitting: Neurology

## 2021-10-09 NOTE — Telephone Encounter (Signed)
Psychiatry referral sent to Tamela Oddi @ [facility]. Phone: [phone].

## 2021-10-09 NOTE — Telephone Encounter (Signed)
Sent to Dr. Bates ph # [phone] 

## 2021-10-12 ENCOUNTER — Ambulatory Visit
Admission: RE | Admit: 2021-10-12 | Discharge: 2021-10-12 | Disposition: A | Discharge status: Home / Self Care | Payer: Medicare PPO | Source: Ambulatory Visit | Attending: Family Medicine | Admitting: Family Medicine

## 2021-10-12 DIAGNOSIS — Z1231 Encounter for screening mammogram for malignant neoplasm of breast: Secondary | ICD-10-CM

## 2021-10-13 ENCOUNTER — Encounter: Payer: Self-pay | Admitting: Physical Therapy

## 2021-10-13 ENCOUNTER — Ambulatory Visit: Payer: Medicare PPO | Attending: Neurology | Admitting: Physical Therapy

## 2021-10-13 ENCOUNTER — Other Ambulatory Visit: Payer: Self-pay

## 2021-10-13 DIAGNOSIS — R2681 Unsteadiness on feet: Secondary | ICD-10-CM | POA: Diagnosis present

## 2021-10-13 DIAGNOSIS — R29818 Other symptoms and signs involving the nervous system: Secondary | ICD-10-CM

## 2021-10-13 DIAGNOSIS — G20 Parkinson's disease: Secondary | ICD-10-CM | POA: Diagnosis not present

## 2021-10-13 DIAGNOSIS — R2689 Other abnormalities of gait and mobility: Secondary | ICD-10-CM

## 2021-10-13 NOTE — Therapy (Addendum)
[facility] [address] [place], [place], [address] Phone: [phone]   Fax:  [phone]  Physical Therapy Evaluation  Patient Details  Name: Marie Ford MRN: [id number] Date of Birth: 1946/02/11 Referring Provider (PT): Dr. Jaynee Eagles   Encounter Date: 10/13/2021   PT End of Session - 10/13/21 1359     Visit Number 1    Number of Visits 13    Date for PT Re-Evaluation 01/11/22    Authorization Type [employer]    PT Start Time 1319    PT Stop Time 1402    PT Time Calculation (min) 43 min    Equipment Utilized During Treatment Gait belt    Activity Tolerance Patient tolerated treatment well    Behavior During Therapy [facility] for tasks assessed/performed             Past Medical History:  Diagnosis Date   Chronic kidney disease    stage 3    Chronic sphenoidal sinusitis    Degenerative arthritis of hip    requirde surgery 2009   Depression 2010   Diplopia 2011   in left lateral gaze    Diplopia    " my eyes dont track together, i have corrective prisms in my eye glasses"    Diverticulosis 2006   noted in a colonoscopy   Esophageal reflux 2013   Hypertension    IGT (impaired glucose tolerance) 2011   denies hx of diabetes    Insomnia 2009   Mixed hyperlipidemia 2000   Osteoarthritis    Osteopenia 2005   Other and unspecified hyperlipidemia 2000   Other specified disease of sebaceous glands    Palpitations 2009   secondary to PSVT   PNA (pneumonia) 2014   Psoriasis 2004   PSVT (paroxysmal supraventricular tachycardia) ([place]) 2009   "report it has been some time since i had that "    Situational stress    Venous insufficiency since 2017    Past Surgical History:  Procedure Laterality Date   CATARACT EXTRACTION W/ INTRAOCULAR LENS  IMPLANT, BILATERAL     COLONOSCOPY  01/30/2010   Sharlett Iles   ETHMOIDECTOMY Left 11/17/2020   Procedure: LEFT SIDED TOTAL ETHMOIDECTOMY;  Surgeon: Rozetta Nunnery, MD;  Location: [facility];  Service: ENT;  Laterality: Left;   EYE SURGERY     HEMORRHOID SURGERY     JOINT REPLACEMENT     LASIK     left medial and superior rectus muscle recesion for diplopia correction     SINUS ENDO WITH FUSION Left 11/17/2020   Procedure: SINUS ENDOSCOPY WITH FUSION NAVIGATION;  Surgeon: Rozetta Nunnery, MD;  Location: [facility];  Service: ENT;  Laterality: Left;   SPHENOIDECTOMY Left 11/17/2020   Procedure: LEFT SIDED SPHENOIDECTOMY WITH TISSUE REMOVAL;  Surgeon: Rozetta Nunnery, MD;  Location: [facility];  Service: ENT;  Laterality: Left;   surgery of right eye rectus muscles for diplpia correction   2019   TOTAL HIP ARTHROPLASTY Right 2010   TOTAL HIP ARTHROPLASTY Left 08/19/2019   Procedure: TOTAL HIP ARTHROPLASTY ANTERIOR APPROACH;  Surgeon: Gaynelle Arabian, MD;  Location: [facility];  Service: Orthopedics;  Laterality: Left;  163min   TURBINATE REDUCTION Left 11/17/2020   Procedure: LEFT SIDED TURBINATE REDUCTION;  Surgeon: Rozetta Nunnery, MD;  Location: [facility];  Service: ENT;  Laterality: Left;   URETHRAL SLING     midurethral sling with TVT Exact and  Cystoscopy    There were no vitals filed for this visit.    Subjective Assessment - 10/13/21 1323     Subjective Was diagnosed with PD earlier this year, got PT at the time when she was living in [place] (did not do any PWR or LSVT BIG exericse). Has chronic fatigue due to difficulty sleeping. Feels like she has gotten weaker and her balance is off. No falls or almost falls. Saw her optometrist recently and reports that she has difficulty with tracking her eyes. Signed up for a water aerobics class - going to try to do 2x a week. Needs to get back in the groove of walking. Follows up with a PD specialist at [facility].    Pertinent History PMH: PD (diagnosed 2022), osteopenia, osteoarthritis, diplopia left lateral  gaze since 2011 -had surgery in about ~2019 (has corrective prisms but haven't been wearing them), HLD, CKD stage III    Limitations Walking    Diagnostic tests DAT Scan 02/09/2021:  Marked decreased activity within the LEFT and RIGHT striatum.  Slightly greater deficit on the RIGHT. This pattern can be found in  Parkinsonian syndromes pathology.    Patient Stated Goals increase strength so she can walk a mile, balance on one leg for 20 seconds.    Currently in Pain? No/denies                [facility] PT Assessment - 10/13/21 1329       Assessment   Medical Diagnosis PD    Referring Provider (PT) Dr. [name]    Onset Date/Surgical Date 10/05/21    Hand Dominance Right    Prior Therapy Previous therapy earlier this year after PD diagnosis      Balance Screen   Has the patient fallen in the past 6 months No    Has the patient had a decrease in activity level because of a fear of falling?  No   just more concious   Is the patient reluctant to leave their home because of a fear of falling?  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 [employer] Used to be a Research scientist (physical sciences), did a lot of traveling      Artist    Additional Comments will sometimes get restless leg      Posture/Postural Control   Posture/Postural Control Postural limitations    Posture Comments slight trunk lean to R in standing/sitting      ROM / Strength   AROM / PROM / Strength Strength      Strength   Strength Assessment Site Hip;Knee;Ankle    Right/Left Hip Right;Left    Right Hip Flexion 5/5     Left Hip Flexion 4+/5    Right/Left Knee Right;Left    Right Knee Flexion 5/5    Right Knee Extension 5/5    Left Knee Flexion 5/5    Left Knee Extension 5/5  Right/Left Ankle Right;Left    Right Ankle Dorsiflexion 5/5    Left Ankle Dorsiflexion 5/5      Transfers   Transfers Sit to Stand;Stand to Sit    Sit to Stand 5: Supervision    Five time sit to stand comments  8.81 seconds, decr forward lean    Stand to Sit 5: Supervision      Ambulation/Gait   Ambulation/Gait Yes    Ambulation/Gait Assistance 6: Modified independent (Device/Increase time)    Assistive device None    Gait Pattern Step-through pattern;Decreased arm swing - left;Decreased trunk rotation    Ambulation Surface Level;Indoor    Gait velocity 11.03 seconds = 2.97 ft/sec      Standardized Balance Assessment   Standardized Balance Assessment Mini-BESTest;Timed Up and Go Test      Mini-BESTest   Sit To Stand Normal: Comes to stand without use of hands and stabilizes independently.    Rise to Toes Normal: Stable for 3 s with maximum height.    Stand on one leg (left) Moderate: < 20 s   13.72, 4.81   Stand on one leg (right) Moderate: < 20 s   1.81, 2.35   Stand on one leg - lowest score 1    Compensatory Stepping Correction - Forward Normal: Recovers independently with a single, large step (second realignement is allowed).    Compensatory Stepping Correction - Backward Normal: Recovers independently with a single, large step    Compensatory Stepping Correction - Left Lateral Severe: Falls, or cannot step   does not step   Compensatory Stepping Correction - Right Lateral Severe:  Falls, or cannot step   does not step   Stepping Corredtion Lateral - lowest score 0    Stance - Feet together, eyes open, firm surface  Normal: 30s    Stance - Feet together, eyes closed, foam surface  Normal: 30s    Incline - Eyes Closed Normal: Stands independently 30s and aligns with gravity    Change in Gait Speed Normal:  Significantly changes walkling speed without imbalance    Walk with head turns - Horizontal Normal: performs head turns with no change in gait speed and good balance    Walk with pivot turns Normal: Turns with feet close FAST (< 3 steps) with good balance.    Step over obstacles Normal: Able to step over box with minimal change of gait speed and with good balance.    Timed UP & GO with Dual Task Moderate: Dual Task affects either counting OR walking (>10%) when compared to the TUG without Dual Task.    Mini-BEST total score 24      Timed Up and Go Test   Normal TUG (seconds) 7.69    Manual TUG (seconds) 7.41    Cognitive TUG (seconds) 12.66                        Objective measurements completed on examination: See above findings.                PT Education - 10/13/21 1407     Education Details Clinical findings, POC, importance of exercise with PD    Person(s) Educated Patient    Methods Explanation    Comprehension Verbalized understanding                PT Short Term Goals - 10/15/21 2004       PT SHORT TERM GOAL #1   Title Pt will  be independent with initial HEP in order to build upon functional gains made in therapy. ALL STGS DUE 11/05/21    Time 3    Period Weeks    Status New    Target Date 11/05/21      PT SHORT TERM GOAL #2   Title Pt will undergo assessment of 6MWT with LTG written as appropriate.    Time 3    Period Weeks    Status New      PT SHORT TERM GOAL #3   Title Pt will improve gait speed to at least 3.3 ft/sec in order to demo improved community mobility.    Time 3    Period Weeks    Status New             PT Long Term Goals - 10/15/21 2008       PT LONG TERM GOAL #1   Title Pt will be independent with final HEP in order to build upon functional gains made in therapy. ALL LTGS DUE 11/26/21    Time 6    Period Weeks    Status New    Target Date 11/26/21      PT LONG TERM GOAL #2   Title Pt will  verbalize understanding of local Parkinson's disease resources, including options for continue community fitness.    Time 6    Period Weeks    Status New      PT LONG TERM GOAL #3   Title 6MWT goal to be written as appropriate in order to demo improved endurance.    Time 6    Period Weeks    Status New      PT LONG TERM GOAL #4   Title Pt will improve TUG and TUG cog score to less than or equal to 10% difference for improved dual task/decreased fall risk.    Time 6    Period Weeks    Status New      PT LONG TERM GOAL #5   Title Pt will improve SLS time on RLE and LLE to at least 10 seconds for improved balance for IADLs.    Baseline 2.35 seconds RLE, 4.81 seconds LLE    Time 6    Period Weeks    Status New                   10/15/21 2010  Plan  Clinical Impression Statement Patient is a 75 year old female referred to Neuro OPPT for PD.   Pt's PMH is significant for: PD (diagnosed 2022), osteopenia, osteoarthritis, diplopia left lateral gaze since 2011 -had surgery in about ~2019 (has corrective prisms but haven't been wearing them), HLD, CKD stage III.  The following deficits were present during the exam: decr timing/coordination of gait, impaired balance, postural abnormalities, decr functional strength. Based on cog TUG/TUG, pt with difficulties with cognitive dual tasking. With miniBEST, pt with difficulty with SLS balance and lateral stepping strategies. Pt would benefit from skilled PT to address these impairments and functional limitations to maximize functional mobility independence. Pt will also benefit from further PD education/community resources due to being recently diagnosed.  Personal Factors and Comorbidities Comorbidity 3+;Time since onset of injury/illness/exacerbation;Past/Current Experience  Comorbidities PD (diagnosed 2022), osteopenia, osteoarthritis, diplopia left lateral gaze since 2011 -had surgery in about ~2019 (has corrective prisms but haven't been  wearing them), HLD, CKD stage III  Examination-Activity Limitations Locomotion Level  Examination-Participation Restrictions Community Activity (fly fishing)  Pt will benefit from  skilled therapeutic intervention in order to improve on the following deficits Abnormal gait;Decreased activity tolerance;Decreased balance;Decreased endurance;Decreased coordination;Decreased strength;Impaired flexibility;Postural dysfunction  Stability/Clinical Decision Making Stable/Uncomplicated  Clinical Decision Making Low  Rehab Potential Good  PT Frequency 2x / week  PT Duration 12 weeks  PT Treatment/Interventions ADLs/Self Care Home Management;Gait training;Stair training;Therapeutic activities;Functional mobility training;Therapeutic exercise;DME Instruction;Balance training;Neuromuscular re-education;Patient/family education;Vestibular  PT Next Visit Plan perform with goal written. start on standing PWR moves. gait training with arm swing and step length.  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   Diverticular disease 07/10/2017   Gastroesophageal reflux disease 07/10/2017   Osteopenia 07/10/2017   Insomnia 06/03/2016   Essential hypertension 03/26/2014   Hyperlipidemia 03/01/2014   Other specified postprocedural states 03/01/2014   Exertional dyspnea 02/07/2014   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

## 2021-10-15 NOTE — Addendum Note (Signed)
Addended by: Drake Leach on: 10/15/2021 08:18 PM   Modules accepted: Orders

## 2021-10-17 ENCOUNTER — Ambulatory Visit: Payer: Medicare PPO | Admitting: Physical Therapy

## 2021-10-17 ENCOUNTER — Other Ambulatory Visit: Payer: Self-pay

## 2021-10-17 DIAGNOSIS — R2681 Unsteadiness on feet: Secondary | ICD-10-CM | POA: Diagnosis not present

## 2021-10-17 DIAGNOSIS — R2689 Other abnormalities of gait and mobility: Secondary | ICD-10-CM

## 2021-10-17 NOTE — Therapy (Signed)
[facility] [address] [place], [place], [address] Phone: [phone]   Fax:  [phone]  Physical Therapy Treatment  Patient Details  Name: Marie Ford MRN: [id number] Date of Birth: [birth year] Referring Provider (PT): Dr. [name]   Encounter Date: 10/17/2021   PT End of Session - 10/17/21 1750     Visit Number 2    Number of Visits 13    Date for PT Re-Evaluation 01/11/22    Authorization Type [employer]    PT Start Time 1749    PT Stop Time 1832    PT Time Calculation (min) 43 min    Equipment Utilized During Treatment Gait belt    Activity Tolerance Patient tolerated treatment well    Behavior During Therapy [name] for tasks assessed/performed             Past Medical History:  Diagnosis Date   Chronic kidney disease    stage 3    Chronic sphenoidal sinusitis    Degenerative arthritis of hip    requirde surgery 2009   Depression 2010   Diplopia 2011   in left lateral gaze    Diplopia    " my eyes dont track together, i have corrective prisms in my eye glasses"    Diverticulosis 2006   noted in a colonoscopy   Esophageal reflux 2013   Hypertension    IGT (impaired glucose tolerance) 2011   denies hx of diabetes    Insomnia 2009   Mixed hyperlipidemia 2000   Osteoarthritis    Osteopenia 2005   Other and unspecified hyperlipidemia 2000   Other specified disease of sebaceous glands    Palpitations 2009   secondary to PSVT   PNA (pneumonia) 2014   Psoriasis 2004   PSVT (paroxysmal supraventricular tachycardia) (HCC) 2009   "report it has been some time since i had that "    Situational stress    Venous insufficiency since 2017    Past Surgical History:  Procedure Laterality Date   CATARACT EXTRACTION W/ INTRAOCULAR LENS  IMPLANT, BILATERAL     COLONOSCOPY  01/30/2010   Jarold Motto   ETHMOIDECTOMY Left 11/17/2020   Procedure: LEFT SIDED TOTAL ETHMOIDECTOMY;  Surgeon: Drema Halon, MD;  Location: [facility];  Service: ENT;  Laterality: Left;   EYE SURGERY     HEMORRHOID SURGERY     JOINT REPLACEMENT     LASIK     left medial and superior rectus muscle recesion for diplopia correction     SINUS ENDO WITH FUSION Left 11/17/2020   Procedure: SINUS ENDOSCOPY WITH FUSION NAVIGATION;  Surgeon: Drema Halon, MD;  Location: [facility];  Service: ENT;  Laterality: Left;   SPHENOIDECTOMY Left 11/17/2020   Procedure: LEFT SIDED SPHENOIDECTOMY WITH TISSUE REMOVAL;  Surgeon: Drema Halon, MD;  Location: [facility];  Service: ENT;  Laterality: Left;   surgery of right eye rectus muscles for diplpia correction   2019   TOTAL HIP ARTHROPLASTY Right 2010   TOTAL HIP ARTHROPLASTY Left 08/19/2019   Procedure: TOTAL HIP ARTHROPLASTY ANTERIOR APPROACH;  Surgeon: Ollen Gross, MD;  Location: [facility];  Service: Orthopedics;  Laterality: Left;    TURBINATE REDUCTION Left 11/17/2020   Procedure: LEFT SIDED TURBINATE REDUCTION;  Surgeon: Drema Halon, MD;  Location: [facility];  Service: ENT;  Laterality: Left;   URETHRAL SLING     midurethral sling with TVT Exact and  Cystoscopy    There were no vitals filed for this visit.   Subjective Assessment - 10/17/21 1750     Subjective No changes, no falls.    Pertinent History PMH: PD (diagnosed 2022), osteopenia, osteoarthritis, diplopia left lateral gaze since 2011 -had surgery in about ~2019 (has corrective prisms but haven't been wearing them), HLD, CKD stage III    Limitations Walking    Diagnostic tests DAT Scan 02/09/2021:  Marked decreased activity within the LEFT and RIGHT striatum.  Slightly greater deficit on the RIGHT. This pattern can be found in  Parkinsonian syndromes pathology.    Patient Stated Goals increase strength so she can walk a mile, balance on one leg for 20 seconds.    Currently in Pain? No/denies                 [facility] PT Assessment - 10/17/21 1751       6 Minute Walk- Baseline   6 Minute Walk- Baseline yes    BP (mmHg) 133/75    HR (bpm) 84    02 Sat (%RA) 99 %    Modified Borg Scale for Dyspnea 0- Nothing at all      6 Minute walk- Post Test   6 Minute Walk Post Test yes    BP (mmHg) 143/77    HR (bpm) 75    Modified Borg Scale for Dyspnea 0- Nothing at all      6 minute walk test results    Aerobic Endurance Distance Walked 1239    Endurance additional comments 1st lap: 212', last lap: 9'                           [facility] Adult PT Treatment/Exercise - 10/17/21 1805       Ambulation/Gait   Ambulation/Gait Yes    Ambulation/Gait Assistance 5: Supervision    Ambulation/Gait Assistance Details x2 laps with therapist helping to faciliate arm swing esp LUE with walking poles and cues for incr step length/height. Removed walking poles and cues to keep arm swing going    Ambulation Distance (Feet) 1239 Feet   during 6MWT, plus an additional 460'   Assistive device None    Gait Pattern Step-through pattern;Decreased arm swing - left;Decreased trunk rotation;Poor foot clearance - left;Poor foot clearance - right    Ambulation Surface Level;Indoor               Pt performs PWR! Moves in standing position at edge of mat table with chair in front for balance as needed.    PWR! Up for improved posture 2 x 10 reps, cues for scapular retraction and elbow extension.    PWR! Rock for improved weighshifting x10 reps weight shifting through hips, then 2 x 10 reps with reaching and looking up at hands    PWR! Twist for improved trunk rotation x5 reps each side, cues to reset with tall posture in the middle before twisting to other side.    PWR! Step for improved step initiation 2x10 reps each side, cues for incr foot clearance.   Cues provided for technique, relation to function, and intensity - trying to work towards a 7/10. Provided handout for HEP.      PT  Education - 10/17/21 2022     Education Details Changed one of patient's appts next week to an earlier time (did not want to come in at 5:45 due to driving in the dark). Mentioned OT referral for  PD/education and pt reporting micrographia - PT would like to hold off at this time. Initial standing PWR moves for HEP.    Person(s) Educated Patient    Methods Explanation;Handout    Comprehension Verbalized understanding              PT Short Term Goals - 10/17/21 2030       PT SHORT TERM GOAL #1   Title Pt will be independent with initial HEP in order to build upon functional gains made in therapy. ALL STGS DUE 11/05/21    Time 3    Period Weeks    Status New    Target Date 11/05/21      PT SHORT TERM GOAL #2   Title Pt will undergo assessment of 6MWT with LTG written as appropriate.    Baseline 1239' on 10/17/21    Time 3    Period Weeks    Status Achieved      PT SHORT TERM GOAL #3   Title Pt will improve gait speed to at least 3.3 ft/sec in order to demo improved community mobility.    Time 3    Period Weeks    Status New               PT Long Term Goals - 10/17/21 2030       PT LONG TERM GOAL #1   Title Pt will be independent with final HEP in order to build upon functional gains made in therapy. ALL LTGS DUE 11/26/21    Time 6    Period Weeks    Status New      PT LONG TERM GOAL #2   Title Pt will verbalize understanding of local Parkinson's disease resources, including options for continue community fitness.    Time 6    Period Weeks    Status New      PT LONG TERM GOAL #3   Title Pt will improve 6MWT to at least 1300' in order to demo improved gait efficiency and endurance for community mobility.    Baseline 1239'    Time 6    Period Weeks    Status Revised      PT LONG TERM GOAL #4   Title Pt will improve TUG and TUG cog score to less than or equal to 10% difference for improved dual task/decreased fall risk.    Time 6    Period Weeks    Status  New      PT LONG TERM GOAL #5   Title Pt will improve SLS time on RLE and LLE to at least 10 seconds for improved balance for IADLs.    Baseline 2.35 seconds RLE, 4.81 seconds LLE    Time 6    Period Weeks    Status New                   Plan - 10/17/21 2031     Clinical Impression Statement Performed 6MWT today with pt ambulating 1239' - noted decr LUE arm swing and decr foot clearance. Updated LTG as appropriate. Initiated standing PWR moves today for HEP with pt performing well. Worked on gait training with using walking poles to facilitate arm swing, pt demonstrating good carryover afterwards. Will continue to progress towards LTGs.    Personal Factors and Comorbidities Comorbidity 3+;Time since onset of injury/illness/exacerbation;Past/Current Experience    Comorbidities PD (diagnosed 2022), osteopenia, osteoarthritis, diplopia left lateral gaze since 2011 -had surgery in about ~2019 (has corrective  prisms but haven't been wearing them), HLD, CKD stage III    Examination-Activity Limitations Locomotion Level    Examination-Participation Restrictions Community Activity   fly fishing   Stability/Clinical Decision Making Stable/Uncomplicated    Rehab Potential Good    PT Frequency 2x / week    PT Duration 12 weeks    PT Treatment/Interventions ADLs/Self Care Home Management;Gait training;Stair training;Therapeutic activities;Functional mobility training;Therapeutic exercise;DME Instruction;Balance training;Neuromuscular re-education;Patient/family education;Vestibular    PT Next Visit Plan review standing PWR moves. gait training with arm swing and step length. SLS, dual tasking, lateral stepping strategies. balance on unlevel surfaces.    Consulted and Agree with Plan of Care Patient             Patient will benefit from skilled therapeutic intervention in order to improve the following deficits and impairments:  Abnormal gait, Decreased activity tolerance, Decreased  balance, Decreased endurance, Decreased coordination, Decreased strength, Impaired flexibility, Postural dysfunction  Visit Diagnosis: Unsteadiness on feet  Other abnormalities of gait and mobility     Problem List Patient Active Problem List   Diagnosis Date Noted   Parkinson's disease ([place]) 10/08/2021   Anxiety 10/08/2021   Depression 10/08/2021   OA (osteoarthritis) of hip 08/19/2019   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

## 2021-10-23 ENCOUNTER — Ambulatory Visit: Payer: Medicare PPO | Admitting: Physical Therapy

## 2021-10-24 ENCOUNTER — Ambulatory Visit: Payer: Medicare PPO | Admitting: Physical Therapy

## 2021-10-26 ENCOUNTER — Ambulatory Visit: Payer: Medicare PPO

## 2021-10-26 ENCOUNTER — Other Ambulatory Visit: Payer: Self-pay

## 2021-10-26 DIAGNOSIS — R29818 Other symptoms and signs involving the nervous system: Secondary | ICD-10-CM

## 2021-10-26 DIAGNOSIS — R2681 Unsteadiness on feet: Secondary | ICD-10-CM | POA: Diagnosis not present

## 2021-10-26 DIAGNOSIS — R2689 Other abnormalities of gait and mobility: Secondary | ICD-10-CM

## 2021-10-26 NOTE — Therapy (Signed)
[facility] [address] [place], [place], [address] Phone: [phone]   Fax:  [phone]  Physical Therapy Treatment  Patient Details  Name: Marie Ford MRN: [id number] Date of Birth: [birth year] Referring Provider (PT): Dr. Jaynee Eagles   Encounter Date: 10/26/2021   PT End of Session - 10/26/21 1531     Visit Number 3    Number of Visits 13    Date for PT Re-Evaluation 01/11/22    Authorization Type [employer]    PT Start Time [id number]    PT Stop Time 1613    PT Time Calculation (min) 42 min    Equipment Utilized During Treatment Gait belt    Activity Tolerance Patient tolerated treatment well    Behavior During Therapy [facility] for tasks assessed/performed             Past Medical History:  Diagnosis Date   Chronic kidney disease    stage 3    Chronic sphenoidal sinusitis    Degenerative arthritis of hip    requirde surgery 2009   Depression 2010   Diplopia 2011   in left lateral gaze    Diplopia    " my eyes dont track together, i have corrective prisms in my eye glasses"    Diverticulosis 2006   noted in a colonoscopy   Esophageal reflux 2013   Hypertension    IGT (impaired glucose tolerance) 2011   denies hx of diabetes    Insomnia 2009   Mixed hyperlipidemia 2000   Osteoarthritis    Osteopenia 2005   Other and unspecified hyperlipidemia 2000   Other specified disease of sebaceous glands    Palpitations 2009   secondary to PSVT   PNA (pneumonia) 2014   Psoriasis 2004   PSVT (paroxysmal supraventricular tachycardia) ([place]) 2009   "report it has been some time since i had that "    Situational stress    Venous insufficiency since 2017    Past Surgical History:  Procedure Laterality Date   CATARACT EXTRACTION W/ INTRAOCULAR LENS  IMPLANT, BILATERAL     COLONOSCOPY  01/30/2010   Sharlett Iles   ETHMOIDECTOMY Left 11/17/2020   Procedure: LEFT SIDED TOTAL ETHMOIDECTOMY;  Surgeon: Rozetta Nunnery, MD;  Location: [facility];  Service: ENT;  Laterality: Left;   EYE SURGERY     HEMORRHOID SURGERY     JOINT REPLACEMENT     LASIK     left medial and superior rectus muscle recesion for diplopia correction     SINUS ENDO WITH FUSION Left 11/17/2020   Procedure: SINUS ENDOSCOPY WITH FUSION NAVIGATION;  Surgeon: Rozetta Nunnery, MD;  Location: [facility];  Service: ENT;  Laterality: Left;   SPHENOIDECTOMY Left 11/17/2020   Procedure: LEFT SIDED SPHENOIDECTOMY WITH TISSUE REMOVAL;  Surgeon: Rozetta Nunnery, MD;  Location: [facility];  Service: ENT;  Laterality: Left;   surgery of right eye rectus muscles for diplpia correction   2019   TOTAL HIP ARTHROPLASTY Right 2010   TOTAL HIP ARTHROPLASTY Left 08/19/2019   Procedure: TOTAL HIP ARTHROPLASTY ANTERIOR APPROACH;  Surgeon: Gaynelle Arabian, MD;  Location: [facility];  Service: Orthopedics;  Laterality: Left;  138min   TURBINATE REDUCTION Left 11/17/2020   Procedure: LEFT SIDED TURBINATE REDUCTION;  Surgeon: Rozetta Nunnery, MD;  Location: [facility];  Service: ENT;  Laterality: Left;   URETHRAL SLING     midurethral sling with TVT Exact and  Cystoscopy    There were no vitals filed for this visit.   Subjective Assessment - 10/26/21 1533     Subjective No new changes/complaints. No falls to report. Reports the PWR moves are going well, reports being trying to complete 1-2x/daily. Reports that she has been antibotic for sinuses, but it has upset her stomach. Going to reach out and see if there is another option.    Pertinent History PMH: PD (diagnosed 2022), osteopenia, osteoarthritis, diplopia left lateral gaze since 2011 -had surgery in about ~2019 (has corrective prisms but haven't been wearing them), HLD, CKD stage III    Limitations Walking    Diagnostic tests DAT Scan 02/09/2021:  Marked decreased activity within the LEFT and RIGHT striatum.  Slightly greater  deficit on the RIGHT. This pattern can be found in  Parkinsonian syndromes pathology.    Patient Stated Goals increase strength so she can walk a mile, balance on one leg for 20 seconds.    Currently in Pain? No/denies              [facility] Adult PT Treatment/Exercise - 10/26/21 0001       Transfers   Transfers Sit to Stand;Stand to Sit    Sit to Stand 5: Supervision    Stand to Sit 5: Supervision      Ambulation/Gait   Ambulation/Gait Yes    Ambulation/Gait Assistance 5: Supervision    Ambulation/Gait Assistance Details completed ambulation x 500 ft with use of walking sticks to faciliate improved arm swing, and cues for improved step length. then completed ambulation x 230 ft without to demo improved carryover with improved arm swing, but still noticeable decrease in LUE > RUE.    Ambulation Distance (Feet) 500 Feet   x1, 230 x 1   Assistive device None    Gait Pattern Step-through pattern;Decreased arm swing - left;Decreased trunk rotation;Poor foot clearance - left;Poor foot clearance - right    Ambulation Surface Level;Indoor      Neuro Re-ed    Neuro Re-ed Details  Standing on airex without UE support: completed alteranting anterior stepping strategy x 10 reps, then completed posterior stepping strategy x 10 reps with focus on step length. supervision. Then completed standing mini squats x 10 reps with focus on powering up into upright position and scapular retraction upon return to standing to promote upright posture.              Reviewed Standing PWR Moves for HEP:   Pt performs PWR! Moves in standing position at edge of mat table:    PWR! Up for improved posture 2 x 10 reps, cues for scapular retraction.    PWR! Rock for improved weighshifting 2 x 10 reps with cues for reaching and looking up at hands    PWR! Twist for improved trunk rotation x 10 reps each side, cues to reset with tall posture in the middle before twisting to other side. Intermittent resets to  allow for proper completion as patient got off sequence.   PWR! Step for improved step initiation 2x10 reps each side, cues for incr foot clearance with return to midline   Cues provided for technique, relation to function, and intensity - trying to work towards a 7/10.     Balance Exercises - 10/26/21 0001       Balance Exercises: Standing   Other Standing Exercises standing on blue mat on incline: completed wide BOS > narrow BOS with eyes closed 2 x 30 seconds. then with staggered  stance EO x 30 seconds each, then progressed to EC 2 x 10-15 seconds. CGA with eyes closed. more challenge noted with staggered stance.                  PT Short Term Goals - 10/17/21 2030       PT SHORT TERM GOAL #1   Title Pt will be independent with initial HEP in order to build upon functional gains made in therapy. ALL STGS DUE 11/05/21    Time 3    Period Weeks    Status New    Target Date 11/05/21      PT SHORT TERM GOAL #2   Title Pt will undergo assessment of with LTG written as appropriate.    Baseline 1239' on 10/17/21    Time 3    Period Weeks    Status Achieved      PT SHORT TERM GOAL #3   Title Pt will improve gait speed to at least 3.3 ft/sec in order to demo improved community mobility.    Time 3    Period Weeks    Status New               PT Long Term Goals - 10/17/21 2030       PT LONG TERM GOAL #1   Title Pt will be independent with final HEP in order to build upon functional gains made in therapy. ALL LTGS DUE 11/26/21    Time 6    Period Weeks    Status New      PT LONG TERM GOAL #2   Title Pt will verbalize understanding of local Parkinson's disease resources, including options for continue community fitness.    Time 6    Period Weeks    Status New      PT LONG TERM GOAL #3   Title Pt will improve to at least 1300' in order to demo improved gait efficiency and endurance for community mobility.    Baseline 1239'    Time 6    Period Weeks     Status Revised      PT LONG TERM GOAL #4   Title Pt will improve TUG and TUG cog score to less than or equal to 10% difference for improved dual task/decreased fall risk.    Time 6    Period Weeks    Status New      PT LONG TERM GOAL #5   Title Pt will improve SLS time on RLE and LLE to at least 10 seconds for improved balance for IADLs.    Baseline 2.35 seconds RLE, 4.81 seconds LLE    Time 6    Period Weeks    Status New                   Plan - 10/26/21 1549     Clinical Impression Statement Today's skilled PT session included review of standing PWR moves, with cues continud to be required for proper completion. Continued gait training focused on step length and arm swing, as well as a initiating stepping strategies. Patient tolerating well. Wll continue to progress toward all LTGs.    Personal Factors and Comorbidities Comorbidity 3+;Time since onset of injury/illness/exacerbation;Past/Current Experience    Comorbidities PD (diagnosed 2022), osteopenia, osteoarthritis, diplopia left lateral gaze since 2011 -had surgery in about ~2019 (has corrective prisms but haven't been wearing them), HLD, CKD stage III    Examination-Activity Limitations Locomotion Level  Examination-Participation Restrictions Community Activity   fly fishing   Stability/Clinical Decision Making Stable/Uncomplicated    Rehab Potential Good    PT Frequency 2x / week    PT Duration 12 weeks    PT Treatment/Interventions ADLs/Self Care Home Management;Gait training;Stair training;Therapeutic activities;Functional mobility training;Therapeutic exercise;DME Instruction;Balance training;Neuromuscular re-education;Patient/family education;Vestibular    PT Next Visit Plan continue standing PWR moves. gait training with arm swing and step length, utilization of walking stick to faciliate. SLS, dual tasking, lateral stepping strategies. balance on unlevel surfaces.    Consulted and Agree with Plan of Care  Patient             Patient will benefit from skilled therapeutic intervention in order to improve the following deficits and impairments:  Abnormal gait, Decreased activity tolerance, Decreased balance, Decreased endurance, Decreased coordination, Decreased strength, Impaired flexibility, Postural dysfunction  Visit Diagnosis: Unsteadiness on feet  Other abnormalities of gait and mobility  Other symptoms and signs involving the nervous system     Problem List Patient Active Problem List   Diagnosis Date Noted   Parkinson's disease ([place]) 10/08/2021   Anxiety 10/08/2021   Depression 10/08/2021   OA (osteoarthritis) of hip 08/19/2019   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 [place], [place], [address] Phone: [phone]   Fax:  [phone]  Name: Marie Ford MRN: [id number] Date of Birth: 1946/09/07

## 2021-10-31 ENCOUNTER — Other Ambulatory Visit: Payer: Self-pay

## 2021-10-31 ENCOUNTER — Encounter: Payer: Self-pay | Admitting: Physical Therapy

## 2021-10-31 ENCOUNTER — Ambulatory Visit: Payer: Medicare PPO | Admitting: Physical Therapy

## 2021-10-31 DIAGNOSIS — R2681 Unsteadiness on feet: Secondary | ICD-10-CM

## 2021-10-31 DIAGNOSIS — R29818 Other symptoms and signs involving the nervous system: Secondary | ICD-10-CM

## 2021-10-31 DIAGNOSIS — R2689 Other abnormalities of gait and mobility: Secondary | ICD-10-CM

## 2021-10-31 NOTE — Therapy (Signed)
[facility] [address] [place], [place], [address] Phone: [phone]   Fax:  [phone]  Physical Therapy Treatment  Patient Details  Name: Marie Ford MRN: [id number] Date of Birth: 1946-06-04 Referring Provider (PT): Dr. Jaynee Eagles   Encounter Date: 10/31/2021   PT End of Session - 10/31/21 1620     Visit Number 4    Number of Visits 13    Date for PT Re-Evaluation 01/11/22    Authorization Type [employer]- 12 visits approved 10/13/21 - 01/11/22    PT Start Time 1618    PT Stop Time 1658    PT Time Calculation (min) 40 min    Equipment Utilized During Treatment Gait belt    Activity Tolerance Patient tolerated treatment well    Behavior During Therapy [facility] for tasks assessed/performed             Past Medical History:  Diagnosis Date   Chronic kidney disease    stage 3    Chronic sphenoidal sinusitis    Degenerative arthritis of hip    requirde surgery 2009   Depression 2010   Diplopia 2011   in left lateral gaze    Diplopia    " my eyes dont track together, i have corrective prisms in my eye glasses"    Diverticulosis 2006   noted in a colonoscopy   Esophageal reflux 2013   Hypertension    IGT (impaired glucose tolerance) 2011   denies hx of diabetes    Insomnia 2009   Mixed hyperlipidemia 2000   Osteoarthritis    Osteopenia 2005   Other and unspecified hyperlipidemia 2000   Other specified disease of sebaceous glands    Palpitations 2009   secondary to PSVT   PNA (pneumonia) 2014   Psoriasis 2004   PSVT (paroxysmal supraventricular tachycardia) ([place]) 2009   "report it has been some time since i had that "    Situational stress    Venous insufficiency since 2017    Past Surgical History:  Procedure Laterality Date   CATARACT EXTRACTION W/ INTRAOCULAR LENS  IMPLANT, BILATERAL     COLONOSCOPY  01/30/2010   Sharlett Iles   ETHMOIDECTOMY Left 11/17/2020   Procedure: LEFT SIDED TOTAL  ETHMOIDECTOMY;  Surgeon: Rozetta Nunnery, MD;  Location: [place];  Service: ENT;  Laterality: Left;   EYE SURGERY     HEMORRHOID SURGERY     JOINT REPLACEMENT     LASIK     left medial and superior rectus muscle recesion for diplopia correction     SINUS ENDO WITH FUSION Left 11/17/2020   Procedure: SINUS ENDOSCOPY WITH FUSION NAVIGATION;  Surgeon: Rozetta Nunnery, MD;  Location: [place];  Service: ENT;  Laterality: Left;   SPHENOIDECTOMY Left 11/17/2020   Procedure: LEFT SIDED SPHENOIDECTOMY WITH TISSUE REMOVAL;  Surgeon: Rozetta Nunnery, MD;  Location: [facility];  Service: ENT;  Laterality: Left;   surgery of right eye rectus muscles for diplpia correction   2019   TOTAL HIP ARTHROPLASTY Right 2010   TOTAL HIP ARTHROPLASTY Left 08/19/2019   Procedure: TOTAL HIP ARTHROPLASTY ANTERIOR APPROACH;  Surgeon: Gaynelle Arabian, MD;  Location: [facility];  Service: Orthopedics;  Laterality: Left;  157min   TURBINATE REDUCTION Left 11/17/2020   Procedure: LEFT SIDED TURBINATE REDUCTION;  Surgeon: Rozetta Nunnery, MD;  Location: [place];  Service: ENT;  Laterality: Left;   URETHRAL SLING  midurethral sling with TVT Exact and Cystoscopy    There were no vitals filed for this visit.   Subjective Assessment - 10/31/21 1622     Subjective Doing her exercises every day and they are going well. No falls.    Pertinent History PMH: PD (diagnosed 2022), osteopenia, osteoarthritis, diplopia left lateral gaze since 2011 -had surgery in about ~2019 (has corrective prisms but haven't been wearing them), HLD, CKD stage III    Limitations Walking    Diagnostic tests DAT Scan 02/09/2021:  Marked decreased activity within the LEFT and RIGHT striatum.  Slightly greater deficit on the RIGHT. This pattern can be found in  Parkinsonian syndromes pathology.    Patient Stated Goals increase strength so she can walk a mile, balance on one  leg for 20 seconds.    Currently in Pain? No/denies   "no pain, just fatigue"                                   Balance Exercises - 10/31/21 1639       Balance Exercises: Standing   SLS with Vectors Foam/compliant surface;Limitations    SLS with Vectors Limitations On air ex; alternating cone taps x10 reps, then cross body x10 reps each side, intermittent UE support. Incr difficulty on LLE.    Rockerboard Anterior/posterior;Limitations    Rockerboard Limitations Weight shifting x15 reps with cues for hip/ankle strategy. alternating posterior stepping off board without UE support x10 reps each side.    Marching Dynamic;Forwards;Limitations    Marching Limitations Down and back over 30' with cues for ROM and slowed and controlled for SLS.    Other Standing Exercises Staggered stance A/P weight shifting with cues for technique and weight shift then adding in full bilat arm swing with cued for full swing and open hands, initial incr difficulty sequencing but improved with incr reps.    Other Standing Exercises Comments On blue mat with wide BOS; reaching across body to grab bean bag and then taking a big lateral step and tossing into crate, cues for incr foot clearance and opening hand when throwing bean bag, x15 reps each side.            Standing PWR moves on air ex:    Pt performs PWR! Moves in standing position at edge of mat table:    PWR! Up for improved posture x10 reps, cues for scapular retraction.    PWR! Rock for improved weighshifting x10 reps with cues for reaching and looking up at hands    PWR! Twist for improved trunk rotation x 10 reps each side, cues to reset with tall posture in the middle before twisting to opposite side.    PWR! Step for improved step initiation x10 reps each side, cues for incr foot clearance with return to midline, incr difficulty when performing on foam.         PT Short Term Goals - 10/17/21 2030       PT SHORT  TERM GOAL #1   Title Pt will be independent with initial HEP in order to build upon functional gains made in therapy. ALL STGS DUE 11/05/21    Time 3    Period Weeks    Status New    Target Date 11/05/21      PT SHORT TERM GOAL #2   Title Pt will undergo assessment of 6MWT with LTG written as appropriate.  Baseline 1239' on 10/17/21    Time 3    Period Weeks    Status Achieved      PT SHORT TERM GOAL #3   Title Pt will improve gait speed to at least 3.3 ft/sec in order to demo improved community mobility.    Time 3    Period Weeks    Status New               PT Long Term Goals - 10/17/21 2030       PT LONG TERM GOAL #1   Title Pt will be independent with final HEP in order to build upon functional gains made in therapy. ALL LTGS DUE 11/26/21    Time 6    Period Weeks    Status New      PT LONG TERM GOAL #2   Title Pt will verbalize understanding of local Parkinson's disease resources, including options for continue community fitness.    Time 6    Period Weeks    Status New      PT LONG TERM GOAL #3   Title Pt will improve 6MWT to at least 1300' in order to demo improved gait efficiency and endurance for community mobility.    Baseline 1239'    Time 6    Period Weeks    Status Revised      PT LONG TERM GOAL #4   Title Pt will improve TUG and TUG cog score to less than or equal to 10% difference for improved dual task/decreased fall risk.    Time 6    Period Weeks    Status New      PT LONG TERM GOAL #5   Title Pt will improve SLS time on RLE and LLE to at least 10 seconds for improved balance for IADLs.    Baseline 2.35 seconds RLE, 4.81 seconds LLE    Time 6    Period Weeks    Status New                   Plan - 10/31/21 1720     Clinical Impression Statement Pt challenged by lateral stepping strategies on compliant surfaces today, esp with taking a bigger step back to midline with LLE. Worked on balance on unlevel surfaces and weight  shifting/SLS tasks. Pt with incr difficulty with SLS on LLE. Will continue to progress towards LTGs.    Personal Factors and Comorbidities Comorbidity 3+;Time since onset of injury/illness/exacerbation;Past/Current Experience    Comorbidities PD (diagnosed 2022), osteopenia, osteoarthritis, diplopia left lateral gaze since 2011 -had surgery in about ~2019 (has corrective prisms but haven't been wearing them), HLD, CKD stage III    Examination-Activity Limitations Locomotion Level    Examination-Participation Restrictions Community Activity   fly fishing   Stability/Clinical Decision Making Stable/Uncomplicated    Rehab Potential Good    PT Frequency 2x / week    PT Duration 12 weeks    PT Treatment/Interventions ADLs/Self Care Home Management;Gait training;Stair training;Therapeutic activities;Functional mobility training;Therapeutic exercise;DME Instruction;Balance training;Neuromuscular re-education;Patient/family education;Vestibular    PT Next Visit Plan check STGs. Marland Kitchenadd any balance on unlevel surfaces to HEP. gait training with arm swing and step length, utilization of walking stick to faciliate. and dynamic gait tasks. SLS, dual tasking, lateral stepping strategies. balance on unlevel surfaces.    PT Home Exercise Plan standing PWR.    Consulted and Agree with Plan of Care Patient  Patient will benefit from skilled therapeutic intervention in order to improve the following deficits and impairments:  Abnormal gait, Decreased activity tolerance, Decreased balance, Decreased endurance, Decreased coordination, Decreased strength, Impaired flexibility, Postural dysfunction  Visit Diagnosis: Unsteadiness on feet  Other abnormalities of gait and mobility  Other symptoms and signs involving the nervous system     Problem List Patient Active Problem List   Diagnosis Date Noted   Parkinson's disease (HCC) 10/08/2021   Anxiety 10/08/2021   Depression 10/08/2021   OA  (osteoarthritis) of hip 08/19/2019   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   Diverticular disease 07/10/2017   Gastroesophageal reflux disease 07/10/2017   Osteopenia 07/10/2017   Insomnia 06/03/2016   Essential hypertension 03/26/2014   Hyperlipidemia 03/01/2014   Other specified postprocedural states 03/01/2014   Exertional dyspnea 02/07/2014   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

## 2021-11-07 ENCOUNTER — Ambulatory Visit: Payer: Medicare PPO

## 2021-11-09 ENCOUNTER — Ambulatory Visit: Payer: Medicare PPO

## 2021-11-16 ENCOUNTER — Ambulatory Visit: Payer: Medicare PPO | Admitting: Physical Therapy

## 2021-11-20 ENCOUNTER — Other Ambulatory Visit: Payer: Self-pay | Admitting: Neurology

## 2021-11-21 ENCOUNTER — Ambulatory Visit: Payer: Medicare PPO

## 2021-11-23 ENCOUNTER — Ambulatory Visit: Payer: Medicare PPO | Attending: Family Medicine

## 2021-11-23 ENCOUNTER — Other Ambulatory Visit: Payer: Self-pay

## 2021-11-23 DIAGNOSIS — R29818 Other symptoms and signs involving the nervous system: Secondary | ICD-10-CM | POA: Diagnosis present

## 2021-11-23 DIAGNOSIS — R2689 Other abnormalities of gait and mobility: Secondary | ICD-10-CM | POA: Diagnosis present

## 2021-11-23 DIAGNOSIS — R2681 Unsteadiness on feet: Secondary | ICD-10-CM | POA: Diagnosis not present

## 2021-11-23 NOTE — Therapy (Signed)
[facility] [address] , [place], [address] Phone: [phone]   Fax:  [phone]  Physical Therapy Treatment  Patient Details  Name: Marie Ford MRN: [id number] Date of Birth: [birth year] Referring Provider (PT): Dr. Jaynee Eagles   Encounter Date: 11/23/2021   PT End of Session - 11/23/21 1017     Visit Number 5    Number of Visits 13    Date for PT Re-Evaluation 01/11/22    Authorization Type [employer]- 12 visits approved 10/13/21 - 01/11/22    PT Start Time 1017    PT Stop Time 1059    PT Time Calculation (min) 42 min    Equipment Utilized During Treatment Gait belt    Activity Tolerance Patient tolerated treatment well    Behavior During Therapy [facility] for tasks assessed/performed             Past Medical History:  Diagnosis Date   Chronic kidney disease    stage 3    Chronic sphenoidal sinusitis    Degenerative arthritis of hip    requirde surgery 2009   Depression 2010   Diplopia 2011   in left lateral gaze    Diplopia    " my eyes dont track together, i have corrective prisms in my eye glasses"    Diverticulosis 2006   noted in a colonoscopy   Esophageal reflux 2013   Hypertension    IGT (impaired glucose tolerance) 2011   denies hx of diabetes    Insomnia 2009   Mixed hyperlipidemia 2000   Osteoarthritis    Osteopenia 2005   Other and unspecified hyperlipidemia 2000   Other specified disease of sebaceous glands    Palpitations 2009   secondary to PSVT   PNA (pneumonia) 2014   Psoriasis 2004   PSVT (paroxysmal supraventricular tachycardia) ([place]) 2009   "report it has been some time since i had that "    Situational stress    Venous insufficiency since 2017    Past Surgical History:  Procedure Laterality Date   CATARACT EXTRACTION W/ INTRAOCULAR LENS  IMPLANT, BILATERAL     COLONOSCOPY  01/30/2010   Sharlett Iles   ETHMOIDECTOMY Left 11/17/2020   Procedure: LEFT SIDED TOTAL  ETHMOIDECTOMY;  Surgeon: Rozetta Nunnery, MD;  Location: [place];  Service: ENT;  Laterality: Left;   EYE SURGERY     HEMORRHOID SURGERY     JOINT REPLACEMENT     LASIK     left medial and superior rectus muscle recesion for diplopia correction     SINUS ENDO WITH FUSION Left 11/17/2020   Procedure: SINUS ENDOSCOPY WITH FUSION NAVIGATION;  Surgeon: Rozetta Nunnery, MD;  Location: [facility];  Service: ENT;  Laterality: Left;   SPHENOIDECTOMY Left 11/17/2020   Procedure: LEFT SIDED SPHENOIDECTOMY WITH TISSUE REMOVAL;  Surgeon: Rozetta Nunnery, MD;  Location: [facility];  Service: ENT;  Laterality: Left;   surgery of right eye rectus muscles for diplpia correction   2019   TOTAL HIP ARTHROPLASTY Right 2010   TOTAL HIP ARTHROPLASTY Left 08/19/2019   Procedure: TOTAL HIP ARTHROPLASTY ANTERIOR APPROACH;  Surgeon: Gaynelle Arabian, MD;  [facility];  Service: Orthopedics;  Laterality: Left;  1108mn   TURBINATE REDUCTION Left 11/17/2020   Procedure: LEFT SIDED TURBINATE REDUCTION;  Surgeon: NRozetta Nunnery MD;  Location[place] [facility]  Service: ENT;  Laterality: Left;   URETHRAL SLING  midurethral sling with TVT Exact and Cystoscopy    There were no vitals filed for this visit.   Subjective Assessment - 11/23/21 1019     Subjective Reports is starting to feel slighty better. Still feel weak. No falls. Reports due to the vomitting and nausea, and lack of eating she lost approx 6 lbs.    Pertinent History PMH: PD (diagnosed 2022), osteopenia, osteoarthritis, diplopia left lateral gaze since 2011 -had surgery in about ~2019 (has corrective prisms but haven't been wearing them), HLD, CKD stage III    Limitations Walking    Diagnostic tests DAT Scan 02/09/2021:  Marked decreased activity within the LEFT and RIGHT striatum.  Slightly greater deficit on the RIGHT. This pattern can be found in  Parkinsonian syndromes  pathology.    Patient Stated Goals increase strength so she can walk a mile, balance on one leg for 20 seconds.    Currently in Pain? No/denies                [facility] PT Assessment - 11/23/21 0001       Ambulation/Gait   Ambulation/Gait Assistance Details with 6MWT    Ambulation Distance (Feet) --   see 6MWT test     6 Minute Walk- Baseline   6 Minute Walk- Baseline yes    BP (mmHg) 111/73    HR (bpm) 87    Modified Borg Scale for Dyspnea 0- Nothing at all      6 Minute walk- Post Test   6 Minute Walk Post Test yes    BP (mmHg) 115/68    HR (bpm) 91    Modified Borg Scale for Dyspnea 0- Nothing at all    Perceived Rate of Exertion (Borg) 9- very light      6 minute walk test results    Aerobic Endurance Distance Walked 872              [facility] Adult PT Treatment/Exercise - 11/23/21 0001       Transfers   Transfers Sit to Stand;Stand to Sit    Sit to Stand 5: Supervision    Stand to Sit 5: Supervision      Ambulation/Gait   Ambulation/Gait Yes    Ambulation/Gait Assistance 5: Supervision    Assistive device None    Gait Pattern Step-through pattern;Decreased arm swing - left;Decreased trunk rotation;Poor foot clearance - left;Poor foot clearance - right    Ambulation Surface Level;Indoor    Gait velocity 9.34 seconds = 3.51 ft/sec      Neuro Re-ed    Neuro Re-ed Details  Assessed SLS, RLE: 5 seconds avg. LLE: 14.9 seconds avg. Reviewed and added to HEP              Balance Exercises - 11/23/21 0001       Balance Exercises: Standing   SLS with Vectors Foam/compliant surface;Limitations    SLS with Vectors Limitations On airex; alternating taps x10 reps to first step then x 10 reps to second step. imceased challenge with RLE noted today. Added to HEP.            Reviewed and Updated HEP:  Access Code: [email or web address] URL: [email or web address] Date: 11/23/2021 Prepared by: Baldomero Lamy  Exercises Single Leg Stance - 1 x daily - 5 x  weekly - 1 sets - 3 reps - 10-15 seconds hold Step Taps on Low Step - 1 x daily - 5 x weekly - 2 sets - 10 reps     PT Education -  11/23/21 1057     Education Details progress toward STGs, progress toward LTGs.  Updated HEP    Person(s) Educated Patient    Methods Explanation;Demonstration;Handout    Comprehension Verbalized understanding              PT Short Term Goals - 11/23/21 1040       PT SHORT TERM GOAL #1   Title Pt will be independent with initial HEP in order to build upon functional gains made in therapy. ALL STGS DUE 11/05/21    Baseline reviewed HEP and updated    Time 3    Period Weeks    Status Achieved    Target Date 11/05/21      PT SHORT TERM GOAL #2   Title Pt will undergo assessment of 6MWT with LTG written as appropriate.    Baseline 1239' on 10/17/21    Time 3    Period Weeks    Status Achieved      PT SHORT TERM GOAL #3   Title Pt will improve gait speed to at least 3.3 ft/sec in order to demo improved community mobility.    Baseline 3.51 ft/sec    Time 3    Period Weeks    Status Achieved               PT Long Term Goals - 11/23/21 1045       PT LONG TERM GOAL #1   Title Pt will be independent with final HEP in order to build upon functional gains made in therapy. ALL LTGS DUE 11/26/21    Time 6    Period Weeks    Status New      PT LONG TERM GOAL #2   Title Pt will verbalize understanding of local Parkinson's disease resources, including options for continue community fitness.    Time 6    Period Weeks    Status New      PT LONG TERM GOAL #3   Title Pt will improve 6MWT to at least 1300' in order to demo improved gait efficiency and endurance for community mobility.    Baseline 1239'; 872 ft on 12/15 (recent sickness explain regression)    Time 6    Period Weeks    Status Not Met      PT LONG TERM GOAL #4   Title Pt will improve TUG and TUG cog score to less than or equal to 10% difference for improved dual  task/decreased fall risk.    Time 6    Period Weeks    Status New      PT LONG TERM GOAL #5   Title Pt will improve SLS time on RLE and LLE to at least 10 seconds for improved balance for IADLs.    Baseline 2.35 seconds RLE, 4.81 seconds LLE; RLE: 5 seconds avg. LLE: 14.9 seconds avg    Time 6    Period Weeks    Status Partially Met                   Plan - 11/23/21 1100     Clinical Impression Statement Assessed STGs with patient able to meet all STGs at this time, began to assess progress toward LTGs as well. Patient demonstrating improvemetns in SLS, especially noted on LLE continue difficutly with RLE. With 6MWT patient demonstrating reduced ambulation distance but increased fatigue/weakness due to recent weakness causing patient to miss approx 2-3 weeks of therapy. Will plan to finish assesment of patient  progress toward LTG at next visit.    Personal Factors and Comorbidities Comorbidity 3+;Time since onset of injury/illness/exacerbation;Past/Current Experience    Comorbidities PD (diagnosed 2022), osteopenia, osteoarthritis, diplopia left lateral gaze since 2011 -had surgery in about ~2019 (has corrective prisms but haven't been wearing them), HLD, CKD stage III    Examination-Activity Limitations Locomotion Level    Examination-Participation Restrictions Community Activity   fly fishing   Stability/Clinical Decision Making Stable/Uncomplicated    Rehab Potential Good    PT Frequency 2x / week    PT Duration 12 weeks    PT Treatment/Interventions ADLs/Self Care Home Management;Gait training;Stair training;Therapeutic activities;Functional mobility training;Therapeutic exercise;DME Instruction;Balance training;Neuromuscular re-education;Patient/family education;Vestibular    PT Next Visit Plan finish checking LTGs and re-cert .add any balance on unlevel surfaces to HEP. gait training with arm swing and step length, utilization of walking stick to faciliate. and dynamic gait  tasks. SLS, dual tasking, lateral stepping strategies. balance on unlevel surfaces.    PT Home Exercise Plan standing PWR.    Consulted and Agree with Plan of Care Patient             Patient will benefit from skilled therapeutic intervention in order to improve the following deficits and impairments:  Abnormal gait, Decreased activity tolerance, Decreased balance, Decreased endurance, Decreased coordination, Decreased strength, Impaired flexibility, Postural dysfunction  Visit Diagnosis: Unsteadiness on feet  Other abnormalities of gait and mobility  Other symptoms and signs involving the nervous system     Problem List Patient Active Problem List   Diagnosis Date Noted   Parkinson's disease ([place]) 10/08/2021   Anxiety 10/08/2021   Depression 10/08/2021   OA (osteoarthritis) of hip 08/19/2019   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 [place], [place], [address] Phone: [phone]   Fax:  [phone]  Name: Marie Ford MRN: [id number] Date of Birth: 03-04-1946

## 2021-11-23 NOTE — Patient Instructions (Signed)
Access Code: [email or web address] URL: [email or web address] Date: 11/23/2021 Prepared by: Jethro Bastos  Exercises Single Leg Stance - 1 x daily - 5 x weekly - 1 sets - 3 reps - 10-15 seconds hold Step Taps on Low Step - 1 x daily - 5 x weekly - 2 sets - 10 reps

## 2021-11-28 ENCOUNTER — Ambulatory Visit: Payer: Medicare PPO | Admitting: Physical Therapy

## 2021-11-28 ENCOUNTER — Other Ambulatory Visit: Payer: Self-pay

## 2021-11-28 ENCOUNTER — Other Ambulatory Visit: Payer: Self-pay | Admitting: Neurology

## 2021-11-28 ENCOUNTER — Telehealth: Payer: Self-pay | Admitting: Physical Therapy

## 2021-11-28 ENCOUNTER — Encounter: Payer: Self-pay | Admitting: Physical Therapy

## 2021-11-28 DIAGNOSIS — R29818 Other symptoms and signs involving the nervous system: Secondary | ICD-10-CM

## 2021-11-28 DIAGNOSIS — R2681 Unsteadiness on feet: Secondary | ICD-10-CM

## 2021-11-28 DIAGNOSIS — R2689 Other abnormalities of gait and mobility: Secondary | ICD-10-CM

## 2021-11-28 DIAGNOSIS — R413 Other amnesia: Secondary | ICD-10-CM

## 2021-11-28 NOTE — Telephone Encounter (Signed)
Dr. [name],  Marie Ford has been seen by PT at [facility]. The patient would benefit from a speech therapy evaluation for cognitive impairments and pt reporting difficulties with memory.    If you agree, please place an order in [facility] workque in [facility] or fax the order to [phone]. Thank you, Sherlie Ban, PT, DPT 11/28/21 1:59 PM    [facility] [address] [place], [place]  [address] Phone:  [phone] Fax:  [phone]

## 2021-11-28 NOTE — Therapy (Signed)
[facility] [address] [place], [place], [address] Phone: [phone]   Fax:  [phone]  Physical Therapy Treatment  Patient Details  Name: Marie Ford MRN: [id number] Date of Birth: [birth year] Referring Provider (PT): Dr. Jaynee Eagles   Encounter Date: 11/28/2021   PT End of Session - 11/28/21 1020     Visit Number 6    Number of Visits 13    Date for PT Re-Evaluation 01/11/22    Authorization Type [employer]- 12 visits approved 10/13/21 - 01/11/22    PT Start Time 63    PT Stop Time 1100    PT Time Calculation (min) 41 min    Equipment Utilized During Treatment Gait belt    Activity Tolerance Patient tolerated treatment well    Behavior During Therapy [facility] for tasks assessed/performed             Past Medical History:  Diagnosis Date   Chronic kidney disease    stage 3    Chronic sphenoidal sinusitis    Degenerative arthritis of hip    requirde surgery 2009   Depression 2010   Diplopia 2011   in left lateral gaze    Diplopia    " my eyes dont track together, i have corrective prisms in my eye glasses"    Diverticulosis 2006   noted in a colonoscopy   Esophageal reflux 2013   Hypertension    IGT (impaired glucose tolerance) 2011   denies hx of diabetes    Insomnia 2009   Mixed hyperlipidemia 2000   Osteoarthritis    Osteopenia 2005   Other and unspecified hyperlipidemia 2000   Other specified disease of sebaceous glands    Palpitations 2009   secondary to PSVT   PNA (pneumonia) 2014   Psoriasis 2004   PSVT (paroxysmal supraventricular tachycardia) ([place]) 2009   "report it has been some time since i had that "    Situational stress    Venous insufficiency since 2017    Past Surgical History:  Procedure Laterality Date   CATARACT EXTRACTION W/ INTRAOCULAR LENS  IMPLANT, BILATERAL     COLONOSCOPY  01/30/2010   Sharlett Iles   ETHMOIDECTOMY Left 11/17/2020   Procedure: LEFT SIDED TOTAL  ETHMOIDECTOMY;  Surgeon: Rozetta Nunnery, MD;  Location: [facility];  Service: ENT;  Laterality: Left;   EYE SURGERY     HEMORRHOID SURGERY     JOINT REPLACEMENT     LASIK     left medial and superior rectus muscle recesion for diplopia correction     SINUS ENDO WITH FUSION Left 11/17/2020   Procedure: SINUS ENDOSCOPY WITH FUSION NAVIGATION;  Surgeon: Rozetta Nunnery, MD;  Location: [facility];  Service: ENT;  Laterality: Left;   SPHENOIDECTOMY Left 11/17/2020   Procedure: LEFT SIDED SPHENOIDECTOMY WITH TISSUE REMOVAL;  Surgeon: Rozetta Nunnery, MD;  Location: [facility];  Service: ENT;  Laterality: Left;   surgery of right eye rectus muscles for diplpia correction   2019   TOTAL HIP ARTHROPLASTY Right 2010   TOTAL HIP ARTHROPLASTY Left 08/19/2019   Procedure: TOTAL HIP ARTHROPLASTY ANTERIOR APPROACH;  Surgeon: Gaynelle Arabian, MD;  Location: [facility];  Service: Orthopedics;  Laterality: Left;  121mn   TURBINATE REDUCTION Left 11/17/2020   Procedure: LEFT SIDED TURBINATE REDUCTION;  Surgeon: NRozetta Nunnery MD;  Location[place] [facility]  Service: ENT;  Laterality: Left;   URETHRAL SLING  midurethral sling with TVT Exact and Cystoscopy    There were no vitals filed for this visit.   Subjective Assessment - 11/28/21 1020     Subjective Nothing new since she was last here. Has been working on her balance exercises at home.    Pertinent History PMH: PD (diagnosed 2022), osteopenia, osteoarthritis, diplopia left lateral gaze since 2011 -had surgery in about ~2019 (has corrective prisms but haven't been wearing them), HLD, CKD stage III    Limitations Walking    Diagnostic tests DAT Scan 02/09/2021:  Marked decreased activity within the LEFT and RIGHT striatum.  Slightly greater deficit on the RIGHT. This pattern can be found in  Parkinsonian syndromes pathology.    Patient Stated Goals increase strength so she can walk  a mile, balance on one leg for 20 seconds.    Currently in Pain? No/denies                [facility] PT Assessment - 11/28/21 1034       Timed Up and Go Test   Normal TUG (seconds) 7.06    Cognitive TUG (seconds) 8.34                           [facility] Adult PT Treatment/Exercise - 11/28/21 1401       Therapeutic Activites    Therapeutic Activities Other Therapeutic Activities    Other Therapeutic Activities Discussed with pt areas to continue working on with therapy based on checking goals (dual tasking, high level balance, and working on [employer] moves with pt being potentially interested in [employer] moves class). Pt also reporting difficulties with cognition/memory, discussed possibility of a speech therapy eval here at this location, which pt was interested in. Will request from Dr. Jaynee Eagles. Also discussed best practice at this clinic for PD - when finished with PT/ST will have pt get scheduled PT/OT/ST screens in 6 months. Pt in agreement with this plan.                 Balance Exercises - 11/28/21 1101       Balance Exercises: Standing   Step Over Hurdles / Cones Stepping over 4 larger orange obstacles with alternating pattern down and back x2 reps, then adding in cognitive challenge for an additional down and back x3 reps with naming foods in alphabetical order from A-Z, pt with incr challenge, needing cues for naming, and pt going a lot slower or performing with step to vs. Alternating    Other Standing Exercises SLS tasks - using stepping stones for SLS tasks, down and back x5 reps, with cued for slowed and controlled, intermittent UE support                PT Education - 11/28/21 1354     Education Details See therapeutic activity section above, adding more visits (due to pt missing some due to being sick), provided handout on local community PD resources.    Person(s) Educated Patient    Methods Explanation;Handout    Comprehension Verbalized  understanding              PT Short Term Goals - 11/28/21 1404       PT SHORT TERM GOAL #1   Title ALL STGS = LTGS               PT Long Term Goals - 11/28/21 1037       PT LONG TERM GOAL #1  Title Pt will be independent with final HEP in order to build upon functional gains made in therapy. ALL LTGS DUE 11/26/21    Baseline pt reports performing HEP consistently with standing PWR/standing balance, will benefit from further review/updates.    Time 6    Period Weeks    Status On-going      PT LONG TERM GOAL #2   Title Pt will verbalize understanding of local Parkinson's disease resources, including options for continue community fitness.    Baseline provided resources on 11/28/21    Time 6    Period Weeks    Status Achieved      PT LONG TERM GOAL #3   Title Pt will improve 6MWT to at least 1300' in order to demo improved gait efficiency and endurance for community mobility.    Baseline 1239'; 872 ft on 12/15 (recent sickness explain regression)    Time 6    Period Weeks    Status Not Met      PT LONG TERM GOAL #4   Title Pt will improve TUG and TUG cog score to less than or equal to 10% difference for improved dual task/decreased fall risk.    Baseline 7.06 TUG, cog TUG 8.34 (however improvement from 12.66 seconds)    Time 6    Period Weeks    Status Not Met      PT LONG TERM GOAL #5   Title Pt will improve SLS time on RLE and LLE to at least 10 seconds for improved balance for IADLs.    Baseline 2.35 seconds RLE, 4.81 seconds LLE; RLE: 5 seconds avg. LLE: 14.9 seconds avg    Time 6    Period Weeks    Status Partially Met             Ongoing/updated LTGs:     PT Long Term Goals - 11/28/21 1419       PT LONG TERM GOAL #1   Title Pt will be independent with final HEP in order to build upon functional gains made in therapy. ALL LTGS DUE 12/26/21    Baseline pt reports performing HEP consistently with standing PWR/standing balance, will benefit from  further review/updates.    Time 4    Period Weeks    Status On-going    Target Date 12/26/21      PT LONG TERM GOAL #2   Title Pt will verbalize understanding of transition to appropriate community fitness classes upon d/c from PT to maximize gains made in therapy.    Time 4    Period Weeks    Status New      PT LONG TERM GOAL #3   Title Pt will improve 6MWT to at least 1250' in order to demo improved gait efficiency and endurance for community mobility.    Baseline 1239'; 872 ft on 12/15 (recent sickness explain regression)    Time 4    Period Weeks    Status Revised      PT LONG TERM GOAL #4   Title Pt will improve TUG and TUG cog score to less than or equal to 10% difference for improved dual task/decreased fall risk.    Baseline 7.06 TUG, cog TUG 8.34 (however improvement from 12.66 seconds)    Time 4    Period Weeks    Status On-going      PT LONG TERM GOAL #5   Title Pt will improve SLS time on RLE and LLE to at least 10 seconds  for improved balance for IADLs.    Baseline 2.35 seconds RLE, 4.81 seconds LLE; RLE: 5 seconds avg. LLE: 14.9 seconds avg    Time 4    Period Weeks    Status On-going                Plan - 11/28/21 1414     Clinical Impression Statement Today's skilled session focused on checking remainder of pt's LTGs. Pt has met 2 out of 5 LTGs in regards to current HEP and providing community resources to pt today. Pt with improvement of cog TUG to 8.34 seconds (previously 12.66 seconds), but not a less than 10% difference in regards to TUG, indicating continued difficulty with dual tasking. Will need to add more visits due to pt missing some due to being sick. Pt has had a decr with her endurance with 6MWT due to being fatigued. LTGs updated/revised as appropriate in POC.    Personal Factors and Comorbidities Comorbidity 3+;Time since onset of injury/illness/exacerbation;Past/Current Experience    Comorbidities PD (diagnosed 2022), osteopenia,  osteoarthritis, diplopia left lateral gaze since 2011 -had surgery in about ~2019 (has corrective prisms but haven't been wearing them), HLD, CKD stage III    Examination-Activity Limitations Locomotion Level    Examination-Participation Restrictions Community Activity   fly fishing   Stability/Clinical Decision Making Stable/Uncomplicated    Rehab Potential Good    PT Frequency 2x / week    PT Duration 12 weeks    PT Treatment/Interventions ADLs/Self Care Home Management;Gait training;Stair training;Therapeutic activities;Functional mobility training;Therapeutic exercise;DME Instruction;Balance training;Neuromuscular re-education;Patient/family education;Vestibular    PT Next Visit Plan add any balance on unlevel surfaces to HEP.  try different PWR moves as pt is intersted in going to the class. gait training with arm swing and step length. SLS esp with RLE, dual tasking during gait/balance. lateral stepping strategies. balance on unlevel surfaces.    PT Home Exercise Plan standing PWR.    Consulted and Agree with Plan of Care Patient             Patient will benefit from skilled therapeutic intervention in order to improve the following deficits and impairments:  Abnormal gait, Decreased activity tolerance, Decreased balance, Decreased endurance, Decreased coordination, Decreased strength, Impaired flexibility, Postural dysfunction  Visit Diagnosis: Unsteadiness on feet  Other symptoms and signs involving the nervous system  Other abnormalities of gait and mobility     Problem List Patient Active Problem List   Diagnosis Date Noted   Parkinson's disease ([place]) 10/08/2021   Anxiety 10/08/2021   Depression 10/08/2021   OA (osteoarthritis) of hip 08/19/2019   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 [place], [place], [address] Phone: [phone]   Fax:  [phone]  Name: Marie Ford MRN: [id number] Date of Birth: Aug 17, 1946

## 2021-11-30 ENCOUNTER — Ambulatory Visit: Payer: Medicare PPO

## 2021-12-08 ENCOUNTER — Other Ambulatory Visit: Payer: Self-pay

## 2021-12-08 ENCOUNTER — Ambulatory Visit: Payer: Medicare PPO | Admitting: Physical Therapy

## 2021-12-08 ENCOUNTER — Encounter: Payer: Self-pay | Admitting: Physical Therapy

## 2021-12-08 DIAGNOSIS — R2689 Other abnormalities of gait and mobility: Secondary | ICD-10-CM

## 2021-12-08 DIAGNOSIS — R29818 Other symptoms and signs involving the nervous system: Secondary | ICD-10-CM

## 2021-12-08 DIAGNOSIS — R2681 Unsteadiness on feet: Secondary | ICD-10-CM | POA: Diagnosis not present

## 2021-12-08 NOTE — Therapy (Addendum)
[facility] [address] [place], [place], [address] Phone: [phone]   Fax:  [phone]  Physical Therapy Treatment  Patient Details  Name: Marie Ford MRN: [id number] Date of Birth: [birth year] Referring Provider (PT): Dr. Jaynee Eagles   Encounter Date: 12/08/2021   PT End of Session - 12/08/21 1319     Visit Number 7    Number of Visits 13    Date for PT Re-Evaluation 01/11/22    Authorization Type [employer]- 12 visits approved 10/13/21 - 01/11/22    PT Start Time [id number]    PT Stop Time [id number]    PT Time Calculation (min) 41 min    Equipment Utilized During Treatment Gait belt    Activity Tolerance Patient tolerated treatment well    Behavior During Therapy [facility] for tasks assessed/performed             Past Medical History:  Diagnosis Date   Chronic kidney disease    stage 3    Chronic sphenoidal sinusitis    Degenerative arthritis of hip    requirde surgery 2009   Depression 2010   Diplopia 2011   in left lateral gaze    Diplopia    " my eyes dont track together, i have corrective prisms in my eye glasses"    Diverticulosis 2006   noted in a colonoscopy   Esophageal reflux 2013   Hypertension    IGT (impaired glucose tolerance) 2011   denies hx of diabetes    Insomnia 2009   Mixed hyperlipidemia 2000   Osteoarthritis    Osteopenia 2005   Other and unspecified hyperlipidemia 2000   Other specified disease of sebaceous glands    Palpitations 2009   secondary to PSVT   PNA (pneumonia) 2014   Psoriasis 2004   PSVT (paroxysmal supraventricular tachycardia) ([place]) 2009   "report it has been some time since i had that "    Situational stress    Venous insufficiency since 2017    Past Surgical History:  Procedure Laterality Date   CATARACT EXTRACTION W/ INTRAOCULAR LENS  IMPLANT, BILATERAL     COLONOSCOPY  01/30/2010   Sharlett Iles   ETHMOIDECTOMY Left 11/17/2020   Procedure: LEFT SIDED TOTAL  ETHMOIDECTOMY;  Surgeon: Rozetta Nunnery, MD;  Location: [facility];  Service: ENT;  Laterality: Left;   EYE SURGERY     HEMORRHOID SURGERY     JOINT REPLACEMENT     LASIK     left medial and superior rectus muscle recesion for diplopia correction     SINUS ENDO WITH FUSION Left 11/17/2020   Procedure: SINUS ENDOSCOPY WITH FUSION NAVIGATION;  Surgeon: Rozetta Nunnery, MD;  Location: [facility];  Service: ENT;  Laterality: Left;   SPHENOIDECTOMY Left 11/17/2020   Procedure: LEFT SIDED SPHENOIDECTOMY WITH TISSUE REMOVAL;  Surgeon: Rozetta Nunnery, MD;  Location: [facility];  Service: ENT;  Laterality: Left;   surgery of right eye rectus muscles for diplpia correction   2019   TOTAL HIP ARTHROPLASTY Right 2010   TOTAL HIP ARTHROPLASTY Left 08/19/2019   Procedure: TOTAL HIP ARTHROPLASTY ANTERIOR APPROACH;  Surgeon: Gaynelle Arabian, MD;  [facility];  Service: Orthopedics;  Laterality: Left;  18min   TURBINATE REDUCTION Left 11/17/2020   Procedure: LEFT SIDED TURBINATE REDUCTION;  Surgeon: Rozetta Nunnery, MD;  Location: [facility];  Service: ENT;  Laterality: Left;   URETHRAL SLING  midurethral sling with TVT Exact and Cystoscopy    There were no vitals filed for this visit.   Subjective Assessment - 12/08/21 1320     Subjective Will have to get surgery for her chronic sinus infections.    Pertinent History PMH: PD (diagnosed 2022), osteopenia, osteoarthritis, diplopia left lateral gaze since 2011 -had surgery in about ~2019 (has corrective prisms but haven't been wearing them), HLD, CKD stage III    Limitations Walking    Diagnostic tests DAT Scan 02/09/2021:  Marked decreased activity within the LEFT and RIGHT striatum.  Slightly greater deficit on the RIGHT. This pattern can be found in  Parkinsonian syndromes pathology.    Patient Stated Goals increase strength so she can walk a mile, balance on one leg  for 20 seconds.    Currently in Pain? No/denies                                    Balance Exercises - 12/08/21 1356       Balance Exercises: Standing   Standing Eyes Closed Foam/compliant surface;Limitations    Standing Eyes Closed Limitations Narrow BOS 3 x 30 seconds, head motions with feet hip width 2 x10 reps head turns, 2 x 10 reps head nods    SLS with Vectors Foam/compliant surface    SLS with Vectors Limitations On blue side of BOSU: alternating SLS and marching x10 reps each side, needing fingertip support, cues for tall posture    Stepping Strategy Lateral;Foam/compliant surface;10 reps;Limitations    Stepping Strategy Limitations Lateral stepping onto blue side of BOSU and marching other leg up, needing UE support, then stepping off of BOSU x10 reps each side    Other Standing Exercises On black side of BOSU: x10 reps mini squats, tried a couple without UE support, but pt needing to use fingertips on the bars              Pt performs PWR! Moves in modified quadruped position at countertop:    PWR! Up for improved posture x10 reps   PWR! Rock for improved weighshifting x10 reps   PWR! Twist for improved trunk rotation x5 reps each side   PWR! Step for improved step initiation x8 reps each side   Cues provided for technique, opening up hands, larger movement patterns. Added to pt's HEP    PT Education - 12/08/21 1359     Education Details Modified quadruped PWR moves added to HEP    Person(s) Educated Patient    Methods Explanation;Demonstration;Handout    Comprehension Verbalized understanding;Returned demonstration              PT Short Term Goals - 11/28/21 1404       PT SHORT TERM GOAL #1   Title ALL STGS = LTGS               PT Long Term Goals - 11/28/21 1419       PT LONG TERM GOAL #1   Title Pt will be independent with final HEP in order to build upon functional gains made in therapy. ALL LTGS DUE 12/26/21     Baseline pt reports performing HEP consistently with standing PWR/standing balance, will benefit from further review/updates.    Time 4    Period Weeks    Status On-going    Target Date 12/26/21      PT LONG TERM GOAL #2   Title  Pt will verbalize understanding of transition to appropriate community fitness classes upon d/c from PT to maximize gains made in therapy.    Time 4    Period Weeks    Status New      PT LONG TERM GOAL #3   Title Pt will improve 6MWT to at least 1250' in order to demo improved gait efficiency and endurance for community mobility.    Baseline 1239'; 872 ft on 12/15 (recent sickness explain regression)    Time 4    Period Weeks    Status Revised      PT LONG TERM GOAL #4   Title Pt will improve TUG and TUG cog score to less than or equal to 10% difference for improved dual task/decreased fall risk.    Baseline 7.06 TUG, cog TUG 8.34 (however improvement from 12.66 seconds)    Time 4    Period Weeks    Status On-going      PT LONG TERM GOAL #5   Title Pt will improve SLS time on RLE and LLE to at least 10 seconds for improved balance for IADLs.    Baseline 2.35 seconds RLE, 4.81 seconds LLE; RLE: 5 seconds avg. LLE: 14.9 seconds avg    Time 4    Period Weeks    Status On-going                   Plan - 12/08/21 1632     Clinical Impression Statement Performed modified quadruped at the countertop and added to pt's HEP, with pt performing well. Remainder of session focused on standing balance strategies on BOSU/air ex working on SLS, vestibular input for balance, with pt needing UE support as needed esp with SLS tasks. Will continue to progress towards LTGs.    Personal Factors and Comorbidities Comorbidity 3+;Time since onset of injury/illness/exacerbation;Past/Current Experience    Comorbidities PD (diagnosed 2022), osteopenia, osteoarthritis, diplopia left lateral gaze since 2011 -had surgery in about ~2019 (has corrective prisms but haven't been  wearing them), HLD, CKD stage III    Examination-Activity Limitations Locomotion Level    Examination-Participation Restrictions Community Activity   fly fishing   Stability/Clinical Decision Making Stable/Uncomplicated    Rehab Potential Good    PT Frequency 2x / week    PT Duration 12 weeks    PT Treatment/Interventions ADLs/Self Care Home Management;Gait training;Stair training;Therapeutic activities;Functional mobility training;Therapeutic exercise;DME Instruction;Balance training;Neuromuscular re-education;Patient/family education;Vestibular    PT Next Visit Plan add any balance on unlevel surfaces to HEP.  try different PWR moves as pt is intersted in going to the class. gait training with arm swing and step length. SLS esp with RLE, dual tasking during gait/balance. lateral stepping strategies. balance on unlevel surfaces.    PT Home Exercise Plan standing PWR, mod quadruped PWR    Consulted and Agree with Plan of Care Patient             Patient will benefit from skilled therapeutic intervention in order to improve the following deficits and impairments:  Abnormal gait, Decreased activity tolerance, Decreased balance, Decreased endurance, Decreased coordination, Decreased strength, Impaired flexibility, Postural dysfunction  Visit Diagnosis: Unsteadiness on feet  Other symptoms and signs involving the nervous system  Other abnormalities of gait and mobility     Problem List Patient Active Problem List   Diagnosis Date Noted   Parkinson's disease ([place]) 10/08/2021   Anxiety 10/08/2021   Depression 10/08/2021   OA (osteoarthritis) of hip 08/19/2019   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   Diverticular disease 07/10/2017   Gastroesophageal reflux disease 07/10/2017   Osteopenia 07/10/2017   Insomnia 06/03/2016   Essential hypertension 03/26/2014   Hyperlipidemia 03/01/2014   Other specified postprocedural states 03/01/2014   Exertional dyspnea 02/07/2014   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

## 2021-12-12 ENCOUNTER — Ambulatory Visit: Payer: Medicare PPO | Admitting: Physical Therapy

## 2021-12-21 ENCOUNTER — Other Ambulatory Visit: Payer: Self-pay

## 2021-12-21 ENCOUNTER — Ambulatory Visit: Payer: Medicare PPO | Attending: Family Medicine

## 2021-12-21 DIAGNOSIS — R2689 Other abnormalities of gait and mobility: Secondary | ICD-10-CM | POA: Insufficient documentation

## 2021-12-21 DIAGNOSIS — R2681 Unsteadiness on feet: Secondary | ICD-10-CM | POA: Diagnosis not present

## 2021-12-21 DIAGNOSIS — R29818 Other symptoms and signs involving the nervous system: Secondary | ICD-10-CM | POA: Insufficient documentation

## 2021-12-21 NOTE — Therapy (Signed)
[facility] [address] [place], [place], [address] Phone: [phone]   Fax:  [phone]  Physical Therapy Treatment  Patient Details  Name: Marie Ford MRN: [id number] Date of Birth: [birth year] Referring Provider (PT): Dr. [name]   Encounter Date: 12/21/2021   PT End of Session - 12/21/21 1410     Visit Number 8    Number of Visits 13    Date for PT Re-Evaluation 01/11/22    Authorization Type [employer]- 12 visits approved 10/13/21 - 01/11/22    PT Start Time 1407   patient arriving late   PT Stop Time 1444    PT Time Calculation (min) 37 min    Equipment Utilized During Treatment Gait belt    Activity Tolerance Patient tolerated treatment well    Behavior During Therapy [facility] for tasks assessed/performed             Past Medical History:  Diagnosis Date   Chronic kidney disease    stage 3    Chronic sphenoidal sinusitis    Degenerative arthritis of hip    requirde surgery 2009   Depression 2010   Diplopia 2011   in left lateral gaze    Diplopia    " my eyes dont track together, i have corrective prisms in my eye glasses"    Diverticulosis 2006   noted in a colonoscopy   Esophageal reflux 2013   Hypertension    IGT (impaired glucose tolerance) 2011   denies hx of diabetes    Insomnia 2009   Mixed hyperlipidemia 2000   Osteoarthritis    Osteopenia 2005   Other and unspecified hyperlipidemia 2000   Other specified disease of sebaceous glands    Palpitations 2009   secondary to PSVT   PNA (pneumonia) 2014   Psoriasis 2004   PSVT (paroxysmal supraventricular tachycardia) (HCC) 2009   "report it has been some time since i had that "    Situational stress    Venous insufficiency since 2017    Past Surgical History:  Procedure Laterality Date   CATARACT EXTRACTION W/ INTRAOCULAR LENS  IMPLANT, BILATERAL     COLONOSCOPY  01/30/2010   Jarold Motto   ETHMOIDECTOMY Left 11/17/2020    Procedure: LEFT SIDED TOTAL ETHMOIDECTOMY;  Surgeon: Drema Halon, MD;  Location: [facility];  Service: ENT;  Laterality: Left;   EYE SURGERY     HEMORRHOID SURGERY     JOINT REPLACEMENT     LASIK     left medial and superior rectus muscle recesion for diplopia correction     SINUS ENDO WITH FUSION Left 11/17/2020   Procedure: SINUS ENDOSCOPY WITH FUSION NAVIGATION;  Surgeon: Drema Halon, MD;  Location: [facility];  Service: ENT;  Laterality: Left;   SPHENOIDECTOMY Left 11/17/2020   Procedure: LEFT SIDED SPHENOIDECTOMY WITH TISSUE REMOVAL;  Surgeon: Drema Halon, MD;  Location: [facility];  Service: ENT;  Laterality: Left;   surgery of right eye rectus muscles for diplpia correction   2019   TOTAL HIP ARTHROPLASTY Right 2010   TOTAL HIP ARTHROPLASTY Left 08/19/2019   Procedure: TOTAL HIP ARTHROPLASTY ANTERIOR APPROACH;  Surgeon: Ollen Gross, MD;  Location: [facility];  Service: Orthopedics;  Laterality: Left;    TURBINATE REDUCTION Left 11/17/2020   Procedure: LEFT SIDED TURBINATE REDUCTION;  Surgeon: Drema Halon, MD;  Location: [facility];  Service: ENT;  Laterality: Left;   URETHRAL SLING  midurethral sling with TVT Exact and Cystoscopy    There were no vitals filed for this visit.   Subjective Assessment - 12/21/21 1410     Subjective Patient reports had a couple days where she did not feel well, overall doing well. No falls.    Pertinent History PMH: PD (diagnosed 2022), osteopenia, osteoarthritis, diplopia left lateral gaze since 2011 -had surgery in about ~2019 (has corrective prisms but haven't been wearing them), HLD, CKD stage III    Limitations Walking    Diagnostic tests DAT Scan 02/09/2021:  Marked decreased activity within the LEFT and RIGHT striatum.  Slightly greater deficit on the RIGHT. This pattern can be found in  Parkinsonian syndromes pathology.    Patient Stated Goals  increase strength so she can walk a mile, balance on one leg for 20 seconds.    Currently in Pain? No/denies             Pt performs PWR! Moves in modified quadruped position at countertop:     PWR! Up for improved posture x10 reps    PWR! Rock for improved weighshifting x10 reps    PWR! Twist for improved trunk rotation x5 reps each side    PWR! Step for improved step initiation x8 reps each side    Cues provided for technique, opening up hands, larger movement patterns. Reviewed for HEP.      Balance Exercises - 12/21/21 0001       Balance Exercises: Standing   Standing Eyes Closed Foam/compliant surface;Limitations;Head turns;3 reps;30 secs    Standing Eyes Closed Limitations Narrow BOS 3 x 30 seconds, then progressed to addition of horizontal/vertical head turns x 10 reps each.    SLS with Vectors Foam/compliant surface    SLS with Vectors Limitations reviewed toe taps to 1st/2nd step on firm surface x 10 reps bilat, then in session progressed to standing on complaint (airex) x 10 reps bilat. Updated HEP.            Reviewed and Updated HEP:  Access Code: [email or web address] URL: [email or web address] Date: 12/21/2021 Prepared by: Baldomero Lamy  Exercises Single Leg Stance - 1 x daily - 5 x weekly - 1 sets - 3 reps - 10-15 seconds hold Step Taps on Low Step - 1 x daily - 5 x weekly - 2 sets - 10 reps - progressed to 1st set complete to 1st step, second set complete to 2nd step Romberg Stance Eyes Closed on Foam Pad - 1 x daily - 5 x weekly - 1 sets - 3 reps - 30 seconds hold Romberg Stance on Foam Pad with Head Rotation with Eyes Closed - 1 x daily - 5 x weekly - 1 sets - 10 reps Romberg Stance with Head Nods on Foam Pad with Eyes Closed - 1 x daily - 5 x weekly - 1 sets - 10 reps     PT Education - 12/21/21 1434     Education Details HEP Update    Person(s) Educated Patient    Methods Explanation;Demonstration;Handout    Comprehension Returned  demonstration;Verbalized understanding              PT Short Term Goals - 11/28/21 1404       PT SHORT TERM GOAL #1   Title ALL STGS = LTGS               PT Long Term Goals - 11/28/21 1419       PT LONG TERM GOAL #1   Title  Pt will be independent with final HEP in order to build upon functional gains made in therapy. ALL LTGS DUE 12/26/21    Baseline pt reports performing HEP consistently with standing PWR/standing balance, will benefit from further review/updates.    Time 4    Period Weeks    Status On-going    Target Date 12/26/21      PT LONG TERM GOAL #2   Title Pt will verbalize understanding of transition to appropriate community fitness classes upon d/c from PT to maximize gains made in therapy.    Time 4    Period Weeks    Status New      PT LONG TERM GOAL #3   Title Pt will improve 6MWT to at least 1250' in order to demo improved gait efficiency and endurance for community mobility.    Baseline 1239'; 872 ft on 12/15 (recent sickness explain regression)    Time 4    Period Weeks    Status Revised      PT LONG TERM GOAL #4   Title Pt will improve TUG and TUG cog score to less than or equal to 10% difference for improved dual task/decreased fall risk.    Baseline 7.06 TUG, cog TUG 8.34 (however improvement from 12.66 seconds)    Time 4    Period Weeks    Status On-going      PT LONG TERM GOAL #5   Title Pt will improve SLS time on RLE and LLE to at least 10 seconds for improved balance for IADLs.    Baseline 2.35 seconds RLE, 4.81 seconds LLE; RLE: 5 seconds avg. LLE: 14.9 seconds avg    Time 4    Period Weeks    Status On-going                   Plan - 12/21/21 1445     Clinical Impression Statement Today's session focused on review of modified quadruped PWR moves, with intermittent cues required. Then focused on balance on complaint surface and progressed HEP to include activites to promote improved vestibular input for balance. Patient  tolerating well. Will conitnue to progress toward all LTGs.    Personal Factors and Comorbidities Comorbidity 3+;Time since onset of injury/illness/exacerbation;Past/Current Experience    Comorbidities PD (diagnosed 2022), osteopenia, osteoarthritis, diplopia left lateral gaze since 2011 -had surgery in about ~2019 (has corrective prisms but haven't been wearing them), HLD, CKD stage III    Examination-Activity Limitations Locomotion Level    Examination-Participation Restrictions Community Activity   fly fishing   Stability/Clinical Decision Making Stable/Uncomplicated    Rehab Potential Good    PT Frequency 2x / week    PT Duration 12 weeks    PT Treatment/Interventions ADLs/Self Care Home Management;Gait training;Stair training;Therapeutic activities;Functional mobility training;Therapeutic exercise;DME Instruction;Balance training;Neuromuscular re-education;Patient/family education;Vestibular    PT Next Visit Plan how was new additions to HEP?  try different PWR moves as pt is intersted in going to the class. gait training with arm swing and step length. SLS esp with RLE, dual tasking during gait/balance. lateral stepping strategies. balance on unlevel surfaces.    PT Home Exercise Plan standing PWR, mod quadruped PWR    Consulted and Agree with Plan of Care Patient             Patient will benefit from skilled therapeutic intervention in order to improve the following deficits and impairments:  Abnormal gait, Decreased activity tolerance, Decreased balance, Decreased endurance, Decreased coordination, Decreased strength, Impaired flexibility,  Postural dysfunction  Visit Diagnosis: Unsteadiness on feet  Other symptoms and signs involving the nervous system  Other abnormalities of gait and mobility     Problem List Patient Active Problem List   Diagnosis Date Noted   Parkinson's disease ([place]) 10/08/2021   Anxiety 10/08/2021   Depression 10/08/2021   OA (osteoarthritis) of hip  08/19/2019   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

## 2021-12-21 NOTE — Patient Instructions (Signed)
Access Code: [email or web address] URL: [email or web address] Date: 12/21/2021 Prepared by: Jethro Bastos  Exercises Single Leg Stance - 1 x daily - 5 x weekly - 1 sets - 3 reps - 10-15 seconds hold Step Taps on Low Step - 1 x daily - 5 x weekly - 2 sets - 10 reps Romberg Stance Eyes Closed on Foam Pad - 1 x daily - 5 x weekly - 1 sets - 3 reps - 30 seconds hold Romberg Stance on Foam Pad with Head Rotation with Eyes Closed - 1 x daily - 5 x weekly - 1 sets - 10 reps Romberg Stance with Head Nods on Foam Pad with Eyes Closed - 1 x daily - 5 x weekly - 1 sets - 10 reps

## 2021-12-26 ENCOUNTER — Encounter: Payer: Self-pay | Admitting: Physical Therapy

## 2021-12-26 ENCOUNTER — Ambulatory Visit: Payer: Medicare PPO | Admitting: Physical Therapy

## 2021-12-26 ENCOUNTER — Other Ambulatory Visit: Payer: Self-pay

## 2021-12-26 DIAGNOSIS — R2689 Other abnormalities of gait and mobility: Secondary | ICD-10-CM

## 2021-12-26 DIAGNOSIS — R29818 Other symptoms and signs involving the nervous system: Secondary | ICD-10-CM

## 2021-12-26 DIAGNOSIS — R2681 Unsteadiness on feet: Secondary | ICD-10-CM

## 2021-12-26 NOTE — Therapy (Addendum)
[facility] [address] [place], [place], [address] Phone: [phone]   Fax:  [phone]  Physical Therapy Treatment  Patient Details  Name: Marie Ford MRN: [id number] Date of Birth: 07-12-1946 Referring Provider (PT): Dr. Jaynee Eagles   Encounter Date: 12/26/2021   PT End of Session - 12/26/21 1450     Visit Number 9    Number of Visits 13    Date for PT Re-Evaluation 01/11/22    Authorization Type [employer]- 12 visits approved 10/13/21 - 01/11/22    PT Start Time 1448    PT Stop Time 1093    PT Time Calculation (min) 42 min    Equipment Utilized During Treatment Gait belt    Activity Tolerance Patient tolerated treatment well    Behavior During Therapy [facility] for tasks assessed/performed             Past Medical History:  Diagnosis Date   Chronic kidney disease    stage 3    Chronic sphenoidal sinusitis    Degenerative arthritis of hip    requirde surgery 2009   Depression 2010   Diplopia 2011   in left lateral gaze    Diplopia    " my eyes dont track together, i have corrective prisms in my eye glasses"    Diverticulosis 2006   noted in a colonoscopy   Esophageal reflux 2013   Hypertension    IGT (impaired glucose tolerance) 2011   denies hx of diabetes    Insomnia 2009   Mixed hyperlipidemia 2000   Osteoarthritis    Osteopenia 2005   Other and unspecified hyperlipidemia 2000   Other specified disease of sebaceous glands    Palpitations 2009   secondary to PSVT   PNA (pneumonia) 2014   Psoriasis 2004   PSVT (paroxysmal supraventricular tachycardia) ([place]) 2009   "report it has been some time since i had that "    Situational stress    Venous insufficiency since 2017    Past Surgical History:  Procedure Laterality Date   CATARACT EXTRACTION W/ INTRAOCULAR LENS  IMPLANT, BILATERAL     COLONOSCOPY  01/30/2010   Sharlett Iles   ETHMOIDECTOMY Left 11/17/2020   Procedure: LEFT SIDED TOTAL  ETHMOIDECTOMY;  Surgeon: Rozetta Nunnery, MD;  Location: [facility];  Service: ENT;  Laterality: Left;   EYE SURGERY     HEMORRHOID SURGERY     JOINT REPLACEMENT     LASIK     left medial and superior rectus muscle recesion for diplopia correction     SINUS ENDO WITH FUSION Left 11/17/2020   Procedure: SINUS ENDOSCOPY WITH FUSION NAVIGATION;  Surgeon: Rozetta Nunnery, MD;  Location: [facility];  Service: ENT;  Laterality: Left;   SPHENOIDECTOMY Left 11/17/2020   Procedure: LEFT SIDED SPHENOIDECTOMY WITH TISSUE REMOVAL;  Surgeon: Rozetta Nunnery, MD;  Location: [facility];  Service: ENT;  Laterality: Left;   surgery of right eye rectus muscles for diplpia correction   2019   TOTAL HIP ARTHROPLASTY Right 2010   TOTAL HIP ARTHROPLASTY Left 08/19/2019   Procedure: TOTAL HIP ARTHROPLASTY ANTERIOR APPROACH;  Surgeon: Gaynelle Arabian, MD;  Location: [facility];  Service: Orthopedics;  Laterality: Left;  136mn   TURBINATE REDUCTION Left 11/17/2020   Procedure: LEFT SIDED TURBINATE REDUCTION;  Surgeon: NRozetta Nunnery MD;  Location: [facility]  Service: ENT;  Laterality: Left;   URETHRAL SLING  midurethral sling with TVT Exact and Cystoscopy    There were no vitals filed for this visit.   Subjective Assessment - 12/26/21 1451     Subjective Doing her PWR moves everyday. Has been working on her walking and reports improvement with her endurance. Moving soon to independent living at [place] - later this year (probs in April)    Pertinent History PMH: PD (diagnosed 2022), osteopenia, osteoarthritis, diplopia left lateral gaze since 2011 -had surgery in about ~2019 (has corrective prisms but haven't been wearing them), HLD, CKD stage III    Limitations Walking    Diagnostic tests DAT Scan 02/09/2021:  Marked decreased activity within the LEFT and RIGHT striatum.  Slightly greater deficit on the RIGHT. This pattern can be  found in  Parkinsonian syndromes pathology.    Patient Stated Goals increase strength so she can walk a mile, balance on one leg for 20 seconds.    Currently in Pain? No/denies                [facility] PT Assessment - 12/26/21 1457       6 Minute Walk- Baseline   6 Minute Walk- Baseline yes    Modified Borg Scale for Dyspnea 0- Nothing at all      6 Minute walk- Post Test   6 Minute Walk Post Test yes    Modified Borg Scale for Dyspnea 0- Nothing at all      6 minute walk test results    Aerobic Endurance Distance Walked 1265      Timed Up and Go Test   Normal TUG (seconds) 8.5    Cognitive TUG (seconds) 8.65                           [facility] Adult PT Treatment/Exercise - 12/26/21 1518       Transfers   Transfers Sit to Stand;Stand to Sit    Sit to Stand 6: Modified independent (Device/Increase time)    Five time sit to stand comments  10.90 seconds   with RLE staggered behind L   Stand to Sit 6: Modified independent (Device/Increase time)      Ambulation/Gait   Ambulation/Gait Yes    Ambulation/Gait Assistance 5: Supervision    Ambulation/Gait Assistance Details after 6MWT, cued pt to focus on LUE arm swing during gait and opening up hands and to focus on this when doing her walking at home. Pt demonstrating improvement when she focuses more on it during gait    Ambulation Distance (Feet) 230 Feet    Assistive device None    Gait Pattern Step-through pattern;Decreased arm swing - left;Decreased trunk rotation;Poor foot clearance - left;Poor foot clearance - right    Ambulation Surface Level;Indoor    Gait velocity 8.5 seconds = 3.85 ft/sec              Reviewed alternating step tap exercise to pt's HEP as pt reports her steps are outside. Discussed can perform at kitchen counter with opening bottom cabinet and working on slowed toe taps x10 reps each side.        PT Education - 12/26/21 1620     Education Details Will schedule for PD screens  for PT/OT/ST in 6 months at end of POC, adding one more visit to finalize and review HEP, results of LTGs    Person(s) Educated Patient    Methods Explanation    Comprehension Verbalized understanding  PT Short Term Goals - 11/28/21 1404       PT SHORT TERM GOAL #1   Title ALL STGS = LTGS               PT Long Term Goals - 12/26/21 1506       PT LONG TERM GOAL #1   Title Pt will be independent with final HEP in order to build upon functional gains made in therapy. ALL LTGS DUE 12/26/21    Baseline pt reports performing HEP consistently with standing PWR/standing balance, will benefit from further review/updates.    Time 4    Period Weeks    Status On-going    Target Date 12/26/21      PT LONG TERM GOAL #2   Title Pt will verbalize understanding of transition to appropriate community fitness classes upon d/c from PT to maximize gains made in therapy.    Baseline pt has handout, has not had time to look into community fitness due to being busy, pt reports performing Fitness fridays and other online activities from resources handout    Time 4    Period Weeks    Status Partially Met      PT LONG TERM GOAL #3   Title Pt will improve 6MWT to at least 1250' in order to demo improved gait efficiency and endurance for community mobility.    Baseline 872 ft on 12/15 (recent sickness explain regression); 1265' on 12/26/21    Time 4    Period Weeks    Status Achieved      PT LONG TERM GOAL #4   Title Pt will improve TUG and TUG cog score to less than or equal to 10% difference for improved dual task/decreased fall risk.    Baseline 8.5 TUG, cog TUG 8.65 seconds    Time 4    Period Weeks    Status Achieved      PT LONG TERM GOAL #5   Title Pt will improve SLS time on RLE and LLE to at least 10 seconds for improved balance for IADLs.    Baseline LLE; 16.81 seconds, 2.38 seconds RLE    Time 4    Period Weeks    Status Partially Met             Ongoing  LTG for 12 week POC:      PT Long Term Goals - 12/26/21 1626       PT LONG TERM GOAL #1   Title Pt will be independent with final HEP in order to build upon functional gains made in therapy. ALL LTGS DUE 01/09/22    Time 2    Period Weeks    Status New    Target Date 01/09/22               Plan - 12/26/21 1623     Clinical Impression Statement Today's skilled session focused on assessing pt's LTGs. Pt met LTGs #3 and #4. Improved 6MWT distance to 1265' (previously was 54' after being sick). Did need cues to focus on LUE arm swing and opening up hands. Pt met goal in regards to TUG/cog TUG indicating improved cog dual tasking. Pt improved SLS time with LLE, but still challenged with RLE and partially met LTG #5. Due to progress with therapy and results of LTGs, will plan to D/C at next session with PD screens scheduled in 6 months. Pt in agreement with this plan. LTG updated to reflect 12 week POC.  Personal Factors and Comorbidities Comorbidity 3+;Time since onset of injury/illness/exacerbation;Past/Current Experience    Comorbidities PD (diagnosed 2022), osteopenia, osteoarthritis, diplopia left lateral gaze since 2011 -had surgery in about ~2019 (has corrective prisms but haven't been wearing them), HLD, CKD stage III    Examination-Activity Limitations Locomotion Level    Examination-Participation Restrictions Community Activity   fly fishing   Stability/Clinical Decision Making Stable/Uncomplicated    Rehab Potential Good    PT Frequency 2x / week    PT Duration 12 weeks    PT Treatment/Interventions ADLs/Self Care Home Management;Gait training;Stair training;Therapeutic activities;Functional mobility training;Therapeutic exercise;DME Instruction;Balance training;Neuromuscular re-education;Patient/family education;Vestibular    PT Next Visit Plan review HEP and finalize, schedule pt for PD screens in 6 mo    PT Home Exercise Plan standing PWR, mod quadruped PWR    Consulted  and Agree with Plan of Care Patient             Patient will benefit from skilled therapeutic intervention in order to improve the following deficits and impairments:  Abnormal gait, Decreased activity tolerance, Decreased balance, Decreased endurance, Decreased coordination, Decreased strength, Impaired flexibility, Postural dysfunction  Visit Diagnosis: Unsteadiness on feet  Other symptoms and signs involving the nervous system  Other abnormalities of gait and mobility     Problem List Patient Active Problem List   Diagnosis Date Noted   Parkinson's disease ([place]) 10/08/2021   Anxiety 10/08/2021   Depression 10/08/2021   OA (osteoarthritis) of hip 08/19/2019   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

## 2021-12-28 ENCOUNTER — Ambulatory Visit: Payer: Medicare PPO

## 2021-12-28 ENCOUNTER — Encounter: Payer: Self-pay | Admitting: Speech Pathology

## 2022-01-02 ENCOUNTER — Ambulatory Visit: Payer: Medicare PPO

## 2022-01-07 ENCOUNTER — Encounter: Payer: Self-pay | Admitting: Neurology

## 2022-01-09 ENCOUNTER — Ambulatory Visit: Payer: Medicare PPO | Admitting: Physical Therapy

## 2022-01-15 ENCOUNTER — Ambulatory Visit: Payer: Medicare PPO | Admitting: Physical Therapy

## 2022-01-16 ENCOUNTER — Other Ambulatory Visit: Payer: Self-pay

## 2022-01-16 ENCOUNTER — Ambulatory Visit: Payer: Medicare PPO | Attending: Family Medicine | Admitting: Physical Therapy

## 2022-01-16 DIAGNOSIS — R29818 Other symptoms and signs involving the nervous system: Secondary | ICD-10-CM | POA: Insufficient documentation

## 2022-01-16 DIAGNOSIS — R2681 Unsteadiness on feet: Secondary | ICD-10-CM | POA: Insufficient documentation

## 2022-01-16 DIAGNOSIS — R2689 Other abnormalities of gait and mobility: Secondary | ICD-10-CM | POA: Diagnosis present

## 2022-01-16 NOTE — Patient Instructions (Signed)
FINAL HEP:  Standing PWR moves Modified Quadruped PWR moves  Access Code: [email or web address] URL: [email or web address] Date: 01/16/2022 Prepared by: Sherlie Ban  Exercises Single Leg Stance - 1 x daily - 5 x weekly - 1 sets - 3 reps - 10-15 seconds hold Step Taps on Low Step - 1 x daily - 5 x weekly - 2 sets - 10 reps Romberg Stance Eyes Closed on Foam Pad - 1 x daily - 5 x weekly - 1 sets - 3 reps - 30 seconds hold Romberg Stance on Foam Pad with Head Rotation with Eyes Closed - 1 x daily - 5 x weekly - 1 sets - 10 reps Romberg Stance with Head Nods on Foam Pad with Eyes Closed - 1 x daily - 5 x weekly - 1 sets - 10 reps

## 2022-01-16 NOTE — Therapy (Signed)
[facility] [address] [place], [place], [address] Phone: [phone]   Fax:  [phone]  Physical Therapy Treatment/10th Visit Progress Note/Re-Cert/Discharge Summary  Patient Details  Name: Marie Ford MRN: [id number] Date of Birth: 09/18/1946 Referring Provider (PT): Dr. Jaynee Eagles    10th Visit Physical Therapy Progress Note  Dates of Reporting Period: 10/13/21 to 01/16/22   Encounter Date: 01/16/2022   PT End of Session - 01/16/22 1148     Visit Number 10    Number of Visits 13    Date for PT Re-Evaluation 02/15/22    Authorization Type [employer]- 12 visits approved 10/13/21 - 01/11/22    PT Start Time 1146    PT Stop Time 9675   full time not used due to D/C visit   PT Time Calculation (min) 34 min    Equipment Utilized During Treatment --    Activity Tolerance Patient tolerated treatment well    Behavior During Therapy [facility] for tasks assessed/performed             Past Medical History:  Diagnosis Date   Chronic kidney disease    stage 3    Chronic sphenoidal sinusitis    Degenerative arthritis of hip    requirde surgery 2009   Depression 2010   Diplopia 2011   in left lateral gaze    Diplopia    " my eyes dont track together, i have corrective prisms in my eye glasses"    Diverticulosis 2006   noted in a colonoscopy   Esophageal reflux 2013   Hypertension    IGT (impaired glucose tolerance) 2011   denies hx of diabetes    Insomnia 2009   Mixed hyperlipidemia 2000   Osteoarthritis    Osteopenia 2005   Other and unspecified hyperlipidemia 2000   Other specified disease of sebaceous glands    Palpitations 2009   secondary to PSVT   PNA (pneumonia) 2014   Psoriasis 2004   PSVT (paroxysmal supraventricular tachycardia) ([place]) 2009   "report it has been some time since i had that "    Situational stress    Venous insufficiency since 2017    Past Surgical History:  Procedure Laterality  Date   CATARACT EXTRACTION W/ INTRAOCULAR LENS  IMPLANT, BILATERAL     COLONOSCOPY  01/30/2010   Sharlett Iles   ETHMOIDECTOMY Left 11/17/2020   Procedure: LEFT SIDED TOTAL ETHMOIDECTOMY;  Surgeon: Rozetta Nunnery, MD;  Location: [facility];  Service: ENT;  Laterality: Left;   EYE SURGERY     HEMORRHOID SURGERY     JOINT REPLACEMENT     LASIK     left medial and superior rectus muscle recesion for diplopia correction     SINUS ENDO WITH FUSION Left 11/17/2020   Procedure: SINUS ENDOSCOPY WITH FUSION NAVIGATION;  Surgeon: Rozetta Nunnery, MD;  Location: [facility];  Service: ENT;  Laterality: Left;   SPHENOIDECTOMY Left 11/17/2020   Procedure: LEFT SIDED SPHENOIDECTOMY WITH TISSUE REMOVAL;  Surgeon: Rozetta Nunnery, MD;  Location: [facility];  Service: ENT;  Laterality: Left;   surgery of right eye rectus muscles for diplpia correction   2019   TOTAL HIP ARTHROPLASTY Right 2010   TOTAL HIP ARTHROPLASTY Left 08/19/2019   Procedure: TOTAL HIP ARTHROPLASTY ANTERIOR APPROACH;  Surgeon: Gaynelle Arabian, MD;  Location: [facility];  Service: Orthopedics;  Laterality: Left;  188mn   TURBINATE REDUCTION Left 11/17/2020   Procedure: LEFT  SIDED TURBINATE REDUCTION;  Surgeon: Rozetta Nunnery, MD;  Location: [facility];  Service: ENT;  Laterality: Left;   URETHRAL SLING     midurethral sling with TVT Exact and Cystoscopy    There were no vitals filed for this visit.   Subjective Assessment - 01/16/22 1149     Subjective Doing her exercises at home. Tries to walk 1/2 mile everyday. Has had an upset stomach recently due to some changes they have made in her medication.    Pertinent History PMH: PD (diagnosed 2022), osteopenia, osteoarthritis, diplopia left lateral gaze since 2011 -had surgery in about ~2019 (has corrective prisms but haven't been wearing them), HLD, CKD stage III    Limitations Walking    Diagnostic tests DAT Scan  02/09/2021:  Marked decreased activity within the LEFT and RIGHT striatum.  Slightly greater deficit on the RIGHT. This pattern can be found in  Parkinsonian syndromes pathology.    Patient Stated Goals increase strength so she can walk a mile, balance on one leg for 20 seconds.    Currently in Pain? Yes   "just a little bit of muscle pulling in her left leg"               [facility] PT Assessment - 01/16/22 0001       Assessment   Medical Diagnosis PD    Referring Provider (PT) Dr. Jaynee Eagles    Onset Date/Surgical Date 10/05/21    Hand Dominance Right      Prior Function   Level of Independence Independent                           Pt performs PWR! Moves in standing position:   PWR! Up for improved posture x10 reps -cues to hold for a couple seconds in scapular retraction for postural awareness   PWR! Rock for improved weighshifting x10 reps each side  PWR! Twist for improved trunk rotation 2 x 5 reps each side - cues to slow down and reset in midline with a  big twist   PWR! Step for improved step initiation 2 x 5 reps each side - cues for looking out at hand, and big step with foot clearance esp when stepping back to midline.    Pt performs PWR! Moves in modified quadruped position:    PWR! Up for improved posture x10 reps   PWR! Rock for improved weighshifting x10 reps  PWR! Twist for improved trunk rotation x10 reps each side  PWR! Step for improved step initiation x10 reps each side   Pt reporting performing PWR moves daily and reporting intensity level as a 7/10.    Verbally reviewed HEP below for balance. Pt with no questions or concerns.  Access Code: [email or web address] URL: [email or web address] Date: 01/16/2022 Prepared by: Janann August  Exercises Single Leg Stance - 1 x daily - 5 x weekly - 1 sets - 3 reps - 10-15 seconds hold Step Taps on Low Step - 1 x daily - 5 x weekly - 2 sets - 10 reps Romberg Stance Eyes Closed on Foam Pad - 1  x daily - 5 x weekly - 1 sets - 3 reps - 30 seconds hold Romberg Stance on Foam Pad with Head Rotation with Eyes Closed - 1 x daily - 5 x weekly - 1 sets - 10 reps Romberg Stance with Head Nods on Foam Pad with Eyes Closed - 1 x daily - 5 x weekly -  1 sets - 10 reps     PT Education - 01/16/22 1229     Education Details Scheduling for PT/ST/OT screens in 6 months. If pt notices a decline before 6 months can also ask neurologist for an order for earlier than 6 months. Discussed and provided handout on optimal fitness program for PD after D/C. Reviewed community resources with pt with pt doing activities online at the [employer]. Pt to look more into community fitness options after she moves into an independent living facility (in April) - pt is currently too stressed and busy with moving tasks.    Person(s) Educated Patient    Methods Explanation;Handout    Comprehension Verbalized understanding            PHYSICAL THERAPY DISCHARGE SUMMARY  Visits from Start of Care: 10   Current functional level related to goals / functional outcomes: 6MWT 1265' TUG 8.5 seconds, cog TUG 8.75 seconds 5x sit <> stand: 10.90 seconds Gait speed: 3.85 ft/sec   Remaining deficits: Postural abnormalities, impaired high level balance.   Education / Equipment: HEP, community resources.   Patient agrees to discharge. Patient goals were met. Patient is being discharged due to meeting the stated rehab goals.    PT Short Term Goals - 11/28/21 1404       PT SHORT TERM GOAL #1   Title ALL STGS = LTGS               PT Long Term Goals - 01/16/22 1236       PT LONG TERM GOAL #1   Title Pt will be independent with final HEP in order to build upon functional gains made in therapy. ALL LTGS DUE 01/09/22    Time 2    Period Weeks    Status Achieved    Target Date 01/09/22                   Plan - 01/16/22 1236     Clinical Impression Statement Today's skilled  session focused on finalizing and reviewing pt's HEP with standing/mod quadruped PWR moves and standing balance. Pt needing intermittent cues at times to slow down standing PWR moves. Provided handout for optimal fitness program for PD after D/C. Pt is consistent with her HEP and her walking program. Plan to schedule PD screens in 6 months. Pt in agreement with plan and is agreeable to D/C.    Personal Factors and Comorbidities Comorbidity 3+;Time since onset of injury/illness/exacerbation;Past/Current Experience    Comorbidities PD (diagnosed 2022), osteopenia, osteoarthritis, diplopia left lateral gaze since 2011 -had surgery in about ~2019 (has corrective prisms but haven't been wearing them), HLD, CKD stage III    Examination-Activity Limitations Locomotion Level    Examination-Participation Restrictions Community Activity   fly fishing   Stability/Clinical Decision Making Stable/Uncomplicated    Rehab Potential Good    PT Frequency 2x / week    PT Duration 12 weeks    PT Treatment/Interventions ADLs/Self Care Home Management;Gait training;Stair training;Therapeutic activities;Functional mobility training;Therapeutic exercise;DME Instruction;Balance training;Neuromuscular re-education;Patient/family education;Vestibular    PT Home Exercise Plan standing PWR, mod quadruped PWR    Consulted and Agree with Plan of Care Patient             Patient will benefit from skilled therapeutic intervention in order to improve the following deficits and impairments:  Abnormal gait, Decreased activity tolerance, Decreased balance, Decreased endurance, Decreased coordination, Decreased strength, Impaired flexibility, Postural dysfunction  Visit Diagnosis: Unsteadiness on feet  Other abnormalities of gait and mobility  Other symptoms and signs involving the nervous system     Problem List Patient Active Problem List   Diagnosis Date Noted   Parkinson's disease ([place]) 10/08/2021   Anxiety  10/08/2021   Depression 10/08/2021   OA (osteoarthritis) of hip 08/19/2019   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 [place], [place], [address] Phone: [phone]   Fax:  [phone]  Name: Marie Ford MRN: [id number] Date of Birth: 1946-04-27

## 2022-01-18 ENCOUNTER — Ambulatory Visit: Payer: Medicare PPO | Admitting: Physical Therapy

## 2022-03-08 ENCOUNTER — Other Ambulatory Visit: Payer: Self-pay | Admitting: Neurology

## 2022-04-11 ENCOUNTER — Ambulatory Visit: Payer: Medicare PPO | Admitting: Neurology

## 2022-04-11 ENCOUNTER — Encounter: Payer: Self-pay | Admitting: Neurology

## 2022-04-11 VITALS — BP 118/69 | HR 79 | Ht 66.0 in | Wt 147.1 lb

## 2022-04-11 DIAGNOSIS — G20 Parkinson's disease: Secondary | ICD-10-CM | POA: Diagnosis not present

## 2022-04-11 MED ORDER — CARBIDOPA-LEVODOPA 25-100 MG PO TABS: 1.0000 | ORAL_TABLET | Freq: Three times a day (TID) | ORAL | 5 refills | Status: DC

## 2022-04-11 NOTE — Progress Notes (Signed)
?[name] ? ? ? ?Provider:  Dr Jaynee Eagles ?Requesting Provider: Derinda Late, MD ?Primary Care Provider:  Derinda Late, MD ? ?CC:  Headache, memory loss, gait abnormality ? ?04/11/2022: she moved back to [place] permanently, her dog passed away and this hurt her heart. She is moving into [employer]. She gets very tired easily, she has fatigue even if she had a good night's sleep, she has dry mouth in the morning, do not feel refreshed but also don't fall asleep during the day or nap so unlikely. BP is great.  She had a comprehensive examination 2 weeks ago annual and had blood work completed. She thinks stress is a lot of it and she had a sinus surgery and she is healing. She exercises 20 minutes a day. No falls. She went to physical therapy and she is doing all the exercises. She walks. She feels better after resting. Tremors return after 4-5 hours and that is when she takes a sinemet 730, 1230, 530-6.  The medicine might make her a little tired. She is also on alprazolam at night and takes it for stress during the day and that may make her tired as well. She has chronic diplopia and has prisms in her glasses continuous had 2 surgeries since 2011  she corrected both sides at [facility] for strabismus last surgery 2019. She had an EKG with Dr. Sandi Mariscal and it was good per patient. Here with friend. She is not as alert or as detailed stressful house move. She has a lot of anxiety recently.  ? ?Patient complains of symptoms per HPI as well as the following symptoms: stress . Pertinent negatives and positives per HPI. All others negative ? ? ?HPI 01/16/2021: [name]  Marie Ford is a 76 y.o. female here as requested by Derinda Late, MD for "headaches, ataxia and cognitive impairment".  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 ? ?I reviewed Dr. Deboraha Sprang notes: Over the last year she has had chronic symptoms of headaches, CT scan of the sinuses in April showed sphenoid sinusitis, she had similar findings the year before, she saw Dr. [name] in ENT in April of this year and he did not feel the sinusitis required surgery was necessarily the cause of her headaches.  Associated with her headaches have been increasing symptoms of anxiety and depression, much of this related to stress, recently improved on fluoxetine, however the headaches fatigue and nausea have continued.  She also complains of ataxia over the last few months a few times a week as well as cognitive impairment short-term memory deficits and she is interested in more formal neuropsychological testing.  MRI of the brain in October was unremarkable but also showed the sphenoid sinusitis. ? ? ?She is here with her friend of 35 years, headaches are improved since surgery and antibiotic. Over the last 2 years she has had cognitive decline and physicial decline and lots of stress, hip replace,ent, 18-year relationship ended, she had to sell a house sh eloved and some communication problems with daughter, headaches are not so much the issue anymore. She has tremors under stress and feels "jittery". The left  side doesn't work as well on the left side, she has imbalance on the left side, grip is weakness on the left side, hand writing has changed has gotten smaller. She feels clumsy and slow, her friend provides much information and says she is having difficulty with fly fishing and she is a well known for her fly fishing, her gait is different and she is weak, chronic fatigue, smell and  taste is pretty much intact but not great she can't tell the difference between teas, some of the scent is gone, swallowing is ok, tremor is resting and with action, worse when under stress, left is worse. Friend has noticed her memory is declining more than she thinks is normal, she is not sleeping well at all, trouble falling asleep. She tries to meditate, she may try to eat something, she takes Xanax sometimes. Her gait is different, her left side is affected, hard to move that side and she doesn't swing her left arm. She walks her dog every day. Difficulty with the left hand with coordination, no falls, feels like she is shuffling, not lightheaded, she feels unbalanced, difficulty tieing the flys for fly fishing. Father died of stroke, no parkinson's disease. She is having difficulty remembering to do things, she loses steps, she has compensation techniques and uses her calendars, not getting lost, no hallucinations or delusions, no acting out dreams, but she is having vivid dreams, more dreaming, much more emotional and depressed but not overreacting. Friend states he looks sadder more flat in affect.  ? ?Reviewed notes, labs and imaging from outside physicians, which showed:  ? ?October 07, 2020: CMP with BUN 24, creatinine 1.12, glucose 101, otherwise unremarkable, TSH 2.59, sed rate 17 normal, CBC normal. ? ?CT Sinuses 11/10/2020: reviewed report and personally reviewed images (also reviewed with patient) ?FINDINGS: ?Paranasal sinuses: ?  ?Frontal: Aplastic on the left and hypoplastic on the right ?  ?Ethmoid: Frothy secretions in the posterior left ethmoid air cell. ?  ?Maxillary: Small presumed retention cysts on the right more than ?left. ?  ?Sphenoid: Chronic left sphenoid sinusitis with complete ?opacification and sclerotic wall thickening. Sclerotic wall ?thickening has progressed and the ostium appears covered by bone ?currently. ?  ?Right ostiomeatal unit: Patent. ?  ?Left ostiomeatal unit: Patent. ?   ?Nasal passages: Patent. Intact nasal septum is essentially midline. ?  ?Anatomy: No pneumatization superior to anterior ethmoid notches. ?Sellar sphenoid pneumatization pattern. No dehiscence of carotid or ?optic canals. No onodi cell. ?  ?Other: No incidental finding on soft tissue windows. ?  ?IMPRESSION: ?1. Chronic left sphenoid sinusitis with progressive sclerotic wall ?thickening since May 2021. The ostium now appears covered by ?calcification. ?2. Continued frothy secretions in the left posterior ethmoid air ?Cell. ? ?MRI brain 09/30/2020: Mild  chronic microvascular ischemic changes, typical for age.(personally reviewed and agree with findings) reviewed report and personally reviewed images (also reviewed with patient) ? ?10/05/2021: Here for follow up. Was seen at [facility] by a movement disorder specialist, her recommendations were to continue Sinemet, we reviewed the notes and the appointment at [facility] together with her good friend today, we reviewed resources in the community and I provided her with documentation on Parkinson's groups, Parkinson's specific exercise groups, patient, also patient feels she is depressed and has anxiety, would discuss with primary care Dr. Sandi Mariscal, per patient she has been tried on multiple medications, we did discuss at this point referring her to PA at [facility] that can help her with medication management  and also refer her to a therapist. I can refer her for physical therapy for Parkinson's disease specific PT. Headaches resolved after sphenoidectomy.  ? ?Interval history 05/03/2021: DAT scan consistent with parkinsonian disorder. We discussed today. I recommend patient be transitioned to [facility] Movements Disorder Team. They are the leaders in parkinson's Disease in this area.  ? ?She has tremor in her left hand at rest, worse with stress, she has some cognitive impairments and weak, she feels she can;t find the words and can't understand directions.  We discussed speech therapy, they are concerned about driving and he has a weakness in visual memory, we discussed a driving test, discussed regular skin tests with a dermatologist.   ? ? ?DAT Scan 3/

## 2022-04-11 NOTE — Patient Instructions (Signed)
Continue current dose Sinemet, doing fantastic ?

## 2022-04-23 ENCOUNTER — Encounter: Payer: Self-pay | Admitting: Gastroenterology

## 2022-04-23 ENCOUNTER — Ambulatory Visit: Payer: Medicare PPO | Admitting: Gastroenterology

## 2022-04-23 VITALS — BP 118/70 | HR 78 | Ht 66.0 in | Wt 147.0 lb

## 2022-04-23 DIAGNOSIS — K5730 Diverticulosis of large intestine without perforation or abscess without bleeding: Secondary | ICD-10-CM | POA: Diagnosis not present

## 2022-04-23 NOTE — Patient Instructions (Addendum)
It was my pleasure to provide care to you today. Based on our discussion, I am providing you with my recommendations below: ? ?RECOMMENDATION(S):  ? ?We discussed a colonoscopy. Between the ages of 5 and 3 we consider colonoscopy carefully weighing the risks and the benefits. You may call at any time to schedule a colonoscopy if you would like to proceed. ? ?We also discussed Cologuard - a stool test that screens for colon cancer. I would not recommend this study if you would not have a colonoscopy if the test is positive.   ? ?FOLLOW UP: ? ?I would like for you to follow up with me in as needed. Please call the office at [phone] to schedule your appointment. ? ?BMI: ? ?If you are age 29 or older, your body mass index should be between 23-30. Your Body mass index is 23.73 kg/m?Marland Kitchen If this is out of the aforementioned range listed, please consider follow up with your Primary Care Provider. ? ?MY CHART: ? ?The [facility] providers would like to encourage you to use [facility] to communicate with providers for non-urgent requests or questions.  Due to long hold times on the telephone, sending your provider a message by [facility] may be a faster and more efficient way to get a response.  Please allow 48 business hours for a response.  Please remember that this is for non-urgent requests.  ? ?Thank you for trusting me with your gastrointestinal care!   ? ?Tressia Danas, MD, MPH ? ?

## 2022-04-23 NOTE — Progress Notes (Signed)
? ?Referring Provider: Derinda Late, MD ?Primary Care Physician:  Derinda Late, MD ? ?Reason for Consultation:  Colon cancer screening ? ? ?IMPRESSION:  ?Referred for colonoscopy but she is not eager to have colonoscopy or Cologuard given her age. Although she has a family history of colon cancer she has had 3 normal colonoscopies.  Her chances of clinically significant polyps or colon cancer is very low.  However, it is not zero.  We had a long discussion about the risks and benefits of endoscopic evaluation.  She has no interest in proceeding at this time and realizes that she may have undiagnosed colon polyps and even colon cancer. ? ?Diverticulosis without history of diverticulitis.  High-fiber diet recommended. ? ?PLAN: ?- May consider colonoscopy at any time that she decides that she would like to proceed ?- High-fiber diet ?- Follow-up as needed ? ?HPI: Marie Ford is a 76 y.o. female referred by Dr. Sandi Mariscal for consideration for colonoscopy.  The history is obtained through the patient, review of her electronic health record, and records provided by Dr. Sandi Mariscal.  She has a history of Parkinson's disease, chronic headaches, anxiety, depression, insomnia, hypertension, impaired glucose tolerance, mixed hyperlipidemia, osteopenia, chronic constipation and cognitive impairment.  She is a retired [employer] administrator.  She is originally from [place]. ? ?Dr. Sandi Mariscal wants her to consider colonoscopy for colon cancer screening.  She was hoping that a colonoscopy was not needed at her age. She has multiple GI symptoms including loss of appetite, weight loss, and constipation that she attributes to Parkinson's. She does not feel that additional evaluation or treatment is needed.  ? ?Colonoscopy in 2000 by Dr. Sharlett Iles was normal ?Colonoscopy in 2006 by Dr. Sharlett Iles showed only diverticulosis.  ?Colonoscopy in 2011 by Dr. Sharlett Iles showed only diverticulosis. ? ?Brother died of colon cancer  at age 22.  He also had a history of colitis.  There is no other known family history of colon cancer or polyps. No family history of stomach cancer or other GI malignancy. No family history of inflammatory bowel disease or celiac.  ? ?Stays active. Was previously doing watery aerobics.  ?She has sold her house and is moving to [place].  ? ?Labs 03/14/2022 show a hemoglobin 14.4, MCV 96.2, RDW 11.5, platelets 365, TSH 3.01, hemoglobin A1c 5.7 ? ?Past Medical History:  ?Diagnosis Date  ? Chronic kidney disease   ? stage 3   ? Chronic sphenoidal sinusitis   ? Degenerative arthritis of hip   ? requirde surgery 2009  ? Depression 2010  ? Diplopia 2011  ? in left lateral gaze   ? Diplopia   ? " my eyes dont track together, i have corrective prisms in my eye glasses"   ? Diverticulosis 2006  ? noted in a colonoscopy  ? Esophageal reflux 2013  ? Hypertension   ? IGT (impaired glucose tolerance) 2011  ? denies hx of diabetes   ? Insomnia 2009  ? Mixed hyperlipidemia 2000  ? Osteoarthritis   ? Osteopenia 2005  ? Other and unspecified hyperlipidemia 2000  ? Other specified disease of sebaceous glands   ? Palpitations 2009  ? secondary to PSVT  ? Parkinson disease ([place])   ? PNA (pneumonia) 2014  ? Psoriasis 2004  ? PSVT (paroxysmal supraventricular tachycardia) ([place]) 2009  ? "report it has been some time since i had that "   ? Situational stress   ? Venous insufficiency since 2017  ? ? ?Past Surgical History:  ?  Procedure Laterality Date  ? CATARACT EXTRACTION W/ INTRAOCULAR LENS  IMPLANT, BILATERAL    ? COLONOSCOPY  01/30/2010  ? Sharlett Iles  ? ETHMOIDECTOMY Left 11/17/2020  ? Procedure: LEFT SIDED TOTAL ETHMOIDECTOMY;  Surgeon: Rozetta Nunnery, MD;  Location: [facility];  Service: ENT;  Laterality: Left;  ? EYE SURGERY    ? HEMORRHOID SURGERY    ? JOINT REPLACEMENT    ? LASIK    ? left medial and superior rectus muscle recesion for diplopia correction    ? SINUS ENDO WITH FUSION Left 11/17/2020  ? Procedure:  SINUS ENDOSCOPY WITH FUSION NAVIGATION;  Surgeon: Rozetta Nunnery, MD;  Location: [facility];  Service: ENT;  Laterality: Left;  ? SPHENOIDECTOMY Left 11/17/2020  ? Procedure: LEFT SIDED SPHENOIDECTOMY WITH TISSUE REMOVAL;  Surgeon: Rozetta Nunnery, MD;  Location: [facility];  Service: ENT;  Laterality: Left;  ? surgery of right eye rectus muscles for diplpia correction   2019  ? TOTAL HIP ARTHROPLASTY Right 2010  ? TOTAL HIP ARTHROPLASTY Left 08/19/2019  ? Procedure: TOTAL HIP ARTHROPLASTY ANTERIOR APPROACH;  Surgeon: Gaynelle Arabian, MD;  Location: [facility];  Service: Orthopedics;  Laterality: Left;  130min  ? TURBINATE REDUCTION Left 11/17/2020  ? Procedure: LEFT SIDED TURBINATE REDUCTION;  Surgeon: Rozetta Nunnery, MD;  Location: [facility];  Service: ENT;  Laterality: Left;  ? URETHRAL SLING    ? midurethral sling with TVT Exact and Cystoscopy  ? ? ? ? ?Current Outpatient Medications  ?Medication Sig Dispense Refill  ? ALPRAZolam (XANAX) 0.5 MG tablet Take by mouth.    ? buPROPion (WELLBUTRIN XL) 150 MG 24 hr tablet Take 100 mg by mouth 2 (two) times daily.    ? Calcium Carbonate Antacid (CALCIUM CARBONATE PO) Take by mouth.    ? carbidopa-levodopa (SINEMET IR) 25-100 MG tablet Take 1 tablet by mouth 3 (three) times daily. 90 tablet 5  ? ezetimibe (ZETIA) 10 MG tablet ezetimibe 10 mg tablet ? Take 1 tablet every day by oral route.    ? Multiple Vitamin (MULTIVITAMIN WITH MINERALS) TABS tablet Take 1 tablet by mouth daily. Women's Multivitamin    ? Polyethyl Glycol-Propyl Glycol (SYSTANE) 0.4-0.3 % SOLN Place 1 drop into both eyes 3 (three) times daily as needed (dry/irritated eyes.).    ? pravastatin (PRAVACHOL) 80 MG tablet Take 80 mg by mouth every evening.     ? triamcinolone (NASACORT) 55 MCG/ACT AERO nasal inhaler Place 2 sprays into the nose daily. Use at night 1 each 12  ? triamterene-hydrochlorothiazide (MAXZIDE-25) 37.5-25 MG tablet Take 1 tablet  by mouth daily.    ? ?No current facility-administered medications for this visit.  ? ? ?Allergies as of 04/23/2022 - Review Complete 04/23/2022  ?Allergen Reaction Noted  ? Erythromycin Shortness Of Breath and Rash   ? Macrobid [nitrofurantoin] Other (See Comments) 12/23/2017  ? Levofloxacin Nausea Only 10/04/2021  ? Penicillins Rash   ? Sulfamethoxazole-trimethoprim Rash 10/19/2020  ? ? ?Family History  ?Problem Relation Age of Onset  ? Congestive Heart Failure Mother   ? CVA Father   ? Heart failure Father   ? Non-Hodgkin's lymphoma Sister   ? Alcohol abuse Sister   ? Liver cancer Brother   ? Colon cancer Brother 25  ? Colitis Brother   ? Hypertension Brother   ? Gout Brother   ? Melanoma Daughter   ? Parkinson's disease Cousin   ? Esophageal cancer Neg Hx   ?  Rectal cancer Neg Hx   ? Stomach cancer Neg Hx   ? Colon polyps Neg Hx   ? ? ? ? ?Physical Exam: ?General:   Alert,  well-nourished, pleasant and cooperative in NAD ?Head:  Normocephalic and atraumatic. ?Eyes:  Sclera clear, no icterus.   Conjunctiva pink. ?Ears:  Normal auditory acuity. ?Nose:  No deformity, discharge,  or lesions. ?Mouth:  No deformity or lesions.   ?Neck:  Supple; no masses or thyromegaly. ?Lungs:  Clear throughout to auscultation.   No wheezes. ?Heart:  Regular rate and rhythm; no murmurs. ?Abdomen:  Soft, nontender, nondistended, normal bowel sounds, no rebound or guarding. No hepatosplenomegaly.   ?Rectal:  Deferred  ?Msk:  Symmetrical. No boney deformities ?LAD: No inguinal or umbilical LAD ?Extremities:  No clubbing or edema. ?Neurologic:  Alert and  oriented x4;  grossly nonfocal ?Skin:  Intact without significant lesions or rashes. ?Psych:  Alert and cooperative. Normal mood and affect. ? ? ? ?Mikiyah Glasner L. Tarri Glenn, MD, MPH ?04/23/2022, 5:13 PM ? ? ? ?  ?

## 2022-06-06 ENCOUNTER — Encounter: Payer: Self-pay | Admitting: Neurology

## 2022-06-07 IMAGING — MR MR HEAD WO/W CM
12 series · 48 of 48 positions shown · IV contrast (multihance)
Comparison: None.

CLINICAL DATA: Persistent headache.  Ataxia and nausea.

EXAM:
MRI HEAD WITHOUT AND WITH CONTRAST
TECHNIQUE: Multiplanar, multiecho pulse sequences of the brain and surrounding
structures were obtained without and with intravenous contrast.
CONTRAST:  15mL MULTIHANCE GADOBENATE DIMEGLUMINE 529 MG/ML IV SOLN

[Series 2: T1 · sagittal · 5.0mm · 0.45mm/px · 1 of 21 slices shown]
[im 1/21]
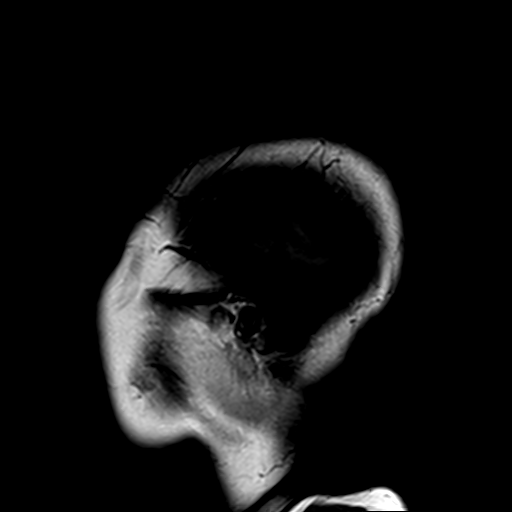

[Series 3: DWI · axial · 3.0mm · 1.80mm/px · z∈[-41,+103]mm · 7 of 100 slices shown (1 of 4)]
[im 1/100]
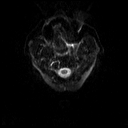
[im 17/100]
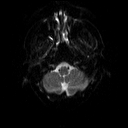
[im 34/100]
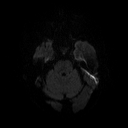
[im 50/100]
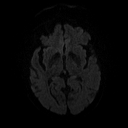
[im 67/100]
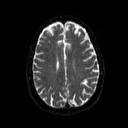
[im 83/100]
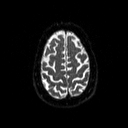
[im 100/100]
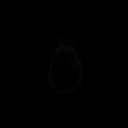

[Series 4: DWI · axial · 3.0mm · 1.80mm/px · z∈[-41,+103]mm · 3 of 48 slices shown (2 of 4)]
[im 1/48]
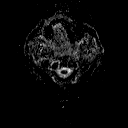
[im 24/48]
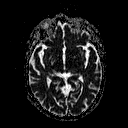
[im 48/48]
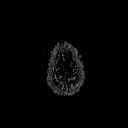

[Series 5: DWI · coronal · 5.0mm · 1.80mm/px · 5 of 68 slices shown (3 of 4)]
[im 1/68]
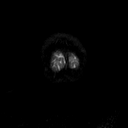
[im 17/68]
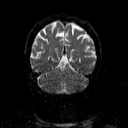
[im 34/68]
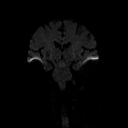
[im 51/68]
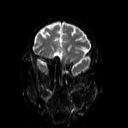
[im 68/68]
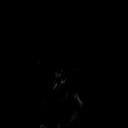

[Series 6: DWI · coronal · 5.0mm · 1.80mm/px · 2 of 34 slices shown (4 of 4)]
[im 1/34]
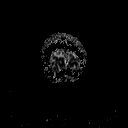
[im 34/34]
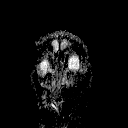

[Series 7: T2 · axial · 5.0mm · 0.60mm/px · z∈[-38,+102]mm · 2 of 22 slices shown (1 of 2)]
[im 1/22]
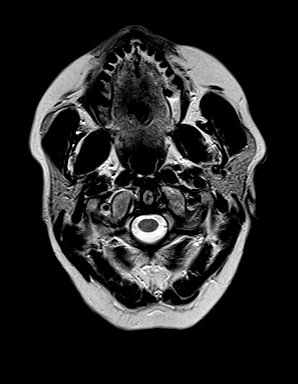
[im 22/22]
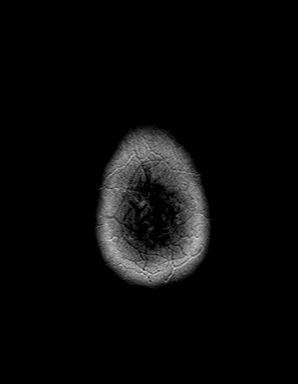

[Series 8: FLAIR · axial · 3.0mm · 0.45mm/px · z∈[-33,+100]mm · 2 of 30 slices shown]
[im 1/30]
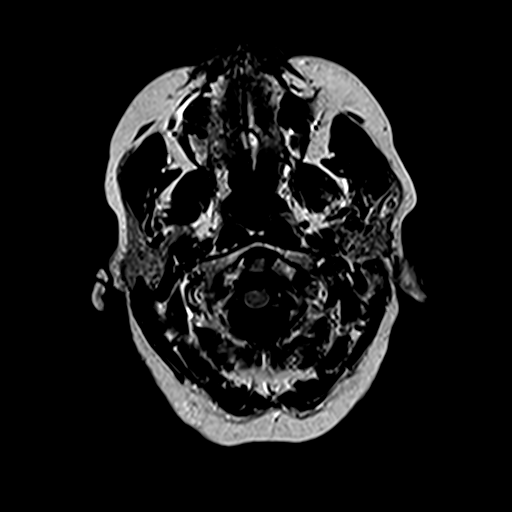
[im 30/30]
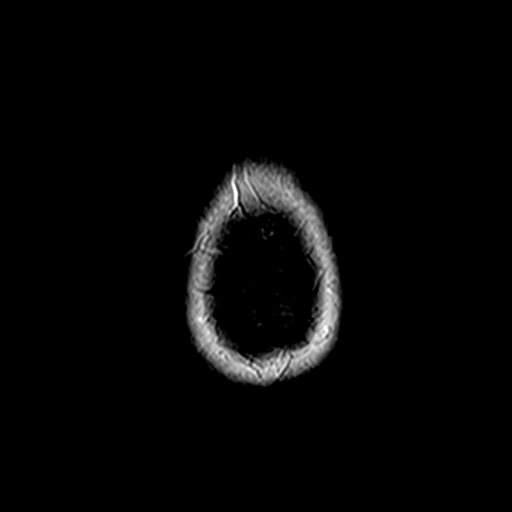

[Series 10: swi_images · axial · 4.0mm · 0.90mm/px · z∈[-35,+103]mm · 2 of 36 slices shown]
[im 1/36]
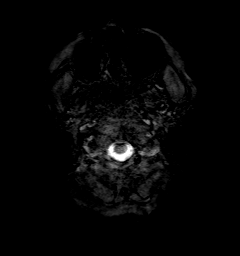
[im 36/36]
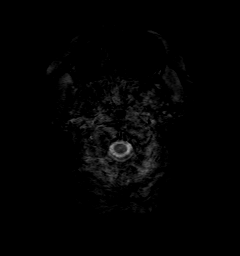

[Series 11: t1_mpr_tra · axial · 1.0mm · 0.75mm/px · z∈[-35,+105]mm · 10 of 144 slices shown (1 of 2)]
[im 1/144]
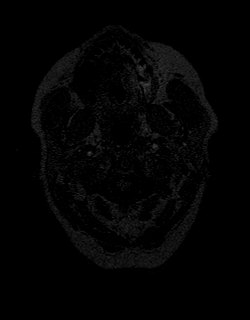
[im 16/144]
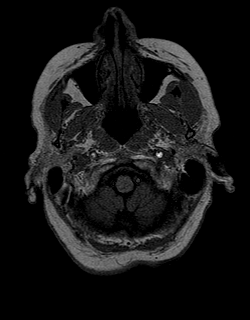
[im 32/144]
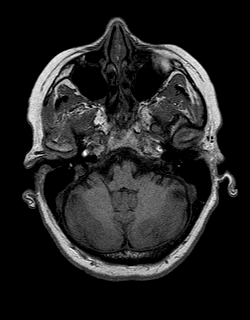
[im 48/144]
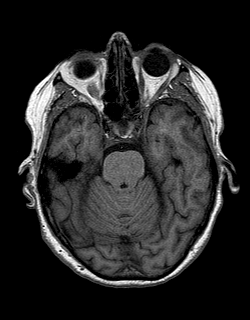
[im 64/144]
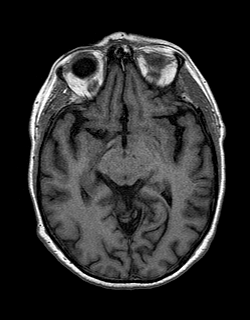
[im 80/144]
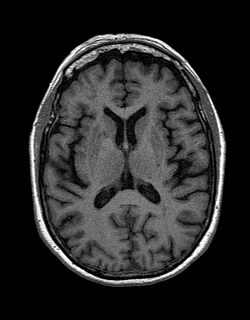
[im 96/144]
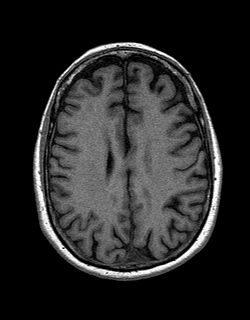
[im 112/144]
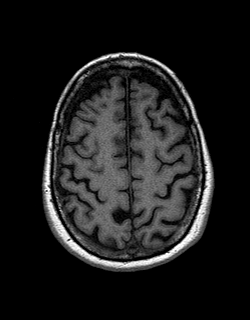
[im 128/144]
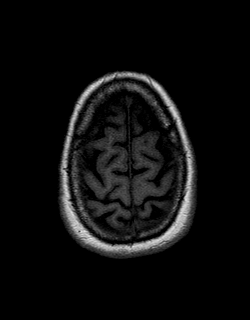
[im 144/144]
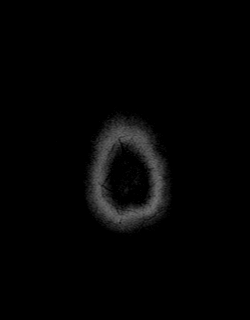

[Series 12: T2 · coronal · 5.0mm · 0.45mm/px · 2 of 25 slices shown (2 of 2)]
[im 1/25]
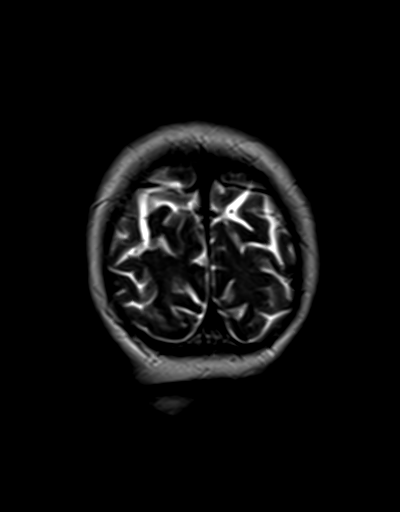
[im 25/25]
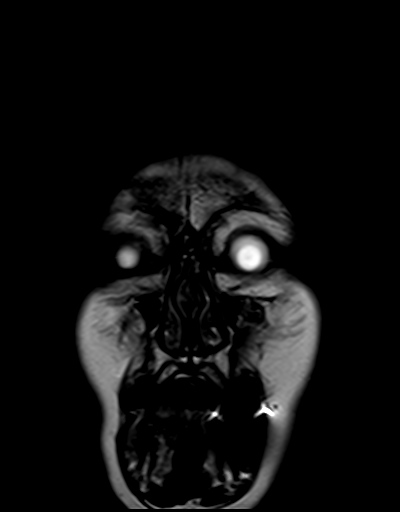

[Series 13: t1_mpr_tra · axial · 1.0mm · 0.75mm/px · z∈[-35,+105]mm · 10 of 144 slices shown (2 of 2)]
[im 1/144]
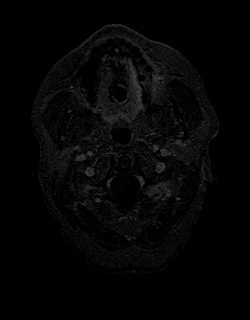
[im 16/144]
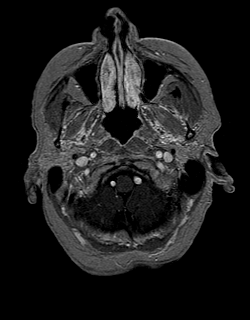
[im 32/144]
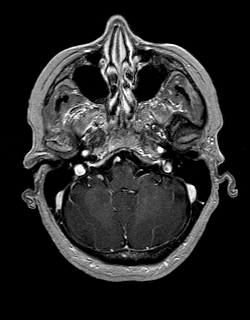
[im 48/144]
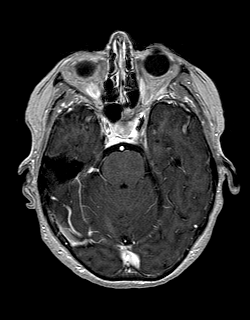
[im 64/144]
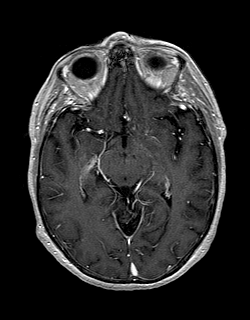
[im 80/144]
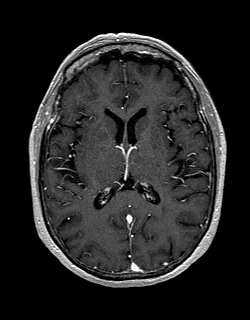
[im 96/144]
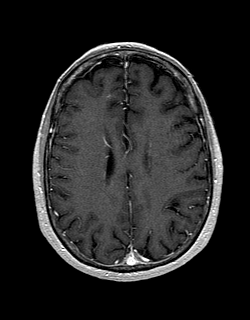
[im 112/144]
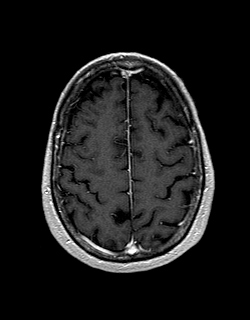
[im 128/144]
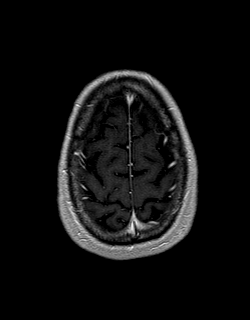
[im 144/144]
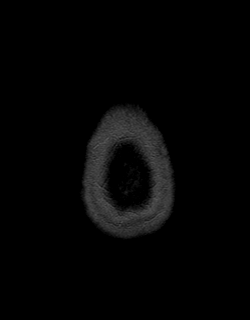

[Series 14: post cor · coronal · 5.0mm · 0.45mm/px · 2 of 25 slices shown]
[im 1/25]
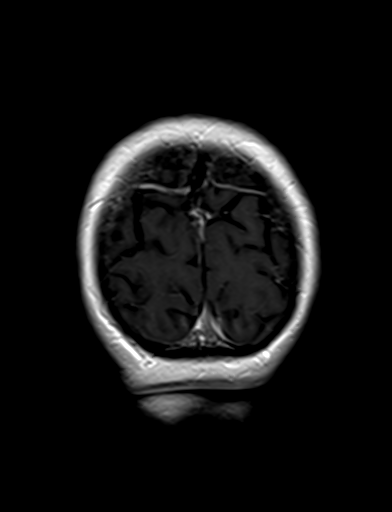
[im 25/25]
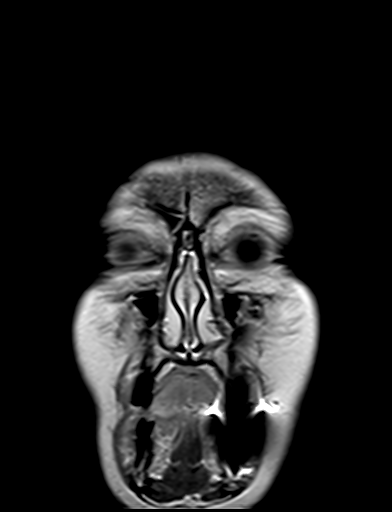

[48 of 48 positions shown; findings below may reference images not displayed]

FINDINGS: Brain: Ventricle size and cerebral volume normal for age. Mild white
matter changes with patchy white matter hyperintensities
bilaterally. Brainstem and cerebellum intact. Negative for
hemorrhage or mass. Normal enhancement postcontrast administration.

Vascular: Normal arterial flow voids.

Skull and upper cervical spine: No focal skeletal lesion.

Sinuses/Orbits: Mild mucosal edema paranasal sinuses. Bilateral
cataract extraction

Other: None
IMPRESSION: No acute abnormality

Mild   chronic microvascular ischemic changes, typical for age.

## 2022-06-18 ENCOUNTER — Encounter: Payer: Self-pay | Admitting: Neurology

## 2022-06-18 DIAGNOSIS — G20 Parkinson's disease: Secondary | ICD-10-CM

## 2022-06-18 DIAGNOSIS — R413 Other amnesia: Secondary | ICD-10-CM

## 2022-06-21 ENCOUNTER — Telehealth: Payer: Self-pay | Admitting: Neurology

## 2022-06-21 NOTE — Telephone Encounter (Signed)
Referrals sent to [facility] per patient, she lives there.

## 2022-06-25 ENCOUNTER — Telehealth: Payer: Self-pay | Admitting: Neurology

## 2022-06-25 NOTE — Telephone Encounter (Signed)
Referral for PT, OT, ST, sent to [facility] [phone].

## 2022-08-09 ENCOUNTER — Ambulatory Visit: Payer: Medicare PPO | Admitting: Occupational Therapy

## 2022-08-09 ENCOUNTER — Ambulatory Visit: Payer: Medicare PPO | Admitting: Physical Therapy

## 2022-08-09 ENCOUNTER — Ambulatory Visit: Payer: Medicare PPO

## 2022-09-13 IMAGING — CR DG CHEST 2V
2 series · 2 of 2 positions shown · non-contrast
Comparison: 01/25/2020

CLINICAL DATA: Shortness of breath on exertion for 2 months,
initial encounter

EXAM:
CHEST - 2 VIEW

[w chest pa]
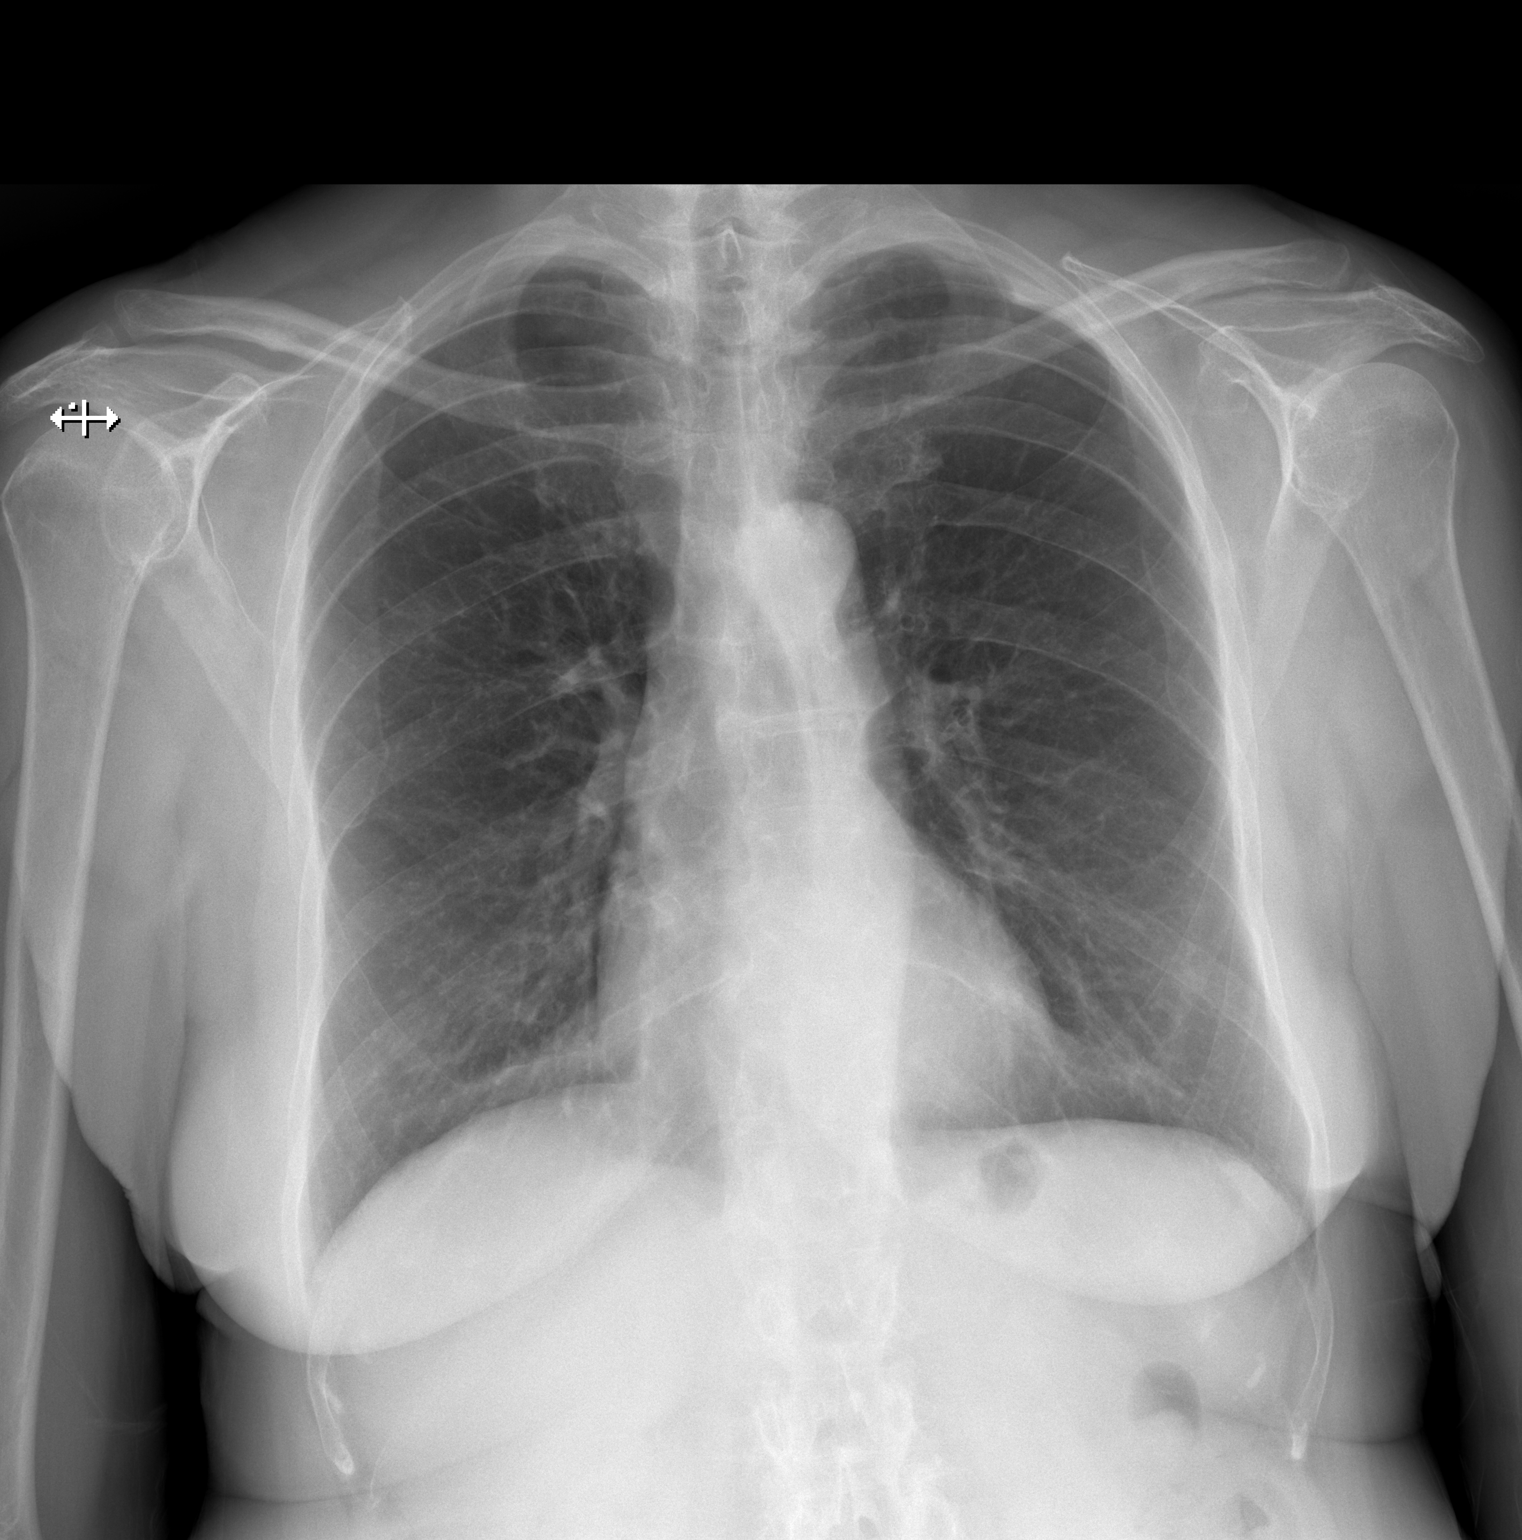

[w chest lat]
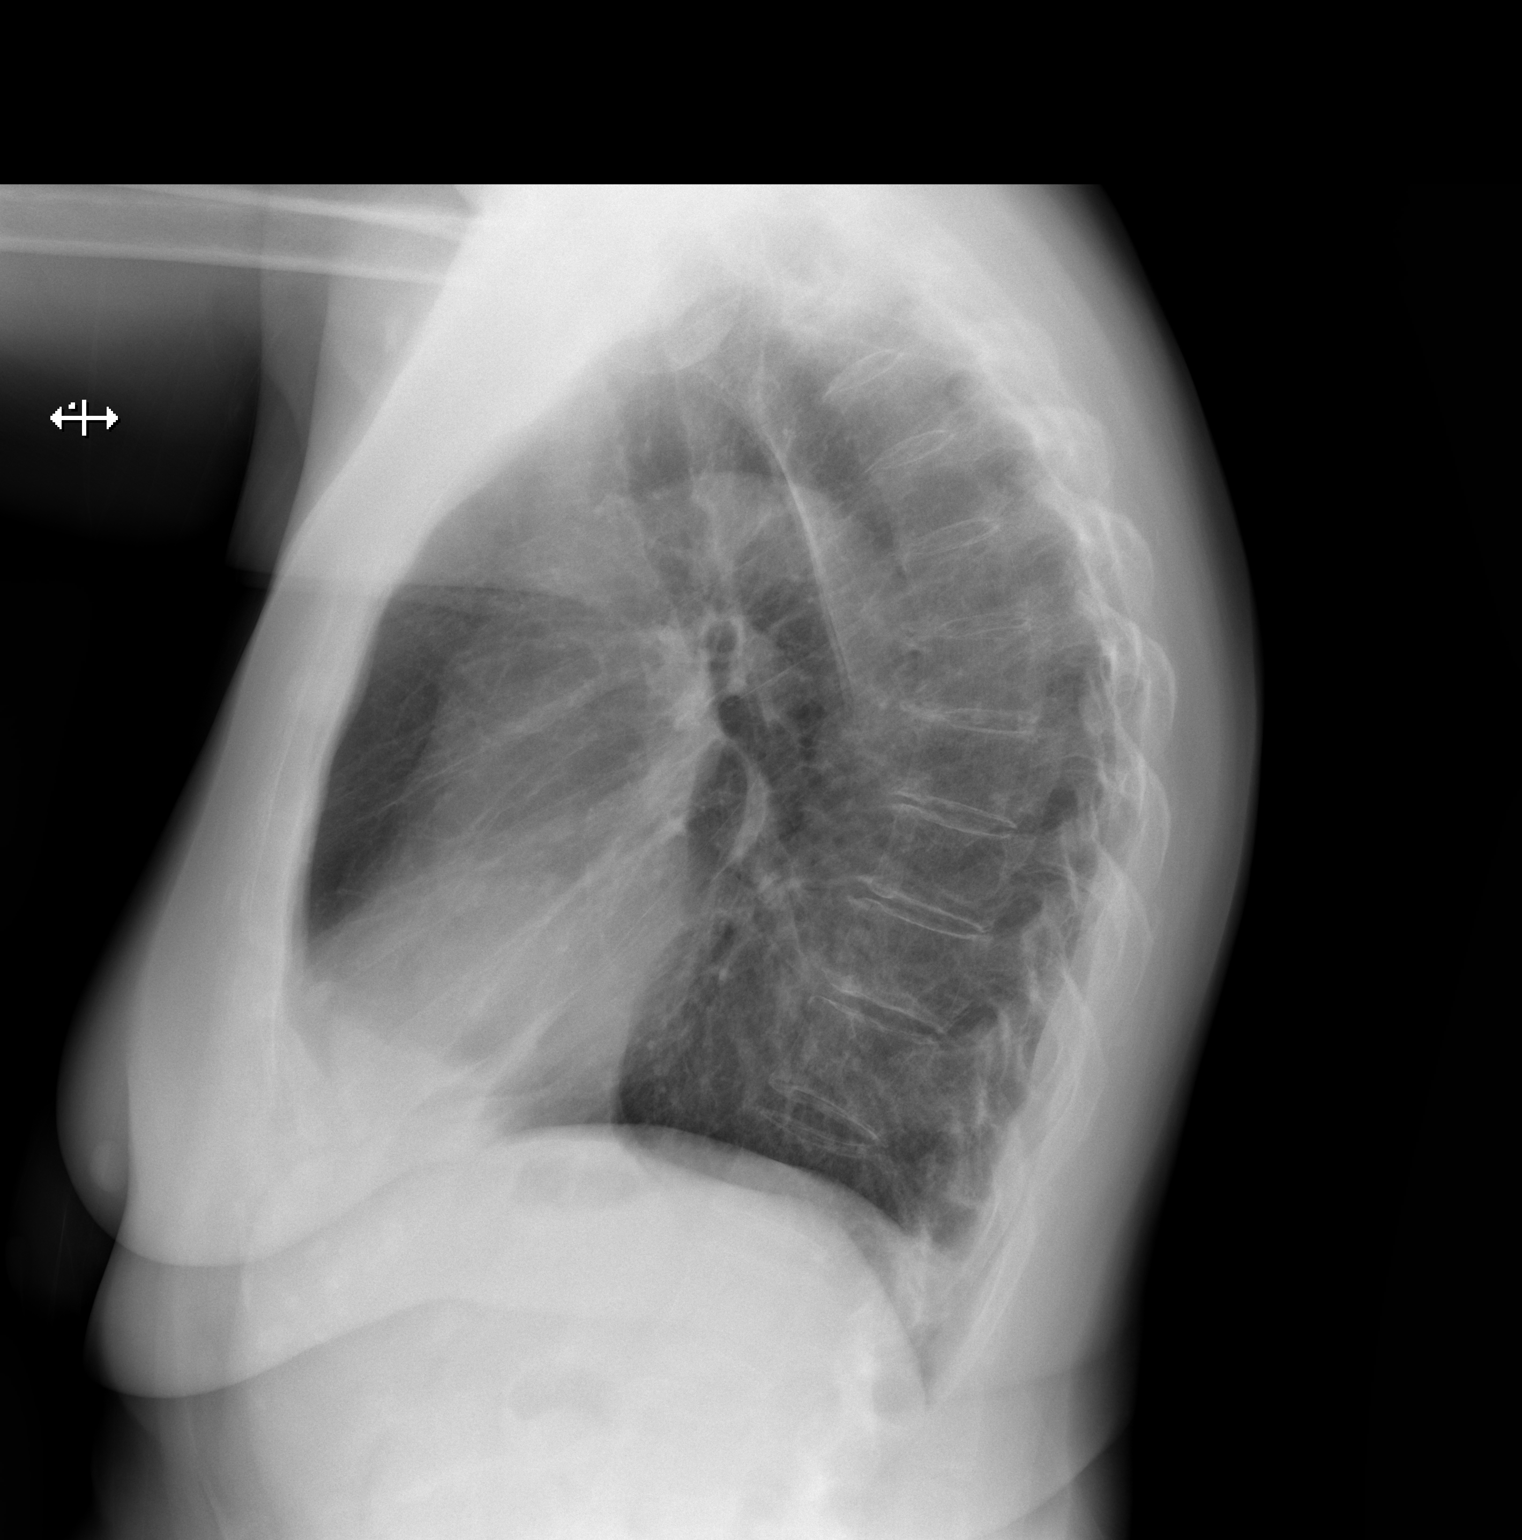

[2 of 2 positions shown; findings below may reference images not displayed]

FINDINGS: Cardiac shadow is within normal limits. The lungs are well aerated
bilaterally. No focal infiltrate or sizable effusion is seen. No
acute bony abnormality is noted.
IMPRESSION: No acute abnormality seen.

## 2022-09-20 ENCOUNTER — Other Ambulatory Visit: Payer: Self-pay | Admitting: Neurology

## 2022-10-17 ENCOUNTER — Ambulatory Visit: Payer: Medicare PPO | Admitting: Neurology

## 2022-10-17 ENCOUNTER — Telehealth: Payer: Self-pay | Admitting: Neurology

## 2022-10-17 ENCOUNTER — Encounter: Payer: Self-pay | Admitting: Neurology

## 2022-10-17 VITALS — BP 125/72 | HR 71 | Ht 66.0 in | Wt 148.0 lb

## 2022-10-17 DIAGNOSIS — G20A1 Parkinson's disease without dyskinesia, without mention of fluctuations: Secondary | ICD-10-CM | POA: Diagnosis not present

## 2022-10-17 DIAGNOSIS — R4189 Other symptoms and signs involving cognitive functions and awareness: Secondary | ICD-10-CM

## 2022-10-17 MED ORDER — CARBIDOPA-LEVODOPA 25-100 MG PO TABS: 1.0000 | ORAL_TABLET | ORAL | 11 refills | Status: DC | PRN

## 2022-10-17 NOTE — Progress Notes (Signed)
[name]    Provider:  Dr [name] Requesting Provider: [name], MD Primary Care Provider:  [name], MD  CC:  Headache, memory loss, gait abnormality  10/17/2022: Patient states she wears off about 1/2-3/4 hr before her next dose is due. Also she feels her cognition, decision making, word finding, forgetfullness, fatigue is worsening. We will increase sinemet to every 4 hours. She has started TMS and feels it is helping her mood. She hasn't been crying as much and she is feeling better. Patient requests follow up neurocognitive testing with Dr. [name], she is willing to wait 8 months to a year, she had testing in 2022 and feels cognition is worsening, she has parkinson's disease.  Patient complains of symptoms per HPI as well as the following symptoms: cognitive decline . Pertinent negatives and positives per HPI. All others negative   04/11/2022: she moved back to [place] permanently, her dog passed away and this hurt her heart. She is moving into [employer]. She gets very tired easily, she has fatigue even if she had a good night's sleep, she has dry mouth in the morning, do not feel refreshed but also don't fall asleep during the day or nap so unlikely. BP is great.  She had a comprehensive examination 2 weeks ago annual and had blood work completed. She thinks stress is a lot of it and she had a sinus surgery and she is healing. She exercises 20 minutes a day. No falls. She went to physical therapy and she is doing all the exercises. She walks. She feels better after resting. Tremors return after 4-5 hours and that is when she takes a sinemet 730, 1230, 530-6.  The medicine might make her a little tired. She is also on alprazolam at night and takes it for stress during the day and that may make her tired as well. She has chronic diplopia and has prisms in her glasses continuous had 2 surgeries since 2011  she corrected both sides at [facility] for strabismus last  surgery 2019. She had an EKG with [name] and it was good per patient. Here with friend. She is not as alert or as detailed stressful house move. She has a lot of anxiety recently.   Patient complains of symptoms per HPI as well as the following symptoms: stress . Pertinent negatives and positives per HPI. All others negative   HPI 01/16/2021: [name]  Marie Ford is a 76 y.o. female here as requested by [name], MD for "headaches, ataxia and cognitive impairment".  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  I reviewed Dr. [name] notes: Over the last year she has had chronic symptoms of headaches, CT scan of the sinuses in April showed sphenoid sinusitis, she had similar findings the year before, she saw Dr. [name] in ENT in April of this year and he did not feel the sinusitis required surgery was necessarily the cause of her headaches.  Associated with her headaches have been increasing symptoms of anxiety and depression, much of this related to stress, recently improved on fluoxetine, however the headaches fatigue and nausea have continued.  She  also complains of ataxia over the last few months a few times a week as well as cognitive impairment short-term memory deficits and she is interested in more formal neuropsychological testing.  MRI of the brain in October was unremarkable but also showed the sphenoid sinusitis.   She is here with her friend of 35 years, headaches are improved since surgery and antibiotic. Over  the last 2 years she has had cognitive decline and physicial decline and lots of stress, hip replace,ent, 18-year relationship ended, she had to sell a house sh eloved and some communication problems with daughter, headaches are not so much the issue anymore. She has tremors under stress and feels "jittery". The left side doesn't work as well on the left side, she has imbalance on the left side, grip is weakness on the left side, hand writing has changed has gotten smaller. She feels clumsy and slow, her friend provides much information and says she is having difficulty with fly fishing and she is a well known for her fly fishing, her gait is different and she is weak, chronic fatigue, smell and taste is pretty much intact but not great she can't tell the difference between teas, some of the scent is gone, swallowing is ok, tremor is resting and with action, worse when under stress, left is worse. Friend has noticed her memory is declining more than she thinks is normal, she is not sleeping well at all, trouble falling asleep. She tries to meditate, she may try to eat something, she takes Xanax sometimes. Her gait is different, her left side is affected, hard to move that side and she doesn't swing her left arm. She walks her dog every day. Difficulty with the left hand with coordination, no falls, feels like she is shuffling, not lightheaded, she feels unbalanced, difficulty tieing the flys for fly fishing. Father died of stroke, no parkinson's disease. She is having difficulty remembering to do things, she loses steps, she has compensation techniques and uses her calendars, not getting lost, no hallucinations or delusions, no acting out dreams, but she is having vivid dreams, more dreaming, much more emotional and depressed but not overreacting. Friend states he looks sadder more flat in affect.   Reviewed notes, labs and imaging from outside physicians, which showed:   October 07, 2020: CMP with BUN 24,  creatinine 1.12, glucose 101, otherwise unremarkable, TSH 2.59, sed rate 17 normal, CBC normal.  CT Sinuses 11/10/2020: reviewed report and personally reviewed images (also reviewed with patient) FINDINGS: Paranasal sinuses:   Frontal: Aplastic on the left and hypoplastic on the right   Ethmoid: Frothy secretions in the posterior left ethmoid air cell.   Maxillary: Small presumed retention cysts on the right more than left.   Sphenoid: Chronic left sphenoid sinusitis with complete opacification and sclerotic wall thickening. Sclerotic wall thickening has progressed and the ostium appears covered by bone currently.   Right ostiomeatal unit: Patent.   Left ostiomeatal unit: Patent.   Nasal passages: Patent. Intact nasal septum is essentially midline.   Anatomy: No pneumatization superior to anterior ethmoid notches. Sellar sphenoid pneumatization pattern. No dehiscence of carotid or optic canals. No onodi cell.   Other: No incidental finding on soft tissue windows.   IMPRESSION: 1. Chronic left sphenoid sinusitis with progressive sclerotic wall thickening since May 2021. The ostium now appears covered by calcification. 2. Continued frothy secretions in the left posterior ethmoid air Cell.  MRI brain 09/30/2020: Mild  chronic microvascular ischemic changes, typical for age.(personally  reviewed and agree with findings) reviewed report and personally reviewed images (also reviewed with patient)  10/05/2021: Here for follow up. Was seen at [facility] by a movement disorder specialist, her recommendations were to continue Sinemet, we reviewed the notes and the appointment at [facility] together with her good friend today, we reviewed resources in the community and I provided her with documentation on Parkinson's groups, Parkinson's specific exercise groups, patient, also patient feels she is depressed and has anxiety, would discuss with primary care [name], per patient she has  been tried on multiple medications, we did discuss at this point referring her to PA at [facility] that can help her with medication management and also refer her to a therapist. I can refer her for physical therapy for Parkinson's disease specific PT. Headaches resolved after sphenoidectomy.   Interval history 05/03/2021: DAT scan consistent with parkinsonian disorder. We discussed today. I recommend patient be transitioned to [facility] Movements Disorder Team. They are the leaders in parkinson's Disease in this area.   She has tremor in her left hand at rest, worse with stress, she has some cognitive impairments and weak, she feels she can;t find the words and can't understand directions. We discussed speech therapy, they are concerned about driving and he has a weakness in visual memory, we discussed a driving test, discussed regular skin tests with a dermatologist.     DAT Scan 02/09/2021: Marked decreased activity within the LEFT and RIGHT striatum. Slightly greater deficit on the RIGHT. This pattern can be found in Parkinsonian syndromes pathology.  Review of Systems: Patient complains of symptoms per HPI as well as the following symptoms: anxiety . Pertinent negatives and positives per HPI. All others negative .   Social History   Socioeconomic History   Marital status: Single    Spouse name: Not on file   Number of children: 1   Years of education: Not on file   Highest education level: Doctorate  Occupational History   Occupation: retired  Tobacco Use   Smoking status: Never   Smokeless tobacco: Never  Vaping Use   Vaping Use: Never used  Substance and Sexual Activity   Alcohol use: Yes    Comment: rarely   Drug use: No   Sexual activity: Not Currently    Birth control/protection: Post-menopausal  Other Topics Concern   Not on file  Social History Narrative   Lives at [facility]   Right handed   Caffeine: rarely   Social Determinants of  Health   Financial Resource Strain: Not on file  Food Insecurity: Not on file  Transportation Needs: Not on file  Physical Activity: Not on file  Stress: Not on file  Social Connections: Not on file  Intimate Partner Violence: Not on file    Family History  Problem Relation Age of Onset   Congestive Heart Failure Mother    CVA Father    Heart failure Father    Non-Hodgkin's lymphoma Sister    Alcohol abuse Sister    Liver cancer Brother    Colon cancer Brother 42   Colitis Brother    Hypertension Brother    Gout Brother    Melanoma Daughter    Parkinson's disease Cousin    Esophageal cancer Neg Hx    Rectal cancer Neg Hx    Stomach cancer Neg Hx    Colon polyps Neg Hx     Past Medical History:  Diagnosis Date   Chronic kidney disease  stage 3    Chronic sphenoidal sinusitis    Degenerative arthritis of hip    requirde surgery 2009   Depression 2010   Diplopia 2011   in left lateral gaze    Diplopia    " my eyes dont track together, i have corrective prisms in my eye glasses"    Diverticulosis 2006   noted in a colonoscopy   Esophageal reflux 2013   Hypertension    IGT (impaired glucose tolerance) 2011   denies hx of diabetes    Insomnia 2009   Mixed hyperlipidemia 2000   Osteoarthritis    Osteopenia 2005   Other and unspecified hyperlipidemia 2000   Other specified disease of sebaceous glands    Palpitations 2009   secondary to PSVT   Parkinson disease    PNA (pneumonia) 2014   Psoriasis 2004   PSVT (paroxysmal supraventricular tachycardia) 2009   "report it has been some time since i had that "    Situational stress    Venous insufficiency since 2017    Patient Active Problem List   Diagnosis Date Noted   Parkinson's disease 10/08/2021   Anxiety 10/08/2021   Depression 10/08/2021   OA (osteoarthritis) of hip 08/19/2019   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   Diverticular disease 07/10/2017   Gastroesophageal reflux disease 07/10/2017    Osteopenia 07/10/2017   Insomnia 06/03/2016   Essential hypertension 03/26/2014   Hyperlipidemia 03/01/2014   Other specified postprocedural states 03/01/2014   Exertional dyspnea 02/07/2014   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   Diplopia 12/15/2013   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    Past Surgical History:  Procedure Laterality Date   CATARACT EXTRACTION W/ INTRAOCULAR LENS  IMPLANT, BILATERAL     COLONOSCOPY  01/30/2010   Jarold Motto   ETHMOIDECTOMY Left 11/17/2020   Procedure: LEFT SIDED TOTAL ETHMOIDECTOMY;  Surgeon: Drema Halon, MD;  Location: [facility];  Service: ENT;  Laterality: Left;   EYE SURGERY     HEMORRHOID SURGERY     JOINT REPLACEMENT     LASIK     left medial and superior rectus muscle recesion for diplopia correction     SINUS ENDO WITH FUSION Left 11/17/2020   Procedure: SINUS ENDOSCOPY WITH FUSION NAVIGATION;  Surgeon: Drema Halon, MD;  Location: [facility];  Service: ENT;  Laterality: Left;   SPHENOIDECTOMY Left 11/17/2020   Procedure: LEFT SIDED SPHENOIDECTOMY WITH TISSUE REMOVAL;  Surgeon: Drema Halon, MD;  Location: [facility];  Service: ENT;  Laterality: Left;   surgery of right eye rectus muscles for diplpia correction   2019   TOTAL HIP ARTHROPLASTY Right 2010   TOTAL HIP ARTHROPLASTY Left 08/19/2019   Procedure: TOTAL HIP ARTHROPLASTY ANTERIOR APPROACH;  Surgeon: Ollen Gross, MD;  Location: [facility];  Service: Orthopedics;  Laterality: Left;    TURBINATE REDUCTION Left 11/17/2020   Procedure: LEFT SIDED TURBINATE REDUCTION;  Surgeon: Drema Halon, MD;  Location: [facility];  Service: ENT;  Laterality: Left;   URETHRAL SLING     midurethral sling with TVT Exact and Cystoscopy    Current Outpatient Medications  Medication Sig Dispense Refill   ALPRAZolam (XANAX) 0.5 MG tablet Take 0.25 mg by mouth 3 (three) times daily. Takes as needed  for sleep     AUVELITY 45-105 MG TBCR Take 1 tablet by mouth every evening.     buPROPion ER (WELLBUTRIN SR) 100 MG 12 hr tablet Take 100 mg by  mouth in the morning.     Calcium Carbonate Antacid (CALCIUM CARBONATE PO) Take by mouth.     estradiol (ESTRACE) 0.1 MG/GM vaginal cream Place vaginally.     ezetimibe (ZETIA) 10 MG tablet ezetimibe 10 mg tablet  Take 1 tablet every day by oral route.     Multiple Vitamin (MULTIVITAMIN WITH MINERALS) TABS tablet Take 1 tablet by mouth daily. Women's Multivitamin     Polyethyl Glycol-Propyl Glycol (SYSTANE) 0.4-0.3 % SOLN Place 1 drop into both eyes 3 (three) times daily as needed (dry/irritated eyes.).     pravastatin (PRAVACHOL) 80 MG tablet Take 80 mg by mouth every evening.      triamcinolone (NASACORT) 55 MCG/ACT AERO nasal inhaler Place 2 sprays into the nose daily. Use at night (Patient taking differently: Place 1 spray into the nose daily. Use at night) 1 each 12   triamterene-hydrochlorothiazide (MAXZIDE-25) 37.5-25 MG tablet Take 1 tablet by mouth daily.     carbidopa-levodopa (SINEMET IR) 25-100 MG tablet Take 1 tablet by mouth every 4 (four) hours as needed. 180 tablet 11   No current facility-administered medications for this visit.    Allergies as of 10/17/2022 - Review Complete 10/17/2022  Allergen Reaction Noted   Erythromycin Shortness Of Breath and Rash    Macrobid [nitrofurantoin] Other (See Comments) 12/23/2017   Levofloxacin Nausea Only 10/04/2021   Penicillins Rash    Sulfamethoxazole-trimethoprim Rash 10/19/2020    Vitals: BP 125/72 (BP Location: Left Arm, Patient Position: Sitting)   Pulse 71   Ht 5\' 6"  (1.676 m)   Wt 148 lb (67.1 kg)   BMI 23.89 kg/m  Last Weight:  Wt Readings from Last 1 Encounters:  10/17/22 148 lb (67.1 kg)   Last Height:   Ht Readings from Last 1 Encounters:  10/17/22 5\' 6"  (1.676 m)     Physical exam: stable, repeated in entirety Exam: Gen: NAD, conversant, well nourised,  well  groomed                     CV: RRR, no MRG. No Carotid Bruits. No peripheral edema, warm, nontender Eyes: Conjunctivae clear without exudates or hemorrhage  Neuro: Improved Detailed Neurologic Exam  Speech:    Speech is normal; fluent and spontaneous with normal comprehension.  Cognition:     01/16/2021   10:31 AM  MMSE - Mini Mental State Exam  Orientation to time 5  Orientation to Place 5  Registration 3  Attention/ Calculation 4  Recall 3  Language- name 2 objects 2  Language- repeat 1  Language- follow 3 step command 3  Language- read & follow direction 1  Write a sentence 1  Copy design 1  Total score 29       The patient is oriented to person, place, and time;     recent and remote memory intact;     language fluent;     normal attention, concentration,     fund of knowledge Cranial Nerves:    The pupils are equal, round, and reactive to light.pupils too small to visualize fundi. Visual fields are full to finger confrontation. Extraocular movements are intact. Trigeminal sensation is intact and the muscles of mastication are normal. The face is symmetric. The palate elevates in the midline. Hearing intact. Voice is normal. Shoulder shrug is normal. The tongue has normal motion without fasciculations.   Coordination:   Bradykinesia with FTN, difficulty with finger taps on the left (improved). Can heel and toe walk, imbalance  with tandem.   Gait: mild bradykinesias, low clearance and short stride, en-bloc turning, decreased arm swing (much improved, stride and arm swing is improved, turning improved)  Motor Observation: Left >right postural and kinetic tremor with FTN (improved now minimal)  Tone:     increased tone left arm with facilitation (improved today, tone appears normal even with facilitation)  Posture:    Posture is normal. normal erect    Strength: Left left hip and leg flexion mild weakness, improved left arm deltoid and biceps mild weakness not  appreciated today. Right strength is V/V in the upper and lower limbs.      Sensation: intact to LT     Reflex Exam:  DTR's:    Deep tendon reflexes in the upper and lower extremities are brisk bilaterally.   Toes:    The toes are downgoing bilaterally.   Clonus:    Clonus is absent.    Assessment/Plan:   [age] y.o. female here as requested by [name], MD for follow up of Parkinson's Disease.  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  - Her exam is improved/stable today.  - Some recent increased stress, moving, sold house, loss of dog. But she is settling into new residence - doing well on sinemet, Patient states she wears off about 1/2-3/4 hr before her next dose is due.increase to q4 -  Also she feels her cognition, decision making, word finding, forgetfullness, fatigue is worsening. We will increase sinemet to every 4 hours. She has started TMS and feels it is helping her mood. She hasn't been crying as much and she is feeling better. Patient requests follow up neurocognitive testing with Dr. [name], she is willing to wait 8 months to a year, she had testing in 2022 and feels cognition is worsening, she has parkinson's disease. - Has a psychiatrist Tamela Oddi and a talk therapist, TMS is helping - We reviewed the [facility] movement disorder notes and the appointment at [facility] together with her good friend last appointment. Her good friend is with her again today, she recently married and they are very  thrilled about that. - At prior appointment, reviewed resources in the community and I provided her with information on Parkinson's groups, Parkinson's specific exercise groups, she is taking advantage of all of these and is doing extremely well except for her stress as state above, she is working with a Orthoptist at [facility] to see if they can start a support group - went to PT for balance, gait disorder. Gait is much better today, she is doing excellent .- headaches resolved after sphenoidectomy. - Now established with Dr. Evern Bio at [facility] but will follow with me until/if needed to see [facility] again.   Meds ordered this encounter  Medications   carbidopa-levodopa (SINEMET IR) 25-100 MG tablet    Sig: Take 1 tablet by mouth every 4 (four) hours as needed.    Dispense:  180 tablet    Refill:  11   Orders Placed This Encounter  Procedures   Ambulatory referral to Neuropsychology    - Initial appointment details: Patient is parkinsonian on exam(decreased arm swing,  narrow gait with low clearance, left-sided weakness and decreased coordination, increased tone left arm with facilitation, slightly masked facies). She has a postural and action tremor. 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 We discussed motor and non motor symptoms, disease progression, treatments (dopamine), was seen at [facility] and will continue to follow with me  - Continue Sinemet very low dose, discussed side effects  - Important to keep active, activity may slow down progression,   - She moved to an independent living facility, lots of changes ongoing in her life  - make sure she has POA and HCPOA  - Gait abnormality : MRI cervical spine: MRI of the cervical spine shows some slight arthritic changes but the spinal cord is normal. I don;t see any problems in the cervical spine to be causing your symptoms. . (MRI brain normal) did not show any etiology for gait abnormality. Will refer to PT for  balance, gait disorder.    - Cognitive complaints: Blood work completed, formal memory testing was overall positive, will repeat when needed, Speech Therapy for difficulty with expression and reception  - Discussed fall risks, orthostatic symptoms in PD and constipation and other things to watch for. Stay hydrated.   - Recommended speech therapy for her difficulty getting words out  - Recommend common sense with driving and consider formal driving test if needed in the future. She has very good insight and judgement.  - regular skin tests, increased risk of skin cancer in PD patient's  - Fatigue: May consider sleep evaluation, she has very vivid dreams.  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   - Follow up with [name] for her anxiety  Meds ordered this encounter  Medications   carbidopa-levodopa (SINEMET IR) 25-100 MG tablet    Sig: Take 1 tablet by mouth every 4 (four) hours as needed.    Dispense:  180 tablet    Refill:  11   Orders Placed This Encounter  Procedures   Ambulatory referral to Neuropsychology     Cc: [name], MD,  [name], MD  Naomie Dean, MD  [facility] [address] [place], [place] [address]  Phone [phone] Fax [phone]  I spent over 40 minutes of face-to-face and non-face-to-face time with patient on the  1. Parkinson's disease without dyskinesia or fluctuating manifestations   2. Cognitive decline     diagnosis.  This included previsit chart review, lab review, study review, order entry, electronic health record documentation, patient education on the different diagnostic and therapeutic options, counseling and coordination of care, risks and benefits of management, compliance, or risk factor reduction

## 2022-10-17 NOTE — Patient Instructions (Signed)
Increase sinemet to every 4 hours as needed Referral back to Dr. [name]  Meds ordered this encounter  Medications   carbidopa-levodopa (SINEMET IR) 25-100 MG tablet    Sig: Take 1 tablet by mouth every 4 (four) hours as needed.    Dispense:  180 tablet    Refill:  11   Orders Placed This Encounter  Procedures   Ambulatory referral to Neuropsychology   Carbidopa; Levodopa Tablets What is this medication? CARBIDOPA; LEVODOPA (kar bi DOE pa; lee voe DOE pa) treats the symptoms of Parkinson disease. It works by increasing the amount of dopamine in your brain, a substance which helps manage body movements and coordination. This reduces the symptoms of Parkinson, such as body stiffness and tremors. This medicine may be used for other purposes; ask your health care provider or pharmacist if you have questions. COMMON BRAND NAME(S): Atamet, Dhivy, SINEMET What should I tell my care team before I take this medication? They need to know if you have any of these conditions: Depression or other mental health conditions Diabetes Glaucoma Heart disease, including history of a heart attack History of irregular heartbeat Kidney disease Liver disease Lung or breathing disease, such as asthma Narcolepsy Sleep apnea Stomach or intestine problems An unusual or allergic reaction to levodopa, carbidopa, other medications, foods, dyes, or preservatives Pregnant or trying to get pregnant Breastfeeding How should I use this medication? Take this medication by mouth with a glass of water. Follow the directions on the prescription label. Take your doses at regular intervals. Do not take your medication more often than directed. Do not stop taking except on the advice of your care team. One form of this medication is a breakable tablet. If your care team has prescribed this, this medication comes with INSTRUCTIONS FOR USE. Ask your pharmacist for directions on how to use this medicine. Read the  information carefully. Talk to your pharmacist or care team if you have questions. Talk to your care team about the use of this medication in children. Special care may be needed. Overdosage: If you think you have taken too much of this medicine contact a poison control center or emergency room at once. NOTE: This medicine is only for you. Do not share this medicine with others. What if I miss a dose? If you miss a dose, take it as soon as you can. If it is almost time for your next dose, take only that dose. Do not take double or extra doses. What may interact with this medication? Do not take this medication with any of the following: MAOIs, such as Marplan, Nardil, and Parnate Reserpine Tetrabenazine This medication may also interact with the following: Alcohol Droperidol Entacapone Iron supplements or multivitamins with iron Isoniazid, INH Linezolid Medications for blood pressure Medications for mental health conditions Medications that help you fall asleep Metoclopramide Papaverine Procarbazine Tedizolid Rasagiline Selegiline Tolcapone This list may not describe all possible interactions. Give your health care provider a list of all the medicines, herbs, non-prescription drugs, or dietary supplements you use. Also tell them if you smoke, drink alcohol, or use illegal drugs. Some items may interact with your medicine. What should I watch for while using this medication? Visit your care team for regular checks on your progress. Tell your care team if your symptoms do not start to get better or if they get worse. A severe reaction similar to neuroleptic malignant syndrome (NMS) may occur if you reduce the dose of or stop taking this medication too quickly.  Symptoms of NMS include high fever, stiff muscles, increased sweating, fast or irregular heartbeat, and confusion. Contact your care team right away if think you have NMS. This medication may affect your coordination, reaction  time, or judgment. Do not drive or operate machinery until you know how this medication affects you. Sit up or stand slowly to reduce the risk of dizzy or fainting spells. Drinking alcohol with this medication can increase the risk of these side effects. When taking this medication, you may fall asleep without notice. You may be doing activities, such as driving a car, talking, or eating. You may not feel drowsy before it happens. Contact your care team right away if this happens to you. There have been reports of increased sexual urges or other strong urges, such as gambling while taking this medication. If you experience any of these while taking this medication, you should report this to your care team as soon as possible. You may experience a 'wearing off' effect before it is time to take your next dose of this medication. You may also experience an 'on-off' effect where the medication seems to stop working for minutes to hours, then suddenly starts working again. Tell your care team if this happens to you. Your dose may need to be adjusted. Eating high protein foods may affect how this medication works. Tell your care team if you change your diet. If you have diabetes, you may get a false-positive result for sugar in your urine. Check with your care team. This medication can make your saliva, sweat, or urine look dark red or black. This is normal but may stain clothing or fabrics. This medication may cause low levels of vitamin B6 in your body. Make sure that you get enough vitamin B6 while you are taking this medication. Discuss the foods you eat and the vitamins you take with your care team. What side effects may I notice from receiving this medication? Side effects that you should report to your care team as soon as possible: Allergic reactions--skin rash, itching, hives, swelling of the face, lips, tongue, or throat Falling asleep during daily activities Heart rhythm changes--fast or irregular  heartbeat, dizziness, feeling faint or lightheaded, chest pain, trouble breathing Low blood pressure--dizziness, feeling faint or lightheaded, blurry vision Mood and behavior changes--anxiety, nervousness, confusion, hallucinations, irritability, hostility, thoughts of suicide or self-harm, worsening mood, feelings of depression New or worsening uncontrolled and repetitive movements of the face, mouth, or upper body Stomach bleeding--bloody or black, tar-like stools, vomiting blood or brown material that looks like coffee grounds Sudden eye pain or change in vision such as blurry vision, seeing halos around lights, vision loss Urges to engage in impulsive behaviors such as gambling, binge eating, sexual activity, or shopping in ways that are unusual for you Side effects that usually do not require medical attention (report to your care team if they continue or are bothersome): Dark red or black saliva, sweat, or urine Dizziness Drowsiness Headache Nausea This list may not describe all possible side effects. Call your doctor for medical advice about side effects. You may report side effects to FDA at 1-800-FDA-1088. Where should I keep my medication? Keep out of the reach of children. Store at room temperature between 15 and 30 degrees C (59 and 86 degrees F). Protect from light. Throw away any unused medication after the expiration date. NOTE: This sheet is a summary. It may not cover all possible information. If you have questions about this medicine, talk to your doctor,  pharmacist, or health care provider.  2023 Elsevier/Gold Standard (2008-01-17 00:00:00)

## 2022-10-17 NOTE — Telephone Encounter (Signed)
Referral sent to Dr. Marvetta Gibbons office, phone # [phone].

## 2022-10-18 ENCOUNTER — Encounter: Payer: Self-pay | Admitting: Neurology

## 2022-11-29 ENCOUNTER — Other Ambulatory Visit: Payer: Self-pay | Admitting: Family Medicine

## 2022-11-29 DIAGNOSIS — Z1231 Encounter for screening mammogram for malignant neoplasm of breast: Secondary | ICD-10-CM

## 2022-12-07 ENCOUNTER — Other Ambulatory Visit: Payer: Self-pay | Admitting: Family Medicine

## 2022-12-07 DIAGNOSIS — R1310 Dysphagia, unspecified: Secondary | ICD-10-CM

## 2022-12-25 ENCOUNTER — Other Ambulatory Visit: Payer: Medicare PPO

## 2022-12-28 ENCOUNTER — Other Ambulatory Visit: Payer: Medicare PPO

## 2023-01-01 ENCOUNTER — Ambulatory Visit
Admission: RE | Admit: 2023-01-01 | Discharge: 2023-01-01 | Disposition: A | Discharge status: Home / Self Care | Payer: Medicare PPO | Source: Ambulatory Visit | Attending: Family Medicine | Admitting: Family Medicine

## 2023-01-01 DIAGNOSIS — R1310 Dysphagia, unspecified: Secondary | ICD-10-CM

## 2023-01-22 ENCOUNTER — Ambulatory Visit
Admission: RE | Admit: 2023-01-22 | Discharge: 2023-01-22 | Disposition: A | Discharge status: Home / Self Care | Payer: Medicare PPO | Source: Ambulatory Visit | Attending: Family Medicine | Admitting: Family Medicine

## 2023-01-22 DIAGNOSIS — Z1231 Encounter for screening mammogram for malignant neoplasm of breast: Secondary | ICD-10-CM

## 2023-01-25 ENCOUNTER — Other Ambulatory Visit: Payer: Self-pay | Admitting: Family Medicine

## 2023-01-25 DIAGNOSIS — R928 Other abnormal and inconclusive findings on diagnostic imaging of breast: Secondary | ICD-10-CM

## 2023-02-06 ENCOUNTER — Ambulatory Visit
Admission: RE | Admit: 2023-02-06 | Discharge: 2023-02-06 | Disposition: A | Discharge status: Home / Self Care | Payer: Medicare PPO | Source: Ambulatory Visit | Attending: Family Medicine | Admitting: Family Medicine

## 2023-02-06 ENCOUNTER — Ambulatory Visit: Payer: Medicare PPO

## 2023-02-06 DIAGNOSIS — R928 Other abnormal and inconclusive findings on diagnostic imaging of breast: Secondary | ICD-10-CM

## 2023-03-27 ENCOUNTER — Encounter (HOSPITAL_BASED_OUTPATIENT_CLINIC_OR_DEPARTMENT_OTHER): Payer: Self-pay

## 2023-03-27 ENCOUNTER — Other Ambulatory Visit: Payer: Self-pay

## 2023-03-27 ENCOUNTER — Inpatient Hospital Stay (HOSPITAL_BASED_OUTPATIENT_CLINIC_OR_DEPARTMENT_OTHER)
Admission: EM | Admit: 2023-03-27 | Discharge: 2023-03-30 | DRG: 445 | Disposition: A | Discharge status: Home / Self Care | Payer: Medicare PPO | Attending: Student | Admitting: Student

## 2023-03-27 ENCOUNTER — Emergency Department (HOSPITAL_BASED_OUTPATIENT_CLINIC_OR_DEPARTMENT_OTHER): Payer: Medicare PPO

## 2023-03-27 DIAGNOSIS — I10 Essential (primary) hypertension: Secondary | ICD-10-CM | POA: Diagnosis present

## 2023-03-27 DIAGNOSIS — Z9842 Cataract extraction status, left eye: Secondary | ICD-10-CM

## 2023-03-27 DIAGNOSIS — F32A Depression, unspecified: Secondary | ICD-10-CM | POA: Diagnosis present

## 2023-03-27 DIAGNOSIS — Z79899 Other long term (current) drug therapy: Secondary | ICD-10-CM

## 2023-03-27 DIAGNOSIS — Z87891 Personal history of nicotine dependence: Secondary | ICD-10-CM

## 2023-03-27 DIAGNOSIS — K224 Dyskinesia of esophagus: Secondary | ICD-10-CM | POA: Diagnosis present

## 2023-03-27 DIAGNOSIS — E871 Hypo-osmolality and hyponatremia: Secondary | ICD-10-CM | POA: Diagnosis present

## 2023-03-27 DIAGNOSIS — K225 Diverticulum of esophagus, acquired: Secondary | ICD-10-CM | POA: Diagnosis present

## 2023-03-27 DIAGNOSIS — E860 Dehydration: Secondary | ICD-10-CM | POA: Diagnosis present

## 2023-03-27 DIAGNOSIS — R17 Unspecified jaundice: Secondary | ICD-10-CM | POA: Diagnosis present

## 2023-03-27 DIAGNOSIS — J309 Allergic rhinitis, unspecified: Secondary | ICD-10-CM | POA: Diagnosis present

## 2023-03-27 DIAGNOSIS — Z981 Arthrodesis status: Secondary | ICD-10-CM

## 2023-03-27 DIAGNOSIS — E861 Hypovolemia: Secondary | ICD-10-CM | POA: Diagnosis present

## 2023-03-27 DIAGNOSIS — Z808 Family history of malignant neoplasm of other organs or systems: Secondary | ICD-10-CM

## 2023-03-27 DIAGNOSIS — F419 Anxiety disorder, unspecified: Secondary | ICD-10-CM | POA: Diagnosis present

## 2023-03-27 DIAGNOSIS — Z96643 Presence of artificial hip joint, bilateral: Secondary | ICD-10-CM | POA: Diagnosis present

## 2023-03-27 DIAGNOSIS — E782 Mixed hyperlipidemia: Secondary | ICD-10-CM | POA: Diagnosis present

## 2023-03-27 DIAGNOSIS — Z8249 Family history of ischemic heart disease and other diseases of the circulatory system: Secondary | ICD-10-CM

## 2023-03-27 DIAGNOSIS — Z823 Family history of stroke: Secondary | ICD-10-CM

## 2023-03-27 DIAGNOSIS — Z880 Allergy status to penicillin: Secondary | ICD-10-CM

## 2023-03-27 DIAGNOSIS — I129 Hypertensive chronic kidney disease with stage 1 through stage 4 chronic kidney disease, or unspecified chronic kidney disease: Secondary | ICD-10-CM | POA: Diagnosis present

## 2023-03-27 DIAGNOSIS — Z807 Family history of other malignant neoplasms of lymphoid, hematopoietic and related tissues: Secondary | ICD-10-CM

## 2023-03-27 DIAGNOSIS — Z800 Family history of malignant neoplasm of digestive organs: Secondary | ICD-10-CM

## 2023-03-27 DIAGNOSIS — Z888 Allergy status to other drugs, medicaments and biological substances status: Secondary | ICD-10-CM

## 2023-03-27 DIAGNOSIS — E785 Hyperlipidemia, unspecified: Secondary | ICD-10-CM | POA: Diagnosis present

## 2023-03-27 DIAGNOSIS — C801 Malignant (primary) neoplasm, unspecified: Principal | ICD-10-CM

## 2023-03-27 DIAGNOSIS — M8580 Other specified disorders of bone density and structure, unspecified site: Secondary | ICD-10-CM | POA: Diagnosis present

## 2023-03-27 DIAGNOSIS — E876 Hypokalemia: Secondary | ICD-10-CM | POA: Diagnosis present

## 2023-03-27 DIAGNOSIS — Z820 Family history of epilepsy and other diseases of the nervous system: Secondary | ICD-10-CM

## 2023-03-27 DIAGNOSIS — G4700 Insomnia, unspecified: Secondary | ICD-10-CM | POA: Diagnosis present

## 2023-03-27 DIAGNOSIS — K831 Obstruction of bile duct: Secondary | ICD-10-CM | POA: Diagnosis not present

## 2023-03-27 DIAGNOSIS — N1831 Chronic kidney disease, stage 3a: Secondary | ICD-10-CM | POA: Diagnosis present

## 2023-03-27 DIAGNOSIS — Z9841 Cataract extraction status, right eye: Secondary | ICD-10-CM

## 2023-03-27 DIAGNOSIS — D378 Neoplasm of uncertain behavior of other specified digestive organs: Secondary | ICD-10-CM | POA: Diagnosis present

## 2023-03-27 DIAGNOSIS — R1319 Other dysphagia: Secondary | ICD-10-CM | POA: Diagnosis present

## 2023-03-27 DIAGNOSIS — Z811 Family history of alcohol abuse and dependence: Secondary | ICD-10-CM

## 2023-03-27 DIAGNOSIS — K8689 Other specified diseases of pancreas: Secondary | ICD-10-CM | POA: Insufficient documentation

## 2023-03-27 DIAGNOSIS — Z881 Allergy status to other antibiotic agents status: Secondary | ICD-10-CM

## 2023-03-27 DIAGNOSIS — Z882 Allergy status to sulfonamides status: Secondary | ICD-10-CM

## 2023-03-27 DIAGNOSIS — Z961 Presence of intraocular lens: Secondary | ICD-10-CM | POA: Diagnosis present

## 2023-03-27 DIAGNOSIS — R748 Abnormal levels of other serum enzymes: Secondary | ICD-10-CM | POA: Diagnosis present

## 2023-03-27 DIAGNOSIS — K5909 Other constipation: Secondary | ICD-10-CM | POA: Diagnosis present

## 2023-03-27 DIAGNOSIS — G20A1 Parkinson's disease without dyskinesia, without mention of fluctuations: Secondary | ICD-10-CM | POA: Diagnosis present

## 2023-03-27 DIAGNOSIS — K219 Gastro-esophageal reflux disease without esophagitis: Secondary | ICD-10-CM | POA: Diagnosis present

## 2023-03-27 LAB — URINALYSIS, ROUTINE W REFLEX MICROSCOPIC
Glucose, UA: NEGATIVE mg/dL
Hgb urine dipstick: NEGATIVE
Ketones, ur: NEGATIVE mg/dL
Leukocytes,Ua: NEGATIVE
Nitrite: NEGATIVE
Specific Gravity, Urine: 1.015 (ref 1.005–1.030)
pH: 6.5 (ref 5.0–8.0)

## 2023-03-27 LAB — COMPREHENSIVE METABOLIC PANEL
ALT: 124 U/L — ABNORMAL HIGH (ref 0–44)
AST: 199 U/L — ABNORMAL HIGH (ref 15–41)
Albumin: 4.4 g/dL (ref 3.5–5.0)
Alkaline Phosphatase: 244 U/L — ABNORMAL HIGH (ref 38–126)
Anion gap: 10 (ref 5–15)
BUN: 22 mg/dL (ref 8–23)
CO2: 27 mmol/L (ref 22–32)
Calcium: 10.1 mg/dL (ref 8.9–10.3)
Chloride: 90 mmol/L — ABNORMAL LOW (ref 98–111)
Creatinine, Ser: 1.34 mg/dL — ABNORMAL HIGH (ref 0.44–1.00)
GFR, Estimated: 41 mL/min — ABNORMAL LOW (ref >60)
Glucose, Bld: 119 mg/dL — ABNORMAL HIGH (ref 70–99)
Potassium: 4.7 mmol/L (ref 3.5–5.1)
Sodium: 127 mmol/L — ABNORMAL LOW (ref 135–145)
Total Bilirubin: 8.6 mg/dL — ABNORMAL HIGH (ref 0.3–1.2)
Total Protein: 7.9 g/dL (ref 6.5–8.1)

## 2023-03-27 LAB — CBC
HCT: 42.2 % (ref 36.0–46.0)
Hemoglobin: 14.6 g/dL (ref 12.0–15.0)
MCH: 32.4 pg (ref 26.0–34.0)
MCHC: 34.6 g/dL (ref 30.0–36.0)
MCV: 93.6 fL (ref 80.0–100.0)
Platelets: 407 10*3/uL — ABNORMAL HIGH (ref 150–400)
RBC: 4.51 MIL/uL (ref 3.87–5.11)
RDW: 12.2 % (ref 11.5–15.5)
WBC: 7.4 10*3/uL (ref 4.0–10.5)
nRBC: 0 % (ref 0.0–0.2)

## 2023-03-27 LAB — PROTIME-INR
INR: 0.9 (ref 0.8–1.2)
Prothrombin Time: 12.5 seconds (ref 11.4–15.2)

## 2023-03-27 LAB — LIPASE, BLOOD: Lipase: 17 U/L (ref 11–51)

## 2023-03-27 MED ORDER — IOHEXOL 300 MG/ML  SOLN
100.0000 mL | Freq: Once | INTRAMUSCULAR | Status: AC | PRN
  Administered 2023-03-27: 65 mL via INTRAVENOUS

## 2023-03-27 MED ORDER — SODIUM CHLORIDE 0.9 % IV BOLUS
1000.0000 mL | Freq: Once | INTRAVENOUS | Status: AC
  Administered 2023-03-27: 1000 mL via INTRAVENOUS

## 2023-03-27 NOTE — ED Provider Notes (Signed)
[facility] Provider Note   CSN: [id number] Arrival date & time: 03/27/23  1513     History  Chief Complaint  Patient presents with   Abdominal Pain    Marie Ford is a 77 y.o. female.  Pt is a 77 yo female with pmhx significant for Parkinson's disease, chronic headaches, anxiety, depression, insomnia, hypertension, impaired glucose tolerance, mixed hyperlipidemia, osteopenia, chronic constipation and cognitive impairment.  Pt has been having with n/v and back pain since March.  She was told she looked jaundiced today, so came in for eval.       Home Medications Prior to Admission medications   Medication Sig Start Date End Date Taking? Authorizing Provider  ALPRAZolam Prudy Feeler) 0.5 MG tablet Take 0.25 mg by mouth 3 (three) times daily. Takes as needed for sleep    [provider]  AUVELITY 45-105 MG TBCR Take 1 tablet by mouth every evening.    [provider]  buPROPion ER (WELLBUTRIN SR) 100 MG 12 hr tablet Take 100 mg by mouth in the morning. 08/11/21   [provider]  Calcium Carbonate Antacid (CALCIUM CARBONATE PO) Take by mouth.    [provider]  carbidopa-levodopa (SINEMET IR) 25-100 MG tablet Take 1 tablet by mouth every 4 (four) hours as needed. 10/17/22   Anson Fret, MD  estradiol (ESTRACE) 0.1 MG/GM vaginal cream Place vaginally. 08/27/22   [provider]  ezetimibe (ZETIA) 10 MG tablet ezetimibe 10 mg tablet  Take 1 tablet every day by oral route. 02/25/19   [provider]  Multiple Vitamin (MULTIVITAMIN WITH MINERALS) TABS tablet Take 1 tablet by mouth daily. Women's Multivitamin    [provider]  Polyethyl Glycol-Propyl Glycol (SYSTANE) 0.4-0.3 % SOLN Place 1 drop into both eyes 3 (three) times daily as needed (dry/irritated eyes.).    [provider]  pravastatin (PRAVACHOL) 80 MG tablet Take 80 mg by mouth every evening.     [provider]  triamcinolone (NASACORT) 55 MCG/ACT AERO nasal inhaler Place 2 sprays into the nose daily. Use at night Patient taking differently: Place 1 spray into the nose daily. Use at night 11/01/20   Drema Halon, MD  triamterene-hydrochlorothiazide (MAXZIDE-25) 37.5-25 MG tablet Take 1 tablet by mouth daily.    [provider]      Allergies    Erythromycin, Macrobid [nitrofurantoin], Keflex [cephalexin], Levofloxacin, Penicillins, and Sulfamethoxazole-trimethoprim    Review of Systems   Review of Systems  Gastrointestinal:  Positive for nausea and vomiting.  Musculoskeletal:  Positive for back pain.  All other systems reviewed and are negative.   Physical Exam Updated Vital Signs BP 139/84   Pulse 79   Temp 97.7 F (36.5 C) (Oral)   Resp 18   Ht 5\' 6"  (1.676 m)   Wt 63 kg   SpO2 100%   BMI 22.44 kg/m  Physical Exam Vitals and nursing note reviewed.  Constitutional:      Appearance: She is well-developed.  HENT:     Head: Normocephalic and atraumatic.     Mouth/Throat:     Mouth: Mucous membranes are moist.     Pharynx: Oropharynx is clear.  Eyes:     General: Scleral icterus present.  Cardiovascular:     Rate and Rhythm: Normal rate and regular rhythm.  Pulmonary:     Effort: Pulmonary effort is normal.     Breath sounds: Normal breath sounds.  Abdominal:  General: Abdomen is flat. Bowel sounds are normal.     Palpations: Abdomen is soft.     Tenderness: There is no abdominal tenderness.  Skin:    Capillary Refill: Capillary refill takes less than 2 seconds.     Coloration: Skin is jaundiced.  Neurological:     General: No focal deficit present.     Mental Status: She is alert and oriented to person, place, and time.  Psychiatric:        Mood and Affect: Mood normal.        Behavior: Behavior normal.     ED Results / Procedures / Treatments   Labs (all labs ordered are listed, but only abnormal results are displayed) Labs  Reviewed  COMPREHENSIVE METABOLIC PANEL - Abnormal; Notable for the following components:      Result Value   Sodium 127 (*)    Chloride 90 (*)    Glucose, Bld 119 (*)    Creatinine, Ser 1.34 (*)    AST 199 (*)    ALT 124 (*)    Alkaline Phosphatase 244 (*)    Total Bilirubin 8.6 (*)    GFR, Estimated 41 (*)    All other components within normal limits  CBC - Abnormal; Notable for the following components:   Platelets 407 (*)    All other components within normal limits  URINALYSIS, ROUTINE W REFLEX MICROSCOPIC - Abnormal; Notable for the following components:   Bilirubin Urine MODERATE (*)    Protein, ur TRACE (*)    All other components within normal limits  LIPASE, BLOOD    EKG None  Radiology CT ABDOMEN PELVIS W CONTRAST  Result Date: 03/27/2023 CLINICAL DATA:  Acute abdominal pain EXAM: CT ABDOMEN AND PELVIS WITH CONTRAST TECHNIQUE: Multidetector CT imaging of the abdomen and pelvis was performed using the standard protocol following bolus administration of intravenous contrast. RADIATION DOSE REDUCTION: This exam was performed according to the departmental dose-optimization program which includes automated exposure control, adjustment of the mA and/or kV according to patient size and/or use of iterative reconstruction technique. CONTRAST:  65mL OMNIPAQUE IOHEXOL 300 MG/ML  SOLN COMPARISON:  CT 07/10/2022 and older FINDINGS: Lower chest: There is some linear opacity lung bases likely scar or atelectasis. No pleural effusion. Hepatobiliary: Since the prior there is new moderate biliary ductal dilatation. The gallbladder is also dilated. No space-occupying liver lesion. Pancreas: There is severe atrophy of the pancreas which is progressive with severe ductal dilatation and a mass lesion in the pancreatic head measuring on series 2 image 21 2.4 x 1.8 cm. This measures 2.3 cm on coronal image 59 of series 5. Findings are consistent with a pancreatic neoplasm until proven otherwise. This  involves the course of the common duct with a focal stricture. There is some nodularity along the wall of the duct as well as seen coronal image 58. This abuts the portal venous confluence without narrowing or encasement. The SMA is preserved. Spleen: Normal in size without focal abnormality. Adrenals/Urinary Tract: Slight thickening of the adrenal glands. No enhancing renal mass or collecting system dilatation. The bladder is grossly preserved but is partially obscured by the significant streak artifact from the bilateral hip arthroplasties. Stomach/Bowel: Moderate colonic stool. Few colonic diverticula. No bowel obstruction. Normal appendix in the right lower quadrant stomach is underdistended. Small bowel is nondilated. Vascular/Lymphatic: Normal caliber aorta and IVC with scattered vascular calcifications. There are some small porta hepatic lymph nodes but none that are pathologically enlarged by size criteria. Reproductive:  Uterus is present. No obvious adnexal mass but again portions of the pelvis are obscured by the streak artifact from the bilateral hip arthroplasties. Other: No free air or free fluid. Musculoskeletal: Curvature of the spine. Moderate degenerative changes. Bilateral hip arthroplasties. IMPRESSION: Low-attenuation mass centered along the pancreatic head with involvement of the distal common duct with the associated biliary duct ductal dilatation and pancreatic atrophy. Changes are worrisome for neoplasm either a pancreatic neoplasm or cholangiocarcinoma. Recommend further evaluation. No liver metastases. No pathologically enlarged upper abdominal nodes. Diffuse colonic stool.  Normal appendix.  Few diverticula. Portions of the pelvis are obscured by the streak artifact from the bilateral hip arthroplasties. Critical Value/emergent results were called by telephone at the time of interpretation on 03/27/2023 at 2:11 pm to provider [facility] , who verbally acknowledged these results.  Electronically Signed   By: Karen Kays M.D.   On: 03/27/2023 17:43    Procedures Procedures    Medications Ordered in ED Medications  sodium chloride 0.9 % bolus 1,000 mL (0 mLs Intravenous Stopped 03/27/23 1727)  iohexol (OMNIPAQUE) 300 MG/ML solution 100 mL (65 mLs Intravenous Contrast Given 03/27/23 1646)    ED Course/ Medical Decision Making/ A&P                             Medical Decision Making Amount and/or Complexity of Data Reviewed Labs: ordered. Radiology: ordered.  Risk Prescription drug management. Decision regarding hospitalization.   This patient presents to the ED for concern of jaundice, this involves an extensive number of treatment options, and is a complaint that carries with it a high risk of complications and morbidity.  The differential diagnosis includes pancreatic cancer, cholangitis, choledocholithiasis   Co morbidities that complicate the patient evaluation  Parkinson's disease, chronic headaches, anxiety, depression, insomnia, hypertension, impaired glucose tolerance, mixed hyperlipidemia, osteopenia, chronic constipation and cognitive impairment    Additional history obtained:  Additional history obtained from epic chart review External records from outside source obtained and reviewed including friend   Lab Tests:  I Ordered, and personally interpreted labs.  The pertinent results include:  cbc nl, ua +mod bili, CMP with Na low at 127, lfts elevated with AST 199, ALT 124, AP 244 and TB 8.6   Imaging Studies ordered:  I ordered imaging studies including ct abd/pelvis  I independently visualized and interpreted imaging which showed  Low-attenuation mass centered along the pancreatic head with  involvement of the distal common duct with the associated biliary  duct ductal dilatation and pancreatic atrophy. Changes are worrisome  for neoplasm either a pancreatic neoplasm or cholangiocarcinoma.  Recommend further evaluation.    No liver  metastases. No pathologically enlarged upper abdominal  nodes.    Diffuse colonic stool.  Normal appendix.  Few diverticula.    Portions of the pelvis are obscured by the streak artifact from the  bilateral hip arthroplasties.   I agree with the radiologist interpretation   Cardiac Monitoring:  The patient was maintained on a cardiac monitor.  I personally viewed and interpreted the cardiac monitored which showed an underlying rhythm of: nsr   Medicines ordered and prescription drug management:  I ordered medication including IVFs  for sx  Reevaluation of the patient after these medicines showed that the patient improved I have reviewed the patients home medicines and have made adjustments as needed   Test Considered:  ct   Consultations Obtained:  I requested consultation with [facility] (  Dr. Myrtie Neither),  and discussed lab and imaging findings as well as pertinent plan - he said he has no preference as to admission hospital.  They will see her in consult in the am.  Keep her NPO after midnight in case they can do an ERCP. Pt d/w Dr. Maryjean Ka ([facility]) for admission.   Problem List / ED Course:  Obstructive jaundice:  likely due to a malignancy.  Pt will need an ERCP for stent and tissue sampling.  No evidence of spread.     Reevaluation:  After the interventions noted above, I reevaluated the patient and found that they have :improved   Social Determinants of Health:  Lives in a retirement community   Dispostion:  After consideration of the diagnostic results and the patients response to treatment, I feel that the patent would benefit from admission.          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

## 2023-03-27 NOTE — Progress Notes (Signed)
[facility] accepted for transfer to [facility] health earlier this evening for jaundice, abd mass work up. Await arrival.

## 2023-03-27 NOTE — ED Triage Notes (Signed)
Patient arrives to ED POV C/O Abdominal pain, N/V/D and lower back pain since March. Pt lives at [place] as was told by the nurse to come to ED due to pt being Jaundice. Hx of HTN. No other complaints at this time. Pt A/O x4.

## 2023-03-27 NOTE — ED Notes (Signed)
[facility] at bedside to transport pt to [facility] 

## 2023-03-28 ENCOUNTER — Encounter (HOSPITAL_COMMUNITY): Payer: Self-pay | Admitting: Internal Medicine

## 2023-03-28 DIAGNOSIS — R17 Unspecified jaundice: Secondary | ICD-10-CM | POA: Diagnosis not present

## 2023-03-28 DIAGNOSIS — K831 Obstruction of bile duct: Secondary | ICD-10-CM | POA: Diagnosis present

## 2023-03-28 DIAGNOSIS — E871 Hypo-osmolality and hyponatremia: Secondary | ICD-10-CM | POA: Diagnosis present

## 2023-03-28 DIAGNOSIS — G20A1 Parkinson's disease without dyskinesia, without mention of fluctuations: Secondary | ICD-10-CM | POA: Diagnosis present

## 2023-03-28 DIAGNOSIS — R1319 Other dysphagia: Secondary | ICD-10-CM

## 2023-03-28 DIAGNOSIS — F32A Depression, unspecified: Secondary | ICD-10-CM | POA: Diagnosis not present

## 2023-03-28 DIAGNOSIS — C801 Malignant (primary) neoplasm, unspecified: Secondary | ICD-10-CM

## 2023-03-28 DIAGNOSIS — Z87891 Personal history of nicotine dependence: Secondary | ICD-10-CM | POA: Diagnosis not present

## 2023-03-28 DIAGNOSIS — Z9841 Cataract extraction status, right eye: Secondary | ICD-10-CM | POA: Diagnosis not present

## 2023-03-28 DIAGNOSIS — Z961 Presence of intraocular lens: Secondary | ICD-10-CM | POA: Diagnosis present

## 2023-03-28 DIAGNOSIS — E782 Mixed hyperlipidemia: Secondary | ICD-10-CM

## 2023-03-28 DIAGNOSIS — G20C Parkinsonism, unspecified: Secondary | ICD-10-CM

## 2023-03-28 DIAGNOSIS — K838 Other specified diseases of biliary tract: Secondary | ICD-10-CM | POA: Diagnosis not present

## 2023-03-28 DIAGNOSIS — Z880 Allergy status to penicillin: Secondary | ICD-10-CM | POA: Diagnosis not present

## 2023-03-28 DIAGNOSIS — E876 Hypokalemia: Secondary | ICD-10-CM | POA: Diagnosis present

## 2023-03-28 DIAGNOSIS — K8689 Other specified diseases of pancreas: Secondary | ICD-10-CM | POA: Diagnosis present

## 2023-03-28 DIAGNOSIS — I10 Essential (primary) hypertension: Secondary | ICD-10-CM

## 2023-03-28 DIAGNOSIS — F418 Other specified anxiety disorders: Secondary | ICD-10-CM | POA: Diagnosis not present

## 2023-03-28 DIAGNOSIS — I129 Hypertensive chronic kidney disease with stage 1 through stage 4 chronic kidney disease, or unspecified chronic kidney disease: Secondary | ICD-10-CM | POA: Diagnosis present

## 2023-03-28 DIAGNOSIS — J309 Allergic rhinitis, unspecified: Secondary | ICD-10-CM | POA: Diagnosis present

## 2023-03-28 DIAGNOSIS — R748 Abnormal levels of other serum enzymes: Secondary | ICD-10-CM | POA: Diagnosis present

## 2023-03-28 DIAGNOSIS — Z9842 Cataract extraction status, left eye: Secondary | ICD-10-CM | POA: Diagnosis not present

## 2023-03-28 DIAGNOSIS — Z881 Allergy status to other antibiotic agents status: Secondary | ICD-10-CM | POA: Diagnosis not present

## 2023-03-28 DIAGNOSIS — Z981 Arthrodesis status: Secondary | ICD-10-CM | POA: Diagnosis not present

## 2023-03-28 DIAGNOSIS — J301 Allergic rhinitis due to pollen: Secondary | ICD-10-CM

## 2023-03-28 DIAGNOSIS — Z888 Allergy status to other drugs, medicaments and biological substances status: Secondary | ICD-10-CM | POA: Diagnosis not present

## 2023-03-28 DIAGNOSIS — D378 Neoplasm of uncertain behavior of other specified digestive organs: Secondary | ICD-10-CM | POA: Diagnosis present

## 2023-03-28 DIAGNOSIS — N1831 Chronic kidney disease, stage 3a: Secondary | ICD-10-CM | POA: Diagnosis present

## 2023-03-28 DIAGNOSIS — Z96643 Presence of artificial hip joint, bilateral: Secondary | ICD-10-CM | POA: Diagnosis present

## 2023-03-28 DIAGNOSIS — Z882 Allergy status to sulfonamides status: Secondary | ICD-10-CM | POA: Diagnosis not present

## 2023-03-28 DIAGNOSIS — N189 Chronic kidney disease, unspecified: Secondary | ICD-10-CM | POA: Diagnosis not present

## 2023-03-28 DIAGNOSIS — E860 Dehydration: Secondary | ICD-10-CM | POA: Diagnosis present

## 2023-03-28 LAB — COMPREHENSIVE METABOLIC PANEL
ALT: 97 U/L — ABNORMAL HIGH (ref 0–44)
AST: 195 U/L — ABNORMAL HIGH (ref 15–41)
Albumin: 3.6 g/dL (ref 3.5–5.0)
Alkaline Phosphatase: 223 U/L — ABNORMAL HIGH (ref 38–126)
Anion gap: 11 (ref 5–15)
BUN: 18 mg/dL (ref 8–23)
CO2: 26 mmol/L (ref 22–32)
Calcium: 9.5 mg/dL (ref 8.9–10.3)
Chloride: 95 mmol/L — ABNORMAL LOW (ref 98–111)
Creatinine, Ser: 1.19 mg/dL — ABNORMAL HIGH (ref 0.44–1.00)
GFR, Estimated: 47 mL/min — ABNORMAL LOW (ref >60)
Glucose, Bld: 99 mg/dL (ref 70–99)
Potassium: 3.5 mmol/L (ref 3.5–5.1)
Sodium: 132 mmol/L — ABNORMAL LOW (ref 135–145)
Total Bilirubin: 7.9 mg/dL — ABNORMAL HIGH (ref 0.3–1.2)
Total Protein: 7 g/dL (ref 6.5–8.1)

## 2023-03-28 LAB — MAGNESIUM: Magnesium: 2 mg/dL (ref 1.7–2.4)

## 2023-03-28 LAB — RAPID URINE DRUG SCREEN, HOSP PERFORMED
Amphetamines: NOT DETECTED
Barbiturates: NOT DETECTED
Benzodiazepines: NOT DETECTED
Cocaine: NOT DETECTED
Opiates: NOT DETECTED
Tetrahydrocannabinol: NOT DETECTED

## 2023-03-28 LAB — CBC WITH DIFFERENTIAL/PLATELET
Abs Immature Granulocytes: 0.03 10*3/uL (ref 0.00–0.07)
Basophils Absolute: 0 10*3/uL (ref 0.0–0.1)
Basophils Relative: 0 %
Eosinophils Absolute: 0.1 10*3/uL (ref 0.0–0.5)
Eosinophils Relative: 2 %
HCT: 41.1 % (ref 36.0–46.0)
Hemoglobin: 14.6 g/dL (ref 12.0–15.0)
Immature Granulocytes: 0 %
Lymphocytes Relative: 31 %
Lymphs Abs: 2.2 10*3/uL (ref 0.7–4.0)
MCH: 32.5 pg (ref 26.0–34.0)
MCHC: 35.5 g/dL (ref 30.0–36.0)
MCV: 91.5 fL (ref 80.0–100.0)
Monocytes Absolute: 1.2 10*3/uL — ABNORMAL HIGH (ref 0.1–1.0)
Monocytes Relative: 17 %
Neutro Abs: 3.7 10*3/uL (ref 1.7–7.7)
Neutrophils Relative %: 50 %
Platelets: 412 10*3/uL — ABNORMAL HIGH (ref 150–400)
RBC: 4.49 MIL/uL (ref 3.87–5.11)
RDW: 12.2 % (ref 11.5–15.5)
WBC: 7.3 10*3/uL (ref 4.0–10.5)
nRBC: 0 % (ref 0.0–0.2)

## 2023-03-28 LAB — OSMOLALITY: Osmolality: 288 mOsm/kg (ref 275–295)

## 2023-03-28 LAB — TSH: TSH: 3.074 u[IU]/mL (ref 0.350–4.500)

## 2023-03-28 LAB — OSMOLALITY, URINE: Osmolality, Ur: 221 mOsm/kg — ABNORMAL LOW (ref 300–900)

## 2023-03-28 LAB — PROTIME-INR
INR: 1 (ref 0.8–1.2)
Prothrombin Time: 13.3 seconds (ref 11.4–15.2)

## 2023-03-28 LAB — SODIUM, URINE, RANDOM: Sodium, Ur: 46 mmol/L

## 2023-03-28 LAB — CREATININE, URINE, RANDOM: Creatinine, Urine: 29 mg/dL

## 2023-03-28 LAB — BILIRUBIN, DIRECT: Bilirubin, Direct: 4.1 mg/dL — ABNORMAL HIGH (ref 0.0–0.2)

## 2023-03-28 LAB — GAMMA GT: GGT: 513 U/L — ABNORMAL HIGH (ref 7–50)

## 2023-03-28 MED ORDER — SODIUM CHLORIDE 0.9 % IV SOLN: INTRAVENOUS | Status: DC

## 2023-03-28 MED ORDER — FENTANYL CITRATE PF 50 MCG/ML IJ SOSY: 25.0000 ug | PREFILLED_SYRINGE | INTRAMUSCULAR | Status: DC | PRN

## 2023-03-28 MED ORDER — ALPRAZOLAM 0.25 MG PO TABS
0.2500 mg | ORAL_TABLET | Freq: Every day | ORAL | Status: DC | PRN
  Administered 2023-03-28: 0.25 mg via ORAL
  Filled 2023-03-28: qty 1

## 2023-03-28 MED ORDER — CARBIDOPA-LEVODOPA 25-100 MG PO TABS
1.0000 | ORAL_TABLET | Freq: Three times a day (TID) | ORAL | Status: DC
  Administered 2023-03-28 (×2): 1 via ORAL
  Filled 2023-03-28 (×2): qty 1

## 2023-03-28 MED ORDER — ONDANSETRON HCL 4 MG/2ML IJ SOLN: 4.0000 mg | Freq: Four times a day (QID) | INTRAMUSCULAR | Status: DC | PRN

## 2023-03-28 MED ORDER — TRIAMCINOLONE ACETONIDE 55 MCG/ACT NA AERO
1.0000 | INHALATION_SPRAY | Freq: Every day | NASAL | Status: DC
  Administered 2023-03-28 – 2023-03-29 (×2): 1 via NASAL
  Filled 2023-03-28: qty 10.8

## 2023-03-28 MED ORDER — MELATONIN 3 MG PO TABS
3.0000 mg | ORAL_TABLET | Freq: Every evening | ORAL | Status: DC | PRN
  Filled 2023-03-28: qty 1

## 2023-03-28 MED ORDER — ACETAMINOPHEN 325 MG PO TABS
650.0000 mg | ORAL_TABLET | Freq: Four times a day (QID) | ORAL | Status: DC | PRN
  Administered 2023-03-28 (×2): 650 mg via ORAL
  Filled 2023-03-28 (×2): qty 2

## 2023-03-28 MED ORDER — NALOXONE HCL 0.4 MG/ML IJ SOLN: 0.4000 mg | INTRAMUSCULAR | Status: DC | PRN

## 2023-03-28 MED ORDER — ACETAMINOPHEN 650 MG RE SUPP: 650.0000 mg | Freq: Four times a day (QID) | RECTAL | Status: DC | PRN

## 2023-03-28 MED ORDER — CARBIDOPA-LEVODOPA 25-100 MG PO TABS
1.0000 | ORAL_TABLET | Freq: Four times a day (QID) | ORAL | Status: DC
  Administered 2023-03-28 – 2023-03-30 (×7): 1 via ORAL
  Filled 2023-03-28 (×7): qty 1

## 2023-03-28 MED ORDER — LACTATED RINGERS IV SOLN: INTRAVENOUS | Status: DC

## 2023-03-28 MED ORDER — SODIUM CHLORIDE 0.9 % IV SOLN
1.0000 g | Freq: Once | INTRAVENOUS | Status: AC
  Administered 2023-03-29: 1 g via INTRAVENOUS
  Filled 2023-03-28: qty 10

## 2023-03-28 NOTE — H&P (View-Only) (Signed)
Referring Provider: ? EDP Primary Care Physician:  [name], MD Primary Gastroenterologist:  Dr. Orvan Falconer  Reason for Consultation:  Biliary obstruction  HPI: Marie Ford is a 77 y.o. female with medical history significant for essential hypertension, hyperlipidemia, Parkinson's disease who is admitted to [facility] on 03/27/2023 by way of transfer from [facility] emergency department with obstructive jaundice after presenting from home complaining of jaundice.  Says that a friend told her that she looked yellow and her urine has been dark for about a week.  She notes that she has been experiencing 1 month She notes that she has been experiencing one month of intermittent nausea resulting in intermittent episodes of non-bloody emesis.  Has been using zofran at home.  Has no appetite, about 3 pound weight loss.  Sodium 132, creatinine 1.19, alkaline phosphatase 244->223, total bilirubin Ruben 8.6->7.9, AST 199->195, ALT 124->97. Lipase 17.  Hemoglobin 14.6 grams. INR 0.9.   CT abdomen/pelvis with contrast showed evidence of a mass centered along the pancreatic head with involvement of the distal common bile duct, with associated biliary ductal dilation in the absence of choledocholithiasis.  No evidence of acute cholecystitis.   Of note, esophagram from January showed the following:  IMPRESSION: Moderate to severe gastroesophageal reflux. No evidence of hiatal hernia or distal esophageal stricture.   Moderate esophageal dysmotility.   Mild narrowing of the proximal cervical esophagus at the level of C5, although this does not impede passage of barium.   Tiny Zenker diverticulum.  She does admit to some mild issues with swallowing, sometimes foods feel like they get hung high in the esophagus.  Past Medical History:  Diagnosis Date   Chronic kidney disease    stage 3    Chronic sphenoidal sinusitis    Degenerative arthritis of hip    requirde surgery 2009    Depression 2010   Diplopia 2011   in left lateral gaze    Diplopia    " my eyes dont track together, i have corrective prisms in my eye glasses"    Diverticulosis 2006   noted in a colonoscopy   Esophageal reflux 2013   Hypertension    IGT (impaired glucose tolerance) 2011   denies hx of diabetes    Insomnia 2009   Mixed hyperlipidemia 2000   Osteoarthritis    Osteopenia 2005   Other and unspecified hyperlipidemia 2000   Other specified disease of sebaceous glands    Palpitations 2009   secondary to PSVT   Parkinson disease    PNA (pneumonia) 2014   Psoriasis 2004   PSVT (paroxysmal supraventricular tachycardia) 2009   "report it has been some time since i had that "    Situational stress    Venous insufficiency since 2017    Past Surgical History:  Procedure Laterality Date   CATARACT EXTRACTION W/ INTRAOCULAR LENS  IMPLANT, BILATERAL     COLONOSCOPY  01/30/2010   Jarold Motto   ETHMOIDECTOMY Left 11/17/2020   Procedure: LEFT SIDED TOTAL ETHMOIDECTOMY;  Surgeon: Drema Halon, MD;  Location: [facility];  Service: ENT;  Laterality: Left;   EYE SURGERY     HEMORRHOID SURGERY     JOINT REPLACEMENT     LASIK     left medial and superior rectus muscle recesion for diplopia correction     SINUS ENDO WITH FUSION Left 11/17/2020   Procedure: SINUS ENDOSCOPY WITH FUSION NAVIGATION;  Surgeon: Drema Halon, MD;  Location: [facility];  Service: ENT;  Laterality: Left;   SPHENOIDECTOMY Left 11/17/2020   Procedure: LEFT SIDED SPHENOIDECTOMY WITH TISSUE REMOVAL;  Surgeon: Drema Halon, MD;  Location: [facility];  Service: ENT;  Laterality: Left;   surgery of right eye rectus muscles for diplpia correction   2019   TOTAL HIP ARTHROPLASTY Right 2010   TOTAL HIP ARTHROPLASTY Left 08/19/2019   Procedure: TOTAL HIP ARTHROPLASTY ANTERIOR APPROACH;  Surgeon: Ollen Gross, MD;  Location: [facility];  Service: Orthopedics;   Laterality: Left;    TURBINATE REDUCTION Left 11/17/2020   Procedure: LEFT SIDED TURBINATE REDUCTION;  Surgeon: Drema Halon, MD;  Location: [facility];  Service: ENT;  Laterality: Left;   URETHRAL SLING     midurethral sling with TVT Exact and Cystoscopy    Prior to Admission medications   Medication Sig Start Date End Date Taking? Authorizing Provider  ALPRAZolam Prudy Feeler) 0.5 MG tablet Take 0.25 mg by mouth 3 (three) times daily. Takes as needed for sleep    [provider]  AUVELITY 45-105 MG TBCR Take 1 tablet by mouth every evening.    [provider]  buPROPion ER (WELLBUTRIN SR) 100 MG 12 hr tablet Take 100 mg by mouth in the morning. 08/11/21   [provider]  Calcium Carbonate Antacid (CALCIUM CARBONATE PO) Take by mouth.    [provider]  carbidopa-levodopa (SINEMET IR) 25-100 MG tablet Take 1 tablet by mouth every 4 (four) hours as needed. 10/17/22   Anson Fret, MD  estradiol (ESTRACE) 0.1 MG/GM vaginal cream Place vaginally. 08/27/22   [provider]  ezetimibe (ZETIA) 10 MG tablet ezetimibe 10 mg tablet  Take 1 tablet every day by oral route. 02/25/19   [provider]  Multiple Vitamin (MULTIVITAMIN WITH MINERALS) TABS tablet Take 1 tablet by mouth daily. Women's Multivitamin    [provider]  Polyethyl Glycol-Propyl Glycol (SYSTANE) 0.4-0.3 % SOLN Place 1 drop into both eyes 3 (three) times daily as needed (dry/irritated eyes.).    [provider]  pravastatin (PRAVACHOL) 80 MG tablet Take 80 mg by mouth every evening.     [provider]  triamcinolone (NASACORT) 55 MCG/ACT AERO nasal inhaler Place 2 sprays into the nose daily. Use at night Patient taking differently: Place 1 spray into the nose daily. Use at night 11/01/20   Drema Halon, MD  triamterene-hydrochlorothiazide (MAXZIDE-25) 37.5-25 MG tablet Take 1 tablet by mouth daily.    [provider]    Current Facility-Administered Medications  Medication Dose Route Frequency Provider Last Rate Last Admin   0.9 %  sodium chloride infusion   Intravenous Continuous Gonfa, Taye T, MD       acetaminophen (TYLENOL) tablet 650 mg  650 mg Oral Q6H PRN Howerter, Justin B, DO       Or   acetaminophen (TYLENOL) suppository 650 mg  650 mg Rectal Q6H PRN Howerter, Justin B, DO       carbidopa-levodopa (SINEMET IR) 25-100 MG per tablet immediate release 1 tablet  1 tablet Oral TID Almon Hercules, MD   1 tablet at 03/28/23 0807   fentaNYL (SUBLIMAZE) injection 25 mcg  25 mcg Intravenous Q2H PRN Howerter, Justin B, DO       melatonin tablet 3 mg  3 mg Oral QHS PRN Howerter, Justin B, DO       naloxone (NARCAN) injection 0.4 mg  0.4 mg Intravenous PRN Howerter, Justin B, DO  ondansetron (ZOFRAN) injection 4 mg  4 mg Intravenous Q6H PRN Howerter, Justin B, DO       triamcinolone (NASACORT) nasal inhaler 1 spray  1 spray Each Nare QHS Howerter, Justin B, DO        Allergies as of 03/27/2023 - Review Complete 03/27/2023  Allergen Reaction Noted   Erythromycin Shortness Of Breath and Rash    Macrobid [nitrofurantoin] Other (See Comments) 12/23/2017   Keflex [cephalexin] Nausea And Vomiting 03/27/2023   Levofloxacin Nausea Only 10/04/2021   Penicillins Rash    Sulfamethoxazole-trimethoprim Rash 10/19/2020    Family History  Problem Relation Age of Onset   Congestive Heart Failure Mother    CVA Father    Heart failure Father    Non-Hodgkin's lymphoma Sister    Alcohol abuse Sister    Liver cancer Brother    Colon cancer Brother 17   Colitis Brother    Hypertension Brother    Gout Brother    Melanoma Daughter    Parkinson's disease Cousin    Esophageal cancer Neg Hx    Rectal cancer Neg Hx    Stomach cancer Neg Hx    Colon polyps Neg Hx     Social History   Socioeconomic History   Marital status: Single    Spouse name: Not on file   Number of children: 1   Years  of education: Not on file   Highest education level: Doctorate  Occupational History   Occupation: retired  Tobacco Use   Smoking status: Never   Smokeless tobacco: Never  Vaping Use   Vaping Use: Never used  Substance and Sexual Activity   Alcohol use: Yes    Comment: rarely   Drug use: No   Sexual activity: Not Currently    Birth control/protection: Post-menopausal  Other Topics Concern   Not on file  Social History Narrative   Lives at [facility]   Right handed   Caffeine: rarely   Social Determinants of Health   Financial Resource Strain: Not on file  Food Insecurity: Not on file  Transportation Needs: Not on file  Physical Activity: Not on file  Stress: Not on file  Social Connections: Not on file  Intimate Partner Violence: Not on file    Review of Systems: ROS is O/W negative except as mentioned in HPI.  Physical Exam: Vital signs in last 24 hours: Temp:  [97.7 F (36.5 C)-98.2 F (36.8 C)] 98.2 F (36.8 C) (04/18 0441) Pulse Rate:  [61-80] 69 (04/18 0441) Resp:  [17-20] 20 (04/18 0441) BP: (129-164)/(74-113) 148/82 (04/18 0441) SpO2:  [95 %-100 %] 95 % (04/18 0441) Weight:  [63 kg-64.2 kg] 64.2 kg (04/18 0041) Last BM Date : 03/26/23 General:  Alert, Well-developed, well-nourished, pleasant and cooperative in NAD Head:  Normocephalic and atraumatic. Eyes:  Scleral icterus noted. Ears:  Normal auditory acuity. Mouth:  No deformity or lesions.   Lungs:  Clear throughout to auscultation.  No wheezes, crackles, or rhonchi.  Heart:  Regular rate and rhythm; no murmurs, clicks, rubs, or gallops. Abdomen:  Soft, non-distended. BS present.  Non-tender.   Msk:  Symmetrical without gross deformities. Pulses:  Normal pulses noted. Extremities:  Without clubbing or edema. Neurologic:  Alert and oriented x 4;  grossly normal neurologically. Skin:  Intact without significant lesions or rashes. Psych:  Alert and cooperative. Normal mood and  affect.  Intake/Output from previous day: 04/17 0701 - 04/18 0700 In: 1296.8 [I.V.:292.3; IV Piggyback:1004.5] Out: 600 [Urine:600]  Lab  Results: Recent Labs    03/27/23 1527 03/28/23 0244  WBC 7.4 7.3  HGB 14.6 14.6  HCT 42.2 41.1  PLT 407* 412*   BMET Recent Labs    03/27/23 1527 03/28/23 0244  NA 127* 132*  K 4.7 3.5  CL 90* 95*  CO2 27 26  GLUCOSE 119* 99  BUN 22 18  CREATININE 1.34* 1.19*  CALCIUM 10.1 9.5   LFT Recent Labs    03/28/23 0244  PROT 7.0  ALBUMIN 3.6  AST 195*  ALT 97*  ALKPHOS 223*  BILITOT 7.9*  BILIDIR 4.1*   PT/INR Recent Labs    03/27/23 1845 03/28/23 0244  LABPROT 12.5 13.3  INR 0.9 1.0   Studies/Results: CT ABDOMEN PELVIS W CONTRAST  Result Date: 03/27/2023 CLINICAL DATA:  Acute abdominal pain EXAM: CT ABDOMEN AND PELVIS WITH CONTRAST TECHNIQUE: Multidetector CT imaging of the abdomen and pelvis was performed using the standard protocol following bolus administration of intravenous contrast. RADIATION DOSE REDUCTION: This exam was performed according to the departmental dose-optimization program which includes automated exposure control, adjustment of the mA and/or kV according to patient size and/or use of iterative reconstruction technique. CONTRAST:  65mL OMNIPAQUE IOHEXOL 300 MG/ML  SOLN COMPARISON:  CT 07/10/2022 and older FINDINGS: Lower chest: There is some linear opacity lung bases likely scar or atelectasis. No pleural effusion. Hepatobiliary: Since the prior there is new moderate biliary ductal dilatation. The gallbladder is also dilated. No space-occupying liver lesion. Pancreas: There is severe atrophy of the pancreas which is progressive with severe ductal dilatation and a mass lesion in the pancreatic head measuring on series 2 image 21 2.4 x 1.8 cm. This measures 2.3 cm on coronal image 59 of series 5. Findings are consistent with a pancreatic neoplasm until proven otherwise. This involves the course of the common duct with  a focal stricture. There is some nodularity along the wall of the duct as well as seen coronal image 58. This abuts the portal venous confluence without narrowing or encasement. The SMA is preserved. Spleen: Normal in size without focal abnormality. Adrenals/Urinary Tract: Slight thickening of the adrenal glands. No enhancing renal mass or collecting system dilatation. The bladder is grossly preserved but is partially obscured by the significant streak artifact from the bilateral hip arthroplasties. Stomach/Bowel: Moderate colonic stool. Few colonic diverticula. No bowel obstruction. Normal appendix in the right lower quadrant stomach is underdistended. Small bowel is nondilated. Vascular/Lymphatic: Normal caliber aorta and IVC with scattered vascular calcifications. There are some small porta hepatic lymph nodes but none that are pathologically enlarged by size criteria. Reproductive: Uterus is present. No obvious adnexal mass but again portions of the pelvis are obscured by the streak artifact from the bilateral hip arthroplasties. Other: No free air or free fluid. Musculoskeletal: Curvature of the spine. Moderate degenerative changes. Bilateral hip arthroplasties. IMPRESSION: Low-attenuation mass centered along the pancreatic head with involvement of the distal common duct with the associated biliary duct ductal dilatation and pancreatic atrophy. Changes are worrisome for neoplasm either a pancreatic neoplasm or cholangiocarcinoma. Recommend further evaluation. No liver metastases. No pathologically enlarged upper abdominal nodes. Diffuse colonic stool.  Normal appendix.  Few diverticula. Portions of the pelvis are obscured by the streak artifact from the bilateral hip arthroplasties. Critical Value/emergent results were called by telephone at the time of interpretation on 03/27/2023 at 2:11 pm to provider [facility] , who verbally acknowledged these results. Electronically Signed   By: Karen Kays M.D.   On:  03/27/2023  17:43    IMPRESSION:  *Biliary obstruction with CT scan showing a pancreatic head mass with involvement of the distal common bile duct: LFTs elevated with total bili around 8.  ? Pancreatic cancer vs cholangio. *Mild esophageal dysphagia with tiny Zenkers and cervical esophageal narrowing on esophagram 12/2022 *Parkinson's disease  PLAN: -Planning for ERCP with Dr. Marina Goodell tomorrow afternoon, 4/19.  We discussed the esophageal findings seen on previous esophagram and how those could potentially pose an issue with being able to proceed with EGD. -Trend labs/LFTs. -I have ordered a periprocedural dose of cefepime for the time of her ERCP.  I spoke to pharmacy directly and they said that she has received these before without issues.   Princella Pellegrini. [facility]  03/28/2023, 10:04 AM

## 2023-03-28 NOTE — Progress Notes (Signed)
PROGRESS NOTE  Marie Ford ZOX:[id number] DOB: 1946/09/09   PCP: [name], MD  Patient is from: Home  DOA: 03/27/2023 LOS: 0  Chief complaints Chief Complaint  Patient presents with   Abdominal Pain     Brief Narrative / Interim history: 77 year old F with PMH of HTN, HLD, Parkinson disease, tobacco use disorder and depression who presented to [facility] with jaundice for about a week.  She noted that she has been experiencing abdominal cramping, intermittent nausea and vomiting for about a months.  Admitted for obstructive jaundice.  Total bili 8.6.  ALP 244.  AST 199.  ALT 124.  Lipase normal.  CT abdomen and pelvis with contrast concerning for mass centered along the pancreatic head with involvement of the distal CBD and associated biliary ductal dilation without choledocholithiasis or evidence of acute cholecystitis.  [facility] consulted, and recommended admission to hospitalist service and n.p.o.     Subjective: Seen and examined earlier this morning.  No major events overnight of this morning.  No complaints.  Denies pain, nausea, vomiting, chest pain, shortness of breath or UTI symptoms.  Objective: Vitals:   03/27/23 2330 03/28/23 0041 03/28/23 0441 03/28/23 0920  BP: 135/85 (!) 162/83 (!) 148/82 137/80  Pulse: 61 71 69 72  Resp: 17  20 18   Temp:  97.8 F (36.6 C) 98.2 F (36.8 C) 98 F (36.7 C)  TempSrc:  Oral Oral Oral  SpO2: 99% 97% 95% 96%  Weight:  64.2 kg    Height:  5\' 6"  (1.676 m)      Examination:  GENERAL: No apparent distress.  Nontoxic. HEENT: MMM.  Sclera icteric. NECK: Supple.  No apparent JVD.  RESP:  No IWOB.  Fair aeration bilaterally. CVS:  RRR. Heart sounds normal.  ABD/GI/GU: BS+. Abd soft, NTND.  MSK/EXT:  Moves extremities. No apparent deformity. No edema.  SKIN: Notable jaundice in face NEURO: Awake, alert and oriented appropriately.  No apparent focal neuro deficit. PSYCH: Calm. Normal affect.   Procedures:  None    Microbiology summarized: None  Assessment and plan: Principal Problem:   Obstructive jaundice Active Problems:   Essential hypertension   Hyperlipidemia   Parkinson disease   Depression   Acute hyponatremia   Allergic rhinitis   Pancreatic mass  Obstructive jaundice due to pancreatic mass concerning for malignancy: Reports normal colo over 10 years ago. Hyperbilirubinemia/elevated liver enzymes: Due to the above.  GGT elevated to 551. -[facility] on board. -Plan for ERCP on 4/19 -Continue monitoring  Parkinson's disease -Continue home meds  Essential hypertension: Normotensive off meds. -Continue monitoring  Acute hyponatremia: Likely iatrogenic from Maxzide..  Improved. -Continue holding Maxzide -Recheck in the morning  Hyperlipidemia -Hold statin in the setting of elevated liver enzymes  CKD-3A: Stable. Recent Labs    03/27/23 1527 03/28/23 0244  BUN 22 18  CREATININE 1.34* 1.19*  -Continue monitoring    Body mass index is 22.84 kg/m.          DVT prophylaxis:  SCDs Start: 03/28/23 0147  Code Status: Full code Family Communication: None at bedside Level of care: Med-Surg Status is: Inpatient Remains inpatient appropriate because: Obstructive jaundice and pancreatic mass   Final disposition: TBD Consultants:  Gastroenterology  55 minutes with more than 50% spent in reviewing records, counseling patient/family and coordinating care.   Sch Meds:  Scheduled Meds:  carbidopa-levodopa  1 tablet Oral TID   triamcinolone  1 spray Each Nare QHS   Continuous Infusions:  sodium chloride  75 mL/hr at 03/28/23 1111   [START ON 03/29/2023] ceFEPime (MAXIPIME) IV     PRN Meds:.acetaminophen **OR** acetaminophen, fentaNYL (SUBLIMAZE) injection, melatonin, naLOXone (NARCAN)  injection, ondansetron (ZOFRAN) IV  Antimicrobials: Anti-infectives (From admission, onward)    Start     Dose/Rate Route Frequency Ordered Stop   03/29/23 1330  ceFEPIme  (MAXIPIME) 1 g in sodium chloride 0.9 % 100 mL IVPB        1 g 200 mL/hr over 30 Minutes Intravenous  Once 03/28/23 1022          I have personally reviewed the following labs and images: CBC: Recent Labs  Lab 03/27/23 1527 03/28/23 0244  WBC 7.4 7.3  NEUTROABS  --  3.7  HGB 14.6 14.6  HCT 42.2 41.1  MCV 93.6 91.5  PLT 407* 412*   BMP &GFR Recent Labs  Lab 03/27/23 1527 03/28/23 0244  NA 127* 132*  K 4.7 3.5  CL 90* 95*  CO2 27 26  GLUCOSE 119* 99  BUN 22 18  CREATININE 1.34* 1.19*  CALCIUM 10.1 9.5  MG  --  2.0   Estimated Creatinine Clearance: 36.5 mL/min (A) (by C-G formula based on SCr of 1.19 mg/dL (H)). Liver & Pancreas: Recent Labs  Lab 03/27/23 1527 03/28/23 0244  AST 199* 195*  ALT 124* 97*  ALKPHOS 244* 223*  BILITOT 8.6* 7.9*  PROT 7.9 7.0  ALBUMIN 4.4 3.6   Recent Labs  Lab 03/27/23 1527  LIPASE 17   No results for input(s): "AMMONIA" in the last 168 hours. Diabetic: No results for input(s): "HGBA1C" in the last 72 hours. No results for input(s): "GLUCAP" in the last 168 hours. Cardiac Enzymes: No results for input(s): "CKTOTAL", "CKMB", "CKMBINDEX", "TROPONINI" in the last 168 hours. No results for input(s): "PROBNP" in the last 8760 hours. Coagulation Profile: Recent Labs  Lab 03/27/23 1845 03/28/23 0244  INR 0.9 1.0   Thyroid Function Tests: Recent Labs    03/28/23 0244  TSH 3.074   Lipid Profile: No results for input(s): "CHOL", "HDL", "LDLCALC", "TRIG", "CHOLHDL", "LDLDIRECT" in the last 72 hours. Anemia Panel: No results for input(s): "VITAMINB12", "FOLATE", "FERRITIN", "TIBC", "IRON", "RETICCTPCT" in the last 72 hours. Urine analysis:    Component Value Date/Time   COLORURINE YELLOW 03/27/2023 1527   APPEARANCEUR CLEAR 03/27/2023 1527   LABSPEC 1.015 03/27/2023 1527   PHURINE 6.5 03/27/2023 1527   GLUCOSEU NEGATIVE 03/27/2023 1527   HGBUR NEGATIVE 03/27/2023 1527   BILIRUBINUR MODERATE (A) 03/27/2023 1527    KETONESUR NEGATIVE 03/27/2023 1527   PROTEINUR TRACE (A) 03/27/2023 1527   UROBILINOGEN 0.2 11/02/2009 0552   NITRITE NEGATIVE 03/27/2023 1527   LEUKOCYTESUR NEGATIVE 03/27/2023 1527   Sepsis Labs: Invalid input(s): "PROCALCITONIN", "LACTICIDVEN"  Microbiology: No results found for this or any previous visit (from the past 240 hour(s)).  Radiology Studies: CT ABDOMEN PELVIS W CONTRAST  Result Date: 03/27/2023 CLINICAL DATA:  Acute abdominal pain EXAM: CT ABDOMEN AND PELVIS WITH CONTRAST TECHNIQUE: Multidetector CT imaging of the abdomen and pelvis was performed using the standard protocol following bolus administration of intravenous contrast. RADIATION DOSE REDUCTION: This exam was performed according to the departmental dose-optimization program which includes automated exposure control, adjustment of the mA and/or kV according to patient size and/or use of iterative reconstruction technique. CONTRAST:  65mL OMNIPAQUE IOHEXOL 300 MG/ML  SOLN COMPARISON:  CT 07/10/2022 and older FINDINGS: Lower chest: There is some linear opacity lung bases likely scar or atelectasis. No pleural effusion. Hepatobiliary: Since the  prior there is new moderate biliary ductal dilatation. The gallbladder is also dilated. No space-occupying liver lesion. Pancreas: There is severe atrophy of the pancreas which is progressive with severe ductal dilatation and a mass lesion in the pancreatic head measuring on series 2 image 21 2.4 x 1.8 cm. This measures 2.3 cm on coronal image 59 of series 5. Findings are consistent with a pancreatic neoplasm until proven otherwise. This involves the course of the common duct with a focal stricture. There is some nodularity along the wall of the duct as well as seen coronal image 58. This abuts the portal venous confluence without narrowing or encasement. The SMA is preserved. Spleen: Normal in size without focal abnormality. Adrenals/Urinary Tract: Slight thickening of the adrenal glands.  No enhancing renal mass or collecting system dilatation. The bladder is grossly preserved but is partially obscured by the significant streak artifact from the bilateral hip arthroplasties. Stomach/Bowel: Moderate colonic stool. Few colonic diverticula. No bowel obstruction. Normal appendix in the right lower quadrant stomach is underdistended. Small bowel is nondilated. Vascular/Lymphatic: Normal caliber aorta and IVC with scattered vascular calcifications. There are some small porta hepatic lymph nodes but none that are pathologically enlarged by size criteria. Reproductive: Uterus is present. No obvious adnexal mass but again portions of the pelvis are obscured by the streak artifact from the bilateral hip arthroplasties. Other: No free air or free fluid. Musculoskeletal: Curvature of the spine. Moderate degenerative changes. Bilateral hip arthroplasties. IMPRESSION: Low-attenuation mass centered along the pancreatic head with involvement of the distal common duct with the associated biliary duct ductal dilatation and pancreatic atrophy. Changes are worrisome for neoplasm either a pancreatic neoplasm or cholangiocarcinoma. Recommend further evaluation. No liver metastases. No pathologically enlarged upper abdominal nodes. Diffuse colonic stool.  Normal appendix.  Few diverticula. Portions of the pelvis are obscured by the streak artifact from the bilateral hip arthroplasties. Critical Value/emergent results were called by telephone at the time of interpretation on 03/27/2023 at 2:11 pm to provider [facility] , who verbally acknowledged these results. Electronically Signed   By: Karen Kays M.D.   On: 03/27/2023 17:43      Dejour Vos T. Garlin [facility]f [facility]  If 7PM-7AM, please contact night-coverage [email or web address] 03/28/2023, 2:54 PM

## 2023-03-28 NOTE — H&P (Signed)
History and Physical      Marie Ford:[id number] DOB: 10/09/1946 DOA: 03/27/2023  PCP: [name], MD  Patient coming from: home   I have personally briefly reviewed patient's old medical records in [facility]  Chief Complaint: Jaundice  HPI: Marie Ford is a 77 y.o. female with medical history significant for essential hypertension, hyperlipidemia, Parkinson's disease, who is admitted to [facility] on 03/27/2023 by way of transfer from [facility] emergency department with obstructive jaundice after presenting from home to to the latter facility complaining of jaundice.   The patient reports that a friend told her she has started to appear jaundiced over the last few days, prompting the patient to present to [facility] emergency department for further evaluation management thereof.  She notes that she has been experiencing 1 month of vague, generalized crampy abdominal discomfort with radiation into the back.  She notes that this has been associate with intermittent nausea resulting in intermittent episodes of nonbloody, nonbilious emesis, with most recent such episode occurring within the last 1 to 2 days.  Denies any associated hematemesis or any associated coffee-ground appearance associated with the emesis.  Denies any recent melena or hematochezia.  No recent preceding trauma.  Denies any associated subjective fever, chills, rigors, or generalized myalgias.  No recent diarrhea, melena, or hematochezia.  Denies any associated dysuria or gross hematuria.  No known history of underlying malignancy.  Denies any routine alcohol consumption, and conveys that she is a lifelong non-smoker.  Per chart review, most recent prior liver enzymes were checked in February 2023, at which time they were notable for the following: Alkaline phosphatase 54, total bilirubin 1.1, AST 18, ALT 3.    [facility] ED Course:  Vital signs in the ED were notable for the following:  Afebrile; heart rate in the 60s to 80s; systolic blood pressures in the 120s to 160s; respiratory rate 17-18, oxygen saturation 97 to 100% on room air.  Labs were notable for the following: CMP notable for the following: Sodium 127 compared to most recent prior value of 141 in February 2023, chloride 90, bicarbonate 27, creatinine 1.34 compared to most recent prior serum creatinine data point of 1.25 in February 2023, glucose 119, alkaline phosphatase 244, total bilirubin Ruben 8.6, AST 199, ALT 124.  Lipase 17.  CBC notable for the following: Open cell count 7400, hemoglobin 14.6.  INR 0.9.  Urinalysis notable for no white blood cells, leukocyte esterase/nitrate negative.  Imaging and additional notable ED work-up: CT abdomen/pelvis with contrast showed evidence of a mass centered along the pancreatic head with involvement of the distal common bile duct, with associated biliary ductal dilation in the absence of choledocholithiasis.  No evidence of acute cholecystitis.  [facility] EDP discussed patient's case with the on-call [place] gastroenterologist, Dr. Myrtie Neither , Who recommended [facility] admission, and conveyed that [facility] gastroenterology will formally consult and see the patient in the morning (03/28/23), requesting that the patient be kept n.p.o. after midnight in case [place] elects to pursue ERCP.  While in the ED, the following were administered: Normal saline x 1 L bolus.  Subsequently, the patient was admitted to [facility] for further evaluation management of obstructive jaundice and new finding of intra-abdominal mass, with presenting labs also notable for acute hyponatremia.     Review of Systems: As per HPI otherwise 10 point review of systems negative.   Past Medical History:  Diagnosis Date   Chronic kidney disease    stage 3  Chronic sphenoidal sinusitis    Degenerative arthritis of hip    requirde surgery 2009   Depression 2010   Diplopia 2011   in left lateral gaze     Diplopia    " my eyes dont track together, i have corrective prisms in my eye glasses"    Diverticulosis 2006   noted in a colonoscopy   Esophageal reflux 2013   Hypertension    IGT (impaired glucose tolerance) 2011   denies hx of diabetes    Insomnia 2009   Mixed hyperlipidemia 2000   Osteoarthritis    Osteopenia 2005   Other and unspecified hyperlipidemia 2000   Other specified disease of sebaceous glands    Palpitations 2009   secondary to PSVT   Parkinson disease    PNA (pneumonia) 2014   Psoriasis 2004   PSVT (paroxysmal supraventricular tachycardia) 2009   "report it has been some time since i had that "    Situational stress    Venous insufficiency since 2017    Past Surgical History:  Procedure Laterality Date   CATARACT EXTRACTION W/ INTRAOCULAR LENS  IMPLANT, BILATERAL     COLONOSCOPY  01/30/2010   Jarold Motto   ETHMOIDECTOMY Left 11/17/2020   Procedure: LEFT SIDED TOTAL ETHMOIDECTOMY;  Surgeon: Drema Halon, MD;  Location: [facility];  Service: ENT;  Laterality: Left;   EYE SURGERY     HEMORRHOID SURGERY     JOINT REPLACEMENT     LASIK     left medial and superior rectus muscle recesion for diplopia correction     SINUS ENDO WITH FUSION Left 11/17/2020   Procedure: SINUS ENDOSCOPY WITH FUSION NAVIGATION;  Surgeon: Drema Halon, MD;  Location: [facility];  Service: ENT;  Laterality: Left;   SPHENOIDECTOMY Left 11/17/2020   Procedure: LEFT SIDED SPHENOIDECTOMY WITH TISSUE REMOVAL;  Surgeon: Drema Halon, MD;  Location: [facility];  Service: ENT;  Laterality: Left;   surgery of right eye rectus muscles for diplpia correction   2019   TOTAL HIP ARTHROPLASTY Right 2010   TOTAL HIP ARTHROPLASTY Left 08/19/2019   Procedure: TOTAL HIP ARTHROPLASTY ANTERIOR APPROACH;  Surgeon: Ollen Gross, MD;  Location: [facility];  Service: Orthopedics;  Laterality: Left;    TURBINATE REDUCTION Left 11/17/2020    Procedure: LEFT SIDED TURBINATE REDUCTION;  Surgeon: Drema Halon, MD;  Location: [facility];  Service: ENT;  Laterality: Left;   URETHRAL SLING     midurethral sling with TVT Exact and Cystoscopy    Social History:  reports that she has never smoked. She has never used smokeless tobacco. She reports current alcohol use. She reports that she does not use drugs.   Allergies  Allergen Reactions   Erythromycin Shortness Of Breath and Rash   Macrobid [Nitrofurantoin] Other (See Comments)    Pass out   Keflex [Cephalexin] Nausea And Vomiting   Levofloxacin Nausea Only   Penicillins Rash    Did it involve swelling of the face/tongue/throat, SOB, or low BP? No Did it involve sudden or severe rash/hives, skin peeling, or any reaction on the inside of your mouth or nose? No Did you need to seek medical attention at a hospital or doctor's office? No When did it last happen? More than 10 years ago If all above answers are "NO", may proceed with cephalosporin use.    Sulfamethoxazole-Trimethoprim Rash    Family History  Problem Relation Age of Onset   Congestive  Heart Failure Mother    CVA Father    Heart failure Father    Non-Hodgkin's lymphoma Sister    Alcohol abuse Sister    Liver cancer Brother    Colon cancer Brother 85   Colitis Brother    Hypertension Brother    Gout Brother    Melanoma Daughter    Parkinson's disease Cousin    Esophageal cancer Neg Hx    Rectal cancer Neg Hx    Stomach cancer Neg Hx    Colon polyps Neg Hx     Family history reviewed and not pertinent    Prior to Admission medications   Medication Sig Start Date End Date Taking? Authorizing Provider  ALPRAZolam Prudy Feeler) 0.5 MG tablet Take 0.25 mg by mouth 3 (three) times daily. Takes as needed for sleep    [provider]  AUVELITY 45-105 MG TBCR Take 1 tablet by mouth every evening.    [provider]  buPROPion ER (WELLBUTRIN SR) 100 MG 12 hr tablet Take 100  mg by mouth in the morning. 08/11/21   [provider]  Calcium Carbonate Antacid (CALCIUM CARBONATE PO) Take by mouth.    [provider]  carbidopa-levodopa (SINEMET IR) 25-100 MG tablet Take 1 tablet by mouth every 4 (four) hours as needed. 10/17/22   Anson Fret, MD  estradiol (ESTRACE) 0.1 MG/GM vaginal cream Place vaginally. 08/27/22   [provider]  ezetimibe (ZETIA) 10 MG tablet ezetimibe 10 mg tablet  Take 1 tablet every day by oral route. 02/25/19   [provider]  Multiple Vitamin (MULTIVITAMIN WITH MINERALS) TABS tablet Take 1 tablet by mouth daily. Women's Multivitamin    [provider]  Polyethyl Glycol-Propyl Glycol (SYSTANE) 0.4-0.3 % SOLN Place 1 drop into both eyes 3 (three) times daily as needed (dry/irritated eyes.).    [provider]  pravastatin (PRAVACHOL) 80 MG tablet Take 80 mg by mouth every evening.     [provider]  triamcinolone (NASACORT) 55 MCG/ACT AERO nasal inhaler Place 2 sprays into the nose daily. Use at night Patient taking differently: Place 1 spray into the nose daily. Use at night 11/01/20   Drema Halon, MD  triamterene-hydrochlorothiazide (MAXZIDE-25) 37.5-25 MG tablet Take 1 tablet by mouth daily.    [provider]     Objective    Physical Exam: Vitals:   03/27/23 2300 03/27/23 2315 03/27/23 2330 03/28/23 0041  BP: 131/82 130/88 135/85 (!) 162/83  Pulse: 65 68 61 71  Resp:  18 17   Temp:    97.8 F (36.6 C)  TempSrc:    Oral  SpO2: 97% 98% 99% 97%  Weight:    64.2 kg  Height:     (1.676 m)    General: appears to be stated age; alert, oriented Skin: warm, dry, jaundiced appearance Head:  AT/[place] Mouth:  Oral mucosa membranes appear moist, normal dentition Neck: supple; trachea midline Heart:  RRR; did not appreciate any M/R/G Lungs: CTAB, did not appreciate any wheezes, rales, or rhonchi Abdomen: + BS; soft, ND, NT Vascular: 2+ pedal pulses  b/l; 2+ radial pulses b/l Extremities: no peripheral edema, no muscle wasting Neuro: strength and sensation intact in upper and lower extremities b/l     Labs on Admission: I have personally reviewed following labs and imaging studies  CBC: Recent Labs  Lab 03/27/23 1527  WBC 7.4  HGB 14.6  HCT 42.2  MCV 93.6  PLT 407*   Basic Metabolic  Panel: Recent Labs  Lab 03/27/23 1527  NA 127*  K 4.7  CL 90*  CO2 27  GLUCOSE 119*  BUN 22  CREATININE 1.34*  CALCIUM 10.1   GFR: Estimated Creatinine Clearance: 32.4 mL/min (A) (by C-G formula based on SCr of 1.34 mg/dL (H)). Liver Function Tests: Recent Labs  Lab 03/27/23 1527  AST 199*  ALT 124*  ALKPHOS 244*  BILITOT 8.6*  PROT 7.9  ALBUMIN 4.4   Recent Labs  Lab 03/27/23 1527  LIPASE 17   No results for input(s): "AMMONIA" in the last 168 hours. Coagulation Profile: Recent Labs  Lab 03/27/23 1845  INR 0.9   Cardiac Enzymes: No results for input(s): "CKTOTAL", "CKMB", "CKMBINDEX", "TROPONINI" in the last 168 hours. BNP (last 3 results) No results for input(s): "PROBNP" in the last 8760 hours. HbA1C: No results for input(s): "HGBA1C" in the last 72 hours. CBG: No results for input(s): "GLUCAP" in the last 168 hours. Lipid Profile: No results for input(s): "CHOL", "HDL", "LDLCALC", "TRIG", "CHOLHDL", "LDLDIRECT" in the last 72 hours. Thyroid Function Tests: No results for input(s): "TSH", "T4TOTAL", "FREET4", "T3FREE", "THYROIDAB" in the last 72 hours. Anemia Panel: No results for input(s): "VITAMINB12", "FOLATE", "FERRITIN", "TIBC", "IRON", "RETICCTPCT" in the last 72 hours. Urine analysis:    Component Value Date/Time   COLORURINE YELLOW 03/27/2023 1527   APPEARANCEUR CLEAR 03/27/2023 1527   LABSPEC 1.015 03/27/2023 1527   PHURINE 6.5 03/27/2023 1527   GLUCOSEU NEGATIVE 03/27/2023 1527   HGBUR NEGATIVE 03/27/2023 1527   BILIRUBINUR MODERATE (A) 03/27/2023 1527   KETONESUR NEGATIVE 03/27/2023 1527    PROTEINUR TRACE (A) 03/27/2023 1527   UROBILINOGEN 0.2 11/02/2009 0552   NITRITE NEGATIVE 03/27/2023 1527   LEUKOCYTESUR NEGATIVE 03/27/2023 1527    Radiological Exams on Admission: CT ABDOMEN PELVIS W CONTRAST  Result Date: 03/27/2023 CLINICAL DATA:  Acute abdominal pain EXAM: CT ABDOMEN AND PELVIS WITH CONTRAST TECHNIQUE: Multidetector CT imaging of the abdomen and pelvis was performed using the standard protocol following bolus administration of intravenous contrast. RADIATION DOSE REDUCTION: This exam was performed according to the departmental dose-optimization program which includes automated exposure control, adjustment of the mA and/or kV according to patient size and/or use of iterative reconstruction technique. CONTRAST:  65mL OMNIPAQUE IOHEXOL 300 MG/ML  SOLN COMPARISON:  CT 07/10/2022 and older FINDINGS: Lower chest: There is some linear opacity lung bases likely scar or atelectasis. No pleural effusion. Hepatobiliary: Since the prior there is new moderate biliary ductal dilatation. The gallbladder is also dilated. No space-occupying liver lesion. Pancreas: There is severe atrophy of the pancreas which is progressive with severe ductal dilatation and a mass lesion in the pancreatic head measuring on series 2 image 21 2.4 x 1.8 cm. This measures 2.3 cm on coronal image 59 of series 5. Findings are consistent with a pancreatic neoplasm until proven otherwise. This involves the course of the common duct with a focal stricture. There is some nodularity along the wall of the duct as well as seen coronal image 58. This abuts the portal venous confluence without narrowing or encasement. The SMA is preserved. Spleen: Normal in size without focal abnormality. Adrenals/Urinary Tract: Slight thickening of the adrenal glands. No enhancing renal mass or collecting system dilatation. The bladder is grossly preserved but is partially obscured by the significant streak artifact from the bilateral hip  arthroplasties. Stomach/Bowel: Moderate colonic stool. Few colonic diverticula. No bowel obstruction. Normal appendix in the right lower quadrant stomach is underdistended. Small bowel is nondilated. Vascular/Lymphatic: Normal  caliber aorta and IVC with scattered vascular calcifications. There are some small porta hepatic lymph nodes but none that are pathologically enlarged by size criteria. Reproductive: Uterus is present. No obvious adnexal mass but again portions of the pelvis are obscured by the streak artifact from the bilateral hip arthroplasties. Other: No free air or free fluid. Musculoskeletal: Curvature of the spine. Moderate degenerative changes. Bilateral hip arthroplasties. IMPRESSION: Low-attenuation mass centered along the pancreatic head with involvement of the distal common duct with the associated biliary duct ductal dilatation and pancreatic atrophy. Changes are worrisome for neoplasm either a pancreatic neoplasm or cholangiocarcinoma. Recommend further evaluation. No liver metastases. No pathologically enlarged upper abdominal nodes. Diffuse colonic stool.  Normal appendix.  Few diverticula. Portions of the pelvis are obscured by the streak artifact from the bilateral hip arthroplasties. Critical Value/emergent results were called by telephone at the time of interpretation on 03/27/2023 at 2:11 pm to provider [facility] , who verbally acknowledged these results. Electronically Signed   By: Karen Kays M.D.   On: 03/27/2023 17:43      Assessment/Plan   Principal Problem:   Jaundice Active Problems:   Essential hypertension   Hyperlipidemia   Parkinson's disease   Depression   Acute hyponatremia   Allergic rhinitis     #) Obstructive jaundice: Patient presents with newly identified jaundice, with presenting liver enzymes showing new development of transaminitis with appearance of cholestatic pattern given significant elevation in total bilirubin as well as alkaline  phosphatase, representing interval changes since most recent prior set of liver enzymes approximately 14 months ago, as further quantified above.  This appears to be in the setting of obstructing abdominal mass, with today's CT abdomen/pelvis showing evidence of a mass centered along the pancreatic head and also involving the distal common bile duct, associated with biliary ductal dilation, without overt evidence of choledocholithiasis nor any evidence of acute cholecystitis.  Overall, presentation concerning for malignancy.  EDP at [facility] discussed patient's case with on-call [place] gastroenterology, who will formally consult and will see the patient in the morning, recommending n.p.o. after midnight, [facility]ie Alma may wish to pursue ERCP to further evaluate and obtain biopsy.   Presentation appears largely painless, with patient noting some very vague mild abdominal discomfort with radiation to the back.  Has been afebrile, and no clinical evidence to suggest ascending cholangitis.  Does not meet SIRS criteria for sepsis, and appears hemodynamically stable.   Plan: [place] gastroenterology to formally consult, as above.  N.p.o. after midnight per their request.  Lactated Ringer's at 75 cc/h x 10 hours.  Prn IV fentanyl.  Prn IV Zofran.  Repeat CMP in the morning.  INR.  Direct bilirubin, GGT.  Hold home statin for now in the setting of presenting acute transaminitis.  Urinary drug screen.               #) Acute hypo-osmolar hypovolemic hyponatremia: Presenting serum sodium level 127 compared to most recent prior value of 141 in February 2023. Suspect an element of hypovolumeia, with suspected contribution from dehydration in the setting of clinical evidence of such as well as report of recent decline in oral intake as well as increase in GI losses in the form of recent nausea/vomiting coinciding with the timeframe of decline of serum sodium levels.  Differential also includes the possibility of  a contribution from SIADH. in general, will provide gentle IV fluids to attend to suspected contribution from dehydration, while further evaluating for any additional contributing factors, including SIADH,  as further detailed below. Will also check TSH.  There is also the possibility of pharmacologic contribution from outpatient HCTZ.  Of note, no evidence of associated acute focal neurologic deficits and no report of recent trauma.    Plan: monitor strict I's and O's and daily weights.  check random urine sodium, urine osmolality.  Check serum osmolality to confirm suspected hypoosmolar etiology.  Repeat CMP in the morning. Check TSH. Gentle IVF's, as above.  Hold home HCTZ for now.                 #) Depression: documented h/o such. On Wellbutrin/dextromethorphan as outpatient.    Plan: In the setting of current n.p.o. status, will hold home antidepressant for now.               #) Essential Hypertension: documented h/o such, with outpatient antihypertensive regimen including triamterene and HCTZ.  SBP's in the ED today: 120s to 160s mmHg. in the setting of presenting acute hyponatremia, will hold home HCTZ for now.  Additionally, given current n.p.o. status, will also hold  triamterene for now.  Plan: Close monitoring of subsequent BP via routine VS. hold home HCTZ and triamterene for now, as further detailed above.                #) Hyperlipidemia: documented h/o such. On pravastatin and Zetia as outpatient.  Will hold home antilipid medications in the context of current n.p.o. status as well as presenting acute transaminitis.  Plan: Hold home statin and Zetia for now, as further outlined above.Marland Kitchen               #) Parkinson's disease: Documented history of such, on Sinemet as an outpatient.  Plan: In the setting of current n.p.o. status, will hold home Sinemet for now.                #) Allergic Rhinitis: documented h/o such,  on scheduled intranasal Nasacort as outpatient.    Plan: cont home Nasacort.      DVT prophylaxis: SCD's   Code Status: Full code Family Communication: none Disposition Plan: Per Rounding Team Consults called:  EDP at [facility] discussed patient's case and imaging with the on-call [place] gastroenterologist, Dr. Myrtie Neither, Who conveyed that [facility] will formally consult and see the patient in the morning (03/28/23);  Admission status: inpatient     I SPENT GREATER THAN 75  MINUTES IN CLINICAL CARE TIME/MEDICAL DECISION-MAKING IN COMPLETING THIS ADMISSION.      Chaney Born Eusebio Blazejewski DO [facility]  From 7PM - 7AM   03/28/2023, 2:27 AM

## 2023-03-28 NOTE — Consult Note (Signed)
 Referring Provider: ? EDP Primary Care Physician:  Blomgren, Peter, MD Primary Gastroenterologist:  Dr. Beavers  Reason for Consultation:  Biliary obstruction  HPI: Marie Ford is a 76 y.o. female with medical history significant for essential hypertension, hyperlipidemia, Parkinson's disease who is admitted to [facility] on 03/27/2023 by way of transfer from [facility] emergency department with obstructive jaundice after presenting from home complaining of jaundice.  Says that a friend told her that she looked yellow and her urine has been dark for about a week.  She notes that she has been experiencing 1 month She notes that she has been experiencing one month of intermittent nausea resulting in intermittent episodes of non-bloody emesis.  Has been using zofran at home.  Has no appetite, about 3 pound weight loss.  Sodium 132, creatinine 1.19, alkaline phosphatase 244->223, total bilirubin Ruben 8.6->7.9, AST 199->195, ALT 124->97. Lipase 17.  Hemoglobin 14.6 grams. INR 0.9.   CT abdomen/pelvis with contrast showed evidence of a mass centered along the pancreatic head with involvement of the distal common bile duct, with associated biliary ductal dilation in the absence of choledocholithiasis.  No evidence of acute cholecystitis.   Of note, esophagram from January showed the following:  IMPRESSION: Moderate to severe gastroesophageal reflux. No evidence of hiatal hernia or distal esophageal stricture.   Moderate esophageal dysmotility.   Mild narrowing of the proximal cervical esophagus at the level of C5, although this does not impede passage of barium.   Tiny Zenker diverticulum.  She does admit to some mild issues with swallowing, sometimes foods feel like they get hung high in the esophagus.  Past Medical History:  Diagnosis Date   Chronic kidney disease    stage 3    Chronic sphenoidal sinusitis    Degenerative arthritis of hip    requirde surgery 2009    Depression 2010   Diplopia 2011   in left lateral gaze    Diplopia    " my eyes dont track together, i have corrective prisms in my eye glasses"    Diverticulosis 2006   noted in a colonoscopy   Esophageal reflux 2013   Hypertension    IGT (impaired glucose tolerance) 2011   denies hx of diabetes    Insomnia 2009   Mixed hyperlipidemia 2000   Osteoarthritis    Osteopenia 2005   Other and unspecified hyperlipidemia 2000   Other specified disease of sebaceous glands    Palpitations 2009   secondary to PSVT   Parkinson disease    PNA (pneumonia) 2014   Psoriasis 2004   PSVT (paroxysmal supraventricular tachycardia) 2009   "report it has been some time since i had that "    Situational stress    Venous insufficiency since 2017    Past Surgical History:  Procedure Laterality Date   CATARACT EXTRACTION W/ INTRAOCULAR LENS  IMPLANT, BILATERAL     COLONOSCOPY  01/30/2010   [place]   ETHMOIDECTOMY Left 11/17/2020   Procedure: LEFT SIDED TOTAL ETHMOIDECTOMY;  Surgeon: Newman, Christopher E, MD;  Location: [facility];  Service: ENT;  Laterality: Left;   EYE SURGERY     HEMORRHOID SURGERY     JOINT REPLACEMENT     LASIK     left medial and superior rectus muscle recesion for diplopia correction     SINUS ENDO WITH FUSION Left 11/17/2020   Procedure: SINUS ENDOSCOPY WITH FUSION NAVIGATION;  Surgeon: Newman, Christopher E, MD;  Location: [facility];    Service: ENT;  Laterality: Left;   SPHENOIDECTOMY Left 11/17/2020   Procedure: LEFT SIDED SPHENOIDECTOMY WITH TISSUE REMOVAL;  Surgeon: Newman, Christopher E, MD;  Location: [facility];  Service: ENT;  Laterality: Left;   surgery of right eye rectus muscles for diplpia correction   2019   TOTAL HIP ARTHROPLASTY Right 2010   TOTAL HIP ARTHROPLASTY Left 08/19/2019   Procedure: TOTAL HIP ARTHROPLASTY ANTERIOR APPROACH;  Surgeon: Aluisio, Frank, MD;  Location: [facility];  Service: Orthopedics;   Laterality: Left;  100min   TURBINATE REDUCTION Left 11/17/2020   Procedure: LEFT SIDED TURBINATE REDUCTION;  Surgeon: Newman, Christopher E, MD;  Location:  SURGERY CENTER;  Service: ENT;  Laterality: Left;   URETHRAL SLING     midurethral sling with TVT Exact and Cystoscopy    Prior to Admission medications   Medication Sig Start Date End Date Taking? Authorizing Provider  ALPRAZolam (XANAX) 0.5 MG tablet Take 0.25 mg by mouth 3 (three) times daily. Takes as needed for sleep    [provider]  AUVELITY 45-105 MG TBCR Take 1 tablet by mouth every evening.    [provider]  buPROPion ER (WELLBUTRIN SR) 100 MG 12 hr tablet Take 100 mg by mouth in the morning. 08/11/21   [provider]  Calcium Carbonate Antacid (CALCIUM CARBONATE PO) Take by mouth.    [provider]  carbidopa-levodopa (SINEMET IR) 25-100 MG tablet Take 1 tablet by mouth every 4 (four) hours as needed. 10/17/22   Ahern, Antonia B, MD  estradiol (ESTRACE) 0.1 MG/GM vaginal cream Place vaginally. 08/27/22   [provider]  ezetimibe (ZETIA) 10 MG tablet ezetimibe 10 mg tablet  Take 1 tablet every day by oral route. 02/25/19   [provider]  Multiple Vitamin (MULTIVITAMIN WITH MINERALS) TABS tablet Take 1 tablet by mouth daily. Women's Multivitamin    [provider]  Polyethyl Glycol-Propyl Glycol (SYSTANE) 0.4-0.3 % SOLN Place 1 drop into both eyes 3 (three) times daily as needed (dry/irritated eyes.).    [provider]  pravastatin (PRAVACHOL) 80 MG tablet Take 80 mg by mouth every evening.     [provider]  triamcinolone (NASACORT) 55 MCG/ACT AERO nasal inhaler Place 2 sprays into the nose daily. Use at night Patient taking differently: Place 1 spray into the nose daily. Use at night 11/01/20   Newman, Christopher E, MD  triamterene-hydrochlorothiazide (MAXZIDE-25) 37.5-25 MG tablet Take 1 tablet by mouth daily.    [provider]    Current Facility-Administered Medications  Medication Dose Route Frequency Provider Last Rate Last Admin   0.9 %  sodium chloride infusion   Intravenous Continuous Gonfa, Taye T, MD       acetaminophen (TYLENOL) tablet 650 mg  650 mg Oral Q6H PRN Howerter, Justin B, DO       Or   acetaminophen (TYLENOL) suppository 650 mg  650 mg Rectal Q6H PRN Howerter, Justin B, DO       carbidopa-levodopa (SINEMET IR) 25-100 MG per tablet immediate release 1 tablet  1 tablet Oral TID Gonfa, Taye T, MD   1 tablet at 03/28/23 0807   fentaNYL (SUBLIMAZE) injection 25 mcg  25 mcg Intravenous Q2H PRN Howerter, Justin B, DO       melatonin tablet 3 mg  3 mg Oral QHS PRN Howerter, Justin B, DO       naloxone (NARCAN) injection 0.4 mg  0.4 mg Intravenous PRN Howerter, Justin B, DO         ondansetron (ZOFRAN) injection 4 mg  4 mg Intravenous Q6H PRN Howerter, Justin B, DO       triamcinolone (NASACORT) nasal inhaler 1 spray  1 spray Each Nare QHS Howerter, Justin B, DO        Allergies as of 03/27/2023 - Review Complete 03/27/2023  Allergen Reaction Noted   Erythromycin Shortness Of Breath and Rash    Macrobid [nitrofurantoin] Other (See Comments) 12/23/2017   Keflex [cephalexin] Nausea And Vomiting 03/27/2023   Levofloxacin Nausea Only 10/04/2021   Penicillins Rash    Sulfamethoxazole-trimethoprim Rash 10/19/2020    Family History  Problem Relation Age of Onset   Congestive Heart Failure Mother    CVA Father    Heart failure Father    Non-Hodgkin's lymphoma Sister    Alcohol abuse Sister    Liver cancer Brother    Colon cancer Brother 41   Colitis Brother    Hypertension Brother    Gout Brother    Melanoma Daughter    Parkinson's disease Cousin    Esophageal cancer Neg Hx    Rectal cancer Neg Hx    Stomach cancer Neg Hx    Colon polyps Neg Hx     Social History   Socioeconomic History   Marital status: Single    Spouse name: Not on file   Number of children: 1   Years  of education: Not on file   Highest education level: Doctorate  Occupational History   Occupation: retired  Tobacco Use   Smoking status: Never   Smokeless tobacco: Never  Vaping Use   Vaping Use: Never used  Substance and Sexual Activity   Alcohol use: Yes    Comment: rarely   Drug use: No   Sexual activity: Not Currently    Birth control/protection: Post-menopausal  Other Topics Concern   Not on file  Social History Narrative   Lives at [facility]   Right handed   Caffeine: rarely   Social Determinants of Health   Financial Resource Strain: Not on file  Food Insecurity: Not on file  Transportation Needs: Not on file  Physical Activity: Not on file  Stress: Not on file  Social Connections: Not on file  Intimate Partner Violence: Not on file    Review of Systems: ROS is O/W negative except as mentioned in HPI.  Physical Exam: Vital signs in last 24 hours: Temp:  [97.7 F (36.5 C)-98.2 F (36.8 C)] 98.2 F (36.8 C) (04/18 0441) Pulse Rate:  [61-80] 69 (04/18 0441) Resp:  [17-20] 20 (04/18 0441) BP: (129-164)/(74-113) 148/82 (04/18 0441) SpO2:  [95 %-100 %] 95 % (04/18 0441) Weight:  [63 kg-64.2 kg] 64.2 kg (04/18 0041) Last BM Date : 03/26/23 General:  Alert, Well-developed, well-nourished, pleasant and cooperative in NAD Head:  Normocephalic and atraumatic. Eyes:  Scleral icterus noted. Ears:  Normal auditory acuity. Mouth:  No deformity or lesions.   Lungs:  Clear throughout to auscultation.  No wheezes, crackles, or rhonchi.  Heart:  Regular rate and rhythm; no murmurs, clicks, rubs, or gallops. Abdomen:  Soft, non-distended. BS present.  Non-tender.   Msk:  Symmetrical without gross deformities. Pulses:  Normal pulses noted. Extremities:  Without clubbing or edema. Neurologic:  Alert and oriented x 4;  grossly normal neurologically. Skin:  Intact without significant lesions or rashes. Psych:  Alert and cooperative. Normal mood and  affect.  Intake/Output from previous day: 04/17 0701 - 04/18 0700 In: 1296.8 [I.V.:292.3; IV Piggyback:1004.5] Out: 600 [Urine:600]  Lab   Results: Recent Labs    03/27/23 1527 03/28/23 0244  WBC 7.4 7.3  HGB 14.6 14.6  HCT 42.2 41.1  PLT 407* 412*   BMET Recent Labs    03/27/23 1527 03/28/23 0244  NA 127* 132*  K 4.7 3.5  CL 90* 95*  CO2 27 26  GLUCOSE 119* 99  BUN 22 18  CREATININE 1.34* 1.19*  CALCIUM 10.1 9.5   LFT Recent Labs    03/28/23 0244  PROT 7.0  ALBUMIN 3.6  AST 195*  ALT 97*  ALKPHOS 223*  BILITOT 7.9*  BILIDIR 4.1*   PT/INR Recent Labs    03/27/23 1845 03/28/23 0244  LABPROT 12.5 13.3  INR 0.9 1.0   Studies/Results: CT ABDOMEN PELVIS W CONTRAST  Result Date: 03/27/2023 CLINICAL DATA:  Acute abdominal pain EXAM: CT ABDOMEN AND PELVIS WITH CONTRAST TECHNIQUE: Multidetector CT imaging of the abdomen and pelvis was performed using the standard protocol following bolus administration of intravenous contrast. RADIATION DOSE REDUCTION: This exam was performed according to the departmental dose-optimization program which includes automated exposure control, adjustment of the mA and/or kV according to patient size and/or use of iterative reconstruction technique. CONTRAST:  65mL OMNIPAQUE IOHEXOL 300 MG/ML  SOLN COMPARISON:  CT 07/10/2022 and older FINDINGS: Lower chest: There is some linear opacity lung bases likely scar or atelectasis. No pleural effusion. Hepatobiliary: Since the prior there is new moderate biliary ductal dilatation. The gallbladder is also dilated. No space-occupying liver lesion. Pancreas: There is severe atrophy of the pancreas which is progressive with severe ductal dilatation and a mass lesion in the pancreatic head measuring on series 2 image 21 2.4 x 1.8 cm. This measures 2.3 cm on coronal image 59 of series 5. Findings are consistent with a pancreatic neoplasm until proven otherwise. This involves the course of the common duct with  a focal stricture. There is some nodularity along the wall of the duct as well as seen coronal image 58. This abuts the portal venous confluence without narrowing or encasement. The SMA is preserved. Spleen: Normal in size without focal abnormality. Adrenals/Urinary Tract: Slight thickening of the adrenal glands. No enhancing renal mass or collecting system dilatation. The bladder is grossly preserved but is partially obscured by the significant streak artifact from the bilateral hip arthroplasties. Stomach/Bowel: Moderate colonic stool. Few colonic diverticula. No bowel obstruction. Normal appendix in the right lower quadrant stomach is underdistended. Small bowel is nondilated. Vascular/Lymphatic: Normal caliber aorta and IVC with scattered vascular calcifications. There are some small porta hepatic lymph nodes but none that are pathologically enlarged by size criteria. Reproductive: Uterus is present. No obvious adnexal mass but again portions of the pelvis are obscured by the streak artifact from the bilateral hip arthroplasties. Other: No free air or free fluid. Musculoskeletal: Curvature of the spine. Moderate degenerative changes. Bilateral hip arthroplasties. IMPRESSION: Low-attenuation mass centered along the pancreatic head with involvement of the distal common duct with the associated biliary duct ductal dilatation and pancreatic atrophy. Changes are worrisome for neoplasm either a pancreatic neoplasm or cholangiocarcinoma. Recommend further evaluation. No liver metastases. No pathologically enlarged upper abdominal nodes. Diffuse colonic stool.  Normal appendix.  Few diverticula. Portions of the pelvis are obscured by the streak artifact from the bilateral hip arthroplasties. Critical Value/emergent results were called by telephone at the time of interpretation on 03/27/2023 at 2:11 pm to provider JULIE HAVILAND , who verbally acknowledged these results. Electronically Signed   By: Ashok  Gupta M.D.   On:  03/27/2023   17:43    IMPRESSION:  *Biliary obstruction with CT scan showing a pancreatic head mass with involvement of the distal common bile duct: LFTs elevated with total bili around 8.  ? Pancreatic cancer vs cholangio. *Mild esophageal dysphagia with tiny Zenkers and cervical esophageal narrowing on esophagram 12/2022 *Parkinson's disease  PLAN: -Planning for ERCP with Dr. Perry tomorrow afternoon, 4/19.  We discussed the esophageal findings seen on previous esophagram and how those could potentially pose an issue with being able to proceed with EGD. -Trend labs/LFTs. -I have ordered a periprocedural dose of cefepime for the time of her ERCP.  I spoke to pharmacy directly and they said that she has received these before without issues.   Daxson Reffett D. Taquisha Phung  03/28/2023, 10:04 AM      

## 2023-03-28 NOTE — Progress Notes (Signed)
New Admission Note:   Arrival Method: Arrived from [facility] ED via [facility] Mental Orientation:Alert and oriented x4 Telemetry: N/A Assessment: Completed Skin:Intact IV: Lt FA Pain: 0/10 Tubes: N/A Safety Measures: Safety Fall Prevention Plan has been discussed.  Admission: Completed Orientation: Patient has been oriented to the room, unit and staff.  Family: None at bedside  Orders have been reviewed and implemented. Will continue to monitor the patient. Call light has been placed within reach and bed alarm has been activated.   Anelle Parlow [employer], RN-BC Phone number: [phone]

## 2023-03-29 ENCOUNTER — Inpatient Hospital Stay (HOSPITAL_COMMUNITY): Payer: Medicare PPO | Admitting: Certified Registered"

## 2023-03-29 ENCOUNTER — Encounter (HOSPITAL_COMMUNITY)
Admission: EM | Disposition: A | Discharge status: Home / Self Care | Payer: Self-pay | Source: Home / Self Care | Attending: Student

## 2023-03-29 ENCOUNTER — Encounter (HOSPITAL_COMMUNITY): Payer: Self-pay | Admitting: Internal Medicine

## 2023-03-29 ENCOUNTER — Inpatient Hospital Stay (HOSPITAL_COMMUNITY): Payer: Medicare PPO

## 2023-03-29 DIAGNOSIS — N189 Chronic kidney disease, unspecified: Secondary | ICD-10-CM | POA: Diagnosis not present

## 2023-03-29 DIAGNOSIS — K838 Other specified diseases of biliary tract: Secondary | ICD-10-CM | POA: Diagnosis not present

## 2023-03-29 DIAGNOSIS — K831 Obstruction of bile duct: Secondary | ICD-10-CM | POA: Diagnosis not present

## 2023-03-29 DIAGNOSIS — F418 Other specified anxiety disorders: Secondary | ICD-10-CM

## 2023-03-29 DIAGNOSIS — R17 Unspecified jaundice: Secondary | ICD-10-CM | POA: Diagnosis not present

## 2023-03-29 DIAGNOSIS — E871 Hypo-osmolality and hyponatremia: Secondary | ICD-10-CM | POA: Diagnosis not present

## 2023-03-29 DIAGNOSIS — I129 Hypertensive chronic kidney disease with stage 1 through stage 4 chronic kidney disease, or unspecified chronic kidney disease: Secondary | ICD-10-CM

## 2023-03-29 DIAGNOSIS — G20A1 Parkinson's disease without dyskinesia, without mention of fluctuations: Secondary | ICD-10-CM | POA: Diagnosis not present

## 2023-03-29 HISTORY — PX: ERCP: SHX5425

## 2023-03-29 HISTORY — PX: BILIARY STENT PLACEMENT: SHX5538

## 2023-03-29 HISTORY — PX: SPHINCTEROTOMY: SHX5279

## 2023-03-29 HISTORY — PX: BILIARY BRUSHING: SHX6843

## 2023-03-29 LAB — CBC
HCT: 35 % — ABNORMAL LOW (ref 36.0–46.0)
Hemoglobin: 12.3 g/dL (ref 12.0–15.0)
MCH: 32.3 pg (ref 26.0–34.0)
MCHC: 35.1 g/dL (ref 30.0–36.0)
MCV: 91.9 fL (ref 80.0–100.0)
Platelets: 327 10*3/uL (ref 150–400)
RBC: 3.81 MIL/uL — ABNORMAL LOW (ref 3.87–5.11)
RDW: 12.4 % (ref 11.5–15.5)
WBC: 6.2 10*3/uL (ref 4.0–10.5)
nRBC: 0 % (ref 0.0–0.2)

## 2023-03-29 LAB — COMPREHENSIVE METABOLIC PANEL
ALT: 87 U/L — ABNORMAL HIGH (ref 0–44)
AST: 176 U/L — ABNORMAL HIGH (ref 15–41)
Albumin: 2.9 g/dL — ABNORMAL LOW (ref 3.5–5.0)
Alkaline Phosphatase: 173 U/L — ABNORMAL HIGH (ref 38–126)
Anion gap: 10 (ref 5–15)
BUN: 15 mg/dL (ref 8–23)
CO2: 26 mmol/L (ref 22–32)
Calcium: 8.9 mg/dL (ref 8.9–10.3)
Chloride: 99 mmol/L (ref 98–111)
Creatinine, Ser: 1.26 mg/dL — ABNORMAL HIGH (ref 0.44–1.00)
GFR, Estimated: 44 mL/min — ABNORMAL LOW (ref >60)
Glucose, Bld: 116 mg/dL — ABNORMAL HIGH (ref 70–99)
Potassium: 3.3 mmol/L — ABNORMAL LOW (ref 3.5–5.1)
Sodium: 135 mmol/L (ref 135–145)
Total Bilirubin: 6.7 mg/dL — ABNORMAL HIGH (ref 0.3–1.2)
Total Protein: 5.6 g/dL — ABNORMAL LOW (ref 6.5–8.1)

## 2023-03-29 LAB — MAGNESIUM: Magnesium: 1.7 mg/dL (ref 1.7–2.4)

## 2023-03-29 LAB — LIPASE, BLOOD: Lipase: 41 U/L (ref 11–51)

## 2023-03-29 LAB — PHOSPHORUS: Phosphorus: 3.9 mg/dL (ref 2.5–4.6)

## 2023-03-29 SURGERY — ERCP, WITH INTERVENTION IF INDICATED
Anesthesia: General

## 2023-03-29 MED ORDER — SUGAMMADEX SODIUM 200 MG/2ML IV SOLN
INTRAVENOUS | Status: DC | PRN
  Administered 2023-03-29: 120 mg via INTRAVENOUS

## 2023-03-29 MED ORDER — INDOMETHACIN 50 MG RE SUPP
RECTAL | Status: AC
  Filled 2023-03-29: qty 1

## 2023-03-29 MED ORDER — SODIUM CHLORIDE 0.9 % IV SOLN
INTRAVENOUS | Status: DC | PRN
  Administered 2023-03-29: 30 mL

## 2023-03-29 MED ORDER — PROPOFOL 10 MG/ML IV BOLUS
INTRAVENOUS | Status: DC | PRN
  Administered 2023-03-29: 120 mg via INTRAVENOUS

## 2023-03-29 MED ORDER — PHENYLEPHRINE HCL (PRESSORS) 10 MG/ML IV SOLN
INTRAVENOUS | Status: DC | PRN
  Administered 2023-03-29: 80 ug via INTRAVENOUS

## 2023-03-29 MED ORDER — CIPROFLOXACIN IN D5W 400 MG/200ML IV SOLN
INTRAVENOUS | Status: AC
  Filled 2023-03-29: qty 200

## 2023-03-29 MED ORDER — POTASSIUM CHLORIDE IN NACL 20-0.9 MEQ/L-% IV SOLN
INTRAVENOUS | Status: DC
  Filled 2023-03-29 (×2): qty 1000

## 2023-03-29 MED ORDER — GLUCAGON HCL RDNA (DIAGNOSTIC) 1 MG IJ SOLR
INTRAMUSCULAR | Status: AC
  Filled 2023-03-29: qty 1

## 2023-03-29 MED ORDER — LACTATED RINGERS IV SOLN: INTRAVENOUS | Status: DC

## 2023-03-29 MED ORDER — INDOMETHACIN 50 MG RE SUPP: 100.0000 mg | Freq: Once | RECTAL | Status: DC

## 2023-03-29 MED ORDER — POTASSIUM CHLORIDE CRYS ER 20 MEQ PO TBCR
40.0000 meq | EXTENDED_RELEASE_TABLET | ORAL | Status: AC
  Administered 2023-03-29 (×2): 40 meq via ORAL
  Filled 2023-03-29 (×2): qty 2

## 2023-03-29 MED ORDER — LIDOCAINE 2% (20 MG/ML) 5 ML SYRINGE
INTRAMUSCULAR | Status: DC | PRN
  Administered 2023-03-29: 60 mg via INTRAVENOUS

## 2023-03-29 MED ORDER — ONDANSETRON HCL 4 MG/2ML IJ SOLN
INTRAMUSCULAR | Status: DC | PRN
  Administered 2023-03-29: 4 mg via INTRAVENOUS

## 2023-03-29 MED ORDER — ROCURONIUM BROMIDE 10 MG/ML (PF) SYRINGE
PREFILLED_SYRINGE | INTRAVENOUS | Status: DC | PRN
  Administered 2023-03-29: 50 mg via INTRAVENOUS

## 2023-03-29 MED ORDER — INDOMETHACIN 50 MG RE SUPP
RECTAL | Status: DC | PRN
  Administered 2023-03-29: 100 mg via RECTAL

## 2023-03-29 MED ORDER — INDOMETHACIN 50 MG RE SUPP
RECTAL | Status: AC
  Filled 2023-03-29: qty 2

## 2023-03-29 NOTE — Anesthesia Preprocedure Evaluation (Addendum)
Anesthesia Evaluation  Patient identified by MRN, date of birth, ID band Patient awake    Reviewed: Allergy & Precautions, H&P , NPO status , Patient's Chart, lab work & pertinent test results  Airway Mallampati: III  TM Distance: >3 FB Neck ROM: Full    Dental  (+) Dental Advisory Given, Teeth Intact   Pulmonary    Pulmonary exam normal        Cardiovascular hypertension, Pt. on medications Normal cardiovascular exam+ dysrhythmias Supra Ventricular Tachycardia      Neuro/Psych  PSYCHIATRIC DISORDERS Anxiety Depression     Parkinson's negative neurological ROS     GI/Hepatic Neg liver ROS,GERD  Medicated and Controlled,,  Endo/Other  negative endocrine ROS    Renal/GU CRFRenal disease     Musculoskeletal  (+) Arthritis , Osteoarthritis,    Abdominal   Peds  Hematology negative hematology ROS (+)   Anesthesia Other Findings   Reproductive/Obstetrics negative OB ROS                             Anesthesia Physical Anesthesia Plan  ASA: 3  Anesthesia Plan: General   Post-op Pain Management: Minimal or no pain anticipated   Induction: Intravenous  PONV Risk Score and Plan: 3 and Ondansetron, Dexamethasone and Treatment may vary due to age or medical condition  Airway Management Planned: Oral ETT  Additional Equipment: None  Intra-op Plan:   Post-operative Plan: Extubation in OR  Informed Consent: I have reviewed the patients History and Physical, chart, labs and discussed the procedure including the risks, benefits and alternatives for the proposed anesthesia with the patient or authorized representative who has indicated his/her understanding and acceptance.     Dental advisory given  Plan Discussed with: Anesthesiologist and CRNA  Anesthesia Plan Comments:         Anesthesia Quick Evaluation

## 2023-03-29 NOTE — Progress Notes (Signed)
PROGRESS NOTE  Marie Ford OZH:[id number] DOB: [birth year]   PCP: [name], MD  Patient is from: Home  DOA: 03/27/2023 LOS: 1  Chief complaints Chief Complaint  Patient presents with   Abdominal Pain     Brief Narrative / Interim history: 77 year old F with PMH of HTN, HLD, Parkinson disease, tobacco use disorder and depression who presented to [facility] ED with jaundice for about a week.  She noted that she has been experiencing abdominal cramping, intermittent nausea and vomiting for about a months.  Admitted for obstructive jaundice.  Total bili 8.6.  ALP 244.  AST 199.  ALT 124.  Lipase normal.  CT abdomen and pelvis with contrast concerning for mass centered along the pancreatic head with involvement of the distal CBD and associated biliary ductal dilation without choledocholithiasis or evidence of acute cholecystitis.   GI consulted, and recommended admission to hospitalist service and n.p.o.     Subjective: Seen and examined earlier this morning.  No major events overnight of this morning.  No complaints  Objective: Vitals:   03/28/23 1656 03/28/23 1858 03/28/23 2129 03/29/23 0854  BP: (!) 173/91 (!) 146/84 (!) 136/93 (!) 160/80  Pulse: 72 75 76 64  Resp: Temp: 98 F (36.7 C)  98.3 F (36.8 C) 97.9 F (36.6 C)  TempSrc: Oral  Oral Oral  SpO2: 100% 97% 96% 100%  Weight:      Height:        Examination:  GENERAL: No apparent distress.  Nontoxic. HEENT: MMM.  Sclera icteric. NECK: Supple.  No apparent JVD.  RESP:  No IWOB.  Fair aeration bilaterally. CVS:  RRR. Heart sounds normal.  ABD/GI/GU: BS+. Abd soft, NTND.  MSK/EXT:   No apparent deformity. Moves extremities. No edema.  SKIN: Skin jaundice in face. NEURO: Awake and alert. Oriented appropriately.  No apparent focal neuro deficit. PSYCH: Calm. Normal affect.   Procedures:  None   Microbiology summarized: None  Assessment and plan: Principal Problem:   Obstructive  jaundice Active Problems:   Essential hypertension   Hyperlipidemia   Parkinson disease   Depression   Acute hyponatremia   Allergic rhinitis   Pancreatic mass  Obstructive jaundice due to pancreatic mass concerning for malignancy: Reports normal colo over 10 years ago. Hyperbilirubinemia/elevated liver enzymes: Due to the above.  GGT elevated to 551. -GI on board. -Plan for ERCP today. -Continue monitoring  Parkinson's disease -Continue home Sinemet 4 times daily.  Essential hypertension: BP slightly elevated likely due to IV fluid.  She is also off Maxide. -P.o. hydralazine as needed  Acute hyponatremia: Likely iatrogenic from Maxzide.  Resolved. -Continue holding Maxzide  Hyperlipidemia -Hold statin in the setting of elevated liver enzymes  CKD-3A: Stable. Recent Labs    03/27/23 1527 03/28/23 0244 03/29/23 0310  BUN CREATININE 1.34* 1.19* 1.26*  -Continue IV fluid -Continue monitoring  Hypokalemia: -Monitor replenish as appropriate   Body mass index is 22.84 kg/m.          DVT prophylaxis:  SCDs Start: 03/28/23 0147  Code Status: Full code Family Communication: None at bedside Level of care: Med-Surg Status is: Inpatient Remains inpatient appropriate because: Obstructive jaundice and pancreatic mass   Final disposition: TBD Consultants:  Gastroenterology  35 minutes with more than 50% spent in reviewing records, counseling patient/family and coordinating care.   Sch Meds:  Scheduled Meds:  carbidopa-levodopa  1 tablet Oral QID   triamcinolone  1 spray Each Nare  QHS   Continuous Infusions:  sodium chloride     0.9 % NaCl with KCl 20 mEq / L 100 mL/hr at 03/29/23 1242   PRN Meds:.acetaminophen **OR** acetaminophen, ALPRAZolam, fentaNYL (SUBLIMAZE) injection, melatonin, naLOXone (NARCAN)  injection, ondansetron (ZOFRAN) IV  Antimicrobials: Anti-infectives (From admission, onward)    Start     Dose/Rate Route Frequency Ordered  Stop   03/29/23 1330  ceFEPIme (MAXIPIME) 1 g in sodium chloride 0.9 % 100 mL IVPB        1 g 200 mL/hr over 30 Minutes Intravenous  Once 03/28/23 1022 03/29/23 1349        I have personally reviewed the following labs and images: CBC: Recent Labs  Lab 03/27/23 1527 03/28/23 0244 03/29/23 0310  WBC 7.4 7.3 6.2  NEUTROABS  --  3.7  --   HGB 14.6 14.6 12.3  HCT 42.2 41.1 35.0*  MCV 93.6 91.5 91.9  PLT 407* 412* 327   BMP &GFR Recent Labs  Lab 03/27/23 1527 03/28/23 0244 03/29/23 0310  NA 127* 132* 135  K 4.7 3.5 3.3*  CL 90* 95* 99  CO2 GLUCOSE 119* 99 116*  BUN CREATININE 1.34* 1.19* 1.26*  CALCIUM 10.1 9.5 8.9  MG  --  2.0 1.7  PHOS  --   --  3.9   Estimated Creatinine Clearance: 34.4 mL/min (A) (by C-G formula based on SCr of 1.26 mg/dL (H)). Liver & Pancreas: Recent Labs  Lab 03/27/23 1527 03/28/23 0244 03/29/23 0310  AST 199* 195* 176*  ALT 124* 97* 87*  ALKPHOS 244* 223* 173*  BILITOT 8.6* 7.9* 6.7*  PROT 7.9 7.0 5.6*  ALBUMIN 4.4 3.6 2.9*   Recent Labs  Lab 03/27/23 1527 03/29/23 0310  LIPASE 17 41   No results for input(s): "AMMONIA" in the last 168 hours. Diabetic: No results for input(s): "HGBA1C" in the last 72 hours. No results for input(s): "GLUCAP" in the last 168 hours. Cardiac Enzymes: No results for input(s): "CKTOTAL", "CKMB", "CKMBINDEX", "TROPONINI" in the last 168 hours. No results for input(s): "PROBNP" in the last 8760 hours. Coagulation Profile: Recent Labs  Lab 03/27/23 1845 03/28/23 0244  INR 0.9 1.0   Thyroid Function Tests: Recent Labs    03/28/23 0244  TSH 3.074   Lipid Profile: No results for input(s): "CHOL", "HDL", "LDLCALC", "TRIG", "CHOLHDL", "LDLDIRECT" in the last 72 hours. Anemia Panel: No results for input(s): "VITAMINB12", "FOLATE", "FERRITIN", "TIBC", "IRON", "RETICCTPCT" in the last 72 hours. Urine analysis:    Component Value Date/Time   COLORURINE YELLOW 03/27/2023 1527    APPEARANCEUR CLEAR 03/27/2023 1527   LABSPEC 1.015 03/27/2023 1527   PHURINE 6.5 03/27/2023 1527   GLUCOSEU NEGATIVE 03/27/2023 1527   HGBUR NEGATIVE 03/27/2023 1527   BILIRUBINUR MODERATE (A) 03/27/2023 1527   KETONESUR NEGATIVE 03/27/2023 1527   PROTEINUR TRACE (A) 03/27/2023 1527   UROBILINOGEN 0.2 11/02/2009 0552   NITRITE NEGATIVE 03/27/2023 1527   LEUKOCYTESUR NEGATIVE 03/27/2023 1527   Sepsis Labs: Invalid input(s): "PROCALCITONIN", "LACTICIDVEN"  Microbiology: No results found for this or any previous visit (from the past 240 hour(s)).  Radiology Studies: No results found.    Gaylon Melchor T. Eleni Frank [facility]  If 7PM-7AM, please contact night-coverage [email or web address] 03/29/2023, 1:52 PM

## 2023-03-29 NOTE — Op Note (Signed)
[facility] Patient Name: Marie Ford Procedure Date : 03/29/2023 MRN: [id number] Attending MD: Wilhemina Bonito. Marina Goodell , MD, [id number] Date of Birth: 06/21/1946 CSN: [id number] Age: 77 Admit Type: Inpatient Procedure:                ERCP with brushings, sphincterotomy, and biliary                            stent placement Indications:              Common bile duct stricture Providers:                Wilhemina Bonito. Marina Goodell, MD, Adolph Pollack, RN, Priscella Mann, Technician Referring MD:              Medicines:                General Anesthesia Complications:            No immediate complications. Estimated Blood Loss:     Estimated blood loss: none. Procedure:                Pre-Anesthesia Assessment:                           - Prior to the procedure, a History and Physical                            was performed, and patient medications and                            allergies were reviewed. The patient's tolerance of                            previous anesthesia was also reviewed. The risks                            and benefits of the procedure and the sedation                            options and risks were discussed with the patient.                            All questions were answered, and informed consent                            was obtained. Prior Anticoagulants: The patient has                            taken no anticoagulant or antiplatelet agents. ASA                            Grade Assessment: II - A patient with mild systemic  disease. After reviewing the risks and benefits,                            the patient was deemed in satisfactory condition to                            undergo the procedure.                           After obtaining informed consent, the scope was                            passed under direct vision. Throughout the                            procedure, the patient's blood  pressure, pulse, and                            oxygen saturations were monitored continuously. The                            [id number]-Q190V ([id number] Olympus duodenoscope was                            introduced through the mouth, and used to inject                            contrast into and used to inject contrast into the                            bile duct. The ERCP was accomplished without                            difficulty. The patient tolerated the procedure                            well. Scope In: Scope Out: Findings:      1. The side-viewing duodenoscope was passed blindly into the esophagus.       The stomach, duodenum, and major ampulla were grossly normal. The minor       ampulla was not side.      2. Scout radiograph of the abdomen with the endoscope and position was       unremarkable      3. Initial injection of contrast yielded the distal bile duct stricture       with upstream dilation. The bile duct was fully accessed with a       hydrophilic wire. Complete injection of contrast revealed a markedly       dilated bile duct with distal common duct stricture measuring about 2 cm.      4. The stricture was brushed multiple times with endoscopic biliary       cytology brush. The specimen submitted for cytologic analysis.      5. A biliary sphincterotomy was made by cutting over hydrophilic       guidewire using the ERBE system.  6. A 10 French in diameter/5 cm in length plastic biliary stent was       placed into the common duct in good position with the external flap       visible (see image). And excellent drainage.      7. There was no injection or manipulation of the pancreatic duct. Impression:               1. 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 Recommendation:           1. Standard post ERCP care                           2. Trend liver tests                           3. If the patient is doing well overnight, can go                            home tomorrow. She will need diagnostic (if                            brushings negative)/staging endoscopic ultrasound.                            Our office will arrange. I will send a note to our                            echo endosonographer, Dr. Meridee Score.                           4. Dr. Elnoria Howard covering for [facility] this weekend                           I have discussed the above findings and plans with                            the patient and her friend. I have provided them a                            copy of this report. Procedure Code(s):        --- Professional ---                           [phone], Endoscopic retrograde  cholangiopancreatography (ERCP); with placement of                            endoscopic stent into biliary or pancreatic duct,                            including pre- and post-dilation and guide wire                            passage, when performed, including sphincterotomy,                            when performed, each stent Diagnosis Code(s):        --- Professional ---                           K83.1, Obstruction of bile duct CPT copyright 2022 American [employer] Association. All rights reserved. The codes documented in this report are preliminary and upon coder review may  be revised to meet current compliance requirements. Wilhemina Bonito. Marina Goodell, MD 03/29/2023 4:30:19 PM This report has been signed electronically. Number of Addenda: 0

## 2023-03-29 NOTE — Transfer of Care (Signed)
Immediate Anesthesia Transfer of Care Note  Patient: Marie Ford  Procedure(s) Performed: ENDOSCOPIC RETROGRADE CHOLANGIOPANCREATOGRAPHY (ERCP) SPHINCTEROTOMY BILIARY STENT PLACEMENT BILIARY BRUSHING  Patient Location: PACU and Endoscopy Unit  Anesthesia Type:General  Level of Consciousness: awake  Airway & Oxygen Therapy: Patient Spontanous Breathing  Post-op Assessment: Report given to RN and Post -op Vital signs reviewed and stable  Post vital signs: Reviewed and stable  Last Vitals:  Vitals Value Taken Time  BP    Temp    Pulse    Resp    SpO2      Last Pain:  Vitals:   03/29/23 1441  TempSrc: Temporal  PainSc: 0-No pain         Complications: No notable events documented.

## 2023-03-29 NOTE — Progress Notes (Signed)
.  Mobility Specialist Progress Note   03/29/23 1120  Mobility  Activity Ambulated independently in hallway  Level of Assistance Modified independent, requires aide device or extra time  Assistive Device Other (Comment) (IV pole)  Distance Ambulated (ft) 500 ft  Activity Response Tolerated well  Mobility Referral Yes  $Mobility charge 1 Mobility   Patient received in doorway agreeable to participate in mobility. Ambulated in hallway w/ IV pole and a steady gait. Returned to room without complaint or incident. Was left in bed with all needs met, call bell in reach.    Frederico Hamman Mobility Specialist Please contact via SecureChat or  Rehab office at [phone]

## 2023-03-29 NOTE — Interval H&P Note (Signed)
History and Physical Interval Note:  03/29/2023 2:41 PM  Marie Ford  has presented today for surgery, with the diagnosis of Biliary obstruction.  The various methods of treatment have been discussed with the patient and family. After consideration of risks, benefits and other options for treatment, the patient has consented to  Procedure(s): ENDOSCOPIC RETROGRADE CHOLANGIOPANCREATOGRAPHY (ERCP) (N/A) as a surgical intervention.  The patient's history has been reviewed, patient examined, no change in status, stable for surgery.  I have reviewed the patient's chart and labs.  Questions were answered to the patient's satisfaction.    Case reviewed with the inpatient GI team.  Laboratories and x-rays personally reviewed.  For ERCP with probable stent placement as above.  I evaluated the patient personally in the endoscopy suite admissions area, preprocedure.The nature of the procedure, as well as the risks, benefits, and alternatives were carefully and thoroughly reviewed with the patient. Ample time for discussion and questions allowed. The patient understood, was satisfied, and agreed to proceed.   Marie Ford

## 2023-03-29 NOTE — Anesthesia Procedure Notes (Signed)
Procedure Name: Intubation Date/Time: 03/29/2023 3:20 PM  Performed by: Alwyn Ren, CRNAPre-anesthesia Checklist: Patient identified, Emergency Drugs available, Suction available and Patient being monitored Patient Re-evaluated:Patient Re-evaluated prior to induction Oxygen Delivery Method: Circle system utilized Preoxygenation: Pre-oxygenation with 100% oxygen Induction Type: IV induction Ventilation: Mask ventilation without difficulty Laryngoscope Size: Miller and 2 Grade View: Grade I Tube type: Oral Tube size: 7.0 mm Number of attempts: 1 Airway Equipment and Method: Stylet and Oral airway Placement Confirmation: ETT inserted through vocal cords under direct vision, positive ETCO2 and breath sounds checked- equal and bilateral Secured at: 22 cm Tube secured with: Tape Dental Injury: Teeth and Oropharynx as per pre-operative assessment

## 2023-03-30 DIAGNOSIS — K8689 Other specified diseases of pancreas: Secondary | ICD-10-CM | POA: Diagnosis not present

## 2023-03-30 DIAGNOSIS — R17 Unspecified jaundice: Secondary | ICD-10-CM | POA: Diagnosis not present

## 2023-03-30 DIAGNOSIS — K831 Obstruction of bile duct: Secondary | ICD-10-CM | POA: Diagnosis not present

## 2023-03-30 DIAGNOSIS — G20A1 Parkinson's disease without dyskinesia, without mention of fluctuations: Secondary | ICD-10-CM | POA: Diagnosis not present

## 2023-03-30 LAB — CBC
HCT: 35.1 % — ABNORMAL LOW (ref 36.0–46.0)
Hemoglobin: 12.1 g/dL (ref 12.0–15.0)
MCH: 32.5 pg (ref 26.0–34.0)
MCHC: 34.5 g/dL (ref 30.0–36.0)
MCV: 94.4 fL (ref 80.0–100.0)
Platelets: 335 10*3/uL (ref 150–400)
RBC: 3.72 MIL/uL — ABNORMAL LOW (ref 3.87–5.11)
RDW: 12.3 % (ref 11.5–15.5)
WBC: 6.6 10*3/uL (ref 4.0–10.5)
nRBC: 0 % (ref 0.0–0.2)

## 2023-03-30 LAB — COMPREHENSIVE METABOLIC PANEL
ALT: 114 U/L — ABNORMAL HIGH (ref 0–44)
AST: 143 U/L — ABNORMAL HIGH (ref 15–41)
Albumin: 2.7 g/dL — ABNORMAL LOW (ref 3.5–5.0)
Alkaline Phosphatase: 155 U/L — ABNORMAL HIGH (ref 38–126)
Anion gap: 8 (ref 5–15)
BUN: 14 mg/dL (ref 8–23)
CO2: 23 mmol/L (ref 22–32)
Calcium: 8.6 mg/dL — ABNORMAL LOW (ref 8.9–10.3)
Chloride: 104 mmol/L (ref 98–111)
Creatinine, Ser: 0.99 mg/dL (ref 0.44–1.00)
GFR, Estimated: 58 mL/min — ABNORMAL LOW (ref >60)
Glucose, Bld: 125 mg/dL — ABNORMAL HIGH (ref 70–99)
Potassium: 3.7 mmol/L (ref 3.5–5.1)
Sodium: 135 mmol/L (ref 135–145)
Total Bilirubin: 4.5 mg/dL — ABNORMAL HIGH (ref 0.3–1.2)
Total Protein: 5.5 g/dL — ABNORMAL LOW (ref 6.5–8.1)

## 2023-03-30 LAB — PHOSPHORUS: Phosphorus: 2.3 mg/dL — ABNORMAL LOW (ref 2.5–4.6)

## 2023-03-30 LAB — MAGNESIUM: Magnesium: 1.6 mg/dL — ABNORMAL LOW (ref 1.7–2.4)

## 2023-03-30 MED ORDER — PRAVASTATIN SODIUM 80 MG PO TABS: 80.0000 mg | ORAL_TABLET | Freq: Every evening | ORAL | Status: DC

## 2023-03-30 MED ORDER — LOSARTAN POTASSIUM 25 MG PO TABS
25.0000 mg | ORAL_TABLET | Freq: Every day | ORAL | Status: DC
  Administered 2023-03-30: 25 mg via ORAL
  Filled 2023-03-30: qty 1

## 2023-03-30 MED ORDER — LOSARTAN POTASSIUM 50 MG PO TABS: 50.0000 mg | ORAL_TABLET | Freq: Every day | ORAL | 1 refills | Status: DC

## 2023-03-30 MED ORDER — MAGNESIUM SULFATE 2 GM/50ML IV SOLN
2.0000 g | Freq: Once | INTRAVENOUS | Status: AC
  Administered 2023-03-30: 2 g via INTRAVENOUS
  Filled 2023-03-30: qty 50

## 2023-03-30 MED ORDER — POTASSIUM PHOSPHATES 15 MMOLE/5ML IV SOLN
30.0000 mmol | Freq: Once | INTRAVENOUS | Status: AC
  Administered 2023-03-30: 30 mmol via INTRAVENOUS
  Filled 2023-03-30: qty 10

## 2023-03-30 NOTE — Discharge Summary (Signed)
Physician Discharge Summary  Marie Ford ZOX:[id number] DOB: Dec 31, 1945 DOA: 03/27/2023  PCP: [name], MD  Admit date: 03/27/2023 Discharge date: 03/30/2023 Admitted From: Home. Disposition: Home. Recommendations for Outpatient Follow-up:  Follow up with PCP in 1 to 2 weeks [facility] to arrange outpatient follow-up Stop Maxzide and started losartan due to hyponatremia and hypokalemia.   Reassess blood pressure and adjust antihypertensive meds as appropriate Check CMP and CBC at follow-up. Please follow up on the following pending results: Brush biopsy from ERCP  Home Health: Not indicated Equipment/Devices: Not indicated  Discharge Condition: Stable CODE STATUS: Full code  Follow-up Information     [name], MD. Schedule an appointment as soon as possible for a visit in 1 week(s).   Specialty: Family Medicine Contact information: [address] [place] [address] [phone]                 Hospital course 77 year old F with PMH of HTN, HLD, Parkinson disease, tobacco use disorder and depression who presented to [facility] with jaundice for about a week. She noted that she has been experiencing abdominal cramping, intermittent nausea and vomiting for about a months. Admitted for obstructive jaundice. Total bili 8.6. ALP 244. AST 199. ALT 124. Lipase normal. CT abdomen and pelvis with contrast concerning for mass centered along the pancreatic head with involvement of the distal CBD and associated biliary ductal dilation without choledocholithiasis or evidence of acute cholecystitis. [facility] consulted, and recommended admission to hospitalist service and n.p.o.   See individual problem list below for more.   Problems addressed during this hospitalization Principal Problem:   Obstructive jaundice Active Problems:   Essential hypertension   Hyperlipidemia   Parkinson disease   Depression   Acute hyponatremia   Allergic rhinitis    Pancreatic mass   Obstructive jaundice due to pancreatic mass concerning for malignancy: Reports normal colo over 10 years ago. Hyperbilirubinemia/elevated liver enzymes: Due to the above.  GGT elevated to 551. -S/p ERCP, ductal brushing and stent placement by GI on 4/19. -Follow-up biopsy from ductal brush -Recheck CMP at follow-up -GI to arrange outpatient follow-up -Advised holding statin   Parkinson's disease -Continue home Sinemet 4 times daily.   Essential hypertension: BP slightly elevated likely due to IV fluid.  She is also off Maxide. -Change in Maxzide to losartan -Reassess and adjust as appropriate   Acute hyponatremia/hypokalemia: Likely iatrogenic from Maxzide.  Resolved. -Change in Maxzide to losartan.   Hyperlipidemia -Advised holding statin.   CKD-3A: Stable.  Creatinine improved.  Hypomagnesemia -Received IV magnesium sulfate 2 g prior to discharge           Time spent 35 minutes  Vital signs Vitals:   03/29/23 1655 03/29/23 2200 03/30/23 0418 03/30/23 0917  BP: (!) 174/90 (!) 157/89 (!) 152/82 131/65  Pulse: 67 76 74 75  Temp:  98 F (36.7 C) 98.5 F (36.9 C) 98.4 F (36.9 C)  Resp: Height:      Weight:  65 kg    SpO2: 97% 100% 100% 99%  TempSrc:  Oral Oral Oral  BMI (Calculated):  23.14       Discharge exam  GENERAL: No apparent distress.  Nontoxic. HEENT: MMM.  Sclera icteric. NECK: Supple.  No apparent JVD.  RESP:  No IWOB.  Fair aeration bilaterally. CVS:  RRR. Heart sounds normal.  ABD/GI/GU: BS+. Abd soft, NTND.  MSK/EXT:  Moves extremities. No apparent deformity. No edema.  SKIN:  no apparent skin lesion or wound NEURO: Awake and alert. Oriented appropriately.  No apparent focal neuro deficit. PSYCH: Calm. Normal affect.   Discharge Instructions Discharge Instructions     Call MD for:  extreme fatigue   Complete by: As directed    Call MD for:  persistant dizziness or light-headedness   Complete by: As  directed    Call MD for:  persistant nausea and vomiting   Complete by: As directed    Call MD for:  severe uncontrolled pain   Complete by: As directed    Call MD for:  temperature >100.4   Complete by: As directed    Diet - low sodium heart healthy   Complete by: As directed    Discharge instructions   Complete by: As directed    It has been a pleasure taking care of you!  You were hospitalized due to jaundice (yellowish discoloration) from biliary obstruction for which you had ERCP and stent placement.  Your bilirubin has improved.  We recommend holding pravastatin until you follow-up with your PCP.  Follow-up with gastroenterology per the recommendation. Note that we have made some changes to your blood pressure medication.  Please review your new medication list and the directions on your medications before you take them.  Follow-up with your primary care doctor in 1 to 2 weeks or sooner if needed.   Take care,   Increase activity slowly   Complete by: As directed       Allergies as of 03/30/2023       Reactions   Erythromycin Shortness Of Breath, Rash   Macrobid [nitrofurantoin] Other (See Comments)   Pass out   Keflex [cephalexin] Nausea And Vomiting   Levofloxacin Nausea Only   Penicillins Rash   Did it involve swelling of the face/tongue/throat, SOB, or low BP? No Did it involve sudden or severe rash/hives, skin peeling, or any reaction on the inside of your mouth or nose? No Did you need to seek medical attention at a hospital or doctor's office? No When did it last happen? More than 10 years ago If all above answers are "NO", may proceed with cephalosporin use.   Sulfamethoxazole-trimethoprim Rash        Medication List     STOP taking these medications    triamterene-hydrochlorothiazide 37.5-25 MG tablet Commonly known as: MAXZIDE-25       TAKE these medications    ALPRAZolam 0.5 MG tablet Commonly known as: XANAX Take 0.25 mg by mouth as needed  for anxiety. Takes as needed for sleep   Auvelity 45-105 MG Tbcr Generic drug: Dextromethorphan-buPROPion ER Take 1 tablet by mouth every evening.   buPROPion 150 MG 24 hr tablet Commonly known as: WELLBUTRIN XL Take 150 mg by mouth daily.   CALCIUM CARBONATE PO Take by mouth.   carbidopa-levodopa 25-100 MG tablet Commonly known as: SINEMET IR Take 1 tablet by mouth every 4 (four) hours as needed. What changed: when to take this   ezetimibe 10 MG tablet Commonly known as: ZETIA Take 10 mg by mouth daily.   losartan 50 MG tablet Commonly known as: Cozaar Take 1 tablet (50 mg total) by mouth daily.   multivitamin with minerals Tabs tablet Take 1 tablet by mouth daily. Women's Multivitamin   pantoprazole 40 MG tablet Commonly known as: PROTONIX Take 40 mg by mouth daily.   pravastatin 80 MG tablet Commonly known as: PRAVACHOL Take 1 tablet (80 mg total) by mouth every evening. Start taking on:  Apr 13, 2023 What changed: These instructions start on Apr 13, 2023. If you are unsure what to do until then, ask your doctor or other care provider.   Systane 0.4-0.3 % Soln Generic drug: Polyethyl Glycol-Propyl Glycol Place 1 drop into both eyes 3 (three) times daily as needed (dry/irritated eyes.).   triamcinolone 55 MCG/ACT Aero nasal inhaler Commonly known as: NASACORT Place 2 sprays into the nose daily. Use at night What changed: how much to take        Consultations: Gastroenterology  Procedures/Studies: 4/19-ERCP, ductal brushing and stent placement   DG ERCP  Result Date: 03/30/2023 CLINICAL DATA:  ERCP with stent placement Acute abdominal pain Pancreatic head mass EXAM: ERCP TECHNIQUE: Multiple spot images obtained with the fluoroscopic device and submitted for interpretation post-procedure. FLUOROSCOPY: Radiation Exposure Index (as provided by the fluoroscopic device): 56 mGy Kerma COMPARISON:  CT abdomen pelvis 03/27/2023 FINDINGS: 11 intraoperative  fluoroscopic images were submitted for interpretation. The submitted images demonstrate cannulation and opacification of the intra and extrahepatic bile ducts which are moderately dilated. Final images demonstrate placement of a biliary stent with decompression of the biliary tree. IMPRESSION: Intraoperative fluoroscopic images show dilated intra or extrahepatic bile ducts with high-grade stenosis at the level of the distal CBD. Biliary tree is decompressed following placement of common bile duct stent. These images were submitted for radiologic interpretation only. Please see the procedural report for the amount of contrast and the fluoroscopy time utilized. Electronically Signed   By: Acquanetta Belling M.D.   On: 03/30/2023 06:58   CT ABDOMEN PELVIS W CONTRAST  Result Date: 03/27/2023 CLINICAL DATA:  Acute abdominal pain EXAM: CT ABDOMEN AND PELVIS WITH CONTRAST TECHNIQUE: Multidetector CT imaging of the abdomen and pelvis was performed using the standard protocol following bolus administration of intravenous contrast. RADIATION DOSE REDUCTION: This exam was performed according to the departmental dose-optimization program which includes automated exposure control, adjustment of the mA and/or kV according to patient size and/or use of iterative reconstruction technique. CONTRAST:  65mL OMNIPAQUE IOHEXOL 300 MG/ML  SOLN COMPARISON:  CT 07/10/2022 and older FINDINGS: Lower chest: There is some linear opacity lung bases likely scar or atelectasis. No pleural effusion. Hepatobiliary: Since the prior there is new moderate biliary ductal dilatation. The gallbladder is also dilated. No space-occupying liver lesion. Pancreas: There is severe atrophy of the pancreas which is progressive with severe ductal dilatation and a mass lesion in the pancreatic head measuring on series 2 image 21 2.4 x 1.8 cm. This measures 2.3 cm on coronal image 59 of series 5. Findings are consistent with a pancreatic neoplasm until proven  otherwise. This involves the course of the common duct with a focal stricture. There is some nodularity along the wall of the duct as well as seen coronal image 58. This abuts the portal venous confluence without narrowing or encasement. The SMA is preserved. Spleen: Normal in size without focal abnormality. Adrenals/Urinary Tract: Slight thickening of the adrenal glands. No enhancing renal mass or collecting system dilatation. The bladder is grossly preserved but is partially obscured by the significant streak artifact from the bilateral hip arthroplasties. Stomach/Bowel: Moderate colonic stool. Few colonic diverticula. No bowel obstruction. Normal appendix in the right lower quadrant stomach is underdistended. Small bowel is nondilated. Vascular/Lymphatic: Normal caliber aorta and IVC with scattered vascular calcifications. There are some small porta hepatic lymph nodes but none that are pathologically enlarged by size criteria. Reproductive: Uterus is present. No obvious adnexal mass but again portions of the  pelvis are obscured by the streak artifact from the bilateral hip arthroplasties. Other: No free air or free fluid. Musculoskeletal: Curvature of the spine. Moderate degenerative changes. Bilateral hip arthroplasties. IMPRESSION: Low-attenuation mass centered along the pancreatic head with involvement of the distal common duct with the associated biliary duct ductal dilatation and pancreatic atrophy. Changes are worrisome for neoplasm either a pancreatic neoplasm or cholangiocarcinoma. Recommend further evaluation. No liver metastases. No pathologically enlarged upper abdominal nodes. Diffuse colonic stool.  Normal appendix.  Few diverticula. Portions of the pelvis are obscured by the streak artifact from the bilateral hip arthroplasties. Critical Value/emergent results were called by telephone at the time of interpretation on 03/27/2023 at 2:11 pm to provider [facility] , who verbally acknowledged these  results. Electronically Signed   By: Karen Kays M.D.   On: 03/27/2023 17:43       The results of significant diagnostics from this hospitalization (including imaging, microbiology, ancillary and laboratory) are listed below for reference.     Microbiology: No results found for this or any previous visit (from the past 240 hour(s)).   Labs:  CBC: Recent Labs  Lab 03/27/23 1527 03/28/23 0244 03/29/23 0310 03/30/23 0332  WBC 7.4 7.3 6.2 6.6  NEUTROABS  --  3.7  --   --   HGB 14.6 14.6 12.3 12.1  HCT 42.2 41.1 35.0* 35.1*  MCV 93.6 91.5 91.9 94.4  PLT 407* 412* 327 335   BMP &GFR Recent Labs  Lab 03/27/23 1527 03/28/23 0244 03/29/23 0310 03/30/23 0332  NA 127* 132* 135 135  K 4.7 3.5 3.3* 3.7  CL 90* 95* 99 104  CO2 27 26 26 23   GLUCOSE 119* 99 116* 125*  BUN 22 18 15 14   CREATININE 1.34* 1.19* 1.26* 0.99  CALCIUM 10.1 9.5 8.9 8.6*  MG  --  2.0 1.7 1.6*  PHOS  --   --  3.9 2.3*   Estimated Creatinine Clearance: 43.8 mL/min (by C-G formula based on SCr of 0.99 mg/dL). Liver & Pancreas: Recent Labs  Lab 03/27/23 1527 03/28/23 0244 03/29/23 0310 03/30/23 0332  AST 199* 195* 176* 143*  ALT 124* 97* 87* 114*  ALKPHOS 244* 223* 173* 155*  BILITOT 8.6* 7.9* 6.7* 4.5*  PROT 7.9 7.0 5.6* 5.5*  ALBUMIN 4.4 3.6 2.9* 2.7*   Recent Labs  Lab 03/27/23 1527 03/29/23 0310  LIPASE 17 41   No results for input(s): "AMMONIA" in the last 168 hours. Diabetic: No results for input(s): "HGBA1C" in the last 72 hours. No results for input(s): "GLUCAP" in the last 168 hours. Cardiac Enzymes: No results for input(s): "CKTOTAL", "CKMB", "CKMBINDEX", "TROPONINI" in the last 168 hours. No results for input(s): "PROBNP" in the last 8760 hours. Coagulation Profile: Recent Labs  Lab 03/27/23 1845 03/28/23 0244  INR 0.9 1.0   Thyroid Function Tests: Recent Labs    03/28/23 0244  TSH 3.074   Lipid Profile: No results for input(s): "CHOL", "HDL", "LDLCALC", "TRIG",  "CHOLHDL", "LDLDIRECT" in the last 72 hours. Anemia Panel: No results for input(s): "VITAMINB12", "FOLATE", "FERRITIN", "TIBC", "IRON", "RETICCTPCT" in the last 72 hours. Urine analysis:    Component Value Date/Time   COLORURINE YELLOW 03/27/2023 1527   APPEARANCEUR CLEAR 03/27/2023 1527   LABSPEC 1.015 03/27/2023 1527   PHURINE 6.5 03/27/2023 1527   GLUCOSEU NEGATIVE 03/27/2023 1527   HGBUR NEGATIVE 03/27/2023 1527   BILIRUBINUR MODERATE (A) 03/27/2023 1527   KETONESUR NEGATIVE 03/27/2023 1527   PROTEINUR TRACE (A) 03/27/2023 1527  UROBILINOGEN 0.2 11/02/2009 0552   NITRITE NEGATIVE 03/27/2023 1527   LEUKOCYTESUR NEGATIVE 03/27/2023 1527   Sepsis Labs: Invalid input(s): "PROCALCITONIN", "LACTICIDVEN"   SIGNED:  Almon Hercules, MD  [facility] 03/30/2023, 2:16 PM

## 2023-03-30 NOTE — Plan of Care (Signed)

## 2023-03-30 NOTE — Anesthesia Postprocedure Evaluation (Signed)
Anesthesia Post Note  Patient: Marie Ford  Procedure(s) Performed: ENDOSCOPIC RETROGRADE CHOLANGIOPANCREATOGRAPHY (ERCP) SPHINCTEROTOMY BILIARY STENT PLACEMENT BILIARY BRUSHING     Patient location during evaluation: PACU Anesthesia Type: General Level of consciousness: awake and alert Pain management: pain level controlled Vital Signs Assessment: post-procedure vital signs reviewed and stable Respiratory status: spontaneous breathing, nonlabored ventilation and respiratory function stable Cardiovascular status: stable and blood pressure returned to baseline Anesthetic complications: no   No notable events documented.  Last Vitals:  Vitals:   03/30/23 0418 03/30/23 0917  BP: (!) 152/82 131/65  Pulse: 74 75  Resp: 18 18  Temp: 36.9 C 36.9 C  SpO2: 100% 99%    Last Pain:  Vitals:   03/30/23 0923  TempSrc:   PainSc: 0-No pain                 Beryle Lathe

## 2023-03-30 NOTE — Progress Notes (Signed)
Subjective: Feeling well.  No issues after the ERCP with stent.  Her urine is lighter in color.  Objective: Vital signs in last 24 hours: Temp:  [97 F (36.1 C)-98.5 F (36.9 C)] 98.5 F (36.9 C) (04/20 0418) Pulse Rate:  [67-76] 74 (04/20 0418) Resp:  [13-24] 18 (04/20 0418) BP: (146-174)/(75-90) 152/82 (04/20 0418) SpO2:  [97 %-100 %] 100 % (04/20 0418) Weight:  [65 kg] 65 kg (04/19 2200) Last BM Date : 03/26/23  Intake/Output from previous day: 04/19 0701 - 04/20 0700 In: 796.7 [I.V.:796.7] Out: -  Intake/Output this shift: No intake/output data recorded.  General appearance: alert and no distress GI: soft, non-tender; bowel sounds normal; no masses,  no organomegaly  Lab Results: Recent Labs    03/28/23 0244 03/29/23 0310 03/30/23 0332  WBC 7.3 6.2 6.6  HGB 14.6 12.3 12.1  HCT 41.1 35.0* 35.1*  PLT 412* 327 335   BMET Recent Labs    03/28/23 0244 03/29/23 0310 03/30/23 0332  NA 132* 135 135  K 3.5 3.3* 3.7  CL 95* 99 104  CO2 GLUCOSE 99 116* 125*  BUN CREATININE 1.19* 1.26* 0.99  CALCIUM 9.5 8.9 8.6*   LFT Recent Labs    03/28/23 0244 03/29/23 0310 03/30/23 0332  PROT 7.0   < > 5.5*  ALBUMIN 3.6   < > 2.7*  AST 195*   < > 143*  ALT 97*   < > 114*  ALKPHOS 223*   < > 155*  BILITOT 7.9*   < > 4.5*  BILIDIR 4.1*  --   --    < > = values in this interval not displayed.   PT/INR Recent Labs    03/27/23 1845 03/28/23 0244  LABPROT 12.5 13.3  INR 0.9 1.0   Hepatitis Panel No results for input(s): "HEPBSAG", "HCVAB", "HEPAIGM", "HEPBIGM" in the last 72 hours. C-Diff No results for input(s): "CDIFFTOX" in the last 72 hours. Fecal Lactopherrin No results for input(s): "FECLLACTOFRN" in the last 72 hours.  Studies/Results: DG ERCP  Result Date: 03/30/2023 CLINICAL DATA:  ERCP with stent placement Acute abdominal pain Pancreatic head mass EXAM: ERCP TECHNIQUE: Multiple spot images obtained with the fluoroscopic device  and submitted for interpretation post-procedure. FLUOROSCOPY: Radiation Exposure Index (as provided by the fluoroscopic device): 56 mGy Kerma COMPARISON:  CT abdomen pelvis 03/27/2023 FINDINGS: 11 intraoperative fluoroscopic images were submitted for interpretation. The submitted images demonstrate cannulation and opacification of the intra and extrahepatic bile ducts which are moderately dilated. Final images demonstrate placement of a biliary stent with decompression of the biliary tree. IMPRESSION: Intraoperative fluoroscopic images show dilated intra or extrahepatic bile ducts with high-grade stenosis at the level of the distal CBD. Biliary tree is decompressed following placement of common bile duct stent. These images were submitted for radiologic interpretation only. Please see the procedural report for the amount of contrast and the fluoroscopy time utilized. Electronically Signed   By: Acquanetta Belling M.D.   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   She is well post procedure.  Brushings were obtained.  Plan: 1) Okay to D/C home barring any other medical issues. 2) Follow up with [facility].  LOS: 2 days   Marie Ford D 03/30/2023, 9:06 AM

## 2023-04-01 ENCOUNTER — Telehealth: Payer: Self-pay | Admitting: Internal Medicine

## 2023-04-01 DIAGNOSIS — R7989 Other specified abnormal findings of blood chemistry: Secondary | ICD-10-CM

## 2023-04-01 NOTE — Telephone Encounter (Signed)
Patient called requesting a call back regarding message below.

## 2023-04-01 NOTE — Telephone Encounter (Signed)
Patient calling back wanting to know the status of her EUS appt, states there is some urgency regarding the appt. Let pt know I would send the message to Dr. Meridee Score and his nurse so that they can let her know the status.

## 2023-04-01 NOTE — Telephone Encounter (Signed)
Patient is calling states Dr Marina Goodell advised her to schedule a procedure with Dr Meridee Score, also is having some troubles with itching she would like to speak with someone about. Please advise

## 2023-04-01 NOTE — Telephone Encounter (Signed)
Spoke with pt and she is aware of recommendations. She knows she will hear back regarding the EUS. She will come Thursday prior to noon for blood work. Order in epic.

## 2023-04-01 NOTE — Telephone Encounter (Addendum)
Marie Ford and [place], I have already spoken with Dr. Marina Goodell about this patient.  If her cytology brushings returned positive, then she will not need EUS. Once those have returned she can be scheduled if they are negative. I believe I have availability next Monday (my last case) so please put in a 45-minute EUS so that there is 15 minutes of time to clean the room, if her cytology brushings are negative. Thanks. GM

## 2023-04-01 NOTE — Telephone Encounter (Signed)
Una, 1.  GM is aware of this patient 2.  Her itching will improve, as her biliary drainage improves.  During the day she could use a gentle moisturizer.  At night, she could take 25 mg of Benadryl at bedtime 3.  Have her come in this Thursday before noon for LFTs. Thanks, Dr. Marina Goodell

## 2023-04-01 NOTE — Telephone Encounter (Signed)
Dr. Meridee Score Marie Ford wanted this pt to have an EUS with you. ERCP was done on 4/19 and it is on the procedure report.  Marie Ford pt states she is having intense itching all over, reports she was jaundiced but that is much better and her skin is "rosy" in color. She is wanting to know what she can take for the itching. Please advise.

## 2023-04-02 ENCOUNTER — Encounter (HOSPITAL_COMMUNITY): Payer: Self-pay | Admitting: Internal Medicine

## 2023-04-02 NOTE — Telephone Encounter (Signed)
Inbound call from patient following up on note below.

## 2023-04-02 NOTE — Telephone Encounter (Addendum)
Patient is calling to schedule procedure but I am unable to do 45 min slot. Please advise

## 2023-04-03 ENCOUNTER — Other Ambulatory Visit: Payer: Self-pay

## 2023-04-03 DIAGNOSIS — K831 Obstruction of bile duct: Secondary | ICD-10-CM

## 2023-04-03 LAB — CYTOLOGY - NON PAP

## 2023-04-03 NOTE — Telephone Encounter (Signed)
The pt has been scheduled for 04/08/23 at 2 pm for EUS with GM at [facility]

## 2023-04-05 ENCOUNTER — Other Ambulatory Visit: Payer: Self-pay

## 2023-04-05 ENCOUNTER — Other Ambulatory Visit (INDEPENDENT_AMBULATORY_CARE_PROVIDER_SITE_OTHER): Payer: Medicare PPO

## 2023-04-05 ENCOUNTER — Encounter (HOSPITAL_COMMUNITY): Payer: Self-pay | Admitting: Gastroenterology

## 2023-04-05 DIAGNOSIS — R7989 Other specified abnormal findings of blood chemistry: Secondary | ICD-10-CM

## 2023-04-05 LAB — HEPATIC FUNCTION PANEL
ALT: 26 U/L (ref 0–35)
AST: 32 U/L (ref 0–37)
Albumin: 3.7 g/dL (ref 3.5–5.2)
Alkaline Phosphatase: 149 U/L — ABNORMAL HIGH (ref 39–117)
Bilirubin, Direct: 0.8 mg/dL — ABNORMAL HIGH (ref 0.0–0.3)
Total Bilirubin: 1.7 mg/dL — ABNORMAL HIGH (ref 0.2–1.2)
Total Protein: 6.7 g/dL (ref 6.0–8.3)

## 2023-04-05 NOTE — Progress Notes (Signed)
Attempted to obtain medical history via telephone, unable to reach at this time. HIPAA compliant voicemail message left requesting return call to pre surgical testing department. 

## 2023-04-08 ENCOUNTER — Ambulatory Visit (HOSPITAL_COMMUNITY)
Admission: RE | Admit: 2023-04-08 | Discharge: 2023-04-08 | Disposition: A | Discharge status: Home / Self Care | Payer: Medicare PPO | Attending: Gastroenterology | Admitting: Gastroenterology

## 2023-04-08 ENCOUNTER — Encounter (HOSPITAL_COMMUNITY)
Admission: RE | Disposition: A | Discharge status: Home / Self Care | Payer: Self-pay | Source: Home / Self Care | Attending: Gastroenterology

## 2023-04-08 ENCOUNTER — Other Ambulatory Visit: Payer: Self-pay

## 2023-04-08 ENCOUNTER — Encounter (HOSPITAL_COMMUNITY): Payer: Self-pay | Admitting: Gastroenterology

## 2023-04-08 ENCOUNTER — Ambulatory Visit (HOSPITAL_COMMUNITY): Payer: Medicare PPO | Admitting: Certified Registered Nurse Anesthetist

## 2023-04-08 ENCOUNTER — Telehealth: Payer: Self-pay | Admitting: Neurology

## 2023-04-08 ENCOUNTER — Ambulatory Visit (HOSPITAL_BASED_OUTPATIENT_CLINIC_OR_DEPARTMENT_OTHER): Payer: Medicare PPO | Admitting: Certified Registered Nurse Anesthetist

## 2023-04-08 DIAGNOSIS — K2970 Gastritis, unspecified, without bleeding: Secondary | ICD-10-CM | POA: Diagnosis not present

## 2023-04-08 DIAGNOSIS — K219 Gastro-esophageal reflux disease without esophagitis: Secondary | ICD-10-CM | POA: Diagnosis not present

## 2023-04-08 DIAGNOSIS — K449 Diaphragmatic hernia without obstruction or gangrene: Secondary | ICD-10-CM

## 2023-04-08 DIAGNOSIS — I10 Essential (primary) hypertension: Secondary | ICD-10-CM

## 2023-04-08 DIAGNOSIS — C250 Malignant neoplasm of head of pancreas: Secondary | ICD-10-CM | POA: Insufficient documentation

## 2023-04-08 DIAGNOSIS — R590 Localized enlarged lymph nodes: Secondary | ICD-10-CM | POA: Insufficient documentation

## 2023-04-08 DIAGNOSIS — Z79899 Other long term (current) drug therapy: Secondary | ICD-10-CM | POA: Diagnosis not present

## 2023-04-08 DIAGNOSIS — K2289 Other specified disease of esophagus: Secondary | ICD-10-CM

## 2023-04-08 DIAGNOSIS — K8689 Other specified diseases of pancreas: Secondary | ICD-10-CM | POA: Diagnosis not present

## 2023-04-08 DIAGNOSIS — K831 Obstruction of bile duct: Secondary | ICD-10-CM

## 2023-04-08 DIAGNOSIS — K838 Other specified diseases of biliary tract: Secondary | ICD-10-CM

## 2023-04-08 HISTORY — PX: FINE NEEDLE ASPIRATION: SHX5430

## 2023-04-08 HISTORY — PX: ESOPHAGOGASTRODUODENOSCOPY (EGD) WITH PROPOFOL: SHX5813

## 2023-04-08 HISTORY — PX: EUS: SHX5427

## 2023-04-08 HISTORY — PX: BIOPSY: SHX5522

## 2023-04-08 SURGERY — ESOPHAGOGASTRODUODENOSCOPY (EGD) WITH PROPOFOL
Anesthesia: Monitor Anesthesia Care

## 2023-04-08 MED ORDER — PROPOFOL 10 MG/ML IV BOLUS
INTRAVENOUS | Status: DC | PRN
  Administered 2023-04-08 (×2): 20 mg via INTRAVENOUS

## 2023-04-08 MED ORDER — LIDOCAINE 2% (20 MG/ML) 5 ML SYRINGE
INTRAMUSCULAR | Status: DC | PRN
  Administered 2023-04-08: 40 mg via INTRAVENOUS

## 2023-04-08 MED ORDER — SODIUM CHLORIDE 0.9 % IV SOLN: INTRAVENOUS | Status: DC

## 2023-04-08 MED ORDER — PROPOFOL 500 MG/50ML IV EMUL
INTRAVENOUS | Status: AC
  Filled 2023-04-08: qty 50

## 2023-04-08 MED ORDER — LACTATED RINGERS IV SOLN: INTRAVENOUS | Status: DC

## 2023-04-08 MED ORDER — PROPOFOL 500 MG/50ML IV EMUL
INTRAVENOUS | Status: DC | PRN
  Administered 2023-04-08: 175 ug/kg/min via INTRAVENOUS

## 2023-04-08 NOTE — Discharge Instructions (Signed)
YOU HAD AN ENDOSCOPIC PROCEDURE TODAY: Refer to the procedure report and other information in the discharge instructions given to you for any specific questions about what was found during the examination. If this information does not answer your questions, please call [place] office at [phone] to clarify.  ° °YOU SHOULD EXPECT: Some feelings of bloating in the abdomen. Passage of more gas than usual. Walking can help get rid of the air that was put into your GI tract during the procedure and reduce the bloating. If you had a lower endoscopy (such as a colonoscopy or flexible sigmoidoscopy) you may notice spotting of blood in your stool or on the toilet paper. Some abdominal soreness may be present for a day or two, also. ° °DIET: Your first meal following the procedure should be a light meal and then it is ok to progress to your normal diet. A half-sandwich or bowl of soup is an example of a good first meal. Heavy or fried foods are harder to digest and may make you feel nauseous or bloated. Drink plenty of fluids but you should avoid alcoholic beverages for 24 hours. If you had a esophageal dilation, please see attached instructions for diet.   ° °ACTIVITY: Your care partner should take you home directly after the procedure. You should plan to take it easy, moving slowly for the rest of the day. You can resume normal activity the day after the procedure however YOU SHOULD NOT DRIVE, use power tools, machinery or perform tasks that involve climbing or major physical exertion for 24 hours (because of the sedation medicines used during the test).  ° °SYMPTOMS TO REPORT IMMEDIATELY: °A gastroenterologist can be reached at any hour. Please call [phone]  for any of the following symptoms:  °Following lower endoscopy (colonoscopy, flexible sigmoidoscopy) °Excessive amounts of blood in the stool  °Significant tenderness, worsening of abdominal pains  °Swelling of the abdomen that is new, acute  °Fever of 100° or  higher  °Following upper endoscopy (EGD, EUS, ERCP, esophageal dilation) °Vomiting of blood or coffee ground material  °New, significant abdominal pain  °New, significant chest pain or pain under the shoulder blades  °Painful or persistently difficult swallowing  °New shortness of breath  °Black, tarry-looking or red, bloody stools ° °FOLLOW UP:  °If any biopsies were taken you will be contacted by phone or by letter within the next 1-3 weeks. Call [phone]  if you have not heard about the biopsies in 3 weeks.  °Please also call with any specific questions about appointments or follow up tests. ° °

## 2023-04-08 NOTE — H&P (Signed)
GASTROENTEROLOGY PROCEDURE H&P NOTE   Primary Care Physician: [name], MD  HPI: Marie Ford is a 77 y.o. female who presents for EGD/EUS to evaluate pancreatic mass with recent biliary obstruction, rule out malignancy.  Past Medical History:  Diagnosis Date   Chronic kidney disease    stage 3    Chronic sphenoidal sinusitis    Degenerative arthritis of hip    requirde surgery 2009   Depression 2010   Diplopia 2011   in left lateral gaze    Diplopia    " my eyes dont track together, i have corrective prisms in my eye glasses"    Diverticulosis 2006   noted in a colonoscopy   Esophageal reflux 2013   Hypertension    IGT (impaired glucose tolerance) 2011   denies hx of diabetes    Insomnia 2009   Mixed hyperlipidemia 2000   Osteoarthritis    Osteopenia 2005   Other and unspecified hyperlipidemia 2000   Other specified disease of sebaceous glands    Palpitations 2009   secondary to PSVT   Parkinson disease    PNA (pneumonia) 2014   Psoriasis 2004   PSVT (paroxysmal supraventricular tachycardia) 2009   "report it has been some time since i had that "    Situational stress    Venous insufficiency since 2017   Past Surgical History:  Procedure Laterality Date   BILIARY BRUSHING  03/29/2023   Procedure: BILIARY BRUSHING;  Surgeon: Hilarie Fredrickson, MD;  Location: [facility] ENDOSCOPY;  Service: Gastroenterology;;   BILIARY STENT PLACEMENT  03/29/2023   Procedure: BILIARY STENT PLACEMENT;  Surgeon: Hilarie Fredrickson, MD;  Location: [facility] ENDOSCOPY;  Service: Gastroenterology;;   CATARACT EXTRACTION W/ INTRAOCULAR LENS  IMPLANT, BILATERAL     COLONOSCOPY  01/30/2010   Jarold Motto   ERCP N/A 03/29/2023   Procedure: ENDOSCOPIC RETROGRADE CHOLANGIOPANCREATOGRAPHY (ERCP);  Surgeon: Hilarie Fredrickson, MD;  Location: [facility] ENDOSCOPY;  Service: Gastroenterology;  Laterality: N/A;   ETHMOIDECTOMY Left 11/17/2020   Procedure: LEFT SIDED TOTAL ETHMOIDECTOMY;  Surgeon: Drema Halon, MD;   Location: [facility];  Service: ENT;  Laterality: Left;   EYE SURGERY     HEMORRHOID SURGERY     JOINT REPLACEMENT     LASIK     left medial and superior rectus muscle recesion for diplopia correction     SINUS ENDO WITH FUSION Left 11/17/2020   Procedure: SINUS ENDOSCOPY WITH FUSION NAVIGATION;  Surgeon: Drema Halon, MD;  Location: [facility];  Service: ENT;  Laterality: Left;   SPHENOIDECTOMY Left 11/17/2020   Procedure: LEFT SIDED SPHENOIDECTOMY WITH TISSUE REMOVAL;  Surgeon: Drema Halon, MD;  Location: [facility];  Service: ENT;  Laterality: Left;   SPHINCTEROTOMY  03/29/2023   Procedure: SPHINCTEROTOMY;  Surgeon: Hilarie Fredrickson, MD;  Location: [facility] ENDOSCOPY;  Service: Gastroenterology;;   surgery of right eye rectus muscles for diplpia correction   2019   TOTAL HIP ARTHROPLASTY Right 2010   TOTAL HIP ARTHROPLASTY Left 08/19/2019   Procedure: TOTAL HIP ARTHROPLASTY ANTERIOR APPROACH;  Surgeon: Ollen Gross, MD;  Location: [facility] ORS;  Service: Orthopedics;  Laterality: Left;    TURBINATE REDUCTION Left 11/17/2020   Procedure: LEFT SIDED TURBINATE REDUCTION;  Surgeon: Drema Halon, MD;  Location: [facility];  Service: ENT;  Laterality: Left;   URETHRAL SLING     midurethral sling with TVT Exact and Cystoscopy   Current Facility-Administered Medications  Medication  Dose Route Frequency Provider Last Rate Last Admin   0.9 %  sodium chloride infusion   Intravenous Continuous Mansouraty, Netty Starring., MD       lactated ringers infusion   Intravenous Continuous Mansouraty, Netty Starring., MD 10 mL/hr at 04/08/23 1302 Restarted at 04/08/23 1311    Current Facility-Administered Medications:    0.9 %  sodium chloride infusion, , Intravenous, Continuous, Mansouraty, Netty Starring., MD   lactated ringers infusion, , Intravenous, Continuous, Mansouraty, Netty Starring., MD, Last Rate: 10 mL/hr at 04/08/23 1302, Restarted  at 04/08/23 1311 Allergies  Allergen Reactions   Erythromycin Shortness Of Breath and Rash   Macrobid [Nitrofurantoin] Other (See Comments)    Pass out   Keflex [Cephalexin] Nausea And Vomiting   Levofloxacin Nausea Only   Penicillins Rash    Did it involve swelling of the face/tongue/throat, SOB, or low BP? No Did it involve sudden or severe rash/hives, skin peeling, or any reaction on the inside of your mouth or nose? No Did you need to seek medical attention at a hospital or doctor's office? No When did it last happen? More than 10 years ago If all above answers are "NO", may proceed with cephalosporin use.    Sulfamethoxazole-Trimethoprim Rash   Family History  Problem Relation Age of Onset   Congestive Heart Failure Mother    CVA Father    Heart failure Father    Non-Hodgkin's lymphoma Sister    Alcohol abuse Sister    Liver cancer Brother    Colon cancer Brother 45   Colitis Brother    Hypertension Brother    Gout Brother    Melanoma Daughter    Parkinson's disease Cousin    Esophageal cancer Neg Hx    Rectal cancer Neg Hx    Stomach cancer Neg Hx    Colon polyps Neg Hx    Social History   Socioeconomic History   Marital status: Single    Spouse name: Not on file   Number of children: 1   Years of education: Not on file   Highest education level: Doctorate  Occupational History   Occupation: retired  Tobacco Use   Smoking status: Never   Smokeless tobacco: Never  Vaping Use   Vaping Use: Never used  Substance and Sexual Activity   Alcohol use: Yes    Comment: rarely   Drug use: No   Sexual activity: Not Currently    Birth control/protection: Post-menopausal  Other Topics Concern   Not on file  Social History Narrative   Lives at [facility]   Right handed   Caffeine: rarely   Social Determinants of Health   Financial Resource Strain: Not on file  Food Insecurity: No Food Insecurity (03/29/2023)   Hunger Vital Sign    Worried  About Running Out of Food in the Last Year: Never true    Ran Out of Food in the Last Year: Never true  Transportation Needs: No Transportation Needs (03/29/2023)   PRAPARE - Administrator, Civil Service (Medical): No    Lack of Transportation (Non-Medical): No  Physical Activity: Not on file  Stress: Not on file  Social Connections: Not on file  Intimate Partner Violence: Not At Risk (03/29/2023)   Humiliation, Afraid, Rape, and Kick questionnaire    Fear of Current or Ex-Partner: No    Emotionally Abused: No    Physically Abused: No    Sexually Abused: No    Physical Exam: Today's Vitals  04/08/23 1248  BP: (!) 162/76  Pulse: 77  Resp: 17  Temp: 97.8 F (36.6 C)  TempSrc: Tympanic  SpO2: 100%  Weight: 64.4 kg  Height: 5\' 6"  (1.676 m)  PainSc: 0-No pain   Body mass index is 22.92 kg/m. GEN: NAD EYE: Sclerae anicteric ENT: MMM CV: Non-tachycardic GI: Soft, NT/ND NEURO:  Alert & Oriented x 3  Lab Results: No results for input(s): "WBC", "HGB", "HCT", "PLT" in the last 72 hours. BMET No results for input(s): "NA", "K", "CL", "CO2", "GLUCOSE", "BUN", "CREATININE", "CALCIUM" in the last 72 hours. LFT No results for input(s): "PROT", "ALBUMIN", "AST", "ALT", "ALKPHOS", "BILITOT", "BILIDIR", "IBILI" in the last 72 hours. PT/INR No results for input(s): "LABPROT", "INR" in the last 72 hours.   Impression / Plan: This is a 77 y.o.female who presents for EGD/EUS to evaluate pancreatic mass with recent biliary obstruction, rule out malignancy.  The risks of an EUS including intestinal perforation, bleeding, infection, aspiration, and medication effects were discussed as was the possibility it may not give a definitive diagnosis if a biopsy is performed.  When a biopsy of the pancreas is done as part of the EUS, there is an additional risk of pancreatitis at the rate of about 1-2%.  It was explained that procedure related pancreatitis is typically mild, although  it can be severe and even life threatening, which is why we do not perform random pancreatic biopsies and only biopsy a lesion/area we feel is concerning enough to warrant the risk.   The risks and benefits of endoscopic evaluation/treatment were discussed with the patient and/or family; these include but are not limited to the risk of perforation, infection, bleeding, missed lesions, lack of diagnosis, severe illness requiring hospitalization, as well as anesthesia and sedation related illnesses.  The patient's history has been reviewed, patient examined, no change in status, and deemed stable for procedure.  The patient and/or family is agreeable to proceed.    Corliss Parish, MD [facility] Office # [phone]

## 2023-04-08 NOTE — Anesthesia Procedure Notes (Signed)
Procedure Name: MAC Date/Time: 04/08/2023 1:33 PM  Performed by: Vanessa [place], CRNAPre-anesthesia Checklist: Patient identified, Emergency Drugs available, Suction available and Patient being monitored Patient Re-evaluated:Patient Re-evaluated prior to induction Oxygen Delivery Method: Simple face mask

## 2023-04-08 NOTE — Anesthesia Preprocedure Evaluation (Signed)
Anesthesia Evaluation  Patient identified by MRN, date of birth, ID band Patient awake    Reviewed: Allergy & Precautions, NPO status , Patient's Chart, lab work & pertinent test results  Airway Mallampati: II  TM Distance: >3 FB Neck ROM: Full    Dental  (+) Teeth Intact, Dental Advisory Given   Pulmonary    breath sounds clear to auscultation       Cardiovascular hypertension, Pt. on medications  Rhythm:Regular Rate:Normal     Neuro/Psych  PSYCHIATRIC DISORDERS Anxiety Depression       GI/Hepatic Neg liver ROS,GERD  Medicated and Controlled,,  Endo/Other  negative endocrine ROS    Renal/GU Renal disease     Musculoskeletal  (+) Arthritis ,    Abdominal   Peds  Hematology negative hematology ROS (+)   Anesthesia Other Findings   Reproductive/Obstetrics                              Anesthesia Physical Anesthesia Plan  ASA: 2  Anesthesia Plan: MAC   Post-op Pain Management: Minimal or no pain anticipated   Induction: Intravenous  PONV Risk Score and Plan: 0 and Propofol infusion  Airway Management Planned: Natural Airway  Additional Equipment: None  Intra-op Plan:   Post-operative Plan:   Informed Consent: I have reviewed the patients History and Physical, chart, labs and discussed the procedure including the risks, benefits and alternatives for the proposed anesthesia with the patient or authorized representative who has indicated his/her understanding and acceptance.       Plan Discussed with: CRNA  Anesthesia Plan Comments:          Anesthesia Quick Evaluation

## 2023-04-08 NOTE — Transfer of Care (Signed)
Immediate Anesthesia Transfer of Care Note  Patient: Marie Ford  Procedure(s) Performed: UPPER ENDOSCOPIC ULTRASOUND (EUS) LINEAR ESOPHAGOGASTRODUODENOSCOPY (EGD) WITH PROPOFOL FINE NEEDLE ASPIRATION (FNA) LINEAR BIOPSY  Patient Location: Endoscopy Unit  Anesthesia Type:MAC  Level of Consciousness: patient cooperative  Airway & Oxygen Therapy: Patient Spontanous Breathing and Patient connected to face mask  Post-op Assessment: Report given to RN and Post -op Vital signs reviewed and stable  Post vital signs: Reviewed and stable  Last Vitals:  Vitals Value Taken Time  BP    Temp    Pulse 76 04/08/23 1418  Resp 19 04/08/23 1418  SpO2 100 % 04/08/23 1418  Vitals shown include unvalidated device data.  Last Pain:  Vitals:   04/08/23 1248  TempSrc: Tympanic  PainSc: 0-No pain         Complications: No notable events documented.

## 2023-04-08 NOTE — Anesthesia Postprocedure Evaluation (Signed)
Anesthesia Post Note  Patient: Marie Ford  Procedure(s) Performed: UPPER ENDOSCOPIC ULTRASOUND (EUS) LINEAR ESOPHAGOGASTRODUODENOSCOPY (EGD) WITH PROPOFOL FINE NEEDLE ASPIRATION (FNA) LINEAR BIOPSY     Patient location during evaluation: PACU Anesthesia Type: MAC Level of consciousness: awake and alert Pain management: pain level controlled Vital Signs Assessment: post-procedure vital signs reviewed and stable Respiratory status: spontaneous breathing, nonlabored ventilation, respiratory function stable and patient connected to nasal cannula oxygen Cardiovascular status: stable and blood pressure returned to baseline Postop Assessment: no apparent nausea or vomiting Anesthetic complications: no  No notable events documented.  Last Vitals:  Vitals:   04/08/23 1440 04/08/23 1448  BP: (!) 190/89 (!) 187/92  Pulse: 72 74  Resp: 20 20  Temp:    SpO2: 98% 99%    Last Pain:  Vitals:   04/08/23 1448  TempSrc:   PainSc: 0-No pain                 Shelton Silvas

## 2023-04-08 NOTE — Telephone Encounter (Signed)
Spoke to [place] who is Marie Ford's friend and she wanted me to message you letting you know the patient may possibly have cancer and that she wasn't sure what was going on. They didn't know if they wanted to cancel the appt as of yet. I offered a VV so it's possible. Not sure if the appt is needed or if it canbe pushed out further.

## 2023-04-08 NOTE — Op Note (Signed)
[facility] Patient Name: Marie Ford Procedure Date: 04/08/2023 MRN: [id number] Attending MD: Corliss Parish , MD, [id number] Date of Birth: [birth year] CSN: [id number] Age: 77 Admit Type: Outpatient Procedure:                Upper EUS Indications:              Common bile duct dilation (acquired) seen on CT                            scan, Dilated pancreatic duct on CT scan, Suspected                            mass in pancreas on CT scan Providers:                Corliss Parish, MD, Zoe Lan, RN, Harrington Challenger, Technician, Nadene Rubins Referring MD:             Wilhemina Bonito. Marina Goodell, MD, [name], Tressia Danas                            MD, MD Medicines:                Monitored Anesthesia Care Complications:            No immediate complications. Estimated Blood Loss:     Estimated blood loss was minimal. Procedure:                Pre-Anesthesia Assessment:                           - Prior to the procedure, a History and Physical                            was performed, and patient medications and                            allergies were reviewed. The patient's tolerance of                            previous anesthesia was also reviewed. The risks                            and benefits of the procedure and the sedation                            options and risks were discussed with the patient.                            All questions were answered, and informed consent                            was obtained. Prior Anticoagulants: The patient has  taken no anticoagulant or antiplatelet agents. ASA                            Grade Assessment: III - A patient with severe                            systemic disease. After reviewing the risks and                            benefits, the patient was deemed in satisfactory                            condition to undergo the procedure.                            After obtaining informed consent, the endoscope was                            passed under direct vision. Throughout the                            procedure, the patient's blood pressure, pulse, and                            oxygen saturations were monitored continuously. The                            [id number]-H190 ([id number] Olympus endoscope was introduced                            through the mouth, and advanced to the second part                            of duodenum. The [id number]-[id number] ([id number]) Olympus                            duodenoscope was introduced through the mouth, and                            advanced to the area of papilla. The [id number]                            ([id number]) Olympus linear ultrasound scope was                            introduced through the mouth, and advanced to the                            duodenum for ultrasound examination from the                            stomach and duodenum. The upper EUS was  accomplished without difficulty. The patient                            tolerated the procedure. Scope In: Scope Out: Findings:      ENDOSCOPIC FINDING: :      No gross lesions were noted in the entire esophagus.      The Z-line was irregular and was found 39 cm from the incisors.      A 1 cm hiatal hernia was present.      Patchy moderate inflammation characterized by erosions and erythema was       found in the entire examined stomach. Biopsies were taken with a cold       forceps for histology and Helicobacter pylori testing.      A previously placed plastic biliary stent was seen at the major papilla.      No gross lesions were noted in the duodenal bulb, in the first portion       of the duodenum and in the second portion of the duodenum.      ENDOSONOGRAPHIC FINDING: :      An irregular mass was identified in the pancreatic head/genu of the       pancreas. The mass was hypoechoic. The mass measured 30 mm by 23 mm  in       maximal cross-sectional diameter. The outer margins were irregular. An       intact interface was seen between the mass and the superior mesenteric       artery, celiac trunk and portal vein suggesting a lack of invasion. The       remainder of the pancreas was examined. The endosonographic appearance       of parenchyma and the upstream pancreatic duct indicated duct dilation       and parenchymal atrophy. Fine needle biopsy was performed. Color Doppler       imaging was utilized prior to needle puncture to confirm a lack of       significant vascular structures within the needle path. Five passes were       made with the 22 gauge Acquire biopsy needle using a transduodenal       approach. Visible cores of tissue was obtained. Preliminary cytologic       examination and touch preps were performed. Final cytology results are       pending.      There was dilation in the common bile duct and in the common hepatic       duct which measured up to 8 mm.      One stent was visualized endosonographically within the common bile duct.      Moderate hyperechoic material consistent with sludge was visualized       endosonographically in the gallbladder.      Endosonographic imaging in the visualized portion of the liver showed no       mass.      Two enlarged lymph nodes were visualized in the celiac region (level 20)       and porta hepatis region. The largest measured 9 mm by 6 mm in maximal       cross-sectional diameter. The nodes were triangular, isoechoic and had       well defined margins. They are certainly enlarged but not clearly       malignant in visualization endosonographically.      The celiac region was visualized. Impression:  EGD impression:                           - No gross lesions in the entire esophagus. Z-line                            irregular, 39 cm from the incisors.                           - 1 cm hiatal hernia.                           -  Gastritis. Biopsied.                           - Plastic biliary stent at the ampulla.                           - No other gross lesions in the duodenal bulb, in                            the first portion of the duodenum and in the second                            portion of the duodenum.                           EUS impression                           - A mass was identified in the pancreatic head/genu                            of the pancreas. Cytology results are pending.                            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 The staging                            applies if malignancy is confirmed. Fine needle                            biopsy performed.                           - There was dilation in the common bile duct and in                            the common hepatic duct which measured up  to 8 mm.                           - One stent was visualized endosonographically                            within the common bile duct.                           - Hyperechoic material consistent with sludge was                            visualized endosonographically in the gallbladder.                           - Two enlarged lymph nodes were visualized in the                            celiac region (level 20) and porta hepatis region.                            Tissue has not been obtained. However, the                            endosonographic appearance is suggestive of benign                            inflammatory change based on the visualization                            endosonographically. Moderate Sedation:      Not Applicable - Patient had care per Anesthesia. Recommendation:           - The patient will be observed post-procedure,                            until all discharge criteria are met.                            - Discharge patient to home.                           - Patient has a contact number available for                            emergencies. The signs and symptoms of potential                            delayed complications were discussed with the                            patient. Return to normal activities tomorrow.                            Written discharge instructions were provided to the  patient.                           - Low fat diet for 1 week.                           - Observe patient's clinical course.                           - Await cytology results and await path results.                           - Recommend patient undergo CT chest to better                            define and ensure no evidence of any other disease                            elsewhere within the body.                           - As soon as cytology is back with underlying                            diagnosis defined, referral to oncology makes                            sense. Surgical oncology consideration for referral                            may be reasonable though at patient's age and other                            medical comorbidities, I am not sure if she would                            be an upfront surgical candidate.                           - The findings and recommendations were discussed                            with the patient.                           - The findings and recommendations were discussed                            with the designated responsible adult. Procedure Code(s):        --- Professional ---                           [phone], Esophagogastroduodenoscopy, flexible,  transoral; with transendoscopic ultrasound-guided                            intramural or transmural fine needle                            aspiration/biopsy(s), (includes endoscopic                            ultrasound  examination limited to the esophagus,                            stomach or duodenum, and adjacent structures)                           43239, 59, Esophagogastroduodenoscopy, flexible,                            transoral; with biopsy, single or multiple Diagnosis Code(s):        --- Professional ---                           K22.89, Other specified disease of esophagus                           K44.9, Diaphragmatic hernia without obstruction or                            gangrene                           K29.70, Gastritis, unspecified, without bleeding                           K86.89, Other specified diseases of pancreas                           K83.8, Other specified diseases of biliary tract                           R59.0, Localized enlarged lymph nodes                           R93.3, Abnormal findings on diagnostic imaging of                            other parts of digestive tract CPT copyright 2022 [employer]. All rights reserved. The codes documented in this report are preliminary and upon coder review may  be revised to meet current compliance requirements. Corliss Parish, MD 04/08/2023 2:34:33 PM Number of Addenda: 0

## 2023-04-08 NOTE — Telephone Encounter (Signed)
Let he rknow I am SO SORYY! Yes, cancel the 5/8 appointment and let her get a handle on what is going on and see [place] afterwards. Again, we pray it isn;t!

## 2023-04-09 ENCOUNTER — Encounter: Payer: Self-pay | Admitting: Gastroenterology

## 2023-04-09 LAB — CYTOLOGY - NON PAP

## 2023-04-09 LAB — SURGICAL PATHOLOGY

## 2023-04-09 NOTE — Telephone Encounter (Signed)
Left a voicemail message advising appt was cancelled and if they needed to discuss to call back the office to speak to a nurse.

## 2023-04-09 NOTE — Telephone Encounter (Signed)
Pt wanted to keep the appointment and would like a nurse to call her or Liborio Nixon to discuss please.

## 2023-04-10 ENCOUNTER — Encounter (HOSPITAL_COMMUNITY): Payer: Self-pay | Admitting: Gastroenterology

## 2023-04-10 ENCOUNTER — Other Ambulatory Visit: Payer: Self-pay

## 2023-04-10 DIAGNOSIS — C259 Malignant neoplasm of pancreas, unspecified: Secondary | ICD-10-CM

## 2023-04-11 ENCOUNTER — Telehealth: Payer: Self-pay | Admitting: Neurology

## 2023-04-11 NOTE — Progress Notes (Unsigned)
[facility] Telephone:([phone]) [phone]   Fax:(336) [phone]  CONSULT NOTE  REFERRING PHYSICIAN: Dr. Meridee Score   REASON FOR CONSULTATION:  Pancreatic Cancer   HPI Marie Ford is a 77 y.o. female with a past medical history significant for hypertension, GERD, Parkinson's Disease (controlled), osteoarthritis, osteopenia, depression, anxiety, and hyperlipidemia.   The patient's evaluation began on 03/27/2023 when she presented to the emergency room for the chief complaint of dark urine and jaundice.  She lives in independent living at [facility] in one of the nurses there realized she appeared jaundice. The CT abdomen and pelvis in the emergency room showed low-attenuation mass in the pancreatic head with involvement of the distal common bile duct with associated biliary ductal dilation and pancreatic atrophy.  There were no findings of metastatic disease or pathologically enlarged lymph nodes  Her LFTs were elevated with bilirubin of 8.6, AST of 199, ALT of 124, and alkaline phosphatase of 244.   She had an ERCP on 03/29/2023 under the care of Dr. Marina Goodell for which the patient underwent sphincterotomy, ductal brushings, and biliary stent placement.  Cytology showed atypical cells  Therefore, Dr. Meridee Score arranged for EUS on 04/08/2023 showing known pancreatic head lesion measuring 30 x 23 mm. An intact surface was seen between the mass and the superior mesenteric artery, celiac trunk, and portal vein suggesting lack of invasion which was confirmed by color Doppler imaging.  This was staged as T2, N0, MX by sonographic criteria.  There were 2 enlarged lymph nodes visualized in the celiac region and porta hepatis; however, the sonographic appearance was suggestive of benign inflammatory changes and no biopsy was obtained.   The final pathology ([id number]) showed malignant cells consistent with adenocarcinoma.  She is scheduled for CT scan the chest to complete the staging  workup on 04/17/2023  Patient is accompanied today by her daughter Marie Ford, who lives in [place].  She is also accompanied by one of her friends today.  Overall, the patient is feeling well today but she is nervous.  Her jaundice and dark urine have improved.  The patient's Parkinson's is under control.  She is currently taking Sinemet.  Today she denies any fever, chills, or frequent night sweats.  She lost approximately 5 pounds since November 2023.  She does report a poor appetite and she would be open to meeting a member the nutritionist team.  She denies any unusual chest pain, shortness of breath, cough, or hemoptysis.  She sometimes Ford have a odynophagia if she takes too many of her medications at 1 time.  While it is not listed in her medication list, she does have a prescription for ODT Zofran.  She denies any overt nausea within the last few days since 4/15. Reports sometimes she has a "sickly" feeling.  The Zofran is effective.  Denies any vomiting.  She was having a lot of itching previously which is improving.  She uses a certain lotion which forwardly works "instantly".  Denies any diarrhea or constipation.  Her stools have returned to a normal color.  Denies any unusual back pain or abdominal pain.  Mother had congestive heart failure.  Her father had a stroke.  She has another sister who is an alcoholic.  She had an older sister who had non-Hodgkin's lymphoma.  She also had a brother with colorectal cancer.  The patient used to work as a Psychologist, occupational.  She is divorced.  She has 1 daughter.  She is currently in independent living at [facility].  She is independent with her activities of daily living and does not have any significant deficits from her Parkinson's.  She denies alcohol, smoking, or drug use.    HPI  Past Medical History:  Diagnosis Date   Chronic kidney disease    stage 3    Chronic sphenoidal sinusitis    Degenerative arthritis of hip    requirde surgery 2009    Depression 2010   Diplopia 2011   in left lateral gaze    Diplopia    " my eyes dont track together, i have corrective prisms in my eye glasses"    Diverticulosis 2006   noted in a colonoscopy   Esophageal reflux 2013   Hypertension    IGT (impaired glucose tolerance) 2011   denies hx of diabetes    Insomnia 2009   Mixed hyperlipidemia 2000   Osteoarthritis    Osteopenia 2005   Other and unspecified hyperlipidemia 2000   Other specified disease of sebaceous glands    Palpitations 2009   secondary to PSVT   Parkinson disease    PNA (pneumonia) 2014   Psoriasis 2004   PSVT (paroxysmal supraventricular tachycardia) 2009   "report it has been some time since i had that "    Situational stress    Venous insufficiency since 2017    Past Surgical History:  Procedure Laterality Date   BILIARY BRUSHING  03/29/2023   Procedure: BILIARY BRUSHING;  Surgeon: Hilarie Fredrickson, MD;  Location: [facility] ENDOSCOPY;  Service: Gastroenterology;;   BILIARY STENT PLACEMENT  03/29/2023   Procedure: BILIARY STENT PLACEMENT;  Surgeon: Hilarie Fredrickson, MD;  Location: [facility] ENDOSCOPY;  Service: Gastroenterology;;   BIOPSY  04/08/2023   Procedure: BIOPSY;  Surgeon: Lemar Lofty., MD;  Location: [facility] ENDOSCOPY;  Service: Gastroenterology;;   CATARACT EXTRACTION W/ INTRAOCULAR LENS  IMPLANT, BILATERAL     COLONOSCOPY  01/30/2010   Jarold Motto   ERCP N/A 03/29/2023   Procedure: ENDOSCOPIC RETROGRADE CHOLANGIOPANCREATOGRAPHY (ERCP);  Surgeon: Hilarie Fredrickson, MD;  Location: [facility] ENDOSCOPY;  Service: Gastroenterology;  Laterality: N/A;   ESOPHAGOGASTRODUODENOSCOPY (EGD) WITH PROPOFOL N/A 04/08/2023   Procedure: ESOPHAGOGASTRODUODENOSCOPY (EGD) WITH PROPOFOL;  Surgeon: Meridee Score Netty Starring., MD;  Location: [facility] ENDOSCOPY;  Service: Gastroenterology;  Laterality: N/A;   ETHMOIDECTOMY Left 11/17/2020   Procedure: LEFT SIDED TOTAL ETHMOIDECTOMY;  Surgeon: Drema Halon, MD;  Location: [facility];   Service: ENT;  Laterality: Left;   EUS N/A 04/08/2023   Procedure: UPPER ENDOSCOPIC ULTRASOUND (EUS) LINEAR;  Surgeon: Lemar Lofty., MD;  Location: [facility] ENDOSCOPY;  Service: Gastroenterology;  Laterality: N/A;   EYE SURGERY     FINE NEEDLE ASPIRATION N/A 04/08/2023   Procedure: FINE NEEDLE ASPIRATION (FNA) LINEAR;  Surgeon: Lemar Lofty., MD;  Location: [facility] ENDOSCOPY;  Service: Gastroenterology;  Laterality: N/A;   HEMORRHOID SURGERY     JOINT REPLACEMENT     LASIK     left medial and superior rectus muscle recesion for diplopia correction     SINUS ENDO WITH FUSION Left 11/17/2020   Procedure: SINUS ENDOSCOPY WITH FUSION NAVIGATION;  Surgeon: Drema Halon, MD;  Location: [facility];  Service: ENT;  Laterality: Left;   SPHENOIDECTOMY Left 11/17/2020   Procedure: LEFT SIDED SPHENOIDECTOMY WITH TISSUE REMOVAL;  Surgeon: Drema Halon, MD;  Location: [facility];  Service: ENT;  Laterality: Left;   SPHINCTEROTOMY  03/29/2023   Procedure: SPHINCTEROTOMY;  Surgeon: Hilarie Fredrickson, MD;  Location: [facility] ENDOSCOPY;  Service: Gastroenterology;;   surgery of right eye rectus muscles for diplpia correction   2019   TOTAL HIP ARTHROPLASTY Right 2010   TOTAL HIP ARTHROPLASTY Left 08/19/2019   Procedure: TOTAL HIP ARTHROPLASTY ANTERIOR APPROACH;  Surgeon: Ollen Gross, MD;  Location: [facility] ORS;  Service: Orthopedics;  Laterality: Left;    TURBINATE REDUCTION Left 11/17/2020   Procedure: LEFT SIDED TURBINATE REDUCTION;  Surgeon: Drema Halon, MD;  Location: [facility];  Service: ENT;  Laterality: Left;   URETHRAL SLING     midurethral sling with TVT Exact and Cystoscopy    Family History  Problem Relation Age of Onset   Congestive Heart Failure Mother    CVA Father    Heart failure Father    Non-Hodgkin's lymphoma Sister    Alcohol abuse Sister    Liver cancer Brother    Colon cancer Brother 45   Colitis Brother     Hypertension Brother    Gout Brother    Melanoma Daughter    Parkinson's disease Cousin    Esophageal cancer Neg Hx    Rectal cancer Neg Hx    Stomach cancer Neg Hx    Colon polyps Neg Hx     Social History Social History   Tobacco Use   Smoking status: Never   Smokeless tobacco: Never  Vaping Use   Vaping Use: Never used  Substance Use Topics   Alcohol use: Yes    Comment: rarely   Drug use: No    Allergies  Allergen Reactions   Erythromycin Shortness Of Breath and Rash   Macrobid [Nitrofurantoin] Other (See Comments)    Pass out   Keflex [Cephalexin] Nausea And Vomiting   Levofloxacin Nausea Only   Penicillins Rash    Did it involve swelling of the face/tongue/throat, SOB, or low BP? No Did it involve sudden or severe rash/hives, skin peeling, or any reaction on the inside of your mouth or nose? No Did you need to seek medical attention at a hospital or doctor's office? No When did it last happen? More than 10 years ago If all above answers are "NO", Ford proceed with cephalosporin use.    Sulfamethoxazole-Trimethoprim Rash    Current Outpatient Medications  Medication Sig Dispense Refill   ALPRAZolam (XANAX) 0.5 MG tablet Take 0.25 mg by mouth as needed for anxiety. Takes as needed for sleep     AUVELITY 45-105 MG TBCR Take 1 tablet by mouth every evening.     buPROPion (WELLBUTRIN XL) 150 MG 24 hr tablet Take 150 mg by mouth daily.     Calcium Carbonate Antacid (CALCIUM CARBONATE PO) Take by mouth.     carbidopa-levodopa (SINEMET IR) 25-100 MG tablet Take 1 tablet by mouth every 4 (four) hours as needed. (Patient taking differently: Take 1 tablet by mouth every 4 (four) hours.) 180 tablet 11   ezetimibe (ZETIA) 10 MG tablet Take 10 mg by mouth daily.     losartan (COZAAR) 50 MG tablet Take 1 tablet (50 mg total) by mouth daily. 90 tablet 1   Multiple Vitamin (MULTIVITAMIN WITH MINERALS) TABS tablet Take 1 tablet by mouth daily. Women's Multivitamin      pantoprazole (PROTONIX) 40 MG tablet Take 40 mg by mouth daily.     Polyethyl Glycol-Propyl Glycol (SYSTANE) 0.4-0.3 % SOLN Place 1 drop into both eyes 3 (three) times daily as needed (dry/irritated eyes.).     [START ON 04/13/2023] pravastatin (PRAVACHOL) 80 MG tablet Take 1 tablet (80 mg  total) by mouth every evening.     triamcinolone (NASACORT) 55 MCG/ACT AERO nasal inhaler Place 2 sprays into the nose daily. Use at night (Patient taking differently: Place 1 spray into the nose daily. Use at night) 1 each 12   No current facility-administered medications for this visit.    REVIEW OF SYSTEMS:   Review of Systems  Constitutional: Positive for appetite change and mild weight loss.  Negative for chills or fever and unexpected weight change.  HENT: Negative for mouth sores, nosebleeds, sore throat and trouble swallowing.   Eyes: Negative for eye problems and icterus.  Respiratory: Negative for cough, hemoptysis, shortness of breath and wheezing.   Cardiovascular: Negative for chest pain and leg swelling.  Gastrointestinal: Positive for mild occasional nausea. Negative for abdominal pain, constipation, diarrhea, and vomiting.  Genitourinary: Negative for bladder incontinence, difficulty urinating, dysuria, frequency and hematuria.   Musculoskeletal: Negative for back pain, gait problem, neck pain and neck stiffness.  Skin: Negative for rash but positive for itching which is improving Neurological: Negative for dizziness, extremity weakness, gait problem, headaches, light-headedness and seizures.  Hematological: Negative for adenopathy. Does not bruise/bleed easily.  Psychiatric/Behavioral: Negative for confusion, depression and sleep disturbance. The patient is not nervous/anxious.     PHYSICAL EXAMINATION:  There were no vitals taken for this visit.  ECOG PERFORMANCE STATUS: 1  Physical Exam  Constitutional: Oriented to person, place, and time and well-developed, well-nourished, and in no  distress.  HENT:  Head: Normocephalic and atraumatic.  Mouth/Throat: Oropharynx is clear and moist. No oropharyngeal exudate.  Eyes: No obvious scleral icterus. conjunctivae are normal. Right eye exhibits no discharge. Left eye exhibits no discharge. No scleral icterus.  Neck: Normal range of motion. Neck supple.  Cardiovascular: Normal rate, regular rhythm, normal heart sounds and intact distal pulses.   Pulmonary/Chest: Effort normal and breath sounds normal. No respiratory distress. No wheezes. No rales.  Abdominal: Soft. Bowel sounds are normal. Exhibits no distension and no mass. There is no tenderness.  Musculoskeletal: Normal range of motion. Exhibits no edema.  Lymphadenopathy:    No cervical adenopathy.  Neurological: Alert and oriented to person, place, and time. Exhibits normal muscle tone. Gait normal. Coordination normal.  Skin: No jaundice at this time.  Skin is warm and dry. No rash noted. Not diaphoretic. No erythema. No pallor.  Psychiatric: Mood, memory and judgment normal.  Vitals reviewed.  LABORATORY DATA: Lab Results  Component Value Date   WBC 6.6 03/30/2023   HGB 12.1 03/30/2023   HCT 35.1 (L) 03/30/2023   MCV 94.4 03/30/2023   PLT 335 03/30/2023      Chemistry      Component Value Date/Time   NA 135 03/30/2023 0332   K 3.7 03/30/2023 0332   CL 104 03/30/2023 0332   CO2 23 03/30/2023 0332   BUN 14 03/30/2023 0332   CREATININE 0.99 03/30/2023 0332      Component Value Date/Time   CALCIUM 8.6 (L) 03/30/2023 0332   ALKPHOS 149 (H) 04/05/2023 0958   AST 32 04/05/2023 0958   ALT 26 04/05/2023 0958   BILITOT 1.7 (H) 04/05/2023 0958       RADIOGRAPHIC STUDIES: DG ERCP  Result Date: 03/30/2023 CLINICAL DATA:  ERCP with stent placement Acute abdominal pain Pancreatic head mass EXAM: ERCP TECHNIQUE: Multiple spot images obtained with the fluoroscopic device and submitted for interpretation post-procedure. FLUOROSCOPY: Radiation Exposure Index (as  provided by the fluoroscopic device): 56 mGy Kerma COMPARISON:  CT abdomen pelvis 03/27/2023  FINDINGS: 11 intraoperative fluoroscopic images were submitted for interpretation. The submitted images demonstrate cannulation and opacification of the intra and extrahepatic bile ducts which are moderately dilated. Final images demonstrate placement of a biliary stent with decompression of the biliary tree. IMPRESSION: Intraoperative fluoroscopic images show dilated intra or extrahepatic bile ducts with high-grade stenosis at the level of the distal CBD. Biliary tree is decompressed following placement of common bile duct stent. These images were submitted for radiologic interpretation only. Please see the procedural report for the amount of contrast and the fluoroscopy time utilized. Electronically Signed   By: Acquanetta Belling M.D.   On: 03/30/2023 06:58   CT ABDOMEN PELVIS W CONTRAST  Result Date: 03/27/2023 CLINICAL DATA:  Acute abdominal pain EXAM: CT ABDOMEN AND PELVIS WITH CONTRAST TECHNIQUE: Multidetector CT imaging of the abdomen and pelvis was performed using the standard protocol following bolus administration of intravenous contrast. RADIATION DOSE REDUCTION: This exam was performed according to the departmental dose-optimization program which includes automated exposure control, adjustment of the mA and/or kV according to patient size and/or use of iterative reconstruction technique. CONTRAST:  65mL OMNIPAQUE IOHEXOL 300 MG/ML  SOLN COMPARISON:  CT 07/10/2022 and older FINDINGS: Lower chest: There is some linear opacity lung bases likely scar or atelectasis. No pleural effusion. Hepatobiliary: Since the prior there is new moderate biliary ductal dilatation. The gallbladder is also dilated. No space-occupying liver lesion. Pancreas: There is severe atrophy of the pancreas which is progressive with severe ductal dilatation and a mass lesion in the pancreatic head measuring on series 2 image 21 2.4 x 1.8 cm. This  measures 2.3 cm on coronal image 59 of series 5. Findings are consistent with a pancreatic neoplasm until proven otherwise. This involves the course of the common duct with a focal stricture. There is some nodularity along the wall of the duct as well as seen coronal image 58. This abuts the portal venous confluence without narrowing or encasement. The SMA is preserved. Spleen: Normal in size without focal abnormality. Adrenals/Urinary Tract: Slight thickening of the adrenal glands. No enhancing renal mass or collecting system dilatation. The bladder is grossly preserved but is partially obscured by the significant streak artifact from the bilateral hip arthroplasties. Stomach/Bowel: Moderate colonic stool. Few colonic diverticula. No bowel obstruction. Normal appendix in the right lower quadrant stomach is underdistended. Small bowel is nondilated. Vascular/Lymphatic: Normal caliber aorta and IVC with scattered vascular calcifications. There are some small porta hepatic lymph nodes but none that are pathologically enlarged by size criteria. Reproductive: Uterus is present. No obvious adnexal mass but again portions of the pelvis are obscured by the streak artifact from the bilateral hip arthroplasties. Other: No free air or free fluid. Musculoskeletal: Curvature of the spine. Moderate degenerative changes. Bilateral hip arthroplasties. IMPRESSION: Low-attenuation mass centered along the pancreatic head with involvement of the distal common duct with the associated biliary duct ductal dilatation and pancreatic atrophy. Changes are worrisome for neoplasm either a pancreatic neoplasm or cholangiocarcinoma. Recommend further evaluation. No liver metastases. No pathologically enlarged upper abdominal nodes. Diffuse colonic stool.  Normal appendix.  Few diverticula. Portions of the pelvis are obscured by the streak artifact from the bilateral hip arthroplasties. Critical Value/emergent results were called by telephone at  the time of interpretation on 03/27/2023 at 2:11 pm to provider [facility] , who verbally acknowledged these results. Electronically Signed   By: Karen Kays M.D.   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  No evidence of lymphadenopathy or metastatic disease to the liver -Had ERCP and stent placement on 4/19 -Underwent EUS by Dr. Meridee Score on 04/08/23 showing 30 x 23 mm pancreatic head lesion without any vascular involvement.  -The final pathology showed pancreatic adenocarcinoma -She is scheduled for a CT scan of the chest to complete the staging workup on 04/17/2023 -She was seen with Dr. [name] today.  Dr. [name] had a lengthy discussion with the patient today about her current condition and principles of treatment. -Discussed that this is an aggressive type of cancer.  As of now it appears this is early stage as long at her CT of the chest does not show metastatic disease.  The only curative option is surgery and this appears resectable.  Discussed rationale of why patients Ford have chemotherapy before/after surgery to lower the risk of recurrence. -We will refer the patient to surgery to see if she is a surgical candidate. -Briefly discussed if she is to proceed with chemotherapy that there are different 2 and 3 drug regimens including FOLFIRINOX and gemcitabine and Abraxane.  The patient would likely not be a candidate for FOLFIRINOX given her age and comorbidities.  -We briefly discussed side effects of chemotherapy including fatigue, nausea, vomiting, diarrhea, hair loss, cold sensitivity, peripheral neuropathy, fluid retention, kidney and  liver dysfunction, neutropenic fever, need for blood transfusion, and bleeding. -The patient is not a surgical candidate or she declined surgery, we discussed other options such as chemotherapy for 3 to 4 months, followed by radiation.  This Ford prolong her life and control her disease.  However, the chance of cure is low and at some point most patients will have evidence of recurrence or progression. -We will discuss the patient's case at the multidisciplinary conference -We will see the patient back after she is evaluated by surgery.  If she is to proceed with neoadjuvant treatment, we will discuss this in more depth at that time and discussed a Port-A-Cath, antiemetics, chemo education class -The goal of chemotherapy is curative. However Dr. [name] discussed even with surgery and hemotherapy, only 30% of patients Ford be cured as there is a high risk for recurrence. -Will obtain baseline labs today of CBC, CMP, and CA 19.9 -Will also arrange for genetic testing today   Nausea, poor appetite, and itching  -She is prescribed ODT Zofran for nausea which is effective.  -She denies any pain -She uses a topical lotion for her itching which is effective for her.  She does not feel that she needs any additional therapies for her itching as it is controlled and improving. -She would be interested in seeing a member the nutritionist team.  I have placed a referral  Jaundice -Presented with obstructive jaundice on 03/27/2023 -Max bilirubin 8.6 (4/17) improved to 1.5 on 04/12/23 -Status post stent placement ERCP on 4/19  Genetic counseling She qualifies for genetic testing given her pancreatic cancer diagnosis -She will have genetic testing performed today, if positive, then we would refer her to genetic counseling  Morbidities, Parkinson's disease, CKD -She does not have a lot of symptoms from her Parkinson's disease and states this is under control -She is currently on Sinemet -Creatinine  stable   PLAN: -  Referral to surgery -Baseline labs with CBC, CMP, and CA 19-9 -Referral to nutritionist -Will get genetic testing today  The patient voices understanding of current disease status and treatment options and is in agreement with the current care plan.  All questions were answered. The patient knows to call the clinic with any problems, questions or concerns. We can certainly see the patient much sooner if necessary.  Thank you so much for allowing me to participate in the care of Marie Ford. I will continue to follow up the patient with you and assist in her care.  Disclaimer: This note was dictated with voice recognition software. Similar sounding words can inadvertently be transcribed and Ford not be corrected upon review.   Marie Ford 2, 2024, 5:16 PM   Addendum  I have seen the patient, examined her. I agree with the assessment and and plan and have edited the notes.   77 year old female, with past medical history of mild Parkinson disease, depression, hypertension, GERD, very functional, was referred for newly diagnosed pancreatic cancer.  I have personally reviewed her staging CT, and endoscopy reports.  She has a 2.4 cm mass in the head of pancreas, no significant vascular invasion on CT and [place].  Biopsy of the mass confirmed adenocarcinoma.  Will obtain CT chest to complete staging.  She has resectable disease, we will refer her to pancreatobiliary surgeon Dr. Donell Beers.  We discussed the nature course of pancreatic cancer, and high risk of recurrence after surgery.  Neoadjuvant or adjuvant chemotherapy is indicated to reduce her risk of recurrence.  I discussed option of FOLFIRINOX and gemcitabine/Abraxane.  She is 77 year old, but is still functioning very well, I think gemcitabine and Abraxane is more practical regimen for her.  However she is concerned about potential side effect of chemotherapy, and will think about.  We also discussed radiation  therapy if surgery is not planned.  Her friend and the daughter asked appropriate questions, I answered to their satisfaction.  Plan to meet her again in a few weeks after she meets Dr. Donell Beers.  We will present her case in our GI conference next week.    Malachy Mood MD 04/12/2023

## 2023-04-11 NOTE — Telephone Encounter (Signed)
error 

## 2023-04-12 ENCOUNTER — Inpatient Hospital Stay: Payer: Medicare PPO | Admitting: Physician Assistant

## 2023-04-12 ENCOUNTER — Other Ambulatory Visit: Payer: Self-pay | Admitting: Genetic Counselor

## 2023-04-12 ENCOUNTER — Other Ambulatory Visit: Payer: Self-pay

## 2023-04-12 ENCOUNTER — Inpatient Hospital Stay: Payer: Medicare PPO | Attending: Physician Assistant

## 2023-04-12 VITALS — BP 133/78 | HR 80 | Temp 98.1°F | Resp 18 | Ht 66.0 in | Wt 142.6 lb

## 2023-04-12 DIAGNOSIS — G20A1 Parkinson's disease without dyskinesia, without mention of fluctuations: Secondary | ICD-10-CM | POA: Diagnosis not present

## 2023-04-12 DIAGNOSIS — N189 Chronic kidney disease, unspecified: Secondary | ICD-10-CM | POA: Diagnosis not present

## 2023-04-12 DIAGNOSIS — R630 Anorexia: Secondary | ICD-10-CM | POA: Diagnosis not present

## 2023-04-12 DIAGNOSIS — C250 Malignant neoplasm of head of pancreas: Secondary | ICD-10-CM

## 2023-04-12 DIAGNOSIS — R110 Nausea: Secondary | ICD-10-CM

## 2023-04-12 DIAGNOSIS — C259 Malignant neoplasm of pancreas, unspecified: Secondary | ICD-10-CM | POA: Insufficient documentation

## 2023-04-12 DIAGNOSIS — R17 Unspecified jaundice: Secondary | ICD-10-CM

## 2023-04-12 DIAGNOSIS — Z808 Family history of malignant neoplasm of other organs or systems: Secondary | ICD-10-CM | POA: Diagnosis not present

## 2023-04-12 DIAGNOSIS — L299 Pruritus, unspecified: Secondary | ICD-10-CM

## 2023-04-12 LAB — CMP (CANCER CENTER ONLY)
ALT: 22 U/L (ref 0–44)
AST: 33 U/L (ref 15–41)
Albumin: 4 g/dL (ref 3.5–5.0)
Alkaline Phosphatase: 137 U/L — ABNORMAL HIGH (ref 38–126)
Anion gap: 5 (ref 5–15)
BUN: 25 mg/dL — ABNORMAL HIGH (ref 8–23)
CO2: 30 mmol/L (ref 22–32)
Calcium: 9.4 mg/dL (ref 8.9–10.3)
Chloride: 106 mmol/L (ref 98–111)
Creatinine: 1.1 mg/dL — ABNORMAL HIGH (ref 0.44–1.00)
GFR, Estimated: 51 mL/min — ABNORMAL LOW (ref >60)
Glucose, Bld: 104 mg/dL — ABNORMAL HIGH (ref 70–99)
Potassium: 4 mmol/L (ref 3.5–5.1)
Sodium: 141 mmol/L (ref 135–145)
Total Bilirubin: 1.5 mg/dL — ABNORMAL HIGH (ref 0.3–1.2)
Total Protein: 7.2 g/dL (ref 6.5–8.1)

## 2023-04-12 LAB — CBC WITH DIFFERENTIAL (CANCER CENTER ONLY)
Abs Immature Granulocytes: 0.03 10*3/uL (ref 0.00–0.07)
Basophils Absolute: 0.1 10*3/uL (ref 0.0–0.1)
Basophils Relative: 1 %
Eosinophils Absolute: 0.3 10*3/uL (ref 0.0–0.5)
Eosinophils Relative: 3 %
HCT: 41 % (ref 36.0–46.0)
Hemoglobin: 14.2 g/dL (ref 12.0–15.0)
Immature Granulocytes: 0 %
Lymphocytes Relative: 22 %
Lymphs Abs: 1.7 10*3/uL (ref 0.7–4.0)
MCH: 33.2 pg (ref 26.0–34.0)
MCHC: 34.6 g/dL (ref 30.0–36.0)
MCV: 95.8 fL (ref 80.0–100.0)
Monocytes Absolute: 0.9 10*3/uL (ref 0.1–1.0)
Monocytes Relative: 11 %
Neutro Abs: 5.1 10*3/uL (ref 1.7–7.7)
Neutrophils Relative %: 63 %
Platelet Count: 396 10*3/uL (ref 150–400)
RBC: 4.28 MIL/uL (ref 3.87–5.11)
RDW: 12.4 % (ref 11.5–15.5)
WBC Count: 8 10*3/uL (ref 4.0–10.5)
nRBC: 0 % (ref 0.0–0.2)

## 2023-04-12 LAB — GENETIC SCREENING ORDER

## 2023-04-12 NOTE — Patient Instructions (Addendum)
I know we covered a lot of important information at your appointment today.  Here is a summary of the main take away points. -There is a spot in the pancreas that is about 3 x 2.3 cm.  Dr. Meridee Score took a piece of this area during the procedure you had on 04/08/2023.  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 -It does look like that this could potentially be surgically removed since there is no spread and it does not look like any major structures are involved.  While sometimes the spot being in the pancreatic head can prompt earlier evaluation and diagnosis because a lot of patients may experience jaundice, it also can be a little bit of a tricky locations technically for the surgeons because there are a lot of important structures nearby so it is a big surgery. The surgery is called a whipple procedure.  The surgeon can talk to you about the logistics in this and more detail when you see them next week or the following week.  -Surgery is the only option for potential cure.  The risk of the cancer coming back after surgery is high in patients who do not get chemotherapy which is what our role is.  We would like to refer you to a surgeon so she can evaluate you and discuss surgery in more detail if you and her feel that surgery is right for you.  -Getting chemotherapy before and after surgery can reduce the risk of the cancer coming back.  Overall, with the patient is getting chemotherapy and surgery, about 30% of patients will be cured -If you get chemotherapy, there are many different drug regimens ranging from a 1-3 drug regimens. A) the most effective 3 drug regimen is called FOLFIRINOX. This treatment might be a little too difficult for you so generally Dr. [name] does not use this option in patient's with advanced age. Just for completion, I will review this.  This is a combination of 3  medications.  This treatment is given once every 2 weeks.  This has to be given through a device called a Port-A-Cath which Gardiner Coins showed you today.  A surgeon would put in a Port-A-Cath.  You would come into the clinic and get your treatment and then you will go home with a fanny pack that is pumping a vitamin through the Port-A-Cath for another 2 days.  Then you would come in to the clinic to have this removed.  While this may be the most effective option, this also has more side effects including fatigue, drops in the blood count, cold sensitivity, neuropathy, nausea and vomiting, diarrhea/constipation, appetite, etc. of course, no patient is locked into his decision.  Therefore if you try anything and have intolerance, we could always change or reduced doses.  B) Formore advanced aged patients, Dr. [name] typically may favor a 2 drug treatment regimen with 2 chemotherapy drugs called Abraxane and gemcitabine.  This treatment may be given a few different ways.  This may be given in an IV once a week for 2 weeks in a row, and then you are off treatment for 1 week, then you to return once a week for 2 weeks again, off week, and so on.  Sometimes, Dr. [name] may give this treatment once a week every other week for better tolerability.  This treatment regimen can cause fatigue and  drops in the blood count but tends to have less significant side effects and less nausea.  This also does not cause any cold sensitivity.  You do not have to have a port for this treatment and this is given an IV.  However, if you wanted a port we can always arrange for that.  For this treatment, you do not need a fanny pack when you go home and you do not need to come back 2 days later to have that removed C) some patients may get 1 chemotherapy medicine alone called gemcitabine.  This is overall the most tolerable although this is the least effective and is generally reserved for patients where there are no other options available or  cannot take the above regimens.  -If you go on chemotherapy, we will see you back in the clinic and discuss it again in more detail first  We will also arrange for appointments, prescriptions, and chemo education appointments.  -You are not interested in surgery, then some patients may get chemotherapy for 3 to 4 months, followed by radiation.  This could prolong your life and delay the cancer growing.  However the chance of cure with this is low and at some point the cancer may start growing. -Would like you to have genetic testing performed today to make sure you do not have any gene mutations that predispose you to cancer.  If so, we would want you to meet with our genetic counselor and we would want your daughter to get tested as well.  Sometimes if you have certain gene mutations, we may have other treatment options for certain gene mutations, although those treatment options also are not curative but can prolong life with some disease control -You are also obtaining what we call a "tumor marker".  This sometimes can give [place] an idea of how advanced the cancer is.  -Know this sounds daunting but we are here for you and are here to help you get through this.  This do not hesitate to send a MyChart message or call us with any questions.  Our numberis [phone]  Plan: -Will refer you to the nutritionist -Arrange for genetic testing today.  If this is positive for a heritable gene mutation that can predispose you or family to developing cancer, then we would refer you to sit down to talk to one of our genetic counselors and we would recommend your daughter be tested -Refer you to surgeon -Discussed your case at the multidisciplinary conference next week -We will see you after you see surgeon to discuss in more detail and see where you are leaning after talking to your family. I have added some information about chemotherapy above

## 2023-04-12 NOTE — Addendum Note (Signed)
Addended by: Christiane Ha on: 04/12/2023 01:10 PM   Modules accepted: Orders

## 2023-04-12 NOTE — Progress Notes (Signed)
I met with Ms Steff, her daughter, and friend after  her consultation with Cassie Heilingoetter, PA-C and  Dr [name].  I explained my role as a nurse navigator and provided my contact information. I explained the services provided at [facility] and provided written information.  I explained the alight grant and let  her know she will receive more information about that at the time of her chemo education class. I briefly explained insertion and care of a port a cath.  I showed a sample of the port a cath.  I briefly reviewed common side effects associated with the recommended chemotherapy regimen,  gemzar/abraxane. I let them know I will fax a referral to [facility].   All questions were answered. They verbalized understanding.

## 2023-04-12 NOTE — Progress Notes (Signed)
Referral, ov note, insurance information, and demographics faxed to [facility].

## 2023-04-13 LAB — CANCER ANTIGEN 19-9: CA 19-9: 512 U/mL — ABNORMAL HIGH (ref 0–35)

## 2023-04-15 ENCOUNTER — Telehealth: Payer: Self-pay | Admitting: Genetic Counselor

## 2023-04-15 NOTE — Telephone Encounter (Signed)
TRF submitted for [facility] BRCAPlus and CancerNext-Expanded +RNAinsight Panel.

## 2023-04-16 ENCOUNTER — Telehealth: Payer: Self-pay | Admitting: *Deleted

## 2023-04-16 LAB — LAB REPORT - SCANNED: EGFR: 50

## 2023-04-16 NOTE — Telephone Encounter (Signed)
Patient called with her friend present. She has had some progressing mid/upper back pain that started on Sunday along with increasing diarrhea. She has been having 3-4 light colored, mushy, non formed stools a day. She has not tried imodium or any pain relief. She rates pain a 4 on a 1-10 scale. Urine has been clear and normal. Her appetite is poor (this has been her normal), fluid intake has been low.  Discussed taking imodium with patient now and after her next loose stool. She will also take some acetaminophen as needed for her pain. Not to exceed 1G in 24 hours. Patient advised to call office if symptoms continue or do not improve.  She has a CT Chest scheduled for tomorrow evening.

## 2023-04-17 ENCOUNTER — Other Ambulatory Visit: Payer: Self-pay

## 2023-04-17 ENCOUNTER — Ambulatory Visit (HOSPITAL_BASED_OUTPATIENT_CLINIC_OR_DEPARTMENT_OTHER): Payer: Medicare PPO

## 2023-04-17 ENCOUNTER — Ambulatory Visit: Payer: Medicare PPO | Admitting: Neurology

## 2023-04-17 ENCOUNTER — Telehealth (HOSPITAL_BASED_OUTPATIENT_CLINIC_OR_DEPARTMENT_OTHER): Payer: Self-pay

## 2023-04-17 ENCOUNTER — Telehealth: Payer: Self-pay | Admitting: Medical Oncology

## 2023-04-17 NOTE — Progress Notes (Signed)
The proposed treatment discussed in conference is for discussion purpose only and is not a binding recommendation.  The patients have not been physically examined, or presented with their treatment options.  Therefore, final treatment plans cannot be decided.  

## 2023-04-17 NOTE — Telephone Encounter (Signed)
For Marie Ford requested to correct her note to state  " I did have nausea and vomiting early April -April 15th and took Zofran." Pt had told Cassie she did not have any N/V.

## 2023-04-20 ENCOUNTER — Ambulatory Visit (HOSPITAL_BASED_OUTPATIENT_CLINIC_OR_DEPARTMENT_OTHER)
Admission: RE | Admit: 2023-04-20 | Discharge: 2023-04-20 | Disposition: A | Discharge status: Home / Self Care | Payer: Medicare PPO | Source: Ambulatory Visit | Attending: Gastroenterology | Admitting: Gastroenterology

## 2023-04-20 DIAGNOSIS — C259 Malignant neoplasm of pancreas, unspecified: Secondary | ICD-10-CM | POA: Insufficient documentation

## 2023-04-20 MED ORDER — IOHEXOL 300 MG/ML  SOLN
80.0000 mL | Freq: Once | INTRAMUSCULAR | Status: AC | PRN
  Administered 2023-04-20: 80 mL via INTRAVENOUS

## 2023-04-22 ENCOUNTER — Inpatient Hospital Stay: Payer: Medicare PPO

## 2023-04-22 ENCOUNTER — Other Ambulatory Visit: Payer: Self-pay

## 2023-04-22 NOTE — Progress Notes (Signed)
Nutrition Assessment   Reason for Assessment:   Referral from Dr [name], new pancreatic cancer   ASSESSMENT:  77 year old female with pancreatic cancer.  Past medical history of HTN, GERD, PD, OA, Depression, anxiety, HLD.  Referral sent to surgery to see if patient is a surgical candidate.  If not then noted MD will discuss chemotherapy options.  Met with patient and friend, Ciro Backer.  Patient reports poor appetite for the past 6 months.  States that she does not feel well in the morning and does not eat very much (stomach unsettled).  Has zofran ODT that she takes at times but does not sound like she is taking consistently.  Reports constipation which she takes stool softner and laxative when needed.  Lives at [facility] and has the option to go to dinning room for each meal.  Sometimes goes for dinner (meat, starch and salad).  Lunch maybe homemade soup (friend prepares) or cheese toast (1 slice bread) or 1/2 banana .  Breakfast is shredded wheat with fruit and yogurt.  Has started drinking ensure max protein in the last week or so, maybe 1 a day.      Nutrition Focused Physical Exam:   Orbital Region: WNL Buccal Region: WNL Upper Arm Region: WNL Thoracic and Lumbar Region: WNL Temple Region: mild Clavicle Bone Region: WNL Shoulder and Acromion Bone Region: not observed Scapular Bone Region: not observed Dorsal Hand: WNL Patellar Region: WNL Anterior Thigh Region: WNL Posterior Calf Region: WNL Edema (RD assessment): none Hair: reviewed Eyes: reviewed Mouth: reviewed Skin: reviewed Nails: reviewed   Medications: zofran ODT 4 mg, MVI, protonix, calcium carbonate   Labs: reviewed   Anthropometrics:   Height: 66 inches Weight: 143 lb 8 oz today 148 lb 10/17/22 BMI: 23  3% weight loss in the last 6 months   Estimated Energy Needs  Kcals: 1625-1950 Protein: 81-98 g Fluid: 1625-1950 ml   NUTRITION DIAGNOSIS: Inadequate oral intake related to  altered GI function (stomach unsettled, nausea, bloating, abdominal pain as evidenced by 3% weight loss in the last 6 months and poor appetite   INTERVENTION:  Encouraged taking zofran ODT consistently to see if improves symptoms to allow her to eat more.  If current prescription not working encouraged to reach out to Dr [name].  Encouraged being proactive with bowel regimen to promote regular bowel movement.  Discussed increasing fluid intake to improve constipation Recommend 350 calorie oral nutrition supplement at least daily, if able. Samples of ensure complete provided and coupons Encouraged "nibbling" q 2 hours including protein food. Handout on protein foods provided Discussed adding heart healthy fats to increase calories but will need to monitor abdominal pain, stomach upset.   Contact information provided      MONITORING, EVALUATION, GOAL: weight trends, intake   Next Visit: to be determined  Rashanda Magloire B. Freida Busman, RD, LDN Registered Dietitian [phone]

## 2023-04-26 ENCOUNTER — Other Ambulatory Visit (HOSPITAL_COMMUNITY): Payer: Medicare PPO

## 2023-04-29 ENCOUNTER — Encounter: Payer: Self-pay | Admitting: Genetic Counselor

## 2023-04-29 ENCOUNTER — Encounter: Payer: Self-pay | Admitting: Gastroenterology

## 2023-04-29 DIAGNOSIS — Z1379 Encounter for other screening for genetic and chromosomal anomalies: Secondary | ICD-10-CM | POA: Insufficient documentation

## 2023-04-29 NOTE — Progress Notes (Signed)
Outside laboratories from 04/15/2023 to be scanned in the chart  AST/ALT 64/53 Total bili 1.4 Alkaline phosphatase 232 BUN/creatinine 27/1.12 Sodium 141 Potassium 4.3 WBC 9.4 Hemoglobin/hematocrit 14/41.4 Platelet count 454 MCV 94.5

## 2023-05-03 ENCOUNTER — Inpatient Hospital Stay: Payer: Medicare PPO | Admitting: Hematology

## 2023-05-03 ENCOUNTER — Encounter: Payer: Self-pay | Admitting: Hematology

## 2023-05-03 ENCOUNTER — Other Ambulatory Visit: Payer: Self-pay

## 2023-05-03 VITALS — BP 154/81 | HR 91 | Temp 98.3°F | Resp 18 | Wt 143.7 lb

## 2023-05-03 DIAGNOSIS — C250 Malignant neoplasm of head of pancreas: Secondary | ICD-10-CM | POA: Diagnosis not present

## 2023-05-03 NOTE — Progress Notes (Signed)
[facility]   [phone] [phone]   Clinic Follow up Note   Patient Care Team: [name], MD as PCP - General (Family Medicine) Malachy Mood, MD as Consulting Physician (Oncology)  Date of Service:  05/03/2023  CHIEF COMPLAINT: f/u of Pancreatic Cancer   CURRENT THERAPY:  Pancreatic Abraxane D1,8,15 + Gemcitabine D1,8,15, q28d  ASSESSMENT:  Marie Ford is a 77 y.o. female with   Pancreatic cancer (HCC) stage IB (T2, N0, Mx), diagnosed in April 2024 -Presented to the emergency room on 4/17 with jaundice and dark urine -Baseline bilirubin elevated at 8.6 -CT scan showed pancreatic head lesion with involvement of the distal CBD and associated biliary ductal dilation.  No evidence of lymphadenopathy or metastatic disease to the liver -Had ERCP and stent placement on 4/19 -Underwent EUS by Dr. Meridee Score on 04/08/23 showing 30 x 23 mm pancreatic head lesion without any vascular involvement.  -The final pathology showed pancreatic adenocarcinoma -She has met pancreatobiliary surgeon Dr. Donell Beers last week, and she declined the surgery.  Patient understands that her cancer is not curable without surgery. -We discussed the natural course of early stage pancreatic cancer, and the benefit of treatment to prolong her life and improve her quality life. -I encouraged her to consider chemotherapy for 3 to 6 months, with consolidation radiation -I also discussed option of radiation alone, due to her concern of potential side effect from chemotherapy. -We also discussed option of palliative care and hospice, if she decided not to do any cancer treatment, or when she has significant cancer progression down the road. -Patient had her daughter on the phone, and the 2 friends with her during her visit today, still had hard time to decide what to do.  She seemed to have short-term memory loss, I also discussed chemo induced cognitive declining. -After lengthy  discussion, patient is agreeable to proceed with chemo class, then I will call her back after that to see if she has made any decision. -She does have a family trip in mid June, I strongly encouraged her to participate.  If she decides to try chemotherapy, I will likely give her gemcitabine alone for first cycle, or wait until she returns from trip to start chemotherapy.   PLAN: -Patient has decided not to pursue surgery.  We discussed option of chemotherapy and radiation, or supportive care alone. -I recommend chemotherapy with gemcitabine and Abraxane, may start with gemcitabine alone first - I request Chemo education next week, and a phone visit after that to finalize her treatment plan.   SUMMARY OF ONCOLOGIC HISTORY: Oncology History Overview Note   Cancer Staging  Pancreatic cancer [facility]) Staging form: Exocrine Pancreas, AJCC 8th Edition - Clinical: Stage IB (cT2, cN0, cM0) - Signed by Heilingoetter, Cassandra L, PA-C on 04/12/2023 Total positive nodes: 0     Pancreatic cancer (HCC)  03/27/2023 Imaging   CT ABDOMEN PELVIS W CONTRAST   IMPRESSION: Low-attenuation mass centered along the pancreatic head with involvement of the distal common duct with the associated biliary duct ductal dilatation and pancreatic atrophy. Changes are worrisome for neoplasm either a pancreatic neoplasm or cholangiocarcinoma. Recommend further evaluation.   No liver metastases. No pathologically enlarged upper abdominal nodes.   Diffuse colonic stool.  Normal appendix.  Few diverticula.   Portions of the pelvis are obscured by the streak artifact from the bilateral hip arthroplasties.   03/29/2023 Imaging   DG ERCP   IMPRESSION: Intraoperative fluoroscopic images show dilated intra or extrahepatic bile  ducts with high-grade stenosis at the level of the distal CBD. Biliary tree is decompressed following placement of common bile duct stent.   04/08/2023 Pathology Results   CYTOLOGY - NON PAP   CASE: [id number]  PATIENT: Marie Ford  Non-Gynecological Cytology Report   FINAL MICROSCOPIC DIAGNOSIS:  A. PANCREAS, HEAD, FINE NEEDLE ASPIRATION:  - Malignant cells present (see Comment)      04/08/2023 Procedure   EUS by Dr. Meridee Score   EUS impression  - A mass was identified in the pancreatic head/ genu of the pancreas. Cytology results are pending. However, the endosonographic appearance is highly suspicious for adenocarcinoma. This was staged T2 N0 Mx by endosonographic criteria. The staging applies if malignancy is confirmed. Fine needle biopsy performed.  - There was dilation in the common bile duct and in the common hepatic duct which measured up to 8 mm.  - One stent was visualized endosonographically within the common bile duct.  - Hyperechoic material consistent with sludge was visualized endosonographically in the gallbladder.  - Two enlarged lymph nodes were visualized in the celiac region ( level 20) and porta hepatis region. Tissue has not been obtained. However, the endosonographic appearance is suggestive of benign inflammatory change based on the visualization endosonographically.   04/12/2023 Initial Diagnosis   Pancreatic cancer (HCC)   04/12/2023 Cancer Staging   Staging form: Exocrine Pancreas, AJCC 8th Edition - Clinical: Stage IB (cT2, cN0, cM0) - Signed by Heilingoetter, Cassandra L, PA-C on 04/12/2023 Total positive nodes: 0   04/12/2023 Tumor Marker   Patient's tumor was tested for the following markers: CA 19.9. Results of the tumor marker test revealed 512.   04/25/2023 Genetic Testing   Negative [facility] STAT Panel.  Report date is 04/25/2023.   The BRCAPlus gene panel offered by [facility] and includes sequencing and rearrangement analysis for the following 13 genes: ATM, BARD1, BRCA1, BRCA2, CDH1, CHEK2, NF1, PALB2, PTEN, RAD51C, RAD51D, STK11 and TP53.  Pan-cancer panel is pending.    05/10/2023 -  Chemotherapy   Patient is on Treatment Plan :  PANCREATIC Abraxane D1,8,15 + Gemcitabine D1,8,15 q28d        INTERVAL HISTORY:  Marie Ford is here for a follow up of Pancreatic Cancer. She was last seen by Cassie PA-C on 04/12/2023. She presents to the clinic accompanied by family friends and daughter on speaker phone. Pt state that she is constipated and had use stool softener Colace. Pt state she met with her PCP and they started her on MiraLax. Pt has some fatigue and she able to continue her daily ADL'S. Pt met with Dr. Donell Beers early during the week.      All other systems were reviewed with the patient and are negative.  MEDICAL HISTORY:  Past Medical History:  Diagnosis Date   Chronic kidney disease    stage 3    Chronic sphenoidal sinusitis    Degenerative arthritis of hip    requirde surgery 2009   Depression 2010   Diplopia 2011   in left lateral gaze    Diplopia    " my eyes dont track together, i have corrective prisms in my eye glasses"    Diverticulosis 2006   noted in a colonoscopy   Esophageal reflux 2013   Hypertension    IGT (impaired glucose tolerance) 2011   denies hx of diabetes    Insomnia 2009   Mixed hyperlipidemia 2000   Osteoarthritis    Osteopenia 2005   Other and unspecified hyperlipidemia  2000   Other specified disease of sebaceous glands    Palpitations 2009   secondary to PSVT   Parkinson disease    PNA (pneumonia) 2014   Psoriasis 2004   PSVT (paroxysmal supraventricular tachycardia) 2009   "report it has been some time since i had that "    Situational stress    Venous insufficiency since 2017    SURGICAL HISTORY: Past Surgical History:  Procedure Laterality Date   BILIARY BRUSHING  03/29/2023   Procedure: BILIARY BRUSHING;  Surgeon: Hilarie Fredrickson, MD;  Location: [facility] ENDOSCOPY;  Service: Gastroenterology;;   BILIARY STENT PLACEMENT  03/29/2023   Procedure: BILIARY STENT PLACEMENT;  Surgeon: Hilarie Fredrickson, MD;  Location: [facility] ENDOSCOPY;  Service: Gastroenterology;;   BIOPSY   04/08/2023   Procedure: BIOPSY;  Surgeon: Lemar Lofty., MD;  Location: [facility] ENDOSCOPY;  Service: Gastroenterology;;   CATARACT EXTRACTION W/ INTRAOCULAR LENS  IMPLANT, BILATERAL     COLONOSCOPY  01/30/2010   Jarold Motto   ERCP N/A 03/29/2023   Procedure: ENDOSCOPIC RETROGRADE CHOLANGIOPANCREATOGRAPHY (ERCP);  Surgeon: Hilarie Fredrickson, MD;  Location: [facility] ENDOSCOPY;  Service: Gastroenterology;  Laterality: N/A;   ESOPHAGOGASTRODUODENOSCOPY (EGD) WITH PROPOFOL N/A 04/08/2023   Procedure: ESOPHAGOGASTRODUODENOSCOPY (EGD) WITH PROPOFOL;  Surgeon: Meridee Score Netty Starring., MD;  Location: [facility] ENDOSCOPY;  Service: Gastroenterology;  Laterality: N/A;   ETHMOIDECTOMY Left 11/17/2020   Procedure: LEFT SIDED TOTAL ETHMOIDECTOMY;  Surgeon: Drema Halon, MD;  Location: [facility];  Service: ENT;  Laterality: Left;   EUS N/A 04/08/2023   Procedure: UPPER ENDOSCOPIC ULTRASOUND (EUS) LINEAR;  Surgeon: Lemar Lofty., MD;  Location: [facility] ENDOSCOPY;  Service: Gastroenterology;  Laterality: N/A;   EYE SURGERY     FINE NEEDLE ASPIRATION N/A 04/08/2023   Procedure: FINE NEEDLE ASPIRATION (FNA) LINEAR;  Surgeon: Lemar Lofty., MD;  Location: [facility] ENDOSCOPY;  Service: Gastroenterology;  Laterality: N/A;   HEMORRHOID SURGERY     JOINT REPLACEMENT     LASIK     left medial and superior rectus muscle recesion for diplopia correction     SINUS ENDO WITH FUSION Left 11/17/2020   Procedure: SINUS ENDOSCOPY WITH FUSION NAVIGATION;  Surgeon: Drema Halon, MD;  Location: [facility];  Service: ENT;  Laterality: Left;   SPHENOIDECTOMY Left 11/17/2020   Procedure: LEFT SIDED SPHENOIDECTOMY WITH TISSUE REMOVAL;  Surgeon: Drema Halon, MD;  Location: [facility];  Service: ENT;  Laterality: Left;   SPHINCTEROTOMY  03/29/2023   Procedure: SPHINCTEROTOMY;  Surgeon: Hilarie Fredrickson, MD;  Location: [facility] ENDOSCOPY;  Service: Gastroenterology;;   surgery of  right eye rectus muscles for diplpia correction   2019   TOTAL HIP ARTHROPLASTY Right 2010   TOTAL HIP ARTHROPLASTY Left 08/19/2019   Procedure: TOTAL HIP ARTHROPLASTY ANTERIOR APPROACH;  Surgeon: Ollen Gross, MD;  Location: [facility] ORS;  Service: Orthopedics;  Laterality: Left;    TURBINATE REDUCTION Left 11/17/2020   Procedure: LEFT SIDED TURBINATE REDUCTION;  Surgeon: Drema Halon, MD;  Location: [facility];  Service: ENT;  Laterality: Left;   URETHRAL SLING     midurethral sling with TVT Exact and Cystoscopy    I have reviewed the social history and family history with the patient and they are unchanged from previous note.  ALLERGIES:  is allergic to erythromycin, macrobid [nitrofurantoin], keflex [cephalexin], levofloxacin, penicillins, and sulfamethoxazole-trimethoprim.  MEDICATIONS:  Current Outpatient Medications  Medication Sig Dispense Refill   polyethylene glycol (MIRALAX / GLYCOLAX) 17  g packet Take 17 g by mouth daily.     promethazine (PHENERGAN) 25 MG tablet Take 25 mg by mouth every 6 (six) hours as needed for nausea or vomiting.     ALPRAZolam (XANAX) 0.5 MG tablet Take 0.25 mg by mouth as needed for anxiety. Takes as needed for sleep     AUVELITY 45-105 MG TBCR Take 1 tablet by mouth every evening.     buPROPion (WELLBUTRIN XL) 150 MG 24 hr tablet Take 150 mg by mouth daily.     Calcium Carbonate Antacid (CALCIUM CARBONATE PO) Take by mouth.     carbidopa-levodopa (SINEMET IR) 25-100 MG tablet Take 1 tablet by mouth every 4 (four) hours as needed. (Patient taking differently: Take 1 tablet by mouth every 4 (four) hours.) 180 tablet 11   colestipol (COLESTID) 1 g tablet Take 2 g by mouth 2 (two) times daily. (Patient not taking: Reported on 04/12/2023)     ezetimibe (ZETIA) 10 MG tablet Take 10 mg by mouth daily.     losartan (COZAAR) 50 MG tablet Take 1 tablet (50 mg total) by mouth daily. 90 tablet 1   Multiple Vitamin (MULTIVITAMIN WITH MINERALS)  TABS tablet Take 1 tablet by mouth daily. Women's Multivitamin     OVER THE COUNTER MEDICATION Take 1 tablet by mouth daily as needed (constipation).     pantoprazole (PROTONIX) 40 MG tablet Take 40 mg by mouth daily.     Polyethyl Glycol-Propyl Glycol (SYSTANE) 0.4-0.3 % SOLN Place 1 drop into both eyes 3 (three) times daily as needed (dry/irritated eyes.).     pravastatin (PRAVACHOL) 80 MG tablet Take 1 tablet (80 mg total) by mouth every evening.     triamcinolone (NASACORT) 55 MCG/ACT AERO nasal inhaler Place 2 sprays into the nose daily. Use at night (Patient taking differently: Place 1 spray into the nose daily. Use at night) 1 each 12   No current facility-administered medications for this visit.    PHYSICAL EXAMINATION: ECOG PERFORMANCE STATUS: 1 - Symptomatic but completely ambulatory  Vitals:   05/03/23 0958  BP: (!) 154/81  Pulse: 91  Resp: 18  Temp: 98.3 F (36.8 C)  SpO2: 96%   Wt Readings from Last 3 Encounters:  05/03/23 143 lb 11.2 oz (65.2 kg)  04/22/23 143 lb 8 oz (65.1 kg)  04/12/23 142 lb 9.6 oz (64.7 kg)     GENERAL:alert, no distress and comfortable SKIN: skin color normal, no rashes or significant lesions EYES: normal, Conjunctiva are pink and non-injected, sclera clear  NEURO: alert & oriented x 3 with fluent speech    LABORATORY DATA:  I have reviewed the data as listed    Latest Ref Rng & Units 04/12/2023    1:52 PM 03/30/2023    3:32 AM 03/29/2023    3:10 AM  CBC  WBC 4.0 - 10.5 K/uL 8.0  6.6  6.2   Hemoglobin 12.0 - 15.0 g/dL 40.9  81.1  91.4   Hematocrit 36.0 - 46.0 % 41.0  35.1  35.0   Platelets 150 - 400 K/uL 396  335  327         Latest Ref Rng & Units 04/12/2023    1:52 PM 04/05/2023    9:58 AM 03/30/2023    3:32 AM  CMP  Glucose 70 - 99 mg/dL 782   956   BUN 8 - 23 mg/dL 25   14   Creatinine 2.13 - 1.00 mg/dL 0.86   5.78   Sodium 469 -  145 mmol/L 141   135   Potassium 3.5 - 5.1 mmol/L 4.0   3.7   Chloride 98 - 111 mmol/L 106   104    CO2 22 - 32 mmol/L 30   23   Calcium 8.9 - 10.3 mg/dL 9.4   8.6   Total Protein 6.5 - 8.1 g/dL 7.2  6.7  5.5   Total Bilirubin 0.3 - 1.2 mg/dL 1.5  1.7  4.5   Alkaline Phos 38 - 126 U/L 137  149  155   AST 15 - 41 U/L 33  32  143   ALT 0 - 44 U/L 22  26  114       RADIOGRAPHIC STUDIES: I have personally reviewed the radiological images as listed and agreed with the findings in the report. No results found.    No orders of the defined types were placed in this encounter.  All questions were answered. The patient knows to call the clinic with any problems, questions or concerns. No barriers to learning was detected. The total time spent in the appointment was 50 minutes.     Malachy Mood, MD 05/03/2023   Carolin Coy, CMA, am acting as scribe for Malachy Mood, MD.   I have reviewed the above documentation for accuracy and completeness, and I agree with the above.

## 2023-05-03 NOTE — Progress Notes (Signed)
START ON PATHWAY REGIMEN - Pancreatic Adenocarcinoma     A cycle is every 28 days:     Nab-paclitaxel (protein bound)      Gemcitabine   **Always confirm dose/schedule in your pharmacy ordering system**  Patient Characteristics: Preoperative, M0 (Clinical Staging), Resectable, Neoadjuvant Therapy Planned, BRCA1/2 and PALB2 Mutation Absent/Unknown Therapeutic Status: Preoperative, M0 (Clinical Staging) AJCC T Category: cT2 AJCC N Category: cN0 Resectability Status: Resectable AJCC M Category: cM0 AJCC 8 Stage Grouping: IB BRCA1/2 Mutation Status: Absent PALB2 Mutation Status: Absent Intent of Therapy: Non-Curative / Palliative Intent, Discussed with Patient

## 2023-05-03 NOTE — Assessment & Plan Note (Signed)
stage IB (T2, N0, Mx), diagnosed in April 2024 -Presented to the emergency room on 4/17 with jaundice and dark urine -Baseline bilirubin elevated at 8.6 -CT scan showed pancreatic head lesion with involvement of the distal CBD and associated biliary ductal dilation.  No evidence of lymphadenopathy or metastatic disease to the liver -Had ERCP and stent placement on 4/19 -Underwent EUS by Dr. Meridee Score on 04/08/23 showing 30 x 23 mm pancreatic head lesion without any vascular involvement.  -The final pathology showed pancreatic adenocarcinoma -She has met pancreatobiliary surgeon Dr. Donell Beers last week, and she declined the surgery.  Patient understands that her cancer is not curable without surgery. -I encouraged her to consider chemotherapy for 3 to 6 months, with consolidation radiation -

## 2023-05-04 ENCOUNTER — Other Ambulatory Visit: Payer: Self-pay

## 2023-05-07 ENCOUNTER — Encounter: Payer: Self-pay | Admitting: Hematology

## 2023-05-07 ENCOUNTER — Other Ambulatory Visit: Payer: Self-pay | Admitting: Hematology

## 2023-05-07 DIAGNOSIS — C250 Malignant neoplasm of head of pancreas: Secondary | ICD-10-CM

## 2023-05-08 ENCOUNTER — Ambulatory Visit: Payer: Self-pay | Admitting: Genetic Counselor

## 2023-05-08 ENCOUNTER — Telehealth: Payer: Self-pay | Admitting: Genetic Counselor

## 2023-05-08 ENCOUNTER — Other Ambulatory Visit: Payer: Self-pay

## 2023-05-08 DIAGNOSIS — C250 Malignant neoplasm of head of pancreas: Secondary | ICD-10-CM

## 2023-05-08 DIAGNOSIS — Z1379 Encounter for other screening for genetic and chromosomal anomalies: Secondary | ICD-10-CM

## 2023-05-08 NOTE — Telephone Encounter (Signed)
Called patient to discuss genetics results, LVM requesting call back.    Called daughter to discuss results.  Disclosed no mutations associated with pancreatic cancer.   Disclosed MUTYH het result.  Discussed no known increased cancer risks for those with single heterozygous mutation in MUTYH.  No management changes.  Discussed family implications.  Daughter expressed understanding and said it is OK to cancel upcoming genetics appt if OK with mother.

## 2023-05-08 NOTE — Progress Notes (Signed)
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 Since Ms. Mallek has only one pathogenic mutation in MUTYH, she is NOT affected with MUTYH-associated polyposis, but instead is a carrier.    No other pathogenic variants were identified in the [facility] CancerNext-Expanded +RNAinsight Panel.  Report date is May 03, 2023.    The CancerNext-Expanded gene panel offered by [facility] and includes sequencing, rearrangement, and RNA analysis for the following 71 genes:  AIP, ALK, APC, ATM, BAP1, BARD1, BMPR1A, BRCA1, BRCA2, BRIP1, CDC73, CDH1, CDK4, CDKN1B, CDKN2A, CHEK2, DICER1, FH, FLCN, KIF1B, LZTR1,MAX, MEN1, MET, MLH1, MSH2, MSH6, MUTYH, NF1, NF2, NTHL1, PALB2, PHOX2B, PMS2, POT1, PRKAR1A, PTCH1, PTEN, RAD51C,RAD51D, RB1, RET, SDHA, SDHAF2, SDHB, SDHC, SDHD, SMAD4, SMARCA4, SMARCB1, SMARCE1, STK11, SUFU, TMEM127, TP53,TSC1, TSC2 and VHL (sequencing and deletion/duplication); AXIN2, CTNNA1, EGFR, EGLN1, HOXB13, KIT, MITF, MSH3, PDGFRA, POLD1 and POLE (sequencing only); EPCAM and GREM1 (deletion/duplication only).      A copy of the test report has been scanned into Epic and is located under the Molecular Pathology section of the Results Review tab.      Of note, this result does not explain the personal history of pancreatic cancer.  Even though a pathogenic variant related to breast cancer was not identified, possible explanations for the cancer in the family may include: The cancers in Ms. Tapley and/or her family may not be hereditary but rather sporadic/familial or due to other genetic and environmental factors. There may be a gene mutation in one of these genes that current testing methods cannot detect but that chance is small. There could be another gene that has not yet been discovered, or that we have not yet tested, that is responsible for the cancer diagnoses in the family.  It is also possible there is a  hereditary cause for the cancer in the family that Ms. Potts did not inherit.   Therefore, it is important to remain in touch with cancer genetics in the future so that we can continue to offer Ms. Avis the most up to date genetic testing.      MUTYH SCREENING RECOMMENDATIONS:   Biallelic (two) mutations in the MUTYH gene are associated with MUTYH-associated Polyposis syndrome (MAP). MAP is an autosomal recessive condition in which patients have high risks for colorectal cancer due to a large number of adenomatous polyps in the gastrointestinal system.   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 It is estimated that 1% to 2% of the population has a mutation in one copy of the MUTYH gene. These individuals do not have MAP.  There is currently no known increased risk for cancer in individuals who are carriers for MAP.  No management changes are recommended based on this result alone.    Carriers of MAP should continue to follow colon cancer screening guidelines based on their personal and family history.   First degree relatives of those with colon cancer should receive colonoscopies beginning at age 24, or 10 years prior to the earliest diagnosis of colon cancer in the family, and receive colonoscopies at least every 5 years, or as recommended by their gastroenterologist.        FAMILY MEMBERS:   Ms. Flanery's first degree relatives are at a 50% risk for also having inherited the MUTYH mutation. Family members may consider genetic testing for this familial pathogenic variant. They may contact our office at [phone] for more information or to schedule an  appointment.  Complimentary testing for the familial variant through [employer] is available for 90 days from the report date.  Family members who live outside of the area are encouraged to find a genetic counselor in their area by visiting: [email or web address].    Individuals in this family  might be at some increased risk of developing cancer, over the general population risk, due to the family history of breast cancer.  Individuals in the family should notify their providers of the family history of cancer. We recommend women in this family have a yearly mammogram beginning at age 61, or 70 years younger than the earliest onset of cancer, an annual clinical breast exam, and perform monthly breast self-exams.    First degree relatives of those with colon cancer should receive colonoscopies beginning at age 45, or 10 years prior to the earliest diagnosis of colon cancer in the family, and receive colonoscopies at least every 5 years, or as recommended by their gastroenterologist.    Ms. Andel's family should remain in contact with cancer genetics so we can update Ms. Lierman's personal and family histories, and inform them of advances in cancer genetics that may be of benefit for the entire family. Ms. Sprauer daughter knows she is also welcome to call with any questions or concerns, at any time.    Kuron Docken M. Rennie Plowman, MS, [facility] Genetic Counselor [email or web address] (P) [phone]

## 2023-05-09 ENCOUNTER — Other Ambulatory Visit: Payer: Self-pay

## 2023-05-09 ENCOUNTER — Inpatient Hospital Stay: Payer: Medicare PPO

## 2023-05-09 DIAGNOSIS — C250 Malignant neoplasm of head of pancreas: Secondary | ICD-10-CM

## 2023-05-09 MED ORDER — LIDOCAINE-PRILOCAINE 2.5-2.5 % EX CREA
TOPICAL_CREAM | CUTANEOUS | 3 refills | Status: DC
Start: 2023-05-09 — End: 2024-01-06

## 2023-05-09 MED ORDER — PROCHLORPERAZINE MALEATE 10 MG PO TABS
10.0000 mg | ORAL_TABLET | Freq: Four times a day (QID) | ORAL | 1 refills | Status: DC | PRN
Start: 2023-05-09 — End: 2024-01-06

## 2023-05-09 MED ORDER — ONDANSETRON HCL 8 MG PO TABS
8.0000 mg | ORAL_TABLET | Freq: Three times a day (TID) | ORAL | 1 refills | Status: DC | PRN
Start: 2023-05-09 — End: 2023-05-09

## 2023-05-09 NOTE — Telephone Encounter (Signed)
Disclosed genetic testing results to [facility].  Okay to cancel genetics appt per Bonita Quin.

## 2023-05-10 ENCOUNTER — Other Ambulatory Visit: Payer: Self-pay

## 2023-05-10 DIAGNOSIS — C250 Malignant neoplasm of head of pancreas: Secondary | ICD-10-CM

## 2023-05-13 ENCOUNTER — Encounter: Payer: Self-pay | Admitting: Neurology

## 2023-05-14 NOTE — Telephone Encounter (Signed)
I reached out to the patient about coming Friday for an appt. I am waiting on her response. Unfortunately I didn't see Ms Marie Ford on pt's DPR for our office so we cannot discuss patient's medical care with her. I let the patient know that too. We would need a new DPR.

## 2023-05-16 ENCOUNTER — Other Ambulatory Visit: Payer: Medicare PPO

## 2023-05-16 ENCOUNTER — Telehealth: Payer: Self-pay | Admitting: Hematology

## 2023-05-16 ENCOUNTER — Encounter: Payer: Medicare PPO | Admitting: Genetic Counselor

## 2023-05-16 ENCOUNTER — Telehealth: Payer: Self-pay | Admitting: Neurology

## 2023-05-16 ENCOUNTER — Other Ambulatory Visit: Payer: Self-pay | Admitting: Internal Medicine

## 2023-05-16 ENCOUNTER — Encounter: Payer: Self-pay | Admitting: Neurology

## 2023-05-16 DIAGNOSIS — C250 Malignant neoplasm of head of pancreas: Secondary | ICD-10-CM

## 2023-05-16 NOTE — Consult Note (Addendum)
Chief Complaint: Patient was seen in consultation today for Port-A-Cath placement  Referring Physician(s): Feng,Yan  Supervising Physician: Mir, Mauri Reading  Patient Status: [facility] - Out-pt  History of Present Illness: Marie Ford is a 77 y.o. female with past medical history of chronic kidney disease, depression, diverticulosis, esophageal reflux, hypertension, impaired glucose tolerance, hyperlipidemia, osteoarthritis, Parkinson's disease, psoriasis, venous insufficiency who presents now with newly diagnosed pancreatic cancer.  She is scheduled today for Port-A-Cath placement to assist with treatment.  She recently completed a 3-day course of fosfomycin for UTI.  Past Medical History:  Diagnosis Date   Chronic kidney disease    stage 3    Chronic sphenoidal sinusitis    Degenerative arthritis of hip    requirde surgery 2009   Depression 2010   Diplopia 2011   in left lateral gaze    Diplopia    " my eyes dont track together, i have corrective prisms in my eye glasses"    Diverticulosis 2006   noted in a colonoscopy   Esophageal reflux 2013   Hypertension    IGT (impaired glucose tolerance) 2011   denies hx of diabetes    Insomnia 2009   Mixed hyperlipidemia 2000   Osteoarthritis    Osteopenia 2005   Other and unspecified hyperlipidemia 2000   Other specified disease of sebaceous glands    Palpitations 2009   secondary to PSVT   Parkinson disease    PNA (pneumonia) 2014   Psoriasis 2004   PSVT (paroxysmal supraventricular tachycardia) 2009   "report it has been some time since i had that "    Situational stress    Venous insufficiency since 2017    Past Surgical History:  Procedure Laterality Date   BILIARY BRUSHING  03/29/2023   Procedure: BILIARY BRUSHING;  Surgeon: Hilarie Fredrickson, MD;  Location: [facility] ENDOSCOPY;  Service: Gastroenterology;;   BILIARY STENT PLACEMENT  03/29/2023   Procedure: BILIARY STENT PLACEMENT;  Surgeon: Hilarie Fredrickson, MD;  Location: [facility]  ENDOSCOPY;  Service: Gastroenterology;;   BIOPSY  04/08/2023   Procedure: BIOPSY;  Surgeon: Lemar Lofty., MD;  Location: [facility] ENDOSCOPY;  Service: Gastroenterology;;   CATARACT EXTRACTION W/ INTRAOCULAR LENS  IMPLANT, BILATERAL     COLONOSCOPY  01/30/2010   Jarold Motto   ERCP N/A 03/29/2023   Procedure: ENDOSCOPIC RETROGRADE CHOLANGIOPANCREATOGRAPHY (ERCP);  Surgeon: Hilarie Fredrickson, MD;  Location: [facility] ENDOSCOPY;  Service: Gastroenterology;  Laterality: N/A;   ESOPHAGOGASTRODUODENOSCOPY (EGD) WITH PROPOFOL N/A 04/08/2023   Procedure: ESOPHAGOGASTRODUODENOSCOPY (EGD) WITH PROPOFOL;  Surgeon: Meridee Score Netty Starring., MD;  Location: [facility] ENDOSCOPY;  Service: Gastroenterology;  Laterality: N/A;   ETHMOIDECTOMY Left 11/17/2020   Procedure: LEFT SIDED TOTAL ETHMOIDECTOMY;  Surgeon: Drema Halon, MD;  Location: [facility];  Service: ENT;  Laterality: Left;   EUS N/A 04/08/2023   Procedure: UPPER ENDOSCOPIC ULTRASOUND (EUS) LINEAR;  Surgeon: Lemar Lofty., MD;  Location: [facility] ENDOSCOPY;  Service: Gastroenterology;  Laterality: N/A;   EYE SURGERY     FINE NEEDLE ASPIRATION N/A 04/08/2023   Procedure: FINE NEEDLE ASPIRATION (FNA) LINEAR;  Surgeon: Lemar Lofty., MD;  Location: [facility] ENDOSCOPY;  Service: Gastroenterology;  Laterality: N/A;   HEMORRHOID SURGERY     JOINT REPLACEMENT     LASIK     left medial and superior rectus muscle recesion for diplopia correction     SINUS ENDO WITH FUSION Left 11/17/2020   Procedure: SINUS ENDOSCOPY WITH FUSION NAVIGATION;  Surgeon: Drema Halon, MD;  Location:  [facility];  Service: ENT;  Laterality: Left;   SPHENOIDECTOMY Left 11/17/2020   Procedure: LEFT SIDED SPHENOIDECTOMY WITH TISSUE REMOVAL;  Surgeon: Drema Halon, MD;  Location: [facility];  Service: ENT;  Laterality: Left;   SPHINCTEROTOMY  03/29/2023   Procedure: SPHINCTEROTOMY;  Surgeon: Hilarie Fredrickson, MD;  Location: [facility]  ENDOSCOPY;  Service: Gastroenterology;;   surgery of right eye rectus muscles for diplpia correction   2019   TOTAL HIP ARTHROPLASTY Right 2010   TOTAL HIP ARTHROPLASTY Left 08/19/2019   Procedure: TOTAL HIP ARTHROPLASTY ANTERIOR APPROACH;  Surgeon: Ollen Gross, MD;  Location: [facility] ORS;  Service: Orthopedics;  Laterality: Left;    TURBINATE REDUCTION Left 11/17/2020   Procedure: LEFT SIDED TURBINATE REDUCTION;  Surgeon: Drema Halon, MD;  Location: [facility];  Service: ENT;  Laterality: Left;   URETHRAL SLING     midurethral sling with TVT Exact and Cystoscopy    Allergies: Erythromycin, Macrobid [nitrofurantoin], Keflex [cephalexin], Levofloxacin, Penicillins, and Sulfamethoxazole-trimethoprim  Medications: Prior to Admission medications   Medication Sig Start Date End Date Taking? Authorizing Provider  ALPRAZolam Prudy Feeler) 0.5 MG tablet Take 0.25 mg by mouth as needed for anxiety. Takes as needed for sleep    [provider]  AUVELITY 45-105 MG TBCR Take 1 tablet by mouth every evening.    [provider]  buPROPion (WELLBUTRIN XL) 150 MG 24 hr tablet Take 150 mg by mouth daily.    [provider]  Calcium Carbonate Antacid (CALCIUM CARBONATE PO) Take by mouth.    [provider]  carbidopa-levodopa (SINEMET IR) 25-100 MG tablet Take 1 tablet by mouth every 4 (four) hours as needed. Patient taking differently: Take 1 tablet by mouth every 4 (four) hours. 10/17/22   Anson Fret, MD  colestipol (COLESTID) 1 g tablet Take 2 g by mouth 2 (two) times daily. Patient not taking: Reported on 04/12/2023 04/03/23   [provider]  ezetimibe (ZETIA) 10 MG tablet Take 10 mg by mouth daily. 02/25/19   [provider]  lidocaine-prilocaine (EMLA) cream Apply to affected area once 05/09/23   Malachy Mood, MD  losartan (COZAAR) 50 MG tablet Take 1 tablet (50 mg total) by mouth daily. 03/30/23 09/26/23  Almon Hercules, MD   Multiple Vitamin (MULTIVITAMIN WITH MINERALS) TABS tablet Take 1 tablet by mouth daily. Women's Multivitamin    [provider]  OVER THE COUNTER MEDICATION Take 1 tablet by mouth daily as needed (constipation).    [provider]  pantoprazole (PROTONIX) 40 MG tablet Take 40 mg by mouth daily. 03/21/23   [provider]  Polyethyl Glycol-Propyl Glycol (SYSTANE) 0.4-0.3 % SOLN Place 1 drop into both eyes 3 (three) times daily as needed (dry/irritated eyes.).    [provider]  polyethylene glycol (MIRALAX / GLYCOLAX) 17 g packet Take 17 g by mouth daily.    [provider]  pravastatin (PRAVACHOL) 80 MG tablet Take 1 tablet (80 mg total) by mouth every evening. 04/13/23   Almon Hercules, MD  prochlorperazine (COMPAZINE) 10 MG tablet Take 1 tablet (10 mg total) by mouth every 6 (six) hours as needed for nausea or vomiting. 05/09/23   Malachy Mood, MD  promethazine (PHENERGAN) 25 MG tablet Take 25 mg by mouth every 6 (six) hours as needed for nausea or vomiting.    [provider]  triamcinolone (NASACORT) 55 MCG/ACT AERO nasal inhaler Place 2 sprays into the nose daily. Use  at night Patient taking differently: Place 1 spray into the nose daily. Use at night 11/01/20   Drema Halon, MD     Family History  Problem Relation Age of Onset   Congestive Heart Failure Mother    CVA Father    Heart failure Father    Non-Hodgkin's lymphoma Sister    Alcohol abuse Sister    Liver cancer Brother    Colon cancer Brother 24   Colitis Brother    Hypertension Brother    Gout Brother    Melanoma Daughter    Parkinson's disease Cousin    Esophageal cancer Neg Hx    Rectal cancer Neg Hx    Stomach cancer Neg Hx    Colon polyps Neg Hx     Social History   Socioeconomic History   Marital status: Single    Spouse name: Not on file   Number of children: 1   Years of education: Not on file   Highest education level: Doctorate  Occupational  History   Occupation: retired  Tobacco Use   Smoking status: Never   Smokeless tobacco: Never  Vaping Use   Vaping Use: Never used  Substance and Sexual Activity   Alcohol use: Yes    Comment: rarely   Drug use: No   Sexual activity: Not Currently    Birth control/protection: Post-menopausal  Other Topics Concern   Not on file  Social History Narrative   Lives at [facility]   Right handed   Caffeine: rarely   Social Determinants of Health   Financial Resource Strain: Not on file  Food Insecurity: No Food Insecurity (03/29/2023)   Hunger Vital Sign    Worried About Running Out of Food in the Last Year: Never true    Ran Out of Food in the Last Year: Never true  Transportation Needs: No Transportation Needs (03/29/2023)   PRAPARE - Administrator, Civil Service (Medical): No    Lack of Transportation (Non-Medical): No  Physical Activity: Not on file  Stress: Not on file  Social Connections: Not on file     Review of Systems: denies fever,HA,CP,dyspnea, cough, abd pain, N/V or bleeding; she does have back pain, improving dysuria  Vital Signs: Vitals:   05/17/23 1300  BP: (!) 165/81  Pulse: 81  Resp: 18  Temp: 98.5 F (36.9 C)      Code Status: FULL CODE  Advance Care Plan: no documents on file    Physical Exam  Imaging: CT CHEST W CONTRAST  Result Date: 04/24/2023 CLINICAL DATA:  Pancreatic cancer, staging.  * Tracking Code: BO * EXAM: CT CHEST WITH CONTRAST TECHNIQUE: Multidetector CT imaging of the chest was performed during intravenous contrast administration. RADIATION DOSE REDUCTION: This exam was performed according to the departmental dose-optimization program which includes automated exposure control, adjustment of the mA and/or kV according to patient size and/or use of iterative reconstruction technique. CONTRAST:  80mL OMNIPAQUE IOHEXOL 300 MG/ML  SOLN COMPARISON:  CT abdomen pelvis 03/27/2023. FINDINGS: Cardiovascular:  Heart is at the upper limits of normal in size. Small amount of pericardial fluid may be physiologic. Atherosclerotic calcification of the aorta. Mediastinum/Nodes: No pathologically enlarged mediastinal, hilar or axillary lymph nodes. Esophagus is grossly unremarkable. Lungs/Pleura: Mild scattered subsegmental volume loss. No pleural fluid. Airway is unremarkable. Upper Abdomen: Liver is decreased in attenuation. No ileo with common bile duct stent in place. Mild edema along the gallbladder. A mass in the pancreatic neck measures roughly 1.4  x 2.9 cm with atrophy and ductal dilatation in the body and tail of the pancreas. Spleen, adrenal glands, kidneys, stomach and bowel are grossly unremarkable. Musculoskeletal: Degenerative changes in the spine.  Osteopenia. IMPRESSION: 1. No evidence of metastatic disease. 2. Pancreatic adenocarcinoma, more thoroughly evaluated on 03/27/2023. 3. Common bile duct stent in place with expected pneumobilia. Mild gallbladder edema. 4. Hepatic steatosis. 5.  Aortic atherosclerosis (ICD10-I70.0). Electronically Signed   By: Leanna Battles M.D.   On: 04/24/2023 11:57    Labs:  CBC: Recent Labs    03/28/23 0244 03/29/23 0310 03/30/23 0332 04/12/23 1352  WBC 7.3 6.2 6.6 8.0  HGB 14.6 12.3 12.1 14.2  HCT 41.1 35.0* 35.1* 41.0  PLT 412* 327 335 396    COAGS: Recent Labs    03/27/23 1845 03/28/23 0244  INR 0.9 1.0    BMP: Recent Labs    03/28/23 0244 03/29/23 0310 03/30/23 0332 04/12/23 1352  NA 132* 135 135 141  K 3.5 3.3* 3.7 4.0  CL 95* 99 104 106  CO2 26 26 23 30   GLUCOSE 99 116* 125* 104*  BUN 18 15 14  25*  CALCIUM 9.5 8.9 8.6* 9.4  CREATININE 1.19* 1.26* 0.99 1.10*  GFRNONAA 47* 44* 58* 51*    LIVER FUNCTION TESTS: Recent Labs    03/29/23 0310 03/30/23 0332 04/05/23 0958 04/12/23 1352  BILITOT 6.7* 4.5* 1.7* 1.5*  AST 176* 143* 32 33  ALT 87* 114* 26 22  ALKPHOS 173* 155* 149* 137*  PROT 5.6* 5.5* 6.7 7.2  ALBUMIN 2.9* 2.7* 3.7  4.0    TUMOR MARKERS: No results for input(s): "AFPTM", "CEA", "CA199", "CHROMGRNA" in the last 8760 hours.  Assessment and Plan: 77 y.o. female with past medical history of chronic kidney disease, depression, diverticulosis, esophageal reflux, hypertension, impaired glucose tolerance, hyperlipidemia, osteoarthritis, Parkinson's disease, psoriasis, venous insufficiency who presents now with newly diagnosed pancreatic cancer.  She is scheduled today for Port-A-Cath placement to assist with treatment. She recently completed a 3-day course of fosfomycin for UTI- will administer IV Ancef preprocedure today (d/w [facility] pharmacist- Kenard Gower).Risks and benefits of image guided port-a-catheter placement was discussed with the patient including, but not limited to bleeding, infection, pneumothorax, or fibrin sheath development and need for additional procedures.  All of the patient's questions were answered, patient is agreeable to proceed. Consent signed and in chart.    Thank you for this interesting consult.  I greatly enjoyed meeting LINDSAY FREVERT and look forward to participating in their care.  A copy of this report was sent to the requesting provider on this date.  Electronically Signed: D. Jeananne Rama, PA-C 05/16/2023, 1:49 PM   I spent a total of  25 minutes   in face to face in clinical consultation, greater than 50% of which was counseling/coordinating care for Port-A-Cath placement

## 2023-05-16 NOTE — Telephone Encounter (Signed)
Marie Ford/Marie Ford: WOuld you please move patient's appointment up to June 20 at 11:30 am please? And then call her to let her know it is confirmed. Thank you!  Pod 4 FYI

## 2023-05-16 NOTE — Telephone Encounter (Signed)
Scheduled appointment per scheduling message. Patient is aware of the made appointment. 

## 2023-05-17 ENCOUNTER — Encounter (HOSPITAL_COMMUNITY): Payer: Self-pay

## 2023-05-17 ENCOUNTER — Ambulatory Visit (HOSPITAL_COMMUNITY)
Admission: RE | Admit: 2023-05-17 | Discharge: 2023-05-17 | Disposition: A | Discharge status: Home / Self Care | Payer: Medicare PPO | Source: Ambulatory Visit | Attending: Hematology | Admitting: Hematology

## 2023-05-17 DIAGNOSIS — G20A1 Parkinson's disease without dyskinesia, without mention of fluctuations: Secondary | ICD-10-CM | POA: Insufficient documentation

## 2023-05-17 DIAGNOSIS — F32A Depression, unspecified: Secondary | ICD-10-CM | POA: Diagnosis not present

## 2023-05-17 DIAGNOSIS — C250 Malignant neoplasm of head of pancreas: Secondary | ICD-10-CM | POA: Insufficient documentation

## 2023-05-17 DIAGNOSIS — E785 Hyperlipidemia, unspecified: Secondary | ICD-10-CM | POA: Insufficient documentation

## 2023-05-17 DIAGNOSIS — N183 Chronic kidney disease, stage 3 unspecified: Secondary | ICD-10-CM | POA: Insufficient documentation

## 2023-05-17 DIAGNOSIS — K219 Gastro-esophageal reflux disease without esophagitis: Secondary | ICD-10-CM | POA: Insufficient documentation

## 2023-05-17 DIAGNOSIS — I110 Hypertensive heart disease with heart failure: Secondary | ICD-10-CM | POA: Insufficient documentation

## 2023-05-17 HISTORY — DX: Urinary tract infection, site not specified: N39.0

## 2023-05-17 HISTORY — PX: IR IMAGING GUIDED PORT INSERTION: IMG5740

## 2023-05-17 HISTORY — DX: Other specified health status: Z78.9

## 2023-05-17 MED ORDER — MIDAZOLAM HCL 2 MG/2ML IJ SOLN
INTRAMUSCULAR | Status: AC | PRN
  Administered 2023-05-17 (×2): 1 mg via INTRAVENOUS

## 2023-05-17 MED ORDER — LIDOCAINE-EPINEPHRINE 1 %-1:100000 IJ SOLN
INTRAMUSCULAR | Status: AC
  Filled 2023-05-17: qty 1

## 2023-05-17 MED ORDER — FENTANYL CITRATE (PF) 100 MCG/2ML IJ SOLN
INTRAMUSCULAR | Status: AC | PRN
  Administered 2023-05-17 (×2): 50 ug via INTRAVENOUS

## 2023-05-17 MED ORDER — HEPARIN SOD (PORK) LOCK FLUSH 100 UNIT/ML IV SOLN
INTRAVENOUS | Status: AC
  Filled 2023-05-17: qty 5

## 2023-05-17 MED ORDER — HEPARIN SOD (PORK) LOCK FLUSH 100 UNIT/ML IV SOLN
500.0000 [IU] | Freq: Once | INTRAVENOUS | Status: AC
  Administered 2023-05-17: 500 [IU] via INTRAVENOUS

## 2023-05-17 MED ORDER — FENTANYL CITRATE (PF) 100 MCG/2ML IJ SOLN
INTRAMUSCULAR | Status: AC
  Filled 2023-05-17: qty 4

## 2023-05-17 MED ORDER — MIDAZOLAM HCL 2 MG/2ML IJ SOLN
INTRAMUSCULAR | Status: AC
  Filled 2023-05-17: qty 4

## 2023-05-17 MED ORDER — LIDOCAINE-EPINEPHRINE 1 %-1:100000 IJ SOLN: 20.0000 mL | Freq: Once | INTRAMUSCULAR | Status: DC

## 2023-05-17 MED ORDER — CEFAZOLIN SODIUM-DEXTROSE 2-4 GM/100ML-% IV SOLN
2.0000 g | INTRAVENOUS | Status: AC
  Administered 2023-05-17: 2 g via INTRAVENOUS
  Filled 2023-05-17: qty 100

## 2023-05-17 MED ORDER — SODIUM CHLORIDE 0.9 % IV SOLN: INTRAVENOUS | Status: DC

## 2023-05-17 NOTE — Telephone Encounter (Signed)
I went ahead and scheduled her

## 2023-05-17 NOTE — Procedures (Signed)
Interventional Radiology Procedure Note  Procedure: Chest Port  Indication: Pancreatic Ca  Findings: Please refer to procedural dictation for full description.  Complications: None  EBL: < 10 mL  Acquanetta Belling, MD [phone]

## 2023-05-17 NOTE — Discharge Instructions (Signed)
Implanted Port Insertion, Care After  The following information offers guidance on how to care for yourself after your procedure. Your health care provider may also give you more specific instructions. If you have problems or questions, contact your health care provider.  What can I expect after the procedure? After the procedure, it is common to have: Discomfort at the port insertion site. Bruising on the skin over the port. This should improve over 3-4 days.   Urgent needs - Interventional Radiology, clinic [phone] (mon-fri 8-5).   Wound - May remove dressing and shower in 24 to 48 hours.  Keep site clean and dry.  Replace with bandaid as needed.  Do not submerge in tub or water until site healing well. If closed with glue, glue will flake off on its own.   If ordered by your provider, may start Emla cream (or any other creams ointments or lotions) in 2 weeks or after incision is healed. Port is ready for use immediately.   After completion of treatment, your provider should have you set up for monthly port flushes.   Follow these instructions at home: Port care After your port is placed, you will get a manufacturer's information card. The card has information about your port. Keep this card with you at all times. Take care of the port as told by your health care provider. Ask your health care provider if you or a family member can get training for taking care of the port at home. A home health care nurse will be be available to help care for the port. Make sure to remember what type of port you have. Incision care     Follow instructions from your health care provider about how to take care of your port insertion site. Make sure you: Wash your hands with soap and water for at least 20 seconds before and after you change your bandage (dressing). If soap and water are not available, use hand sanitizer. Change your dressing as told by your health care provider. Leave stitches  (sutures), skin glue, or adhesive strips in place. These skin closures may need to stay in place for 2 weeks or longer. If adhesive strip edges start to loosen and curl up, you may trim the loose edges. Do not remove adhesive strips completely unless your health care provider tells you to do that. Check your port insertion site every day for signs of infection. Check for: Redness, swelling, or pain. Fluid or blood. Warmth. Pus or a bad smell. Activity Return to your normal activities as told by your health care provider. Ask your health care provider what activities are safe for you. You may have to avoid lifting. Ask your health care provider how much you can safely lift. General instructions Take over-the-counter and prescription medicines only as told by your health care provider. Do not take baths, swim, or use a hot tub until your health care provider approves. Ask your health care provider if you may take showers. You may only be allowed to take sponge baths. If you were given a sedative during the procedure, it can affect you for several hours. Do not drive or operate machinery until your health care provider says that it is safe. Wear a medical alert bracelet in case of an emergency. This will tell any health care providers that you have a port. Keep all follow-up visits. This is important. Contact a health care provider if: You cannot flush your port with saline as directed, or you cannot   draw blood from the port. You have a fever or chills. You have redness, swelling, or pain around your port insertion site. You have fluid or blood coming from your port insertion site. Your port insertion site feels warm to the touch. You have pus or a bad smell coming from the port insertion site. Get help right away if: You have chest pain or shortness of breath. You have bleeding from your port that you cannot control. These symptoms may be an emergency. Get help right away. Call 911. Do not  wait to see if the symptoms will go away. Do not drive yourself to the hospital. Summary Take care of the port as told by your health care provider. Keep the manufacturer's information card with you at all times. Change your dressing as told by your health care provider. Contact a health care provider if you have a fever or chills or if you have redness, swelling, or pain around your port insertion site. Keep all follow-up visits. This information is not intended to replace advice given to you by your health care provider. Make sure you discuss any questions you have with your health care provider. Document Revised: 05/30/2021 Document Reviewed: 05/30/2021 Elsevier Patient Education  2023 Elsevier Inc.    Moderate Conscious Sedation  Adult  Care After (English)  After the procedure, it is common to have: Sleepiness for a few hours. Impaired judgment for a few hours. Trouble with balance. Nausea or vomiting if you eat too soon. Follow these instructions at home: For the time period you were told by your health care provider:  Rest. Do not participate in activities where you could fall or become injured. Do not drive or use machinery. Do not drink alcohol. Do not take sleeping pills or medicines that cause drowsiness. Do not make important decisions or sign legal documents. Do not take care of children on your own. Eating and drinking Follow instructions from your health care provider about what you may eat and drink. Drink enough fluid to keep your urine pale yellow. If you vomit: Drink clear fluids slowly and in small amounts as you are able. Clear fluids include water, ice chips, low-calorie sports drinks, and fruit juice that has water added to it (diluted fruit juice). Eat light and bland foods in small amounts as you are able. These foods include bananas, applesauce, rice, lean meats, toast, and crackers. General instructions Take over-the-counter and prescription medicines  only as told by your health care provider. Have a responsible adult stay with you for the time you are told. Do not use any products that contain nicotine or tobacco. These products include cigarettes, chewing tobacco, and vaping devices, such as e-cigarettes. If you need help quitting, ask your health care provider. Return to your normal activities as told by your health care provider. Ask your health care provider what activities are safe for you. Your health care provider may give you more instructions. Make sure you know what you can and cannot do. Contact a health care provider if: You are still sleepy or having trouble with balance after 24 hours. You feel light-headed. You vomit every time you eat or drink. You get a rash. You have a fever. You have redness or swelling around the IV site. Get help right away if: You have trouble breathing. You start to feel confused at home. These symptoms may be an emergency. Get help right away. Call 911. Do not wait to see if the symptoms will go away. Do not   drive yourself to the hospital. This information is not intended to replace advice given to you by your health care provider. Make sure you discuss any questions you have with your health care provider. 

## 2023-05-20 NOTE — Progress Notes (Signed)
Pharmacist Chemotherapy Monitoring - Initial Assessment    Anticipated start date: 05/27/23   The following has been reviewed per standard work regarding the patient's treatment regimen: The patient's diagnosis, treatment plan and drug doses, and organ/hematologic function Lab orders and baseline tests specific to treatment regimen  The treatment plan start date, drug sequencing, and pre-medications Prior authorization status  Patient's documented medication list, including drug-drug interaction screen and prescriptions for anti-emetics and supportive care specific to the treatment regimen The drug concentrations, fluid compatibility, administration routes, and timing of the medications to be used The patient's access for treatment and lifetime cumulative dose history, if applicable  The patient's medication allergies and previous infusion related reactions, if applicable   Changes made to treatment plan:  N/A  Follow up needed:  N/A   Richardean Sale, RPH, BCPS, BCOP 05/20/2023  3:56 PM

## 2023-05-26 NOTE — Assessment & Plan Note (Signed)
stage IB (T2, N0, Mx), diagnosed in April 2024 -Presented to the emergency room on 4/17 with jaundice and dark urine -Baseline bilirubin elevated at 8.6 -CT scan showed pancreatic head lesion with involvement of the distal CBD and associated biliary ductal dilation.  No evidence of lymphadenopathy or metastatic disease to the liver -Had ERCP and stent placement on 4/19 -Underwent EUS by Dr. Meridee Score on 04/08/23 showing 30 x 23 mm pancreatic head lesion without any vascular involvement.  -The final pathology showed pancreatic adenocarcinoma -She has met pancreatobiliary surgeon Dr. Donell Beers, and she declined the surgery.  Patient understands that her cancer is not curable without surgery. -We discussed the natural course of early stage pancreatic cancer, and the benefit of treatment to prolong her life and improve her quality life. -I encouraged her to consider chemotherapy for 3 to 6 months, with consolidation radiation -I also discussed option of radiation alone, due to her concern of potential side effect from chemotherapy.

## 2023-05-27 ENCOUNTER — Inpatient Hospital Stay: Payer: Medicare PPO

## 2023-05-27 ENCOUNTER — Inpatient Hospital Stay: Payer: Medicare PPO | Attending: Physician Assistant

## 2023-05-27 ENCOUNTER — Inpatient Hospital Stay: Payer: Medicare PPO | Admitting: Hematology

## 2023-05-27 ENCOUNTER — Encounter: Payer: Self-pay | Admitting: Hematology

## 2023-05-27 ENCOUNTER — Other Ambulatory Visit: Payer: Self-pay

## 2023-05-27 VITALS — BP 147/74 | HR 70 | Resp 18

## 2023-05-27 VITALS — BP 155/76 | HR 78 | Temp 98.1°F | Wt 147.9 lb

## 2023-05-27 DIAGNOSIS — C250 Malignant neoplasm of head of pancreas: Secondary | ICD-10-CM

## 2023-05-27 DIAGNOSIS — Z5111 Encounter for antineoplastic chemotherapy: Secondary | ICD-10-CM | POA: Insufficient documentation

## 2023-05-27 DIAGNOSIS — Z79899 Other long term (current) drug therapy: Secondary | ICD-10-CM | POA: Diagnosis not present

## 2023-05-27 DIAGNOSIS — Z95828 Presence of other vascular implants and grafts: Secondary | ICD-10-CM | POA: Insufficient documentation

## 2023-05-27 LAB — CMP (CANCER CENTER ONLY)
ALT: 10 U/L (ref 0–44)
AST: 19 U/L (ref 15–41)
Albumin: 3.6 g/dL (ref 3.5–5.0)
Alkaline Phosphatase: 142 U/L — ABNORMAL HIGH (ref 38–126)
Anion gap: 7 (ref 5–15)
BUN: 24 mg/dL — ABNORMAL HIGH (ref 8–23)
CO2: 26 mmol/L (ref 22–32)
Calcium: 8.9 mg/dL (ref 8.9–10.3)
Chloride: 104 mmol/L (ref 98–111)
Creatinine: 0.89 mg/dL (ref 0.44–1.00)
GFR, Estimated: >60 mL/min (ref >60)
Glucose, Bld: 121 mg/dL — ABNORMAL HIGH (ref 70–99)
Potassium: 3.9 mmol/L (ref 3.5–5.1)
Sodium: 137 mmol/L (ref 135–145)
Total Bilirubin: 1 mg/dL (ref 0.3–1.2)
Total Protein: 6.3 g/dL — ABNORMAL LOW (ref 6.5–8.1)

## 2023-05-27 LAB — CBC WITH DIFFERENTIAL (CANCER CENTER ONLY)
Abs Immature Granulocytes: 0.01 10*3/uL (ref 0.00–0.07)
Basophils Absolute: 0 10*3/uL (ref 0.0–0.1)
Basophils Relative: 1 %
Eosinophils Absolute: 0.1 10*3/uL (ref 0.0–0.5)
Eosinophils Relative: 2 %
HCT: 37 % (ref 36.0–46.0)
Hemoglobin: 12.8 g/dL (ref 12.0–15.0)
Immature Granulocytes: 0 %
Lymphocytes Relative: 24 %
Lymphs Abs: 1.6 10*3/uL (ref 0.7–4.0)
MCH: 32.7 pg (ref 26.0–34.0)
MCHC: 34.6 g/dL (ref 30.0–36.0)
MCV: 94.4 fL (ref 80.0–100.0)
Monocytes Absolute: 0.8 10*3/uL (ref 0.1–1.0)
Monocytes Relative: 12 %
Neutro Abs: 4.2 10*3/uL (ref 1.7–7.7)
Neutrophils Relative %: 61 %
Platelet Count: 288 10*3/uL (ref 150–400)
RBC: 3.92 MIL/uL (ref 3.87–5.11)
RDW: 12.3 % (ref 11.5–15.5)
WBC Count: 6.8 10*3/uL (ref 4.0–10.5)
nRBC: 0 % (ref 0.0–0.2)

## 2023-05-27 MED ORDER — SODIUM CHLORIDE 0.9% FLUSH
10.0000 mL | INTRAVENOUS | Status: DC | PRN
  Administered 2023-05-27: 10 mL

## 2023-05-27 MED ORDER — PROCHLORPERAZINE MALEATE 10 MG PO TABS
10.0000 mg | ORAL_TABLET | Freq: Once | ORAL | Status: AC
  Administered 2023-05-27: 10 mg via ORAL
  Filled 2023-05-27: qty 1

## 2023-05-27 MED ORDER — PACLITAXEL PROTEIN-BOUND CHEMO INJECTION 100 MG
100.0000 mg/m2 | Freq: Once | INTRAVENOUS | Status: AC
  Administered 2023-05-27: 175 mg via INTRAVENOUS
  Filled 2023-05-27: qty 35

## 2023-05-27 MED ORDER — SODIUM CHLORIDE 0.9 % IV SOLN
800.0000 mg/m2 | Freq: Once | INTRAVENOUS | Status: AC
  Administered 2023-05-27: 1406 mg via INTRAVENOUS
  Filled 2023-05-27: qty 36.98

## 2023-05-27 MED ORDER — SODIUM CHLORIDE 0.9 % IV SOLN: Freq: Once | INTRAVENOUS | Status: AC

## 2023-05-27 MED ORDER — HEPARIN SOD (PORK) LOCK FLUSH 100 UNIT/ML IV SOLN
500.0000 [IU] | Freq: Once | INTRAVENOUS | Status: AC | PRN
  Administered 2023-05-27: 500 [IU]

## 2023-05-27 NOTE — Progress Notes (Signed)
Nutrition  Message received from nursing that patient would like to be seen during infusion. Nutrition appointment cancelled for 6/25 and scheduled for 7/2 during next infusion.    Rehmat Murtagh B. Freida Busman, RD, LDN Registered Dietitian [phone]

## 2023-05-27 NOTE — Patient Instructions (Signed)
[facility]  Discharge Instructions: Thank you for choosing [facility] to provide your oncology and hematology care.   If you have a lab appointment with the Cancer Center, please go directly to the Cancer Center and check in at the registration area.   Wear comfortable clothing and clothing appropriate for easy access to any Portacath or PICC line.   We strive to give you quality time with your provider. You may need to reschedule your appointment if you arrive late (15 or more minutes).  Arriving late affects you and other patients whose appointments are after yours.  Also, if you miss three or more appointments without notifying the office, you may be dismissed from the clinic at the provider's discretion.      For prescription refill requests, have your pharmacy contact our office and allow 72 hours for refills to be completed.    Today you received the following chemotherapy and/or immunotherapy agents: Gemzar, Abraxane    To help prevent nausea and vomiting after your treatment, we encourage you to take your nausea medication as directed.  BELOW ARE SYMPTOMS THAT SHOULD BE REPORTED IMMEDIATELY: *FEVER GREATER THAN 100.4 F (38 C) OR HIGHER *CHILLS OR SWEATING *NAUSEA AND VOMITING THAT IS NOT CONTROLLED WITH YOUR NAUSEA MEDICATION *UNUSUAL SHORTNESS OF BREATH *UNUSUAL BRUISING OR BLEEDING *URINARY PROBLEMS (pain or burning when urinating, or frequent urination) *BOWEL PROBLEMS (unusual diarrhea, constipation, pain near the anus) TENDERNESS IN MOUTH AND THROAT WITH OR WITHOUT PRESENCE OF ULCERS (sore throat, sores in mouth, or a toothache) UNUSUAL RASH, SWELLING OR PAIN  UNUSUAL VAGINAL DISCHARGE OR ITCHING   Items with * indicate a potential emergency and should be followed up as soon as possible or go to the Emergency Department if any problems should occur.  Please show the CHEMOTHERAPY ALERT CARD or IMMUNOTHERAPY ALERT CARD at  check-in to the Emergency Department and triage nurse.  Should you have questions after your visit or need to cancel or reschedule your appointment, please contact [facility]  Dept: [phone]  and follow the prompts.  Office hours are 8:00 a.m. to 4:30 p.m. Monday - Friday. Please note that voicemails left after 4:00 p.m. may not be returned until the following business day.  We are closed weekends and major holidays. You have access to a nurse at all times for urgent questions. Please call the main number to the clinic Dept: [phone] and follow the prompts.   For any non-urgent questions, you may also contact your provider using MyChart. We now offer e-Visits for anyone 5 and older to request care online for non-urgent symptoms. For details visit [email or web address].   Also download the MyChart app! Go to the app store, search "MyChart", open the app, select [facility], and log in with your MyChart username and password.

## 2023-05-27 NOTE — Progress Notes (Signed)
[facility]   Telephone:([phone] Fax:([phone]) [phone]   Clinic Follow up Note   Patient Care Team: [name], MD as PCP - General (Family Medicine) Malachy Mood, MD as Consulting Physician (Oncology)  Date of Service:  05/27/2023  CHIEF COMPLAINT: f/u of Pancreatic Cancer   CURRENT THERAPY:  Pancreatic Abraxane D1,8,15 + Gemcitabine D1,8,15, q28d    ASSESSMENT:  Marie Ford is a 77 y.o. female with   Pancreatic cancer (HCC) stage IB (T2, N0, Mx), diagnosed in April 2024 -Presented to the emergency room on 4/17 with jaundice and dark urine -Baseline bilirubin elevated at 8.6 -CT scan showed pancreatic head lesion with involvement of the distal CBD and associated biliary ductal dilation.  No evidence of lymphadenopathy or metastatic disease to the liver -Had ERCP and stent placement on 4/19 -Underwent EUS by Dr. Meridee Score on 04/08/23 showing 30 x 23 mm pancreatic head lesion without any vascular involvement.  -The final pathology showed pancreatic adenocarcinoma -She has met pancreatobiliary surgeon Dr. Donell Beers, and she declined the surgery.  Patient understands that her cancer is not curable without surgery. -We discussed the natural course of early stage pancreatic cancer, and the benefit of treatment to prolong her life and improve her quality life. -I encouraged her to consider chemotherapy for 3 to 6 months, with consolidation radiation -I also discussed option of radiation alone, due to her concern of potential side effect from chemotherapy. -Patient has decided to start chemotherapy.  She is currently doing well, lab reviewed, adequate for treatment, will proceed with cycle 1 gemcitabine and Abraxane today with 20% dose reduction -Chemo consent obtained today.  We reviewed management of side effects, especially how to use antiemetics. -She will return in 2 weeks for cycle 2, if she tolerated well, I will increase her gemcitabine dose.   PLAN: -lab  reviewed -encourage the pt to stay active. -proceed  with D1,C1 gemzar/abraxane at reduce dose to day -pt will receive treatment every other week -lab\/flush and f/u  and treatment 7/2   SUMMARY OF ONCOLOGIC HISTORY: Oncology History Overview Note   Cancer Staging  Pancreatic cancer [facility]) Staging form: Exocrine Pancreas, AJCC 8th Edition - Clinical: Stage IB (cT2, cN0, cM0) - Signed by Heilingoetter, Cassandra L, PA-C on 04/12/2023 Total positive nodes: 0     Pancreatic cancer (HCC)  03/27/2023 Imaging   CT ABDOMEN PELVIS W CONTRAST   IMPRESSION: Low-attenuation mass centered along the pancreatic head with involvement of the distal common duct with the associated biliary duct ductal dilatation and pancreatic atrophy. Changes are worrisome for neoplasm either a pancreatic neoplasm or cholangiocarcinoma. Recommend further evaluation.   No liver metastases. No pathologically enlarged upper abdominal nodes.   Diffuse colonic stool.  Normal appendix.  Few diverticula.   Portions of the pelvis are obscured by the streak artifact from the bilateral hip arthroplasties.   03/29/2023 Imaging   DG ERCP   IMPRESSION: Intraoperative fluoroscopic images show dilated intra or extrahepatic bile ducts with high-grade stenosis at the level of the distal CBD. Biliary tree is decompressed following placement of common bile duct stent.   04/08/2023 Pathology Results   CYTOLOGY - NON PAP  CASE: [id number]  PATIENT: Marie Ford  Non-Gynecological Cytology Report   FINAL MICROSCOPIC DIAGNOSIS:  A. PANCREAS, HEAD, FINE NEEDLE ASPIRATION:  - Malignant cells present (see Comment)      04/08/2023 Procedure   EUS by Dr. Meridee Score   EUS impression  - A mass was identified in the pancreatic head/ genu  of the pancreas. Cytology results are pending. However, the endosonographic appearance is highly suspicious for adenocarcinoma. This was staged T2 N0 Mx by endosonographic criteria. The  staging applies if malignancy is confirmed. Fine needle biopsy performed.  - There was dilation in the common bile duct and in the common hepatic duct which measured up to 8 mm.  - One stent was visualized endosonographically within the common bile duct.  - Hyperechoic material consistent with sludge was visualized endosonographically in the gallbladder.  - Two enlarged lymph nodes were visualized in the celiac region ( level 20) and porta hepatis region. Tissue has not been obtained. However, the endosonographic appearance is suggestive of benign inflammatory change based on the visualization endosonographically.   04/12/2023 Initial Diagnosis   Pancreatic cancer (HCC)   04/12/2023 Cancer Staging   Staging form: Exocrine Pancreas, AJCC 8th Edition - Clinical: Stage IB (cT2, cN0, cM0) - Signed by Heilingoetter, Cassandra L, PA-C on 04/12/2023 Total positive nodes: 0   04/12/2023 Tumor Marker   Patient's tumor was tested for the following markers: CA 19.9. Results of the tumor marker test revealed 512.   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  Report date is 05/03/2023.   The CancerNext-Expanded gene panel offered by [facility] and includes sequencing, rearrangement, and RNA analysis for the following 71 genes:  AIP, ALK, APC, ATM, BAP1, BARD1, BMPR1A, BRCA1, BRCA2, BRIP1, CDC73, CDH1, CDK4, CDKN1B, CDKN2A, CHEK2, DICER1, FH, FLCN, KIF1B, LZTR1,MAX, MEN1, MET, MLH1, MSH2, MSH6, MUTYH, NF1, NF2, NTHL1, PALB2, PHOX2B, PMS2, POT1, PRKAR1A, PTCH1, PTEN, RAD51C,RAD51D, RB1, RET, SDHA, SDHAF2, SDHB, SDHC, SDHD, SMAD4, SMARCA4, SMARCB1, SMARCE1, STK11, SUFU, TMEM127, TP53,TSC1, TSC2 and VHL (sequencing and deletion/duplication); AXIN2, CTNNA1, EGFR, EGLN1, HOXB13, KIT, MITF, MSH3, PDGFRA, POLD1 and POLE (sequencing only); EPCAM and GREM1 (deletion/duplication only).     05/27/2023 -  Chemotherapy   Patient is on Treatment Plan : PANCREATIC Abraxane D1,8,15 +  Gemcitabine D1,8,15 q28d        INTERVAL HISTORY:  Marie Ford is here for a follow up of Pancreatic Cancer. She was last seen by me on 05/03/2023. She presents to the clinic accompanied by daughter.     All other systems were reviewed with the patient and are negative.  MEDICAL HISTORY:  Past Medical History:  Diagnosis Date   Chronic kidney disease    stage 3    Chronic sphenoidal sinusitis    Degenerative arthritis of hip    requirde surgery 2009   Depression 2010   Diplopia 2011   in left lateral gaze    Diplopia    " my eyes dont track together, i have corrective prisms in my eye glasses"    Diverticulosis 2006   noted in a colonoscopy   Esophageal reflux 2013   Hypertension    IGT (impaired glucose tolerance) 2011   denies hx of diabetes    Insomnia 2009   Medical history non-contributory    Mixed hyperlipidemia 2000   Osteoarthritis    Osteopenia 2005   Other and unspecified hyperlipidemia 2000   Other specified disease of sebaceous glands    Palpitations 2009   secondary to PSVT   Parkinson disease    PNA (pneumonia) 2014   Psoriasis 2004   PSVT (paroxysmal supraventricular tachycardia) 2009   "report it has been some time since i had that "    Situational stress    Urinary tract infection    thru wed 05/15/23, completed 3d antibiotics  Venous insufficiency since 2017    SURGICAL HISTORY: Past Surgical History:  Procedure Laterality Date   BILIARY BRUSHING  03/29/2023   Procedure: BILIARY BRUSHING;  Surgeon: Hilarie Fredrickson, MD;  Location[facility] ENDOSCOPY;  Service: Gastroenterology;;   BILIARY STENT PLACEMENT  03/29/2023   Procedure: BILIARY STENT PLACEMENT;  Surgeon: Hilarie Fredrickson, MD;  Location: [facility] ENDOSCOPY;  Service: Gastroenterology;;   BIOPSY  04/08/2023   Procedure: BIOPSY;  Surgeon: Lemar Lofty., MD;  Location: [facility] ENDOSCOPY;  Service: Gastroenterology;;   CATARACT EXTRACTION W/ INTRAOCULAR LENS  IMPLANT, BILATERAL     COLONOSCOPY   01/30/2010   Jarold Motto   ERCP N/A 03/29/2023   Procedure: ENDOSCOPIC RETROGRADE CHOLANGIOPANCREATOGRAPHY (ERCP);  Surgeon: Hilarie Fredrickson, MD;  Location[facility] ENDOSCOPY;  Service: Gastroenterology;  Laterality: N/A;   ESOPHAGOGASTRODUODENOSCOPY (EGD) WITH PROPOFOL N/A 04/08/2023   Procedure: ESOPHAGOGASTRODUODENOSCOPY (EGD) WITH PROPOFOL;  Surgeon: Meridee Score Netty Starring., MD;  Location: [facility] ENDOSCOPY;  Service: Gastroenterology;  Laterality: N/A;   ETHMOIDECTOMY Left 11/17/2020   Procedure: LEFT SIDED TOTAL ETHMOIDECTOMY;  Surgeon: Drema Halon, MD;  Location: [facility];  Service: ENT;  Laterality: Left;   EUS N/A 04/08/2023   Procedure: UPPER ENDOSCOPIC ULTRASOUND (EUS) LINEAR;  Surgeon: Lemar Lofty., MD;  Location: [facility] ENDOSCOPY;  Service: Gastroenterology;  Laterality: N/A;   EYE SURGERY     FINE NEEDLE ASPIRATION N/A 04/08/2023   Procedure: FINE NEEDLE ASPIRATION (FNA) LINEAR;  Surgeon: Lemar Lofty., MD;  Location: [facility] ENDOSCOPY;  Service: Gastroenterology;  Laterality: N/A;   HEMORRHOID SURGERY     IR IMAGING GUIDED PORT INSERTION  05/17/2023   JOINT REPLACEMENT     LASIK     left medial and superior rectus muscle recesion for diplopia correction     SINUS ENDO WITH FUSION Left 11/17/2020   Procedure: SINUS ENDOSCOPY WITH FUSION NAVIGATION;  Surgeon: Drema Halon, MD;  Location: [facility];  Service: ENT;  Laterality: Left;   SPHENOIDECTOMY Left 11/17/2020   Procedure: LEFT SIDED SPHENOIDECTOMY WITH TISSUE REMOVAL;  Surgeon: Drema Halon, MD;  Location: [facility];  Service: ENT;  Laterality: Left;   SPHINCTEROTOMY  03/29/2023   Procedure: SPHINCTEROTOMY;  Surgeon: Hilarie Fredrickson, MD;  Location: [facility] ENDOSCOPY;  Service: Gastroenterology;;   surgery of right eye rectus muscles for diplpia correction   2019   TOTAL HIP ARTHROPLASTY Right 2010   TOTAL HIP ARTHROPLASTY Left 08/19/2019   Procedure: TOTAL HIP  ARTHROPLASTY ANTERIOR APPROACH;  Surgeon: Ollen Gross, MD;  Location: [facility] ORS;  Service: Orthopedics;  Laterality: Left;    TURBINATE REDUCTION Left 11/17/2020   Procedure: LEFT SIDED TURBINATE REDUCTION;  Surgeon: Drema Halon, MD;  Location: [facility];  Service: ENT;  Laterality: Left;   URETHRAL SLING     midurethral sling with TVT Exact and Cystoscopy    I have reviewed the social history and family history with the patient and they are unchanged from previous note.  ALLERGIES:  is allergic to erythromycin, macrobid [nitrofurantoin], keflex [cephalexin], levofloxacin, penicillins, and sulfamethoxazole-trimethoprim.  MEDICATIONS:  Current Outpatient Medications  Medication Sig Dispense Refill   ALPRAZolam (XANAX) 0.5 MG tablet Take 0.25 mg by mouth as needed for anxiety. Takes as needed for sleep     AUVELITY 45-105 MG TBCR Take 1 tablet by mouth every evening.     buPROPion (WELLBUTRIN XL) 150 MG 24 hr tablet Take 150 mg by mouth daily.     Calcium Carbonate  Antacid (CALCIUM CARBONATE PO) Take by mouth.     carbidopa-levodopa (SINEMET IR) 25-100 MG tablet Take 1 tablet by mouth every 4 (four) hours as needed. (Patient taking differently: Take 1 tablet by mouth every 4 (four) hours.) 180 tablet 11   colestipol (COLESTID) 1 g tablet Take 2 g by mouth 2 (two) times daily. (Patient not taking: Reported on 04/12/2023)     ezetimibe (ZETIA) 10 MG tablet Take 10 mg by mouth daily.     lidocaine-prilocaine (EMLA) cream Apply to affected area once 30 g 3   losartan (COZAAR) 50 MG tablet Take 1 tablet (50 mg total) by mouth daily. 90 tablet 1   Multiple Vitamin (MULTIVITAMIN WITH MINERALS) TABS tablet Take 1 tablet by mouth daily. Women's Multivitamin     OVER THE COUNTER MEDICATION Take 1 tablet by mouth daily as needed (constipation).     pantoprazole (PROTONIX) 40 MG tablet Take 40 mg by mouth daily.     Polyethyl Glycol-Propyl Glycol (SYSTANE) 0.4-0.3 % SOLN  Place 1 drop into both eyes 3 (three) times daily as needed (dry/irritated eyes.).     polyethylene glycol (MIRALAX / GLYCOLAX) 17 g packet Take 17 g by mouth daily.     pravastatin (PRAVACHOL) 80 MG tablet Take 1 tablet (80 mg total) by mouth every evening.     prochlorperazine (COMPAZINE) 10 MG tablet Take 1 tablet (10 mg total) by mouth every 6 (six) hours as needed for nausea or vomiting. 30 tablet 1   promethazine (PHENERGAN) 25 MG tablet Take 25 mg by mouth every 6 (six) hours as needed for nausea or vomiting.     triamcinolone (NASACORT) 55 MCG/ACT AERO nasal inhaler Place 2 sprays into the nose daily. Use at night (Patient taking differently: Place 1 spray into the nose daily. Use at night) 1 each 12   No current facility-administered medications for this visit.   Facility-Administered Medications Ordered in Other Visits  Medication Dose Route Frequency Provider Last Rate Last Admin   sodium chloride flush (NS) 0.9 % injection 10 mL  10 mL Intracatheter PRN Malachy Mood, MD   10 mL at 05/27/23 1637    PHYSICAL EXAMINATION: ECOG PERFORMANCE STATUS: 1 - Symptomatic but completely ambulatory  Vitals:   05/27/23 1318  BP: (!) 155/76  Pulse: 78  Temp: 98.1 F (36.7 C)  SpO2: 97%   Wt Readings from Last 3 Encounters:  05/27/23 147 lb 14.4 oz (67.1 kg)  05/17/23 143 lb 8.3 oz (65.1 kg)  05/03/23 143 lb 11.2 oz (65.2 kg)     GENERAL:alert, no distress and comfortable SKIN: skin color normal, no rashes or significant lesions EYES: normal, Conjunctiva are pink and non-injected, sclera clear  NEURO: alert & oriented x 3 with fluent speech   LABORATORY DATA:  I have reviewed the data as listed    Latest Ref Rng & Units 05/27/2023   12:49 PM 04/12/2023    1:52 PM 03/30/2023    3:32 AM  CBC  WBC 4.0 - 10.5 K/uL 6.8  8.0  6.6   Hemoglobin 12.0 - 15.0 g/dL 40.9  81.1  91.4   Hematocrit 36.0 - 46.0 % 37.0  41.0  35.1   Platelets 150 - 400 K/uL 288  396  335         Latest Ref Rng  & Units 05/27/2023   12:49 PM 04/12/2023    1:52 PM 04/05/2023    9:58 AM  CMP  Glucose 70 - 99 mg/dL 782  104    BUN 8 - 23 mg/dL 24  25    Creatinine 1.61 - 1.00 mg/dL 0.96  0.45    Sodium 409 - 145 mmol/L 137  141    Potassium 3.5 - 5.1 mmol/L 3.9  4.0    Chloride 98 - 111 mmol/L 104  106    CO2 22 - 32 mmol/L 26  30    Calcium 8.9 - 10.3 mg/dL 8.9  9.4    Total Protein 6.5 - 8.1 g/dL 6.3  7.2  6.7   Total Bilirubin 0.3 - 1.2 mg/dL 1.0  1.5  1.7   Alkaline Phos 38 - 126 U/L 142  137  149   AST 15 - 41 U/L 19  33  32   ALT 0 - 44 U/L 10  22  26        RADIOGRAPHIC STUDIES: I have personally reviewed the radiological images as listed and agreed with the findings in the report. No results found.    Orders Placed This Encounter  Procedures   CBC with Differential (Cancer Center Only)    Standing Status:   Future    Standing Expiration Date:   07/21/2024   CMP (Cancer Center only)    Standing Status:   Future    Standing Expiration Date:   07/21/2024   CBC with Differential (Cancer Center Only)    Standing Status:   Future    Standing Expiration Date:   08/04/2024   CMP (Cancer Center only)    Standing Status:   Future    Standing Expiration Date:   08/04/2024   All questions were answered. The patient knows to call the clinic with any problems, questions or concerns. No barriers to learning was detected. The total time spent in the appointment was 30 minutes.     Malachy Mood, MD 05/27/2023   Carolin Coy, CMA, am acting as scribe for Malachy Mood, MD.   I have reviewed the above documentation for accuracy and completeness, and I agree with the above.

## 2023-05-28 ENCOUNTER — Telehealth: Payer: Self-pay

## 2023-05-28 NOTE — Telephone Encounter (Signed)
-----   Message from Curtis Sites, RN sent at 05/27/2023  4:44 PM EDT ----- Regarding: Dr. [name] 1st tx f/u cal Dr. [name] 1st tx fu call. Abraxan Gemzar, tolerated well

## 2023-05-28 NOTE — Telephone Encounter (Signed)
LM for patient that this nurse was calling to see how they were doing after their treatment. Please call back to Dr.  Feng's nurse at [phone] if they have any questions or concerns regarding the treatment. 

## 2023-05-30 ENCOUNTER — Ambulatory Visit: Payer: Medicare PPO | Admitting: Neurology

## 2023-05-30 ENCOUNTER — Encounter: Payer: Self-pay | Admitting: Neurology

## 2023-05-30 VITALS — BP 91/53 | HR 88 | Ht 66.0 in | Wt 145.0 lb

## 2023-05-30 DIAGNOSIS — Z5320 Procedure and treatment not carried out because of patient's decision for unspecified reasons: Secondary | ICD-10-CM

## 2023-05-30 DIAGNOSIS — R448 Other symptoms and signs involving general sensations and perceptions: Secondary | ICD-10-CM

## 2023-05-30 DIAGNOSIS — F03A3 Unspecified dementia, mild, with mood disturbance: Secondary | ICD-10-CM | POA: Diagnosis not present

## 2023-05-30 DIAGNOSIS — R634 Abnormal weight loss: Secondary | ICD-10-CM

## 2023-05-30 DIAGNOSIS — R410 Disorientation, unspecified: Secondary | ICD-10-CM

## 2023-05-30 DIAGNOSIS — R4701 Aphasia: Secondary | ICD-10-CM

## 2023-05-30 DIAGNOSIS — C259 Malignant neoplasm of pancreas, unspecified: Secondary | ICD-10-CM | POA: Diagnosis not present

## 2023-05-30 DIAGNOSIS — N3000 Acute cystitis without hematuria: Secondary | ICD-10-CM

## 2023-05-30 DIAGNOSIS — R4189 Other symptoms and signs involving cognitive functions and awareness: Secondary | ICD-10-CM | POA: Diagnosis not present

## 2023-05-30 DIAGNOSIS — R269 Unspecified abnormalities of gait and mobility: Secondary | ICD-10-CM

## 2023-05-30 DIAGNOSIS — C7931 Secondary malignant neoplasm of brain: Secondary | ICD-10-CM

## 2023-05-30 MED ORDER — CARBIDOPA-LEVODOPA 25-100 MG PO TABS: 1.5000 | ORAL_TABLET | ORAL | 4 refills | Status: DC | PRN

## 2023-05-30 MED ORDER — DONEPEZIL HCL 5 MG PO TABS: 5.0000 mg | ORAL_TABLET | Freq: Every day | ORAL | 0 refills | Status: DC

## 2023-05-30 NOTE — Progress Notes (Signed)
[name]    Provider:  Dr [name] Requesting Provider: [name], MD Primary Care Provider:  [name], MD  CC:  Headache, memory loss, gait abnormality  05/30/2023: recent diagnosis of pancreatic cancer and not feeling well, had first chemo, feels cognition is worsening. She takes friend is here and provides much information.1.5 tabs 4x a day, 7am,11pm,3pm,7pm. She goes to bed between 8-930pm. We increaed dose because the tremors were worsening since increasing dose its helped. She has lost weight over last 1.5 years. Here with [name] who is moving into [place] like the patient. Friend thinks her cognition has really declined and sometimes she will ask bizarre questions but had a recent UTI finished treatment, friends says she has a poor appetite and patient said 3 weeks but its been 3 months or more, forgetting things. First chemo was this past Monday. She is more tired and tired a great deal and naps every day. She forgets words. She sleeps in until 9-10 every day. She oresented in April with jaundice and found Obstructive jaundice due to pancreatic mass concerning for malignancy: Reports normal colo over 10 years ago also Hyperbilirubinemia/elevated liver enzymes. She has word-finding problems, cognitive impairment.   Patient complains of symptoms per HPI as well as the following symptoms: fatigue . Pertinent negatives and positives per HPI. All others negative   10/17/2022: Patient states she wears off about 1/2-3/4 hr before her next dose is due. Also she feels her cognition, decision making, word finding, forgetfullness, fatigue is worsening. We will increase sinemet to every 4 hours. She has started TMS and feels it is helping her mood. She hasn't been crying as much and she is feeling better. Patient requests follow up neurocognitive testing with Dr. [name], she is willing to wait 8 months to a year, she had testing in 2022 and feels cognition is  worsening, she has parkinson's disease.  Patient complains of symptoms per HPI as well as the following symptoms: cognitive decline . Pertinent negatives and positives per HPI. All others negative   04/11/2022: she moved back to [place] permanently, her dog passed away and this hurt her heart. She is moving into [employer]. She gets very tired easily, she has fatigue even if she had a good night's sleep, she has dry mouth in the morning, do not feel refreshed but also don't fall asleep during the day or nap so unlikely. BP is great.  She had a comprehensive examination 2 weeks ago annual and had blood work completed. She thinks stress is a lot of it and she had a sinus surgery and she is healing. She exercises 20 minutes a day. No falls. She went to physical therapy and she is doing all the exercises. She walks. She feels better after resting. Tremors return after 4-5 hours and that is when she takes a sinemet 730, 1230, 530-6.  The medicine might make her a little tired. She is also on alprazolam at night and takes it for stress during the day and that may make her tired as well. She has chronic diplopia and has prisms in her glasses continuous had 2 surgeries since 2011  she corrected both sides at [facility] for strabismus last surgery 2019. She had an EKG with [name] and it was good per patient. Here with friend. She is not as alert or as detailed stressful house move. She has a lot of anxiety recently.   Patient complains of symptoms per HPI as well as the following symptoms: stress .  Pertinent negatives and positives per HPI. All others negative   HPI 01/16/2021: [name]  Marie Ford is a 77 y.o. female here as requested by [name], MD for "headaches, ataxia and cognitive impairment".  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  I reviewed Dr. [name] notes: Over the last year she has had chronic symptoms of headaches, CT scan of the sinuses in April showed sphenoid sinusitis, she had similar findings the year before, she saw Dr. [name] in ENT in April of this year and he did not feel the sinusitis required surgery was necessarily the cause of her headaches.  Associated with her headaches have been increasing symptoms of anxiety and depression, much of this related to stress, recently improved on fluoxetine, however the headaches fatigue and nausea have continued.  She also complains of ataxia over the last few months a few times a week as well as cognitive impairment short-term memory deficits and she is interested in more formal neuropsychological testing.  MRI of the brain in October was unremarkable but also showed the sphenoid sinusitis.   She is here with her friend of 35 years, headaches are improved since surgery and antibiotic. Over the last 2 years she has had cognitive decline and physicial decline and lots of stress, hip replace,ent, 18-year relationship ended, she had to sell a house sh eloved and some communication problems with daughter, headaches are not so much the issue anymore. She has tremors under stress and feels "jittery". The left side doesn't work as well on the left side, she has imbalance on the left side, grip is weakness on the left side, hand writing has changed has gotten smaller. She feels clumsy and slow, her friend provides much information and says she is having difficulty with fly fishing and she is a well known for her fly fishing, her gait is different and she is weak, chronic fatigue,  smell and taste is pretty much intact but not great she can't tell the difference between teas, some of the scent is gone, swallowing is ok, tremor is resting and with action, worse when under stress, left is worse. Friend has noticed her memory is declining more than she thinks is normal, she is not sleeping well at all, trouble falling asleep. She tries to meditate, she may try to eat something, she takes Xanax sometimes. Her gait is different, her left side is affected, hard to move that side and she doesn't swing her left arm. She walks her dog every day. Difficulty with the left hand with coordination, no falls, feels like she is shuffling, not lightheaded, she feels unbalanced, difficulty tieing the flys for fly fishing. Father died of stroke, no parkinson's disease. She is having difficulty remembering to do things, she loses steps, she has compensation techniques and uses her calendars, not getting lost, no hallucinations or delusions, no acting out dreams, but she is having vivid dreams, more dreaming, much more emotional and depressed but not overreacting. Friend states he looks sadder more flat in affect.   Reviewed notes, labs and imaging from outside physicians, which showed:   October 07, 2020: CMP with  BUN 24, creatinine 1.12, glucose 101, otherwise unremarkable, TSH 2.59, sed rate 17 normal, CBC normal.  CT Sinuses 11/10/2020: reviewed report and personally reviewed images (also reviewed with patient) FINDINGS: Paranasal sinuses:   Frontal: Aplastic on the left and hypoplastic on the right   Ethmoid: Frothy secretions in the posterior left ethmoid air cell.   Maxillary: Small presumed retention cysts on the right more than left.   Sphenoid: Chronic left sphenoid sinusitis with complete opacification and sclerotic wall thickening. Sclerotic wall thickening has progressed and the ostium appears covered by bone currently.   Right ostiomeatal unit: Patent.   Left ostiomeatal unit:  Patent.   Nasal passages: Patent. Intact nasal septum is essentially midline.   Anatomy: No pneumatization superior to anterior ethmoid notches. Sellar sphenoid pneumatization pattern. No dehiscence of carotid or optic canals. No onodi cell.   Other: No incidental finding on soft tissue windows.   IMPRESSION: 1. Chronic left sphenoid sinusitis with progressive sclerotic wall thickening since May 2021. The ostium now appears covered by calcification. 2. Continued frothy secretions in the left posterior ethmoid air Cell.  MRI brain 09/30/2020: Mild  chronic microvascular ischemic changes, typical for age.(personally reviewed and agree with findings) reviewed report and personally reviewed images (also reviewed with patient)  10/05/2021: Here for follow up. Was seen at [facility] by a movement disorder specialist, her recommendations were to continue Sinemet, we reviewed the notes and the appointment at [facility] together with her good friend today, we reviewed resources in the community and I provided her with documentation on Parkinson's groups, Parkinson's specific exercise groups, patient, also patient feels she is depressed and has anxiety, would discuss with primary care [name], per patient she has been tried on multiple medications, we did discuss at this point referring her to PA at [facility] that can help her with medication management and also refer her to a therapist. I can refer her for physical therapy for Parkinson's disease specific PT. Headaches resolved after sphenoidectomy.   Interval history 05/03/2021: DAT scan consistent with parkinsonian disorder. We discussed today. I recommend patient be transitioned to [facility] Movements Disorder Team. They are the leaders in parkinson's Disease in this area.   She has tremor in her left hand at rest, worse with stress, she has some cognitive impairments and weak, she feels she can;t find the words and can't understand  directions. We discussed speech therapy, they are concerned about driving and he has a weakness in visual memory, we discussed a driving test, discussed regular skin tests with a dermatologist.     DAT Scan 02/09/2021: Marked decreased activity within the LEFT and RIGHT striatum. Slightly greater deficit on the RIGHT. This pattern can be found in Parkinsonian syndromes pathology.  Review of Systems: Patient complains of symptoms per HPI as well as the following symptoms: anxiety . Pertinent negatives and positives per HPI. All others negative .   Social History   Socioeconomic History   Marital status: Single    Spouse name: Not on file   Number of children: 1   Years of education: Not on file   Highest education level: Doctorate  Occupational History   Occupation: retired  Tobacco Use   Smoking status: Never   Smokeless tobacco: Never  Vaping Use   Vaping Use: Never used  Substance and Sexual Activity   Alcohol use: Yes    Comment: rarely   Drug use: No   Sexual activity: Not Currently    Birth control/protection:  Post-menopausal  Other Topics Concern   Not on file  Social History Narrative   Lives at [facility] independent living   Right handed   Caffeine: rarely   Social Determinants of Health   Financial Resource Strain: Not on file  Food Insecurity: No Food Insecurity (03/29/2023)   Hunger Vital Sign    Worried About Running Out of Food in the Last Year: Never true    Ran Out of Food in the Last Year: Never true  Transportation Needs: No Transportation Needs (03/29/2023)   PRAPARE - Administrator, Civil Service (Medical): No    Lack of Transportation (Non-Medical): No  Physical Activity: Not on file  Stress: Not on file  Social Connections: Not on file  Intimate Partner Violence: Not At Risk (03/29/2023)   Humiliation, Afraid, Rape, and Kick questionnaire    Fear of Current or Ex-Partner: No    Emotionally Abused: No    Physically Abused: No     Sexually Abused: No    Family History  Problem Relation Age of Onset   Congestive Heart Failure Mother    CVA Father    Heart failure Father    Non-Hodgkin's lymphoma Sister    Alcohol abuse Sister    Liver cancer Brother    Colon cancer Brother 68   Colitis Brother    Hypertension Brother    Gout Brother    Melanoma Daughter    Parkinson's disease Cousin    Esophageal cancer Neg Hx    Rectal cancer Neg Hx    Stomach cancer Neg Hx    Colon polyps Neg Hx     Past Medical History:  Diagnosis Date   Chronic kidney disease    stage 3    Chronic sphenoidal sinusitis    Degenerative arthritis of hip    requirde surgery 2009   Depression 2010   Diplopia 2011   in left lateral gaze    Diplopia    " my eyes dont track together, i have corrective prisms in my eye glasses"    Diverticulosis 2006   noted in a colonoscopy   Esophageal reflux 2013   Hypertension    IGT (impaired glucose tolerance) 2011   denies hx of diabetes    Insomnia 2009   Medical history non-contributory    Mixed hyperlipidemia 2000   Osteoarthritis    Osteopenia 2005   Other and unspecified hyperlipidemia 2000   Other specified disease of sebaceous glands    Palpitations 2009   secondary to PSVT   Parkinson disease    PNA (pneumonia) 2014   Psoriasis 2004   PSVT (paroxysmal supraventricular tachycardia) 2009   "report it has been some time since i had that "    Situational stress    Urinary tract infection    thru wed 05/15/23, completed 3d antibiotics   Venous insufficiency since 2017    Patient Active Problem List   Diagnosis Date Noted   Port-A-Cath in place 05/27/2023   Genetic testing 04/29/2023   Pancreatic cancer (HCC) 04/12/2023   Acute hyponatremia 03/28/2023   Allergic rhinitis 03/28/2023   Obstructive jaundice 03/28/2023   Pancreatic mass 03/28/2023   Anxiety 10/08/2021   Depression 10/08/2021   Parkinson disease 10/04/2021   OA (osteoarthritis) of hip 08/19/2019    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 ENDOSCOPY;  Service: Gastroenterology;;   BILIARY STENT PLACEMENT  03/29/2023   Procedure: BILIARY STENT PLACEMENT;  Surgeon: Hilarie Fredrickson, MD;  Location: [facility] ENDOSCOPY;  Service: Gastroenterology;;   BIOPSY  04/08/2023   Procedure: BIOPSY;  Surgeon: Lemar Lofty., MD;  Location: [facility] ENDOSCOPY;  Service: Gastroenterology;;   CATARACT EXTRACTION W/ INTRAOCULAR LENS  IMPLANT, BILATERAL     COLONOSCOPY  01/30/2010   Jarold Motto   ERCP N/A 03/29/2023   Procedure: ENDOSCOPIC RETROGRADE CHOLANGIOPANCREATOGRAPHY (ERCP);  Surgeon: Hilarie Fredrickson, MD;  Location: [facility] ENDOSCOPY;  Service: Gastroenterology;  Laterality: N/A;   ESOPHAGOGASTRODUODENOSCOPY (EGD) WITH PROPOFOL N/A 04/08/2023   Procedure: ESOPHAGOGASTRODUODENOSCOPY (EGD) WITH PROPOFOL;  Surgeon: Meridee Score Netty Starring., MD;  Location: [facility] ENDOSCOPY;  Service: Gastroenterology;  Laterality: N/A;   ETHMOIDECTOMY Left 11/17/2020   Procedure: LEFT SIDED TOTAL ETHMOIDECTOMY;  Surgeon: Drema Halon, MD;  Location: [facility];  Service: ENT;  Laterality: Left;   EUS N/A 04/08/2023   Procedure: UPPER ENDOSCOPIC ULTRASOUND (EUS) LINEAR;  Surgeon: Lemar Lofty., MD;  Location: [facility] ENDOSCOPY;  Service: Gastroenterology;  Laterality: N/A;   EYE SURGERY     FINE NEEDLE ASPIRATION N/A 04/08/2023   Procedure: FINE NEEDLE ASPIRATION (FNA) LINEAR;   Surgeon: Lemar Lofty., MD;  Location: [facility] ENDOSCOPY;  Service: Gastroenterology;  Laterality: N/A;   HEMORRHOID SURGERY     IR IMAGING GUIDED PORT INSERTION  05/17/2023   JOINT REPLACEMENT     LASIK     left medial and superior rectus muscle recesion for diplopia correction     SINUS ENDO WITH FUSION Left 11/17/2020   Procedure: SINUS ENDOSCOPY WITH FUSION NAVIGATION;  Surgeon: Drema Halon, MD;  Location: [facility];  Service: ENT;  Laterality: Left;   SPHENOIDECTOMY Left 11/17/2020   Procedure: LEFT SIDED SPHENOIDECTOMY WITH TISSUE REMOVAL;  Surgeon: Drema Halon, MD;  Location: [facility];  Service: ENT;  Laterality: Left;   SPHINCTEROTOMY  03/29/2023   Procedure: SPHINCTEROTOMY;  Surgeon: Hilarie Fredrickson, MD;  Location: [facility] ENDOSCOPY;  Service: Gastroenterology;;   surgery of right eye rectus muscles for diplpia correction   2019   TOTAL HIP ARTHROPLASTY Right 2010   TOTAL HIP ARTHROPLASTY Left 08/19/2019   Procedure: TOTAL HIP ARTHROPLASTY ANTERIOR APPROACH;  Surgeon: Ollen Gross, MD;  Location: [facility] ORS;  Service: Orthopedics;  Laterality: Left;    TURBINATE REDUCTION Left 11/17/2020   Procedure: LEFT SIDED TURBINATE REDUCTION;  Surgeon: Drema Halon, MD;  Location:  SURGERY CENTER;  Service: ENT;  Laterality: Left;   URETHRAL SLING     midurethral sling with TVT Exact and Cystoscopy    Current Outpatient Medications  Medication Sig Dispense Refill   ALPRAZolam (XANAX) 0.5 MG tablet Take 0.25 mg by mouth as needed for anxiety. Takes as needed for sleep     AUVELITY 45-105 MG TBCR Take 1 tablet by mouth every evening.     buPROPion (WELLBUTRIN XL) 150 MG 24 hr tablet Take 150 mg by mouth daily.     Calcium Carbonate Antacid (CALCIUM CARBONATE PO) Take by mouth.     donepezil (ARICEPT) 5 MG tablet Take 1 tablet (  5 mg total) by mouth at bedtime. 30 tablet 0   lidocaine-prilocaine (EMLA) cream Apply to affected  area once 30 g 3   losartan (COZAAR) 50 MG tablet Take 1 tablet (50 mg total) by mouth daily. 90 tablet 1   Multiple Vitamin (MULTIVITAMIN WITH MINERALS) TABS tablet Take 1 tablet by mouth daily. Women's Multivitamin     OVER THE COUNTER MEDICATION Take 1 tablet by mouth daily as needed (constipation).     pantoprazole (PROTONIX) 40 MG tablet Take 40 mg by mouth daily.     Polyethyl Glycol-Propyl Glycol (SYSTANE) 0.4-0.3 % SOLN Place 1 drop into both eyes 3 (three) times daily as needed (dry/irritated eyes.).     polyethylene glycol (MIRALAX / GLYCOLAX) 17 g packet Take 17 g by mouth daily.     pravastatin (PRAVACHOL) 80 MG tablet Take 1 tablet (80 mg total) by mouth every evening.     prochlorperazine (COMPAZINE) 10 MG tablet Take 1 tablet (10 mg total) by mouth every 6 (six) hours as needed for nausea or vomiting. 30 tablet 1   promethazine (PHENERGAN) 25 MG tablet Take 25 mg by mouth every 6 (six) hours as needed for nausea or vomiting.     triamcinolone (NASACORT) 55 MCG/ACT AERO nasal inhaler Place 2 sprays into the nose daily. Use at night (Patient taking differently: Place 1 spray into the nose daily. Use at night) 1 each 12   carbidopa-levodopa (SINEMET IR) 25-100 MG tablet Take 1.5 tablets by mouth every 4 (four) hours as needed. -Try to separate Sinemet from food (especially protein-rich foods like meat, dairy, eggs) by about 30-60 mins - this will help the absorption of the medication. If you have some nausea with the medication, you can take it with some light food like crackers or ginger ale 540 tablet 4   No current facility-administered medications for this visit.    Allergies as of 05/30/2023 - Review Complete 05/30/2023  Allergen Reaction Noted   Erythromycin Shortness Of Breath and Rash    Macrobid [nitrofurantoin] Other (See Comments) 12/23/2017   Keflex [cephalexin] Nausea And Vomiting 03/27/2023   Levofloxacin Nausea Only 10/04/2021   Penicillins Rash     Sulfamethoxazole-trimethoprim Rash 10/19/2020    Vitals: BP (!) 91/53   Pulse 88   Ht 5\' 6"  (1.676 m)   Wt 145 lb (65.8 kg)   BMI 23.40 kg/m  Last Weight:  Wt Readings from Last 1 Encounters:  05/30/23 145 lb (65.8 kg)   Last Height:   Ht Readings from Last 1 Encounters:  05/30/23 5\' 6"  (1.676 m)   Physical exam: Exam: Gen: NAD, conversant, , well groomed                     CV: RRR, no MRG. No Carotid Bruits. No peripheral edema, warm, nontender Eyes: Conjunctivae clear without exudates or hemorrhage  Neuro: Detailed Neurologic Exam  Speech:    Speech is normal; fluent and spontaneous with normal comprehension.  Cognition:       05/30/2023   12:45 PM 01/16/2021   10:31 AM  MMSE - Mini Mental State Exam  Orientation to time 5 5  Orientation to Place 5 5  Registration 3 3  Attention/ Calculation 5 4  Recall 2 3  Language- name 2 objects 2 2  Language- repeat 1 1  Language- follow 3 step command 3 3  Language- read & follow direction 1 1  Write a sentence 1 1  Copy design 0 1  Total score 28 29      05/30/2023   12:53 PM  Montreal Cognitive Assessment   Visuospatial/ Executive (0/5) 0  Naming (0/3) 3  Attention: Read list of digits (0/2) 1  Attention: Read list of letters (0/1) 1  Attention: Serial 7 subtraction starting at 100 (0/3) 1  Language: Repeat phrase (0/2) 1  Language : Fluency (0/1) 1  Abstraction (0/2) 2  Delayed Recall (0/5) 0  Orientation (0/6) 6  Total 16    Cranial Nerves: slight hypomimia    The pupils are equal, round, and reactive to light. The fundi are normal and spontaneous venous pulsations are present. Visual fields are full to finger confrontation. Extraocular movements are intact. Trigeminal sensation is intact and the muscles of mastication are normal. The face is symmetric. The palate elevates in the midline. Hearing intact. Voice is normal. Shoulder shrug is normal. The tongue has normal motion without fasciculations.   Coordination:    Bradykinesia with finger-to-nose, difficulty with finger taps on the left but still improved since started taking Sinemet Gait:    Bradykinesia, slightly stooped, does not move her arms when walking, en bloc turning, low clearance and short strides Motor Observation:    No asymmetry, no atrophy, and no involuntary movements noted.  No tremor postural or resting noted today Tone: Cogwheeling in the uppers  posture:    Slightly stooped   Strength: Left hip and leg flexion still with mild weakness otherwise strength is V/V in the upper and lower limbs.     Sensation: intact to LT    Reflex Exam: DTR's:    Deep tendon reflexes in the upper and lower extremities are brisk bilaterally.   Toes:    The toes are equiv bilaterally.   Clonus:    Clonus is absent.  :Assessment/Plan:   Lovely 77 y.o. female here as requested by [name], MD for follow up of Parkinson's Disease. Recent diagnosis of pancreatic cancer on chemo, declining physically and mentally per friend here today.  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  -increased dose of sinemet to 1.5 4x a day from 1 4x a day has helped -  worsening cognition, decision making, word finding, forgetfullness, fatigue is worsening. Aphasia. Recent diagnosis of pancreatic cancer will repeat MRI brain w/wo contrast to ensure no strokes or metastasis or other reason for worsening cognition/dementia [facility] 16/30 - Seeing Dr. [name] this October repeat neuropsych  testing - labs and urine today -Has appt July 9th if the mri is unremarkable push out appt to 3 months -Start aricept and in 30 days if no side effects let me know so I can call in 10mg .  - Has a psychiatrist Tamela Oddi and a talk therapist, TMS is helping - We reviewed the [facility] movement disorder notes and the appointment at [facility] together with her good friend last appointment. another good friend is with her again today, she recently married and they are very thrilled about that. - At prior appointment, reviewed resources in the community and I provided her with information on Parkinson's groups, Parkinson's specific exercise groups, she is taking advantage of all of these  and is doing extremely well except for her stress as state above, she is working with a Orthoptist at [facility] to see if they can start a support group - went to PT for balance, gait disorder. Gait is much better today, she is doing excellent .- headaches resolved after sphenoidectomy. - Now established with Dr. Evern Bio at [facility] but will follow with me until/if needed to see [facility] again.   Meds ordered this encounter  Medications   donepezil (ARICEPT) 5 MG tablet    Sig: Take 1 tablet (5 mg total) by mouth at bedtime.    Dispense:  30 tablet    Refill:  0   carbidopa-levodopa (SINEMET IR) 25-100 MG tablet    Sig: Take 1.5 tablets by mouth every 4 (four) hours as needed. -Try to separate Sinemet from food (especially protein-rich foods like meat, dairy, eggs) by about 30-60 mins - this will help the absorption of the medication. 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 She has a postural and action tremor. 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 We discussed motor and non motor symptoms, disease progression, treatments (dopamine), was seen at [facility] and will continue to follow with me  - Continue Sinemet very low dose, discussed side effects  - Important to keep active, activity may slow down progression,   - She moved to an independent living facility, lots of changes ongoing in her life  - make sure she has POA and HCPOA  - Gait abnormality : MRI cervical spine: MRI of the cervical spine shows some slight arthritic changes but the spinal cord is normal. I don;t see any problems in the cervical spine to be causing your symptoms. . (MRI brain normal) did not show any etiology for gait abnormality. Will refer to PT for balance, gait disorder.    - Cognitive complaints: Blood work completed, formal memory testing was overall positive, will repeat when needed, Speech Therapy for difficulty with expression and reception  - Discussed fall risks, orthostatic symptoms in PD and constipation and other things to watch for. Stay hydrated.   - Recommended speech therapy for her difficulty getting words out  - Recommend common sense with driving and consider formal driving test if needed in the future. She has very good insight and judgement.  - regular skin tests, increased risk of skin cancer in PD patient's  - Fatigue: May consider sleep evaluation, she has very vivid dreams.  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   - Follow up with [name] for her anxiety   Cc: [name], MD,  [name], MD  I spent over 60 minutes of face-to-face and non-face-to-face  time with patient on the  1. Cognitive decline   2. Acute cystitis without hematuria   3. Feels cold   4. Mild dementia with mood disturbance, unspecified dementia  type (HCC)   5. Malignant neoplasm of pancreas, unspecified location of malignancy (HCC)   6. Assessment of physical health declined   7. Weight loss   8. Gait abnormality   9. Confusion and disorientation   10. Aphasia   11. eval for Metastasis to brain [facility])    diagnosis.  This included previsit chart review, lab review, study review, order entry, electronic health record documentation, patient education on the different diagnostic and therapeutic options, counseling and coordination of care, risks and benefits of management, compliance, or risk factor reduction   Naomie Dean, MD  [facility] [address] [place], [place] [address]  Phone [phone] Fax [phone]  I spent over 40 minutes of face-to-face and non-face-to-face time with patient on the  1. Cognitive decline   2. Acute cystitis without hematuria   3. Feels cold   4. Mild dementia with mood disturbance, unspecified dementia type (HCC)   5. Malignant neoplasm of pancreas, unspecified location of malignancy (HCC)   6. Assessment of physical health declined   7. Weight loss   8. Gait abnormality   9. Confusion and disorientation   10. Aphasia   11. eval for Metastasis to brain [facility])      diagnosis.  This included previsit chart review, lab review, study review, order entry, electronic health record documentation, patient education on the different diagnostic and therapeutic options, counseling and coordination of care, risks and benefits of management, compliance, or risk factor reduction

## 2023-05-30 NOTE — Patient Instructions (Addendum)
Repeat MRI brain Seeing Dr. [name] this October Continue this sinemet dose Will let you know about the lab tests and results Has appt July 9th if the mri is unremarkable push out appt to 3 months Start aricept and in 30 days if no side effects let me know so I can call in 10mg .   Donepezil Tablets What is this medication? DONEPEZIL (doe NEP e zil) treats memory loss and confusion (dementia) in people who have Alzheimer disease. It works by improving attention, memory, and the ability to engage in daily activities. It is not a cure for dementia or Alzheimer disease. This medicine may be used for other purposes; ask your health care provider or pharmacist if you have questions. COMMON BRAND NAME(S): Aricept What should I tell my care team before I take this medication? They need to know if you have any of these conditions: Asthma or other lung disease Difficulty passing urine Head injury Heart disease History of irregular heartbeat Liver disease Seizures (convulsions) Stomach or intestinal disease, ulcers or stomach bleeding An unusual or allergic reaction to donepezil, other medications, foods, dyes, or preservatives Pregnant or trying to get pregnant Breast-feeding How should I use this medication? Take this medication by mouth with a glass of water. Follow the directions on the prescription label. You may take this medication with or without food. Take this medication at regular intervals. This medication is usually taken before bedtime. Do not take it more often than directed. Continue to take your medication even if you feel better. Do not stop taking except on your care team's advice. If you are taking the 23 mg donepezil tablet, swallow it whole; do not cut, crush, or chew it. Talk to your care team about the use of this medication in children. Special care may be needed. Overdosage: If you think you have taken too much of this medicine contact a poison control center or  emergency room at once. NOTE: This medicine is only for you. Do not share this medicine with others. What if I miss a dose? If you miss a dose, take it as soon as you can. If it is almost time for your next dose, take only that dose, do not take double or extra doses. What may interact with this medication? Do not take this medication with any of the following: Certain medications for fungal infections like itraconazole, fluconazole, posaconazole, and voriconazole Cisapride Dextromethorphan; quinidine Dronedarone Pimozide Quinidine Thioridazine This medication may also interact with the following: Antihistamines for allergy, cough and cold Atropine Bethanechol Carbamazepine Certain medications for bladder problems like oxybutynin, tolterodine Certain medications for Parkinson's disease like benztropine, trihexyphenidyl Certain medications for stomach problems like dicyclomine, hyoscyamine Certain medications for travel sickness like scopolamine Dexamethasone Dofetilide Ipratropium NSAIDs, medications for pain and inflammation, like ibuprofen or naproxen Other medications for Alzheimer's disease Other medications that prolong the QT interval (cause an abnormal heart rhythm) Phenobarbital Phenytoin Rifampin, rifabutin or rifapentine Ziprasidone This list may not describe all possible interactions. Give your health care provider a list of all the medicines, herbs, non-prescription drugs, or dietary supplements you use. Also tell them if you smoke, drink alcohol, or use illegal drugs. Some items may interact with your medicine. What should I watch for while using this medication? Visit your care team for regular checks on your progress. Check with your care team if your symptoms do not get better or if they get worse. You may get drowsy or dizzy. Do not drive, use machinery, or do anything that  needs mental alertness until you know how this medication affects you. What side effects  may I notice from receiving this medication? Side effects that you should report to your care team as soon as possible: Allergic reactions--skin rash, itching, hives, swelling of the face, lips, tongue, or throat Peptic ulcer--burning stomach pain, loss of appetite, bloating, burping, heartburn, nausea, vomiting Seizures Slow heartbeat--dizziness, feeling faint or lightheaded, confusion, trouble breathing, unusual weakness or fatigue Stomach bleeding--bloody or black, tar-like stools, vomiting blood or brown material that looks like coffee grounds Trouble passing urine Side effects that usually do not require medical attention (report these to your care team if they continue or are bothersome): Diarrhea Fatigue Loss of appetite Muscle pain or cramps Nausea Trouble sleeping This list may not describe all possible side effects. Call your doctor for medical advice about side effects. You may report side effects to FDA at 1-800-FDA-1088. Where should I keep my medication? Keep out of reach of children. Store at room temperature between 15 and 30 degrees C (59 and 86 degrees F). Throw away any unused medication after the expiration date. NOTE: This sheet is a summary. It may not cover all possible information. If you have questions about this medicine, talk to your doctor, pharmacist, or health care provider.  2024 Elsevier/Gold Standard (2021-07-12 00:00:00)

## 2023-05-31 ENCOUNTER — Encounter: Payer: Self-pay | Admitting: Hematology

## 2023-05-31 LAB — COMPREHENSIVE METABOLIC PANEL
ALT: 19 IU/L (ref 0–32)
AST: 26 IU/L (ref 0–40)
Albumin: 3.9 g/dL (ref 3.8–4.8)
Alkaline Phosphatase: 139 IU/L — ABNORMAL HIGH (ref 44–121)
BUN/Creatinine Ratio: 23 (ref 12–28)
BUN: 29 mg/dL — ABNORMAL HIGH (ref 8–27)
Bilirubin Total: 1.7 mg/dL — ABNORMAL HIGH (ref 0.0–1.2)
CO2: 24 mmol/L (ref 20–29)
Calcium: 9.1 mg/dL (ref 8.7–10.3)
Chloride: 103 mmol/L (ref 96–106)
Creatinine, Ser: 1.24 mg/dL — ABNORMAL HIGH (ref 0.57–1.00)
Globulin, Total: 2.5 g/dL (ref 1.5–4.5)
Glucose: 123 mg/dL — ABNORMAL HIGH (ref 70–99)
Potassium: 4.3 mmol/L (ref 3.5–5.2)
Sodium: 140 mmol/L (ref 134–144)
Total Protein: 6.4 g/dL (ref 6.0–8.5)
eGFR: 45 mL/min/{1.73_m2} — ABNORMAL LOW (ref >59)

## 2023-05-31 LAB — URINALYSIS, ROUTINE W REFLEX MICROSCOPIC
Bilirubin, UA: NEGATIVE
Glucose, UA: NEGATIVE
Ketones, UA: NEGATIVE
Leukocytes,UA: NEGATIVE
Nitrite, UA: NEGATIVE
RBC, UA: NEGATIVE
Specific Gravity, UA: 1.019 (ref 1.005–1.030)
Urobilinogen, Ur: 0.2 mg/dL (ref 0.2–1.0)
pH, UA: 6.5 (ref 5.0–7.5)

## 2023-05-31 LAB — MICROSCOPIC EXAMINATION
Bacteria, UA: NONE SEEN
Casts: NONE SEEN /lpf
RBC, Urine: NONE SEEN /hpf (ref 0–2)
WBC, UA: NONE SEEN /hpf (ref 0–5)

## 2023-05-31 LAB — CBC WITH DIFFERENTIAL/PLATELET
Basophils Absolute: 0 10*3/uL (ref 0.0–0.2)
Basos: 0 %
EOS (ABSOLUTE): 0.1 10*3/uL (ref 0.0–0.4)
Eos: 1 %
Hematocrit: 36.8 % (ref 34.0–46.6)
Hemoglobin: 12.7 g/dL (ref 11.1–15.9)
Immature Grans (Abs): 0 10*3/uL (ref 0.0–0.1)
Immature Granulocytes: 0 %
Lymphocytes Absolute: 0.9 10*3/uL (ref 0.7–3.1)
Lymphs: 10 %
MCH: 32.6 pg (ref 26.6–33.0)
MCHC: 34.5 g/dL (ref 31.5–35.7)
MCV: 95 fL (ref 79–97)
Monocytes Absolute: 0.2 10*3/uL (ref 0.1–0.9)
Monocytes: 3 %
Neutrophils Absolute: 7.1 10*3/uL — ABNORMAL HIGH (ref 1.4–7.0)
Neutrophils: 86 %
Platelets: 228 10*3/uL (ref 150–450)
RBC: 3.89 x10E6/uL (ref 3.77–5.28)
RDW: 11.7 % (ref 11.7–15.4)
WBC: 8.3 10*3/uL (ref 3.4–10.8)

## 2023-05-31 LAB — TSH RFX ON ABNORMAL TO FREE T4: TSH: 1.37 u[IU]/mL (ref 0.450–4.500)

## 2023-06-01 LAB — URINE CULTURE

## 2023-06-04 ENCOUNTER — Encounter: Payer: Medicare PPO | Admitting: Dietician

## 2023-06-04 ENCOUNTER — Telehealth: Payer: Self-pay | Admitting: Neurology

## 2023-06-04 NOTE — Telephone Encounter (Signed)
Ethlyn Gallery: [id number] exp. 06/04/23-07/04/23 sent to GI [phone]

## 2023-06-06 ENCOUNTER — Encounter: Payer: Medicare PPO | Admitting: Psychology

## 2023-06-09 NOTE — Progress Notes (Unsigned)
[facility] OFFICE PROGRESS NOTE  [name], MD [address] [place] [address]  DIAGNOSIS:  f/u of Pancreatic Cancer   Oncology History Overview Note   Cancer Staging  Pancreatic cancer [facility]) Staging form: Exocrine Pancreas, AJCC 8th Edition - Clinical: Stage IB (cT2, cN0, cM0) - Signed by Kwynn Schlotter L, PA-C on 04/12/2023 Total positive nodes: 0     Pancreatic cancer (HCC)  03/27/2023 Imaging   CT ABDOMEN PELVIS W CONTRAST   IMPRESSION: Low-attenuation mass centered along the pancreatic head with involvement of the distal common duct with the associated biliary duct ductal dilatation and pancreatic atrophy. Changes are worrisome for neoplasm either a pancreatic neoplasm or cholangiocarcinoma. Recommend further evaluation.   No liver metastases. No pathologically enlarged upper abdominal nodes.   Diffuse colonic stool.  Normal appendix.  Few diverticula.   Portions of the pelvis are obscured by the streak artifact from the bilateral hip arthroplasties.   03/29/2023 Imaging   DG ERCP   IMPRESSION: Intraoperative fluoroscopic images show dilated intra or extrahepatic bile ducts with high-grade stenosis at the level of the distal CBD. Biliary tree is decompressed following placement of common bile duct stent.   04/08/2023 Pathology Results   CYTOLOGY - NON PAP  CASE: [id number]  PATIENT: Tammee Raben  Non-Gynecological Cytology Report   FINAL MICROSCOPIC DIAGNOSIS:  A. PANCREAS, HEAD, FINE NEEDLE ASPIRATION:  - Malignant cells present (see Comment)      04/08/2023 Procedure   EUS by Dr. Meridee Score   EUS impression  - A mass was identified in the pancreatic head/ genu of the pancreas. Cytology results are pending. However, the endosonographic appearance is highly suspicious for adenocarcinoma. This was staged T2 N0 Mx by endosonographic criteria. The staging applies if malignancy is confirmed. Fine needle biopsy  performed.  - There was dilation in the common bile duct and in the common hepatic duct which measured up to 8 mm.  - One stent was visualized endosonographically within the common bile duct.  - Hyperechoic material consistent with sludge was visualized endosonographically in the gallbladder.  - Two enlarged lymph nodes were visualized in the celiac region ( level 20) and porta hepatis region. Tissue has not been obtained. However, the endosonographic appearance is suggestive of benign inflammatory change based on the visualization endosonographically.   04/12/2023 Initial Diagnosis   Pancreatic cancer (HCC)   04/12/2023 Cancer Staging   Staging form: Exocrine Pancreas, AJCC 8th Edition - Clinical: Stage IB (cT2, cN0, cM0) - Signed by Leland Staszewski L, PA-C on 04/12/2023 Total positive nodes: 0   04/12/2023 Tumor Marker   Patient's tumor was tested for the following markers: CA 19.9. Results of the tumor marker test revealed 512.   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  Report date is 05/03/2023.   The CancerNext-Expanded gene panel offered by [facility] and includes sequencing, rearrangement, and RNA analysis for the following 71 genes:  AIP, ALK, APC, ATM, BAP1, BARD1, BMPR1A, BRCA1, BRCA2, BRIP1, CDC73, CDH1, CDK4, CDKN1B, CDKN2A, CHEK2, DICER1, FH, FLCN, KIF1B, LZTR1,MAX, MEN1, MET, MLH1, MSH2, MSH6, MUTYH, NF1, NF2, NTHL1, PALB2, PHOX2B, PMS2, POT1, PRKAR1A, PTCH1, PTEN, RAD51C,RAD51D, RB1, RET, SDHA, SDHAF2, SDHB, SDHC, SDHD, SMAD4, SMARCA4, SMARCB1, SMARCE1, STK11, SUFU, TMEM127, TP53,TSC1, TSC2 and VHL (sequencing and deletion/duplication); AXIN2, CTNNA1, EGFR, EGLN1, HOXB13, KIT, MITF, MSH3, PDGFRA, POLD1 and POLE (sequencing only); EPCAM and GREM1 (deletion/duplication only).     05/27/2023 -  Chemotherapy   Patient is on  Treatment Plan : PANCREATIC Abraxane D1,8,15 + Gemcitabine D1,8,15 q28d        CURRENT THERAPY:  Pancreatic Abraxane D1,8,15 + Gemcitabine D1,8,15, q28d   INTERVAL HISTORY: LIBERA MALUEG 77 y.o. female returns to the clinic for a follow up visit accompanied by her friend. The patient is feeling well today without any concerning complaints. The patient underwent her first cycle of treatment with abraxane and gemzar with 20% dose reduction, and she tolerated treatment  well without any adverse effects. Per Dr. Latanya Maudlin last note, if she tolerates her first cycle well, she will increase the dose of chemotherapy to full dose. Denies any fever, chills, night sweats, or weight loss. She does have a persistently diminished appetite but she states she is working on it. She gained a few pounds. Her support member mentioned that she endorsed chills one night after treatment but it had resolved the following day. She did not take her temperature. They are asking about when an appropriate time frame for the RSV vaccine would be.  Denies any chest pain, shortness of breath, cough, or hemoptysis. Denies any nausea, vomiting, or diarrhea. She struggles with chronic constipation and takes laxatives and stool softener if needed. She also had been taking a fiber supplement. Denies jaundice. Denies abdominal pain. She is scheduled to see a member of the nutritionist team while in the infusion room today. Denies any rashes or skin changes.  She mentions that it is normal for her to have some ankle swelling that comes and goes in the summertime that occurs towards the to the day after being on her feet a lot but improves with elevation in the morning.  Denies any calf pain or erythema.  The patient is here today for evaluation prior to starting cycle # 1 day 15.    MEDICAL HISTORY: Past Medical History:  Diagnosis Date   Chronic kidney disease    stage 3    Chronic sphenoidal sinusitis    Degenerative arthritis of hip    requirde surgery 2009   Depression 2010   Diplopia 2011   in left lateral gaze    Diplopia    "  my eyes dont track together, i have corrective prisms in my eye glasses"    Diverticulosis 2006   noted in a colonoscopy   Esophageal reflux 2013   Hypertension    IGT (impaired glucose tolerance) 2011   denies hx of diabetes    Insomnia 2009   Medical history non-contributory    Mixed hyperlipidemia 2000   Osteoarthritis    Osteopenia 2005   Other and unspecified hyperlipidemia 2000   Other specified disease of sebaceous glands    Palpitations 2009   secondary to PSVT   Parkinson disease    PNA (pneumonia) 2014   Psoriasis 2004   PSVT (paroxysmal supraventricular tachycardia) 2009   "report it has been some time since i had that "    Situational stress    Urinary tract infection    thru wed 05/15/23, completed 3d antibiotics   Venous insufficiency since 2017    ALLERGIES:  is allergic to erythromycin, macrobid [nitrofurantoin], keflex [cephalexin], levofloxacin, penicillins, and sulfamethoxazole-trimethoprim.  MEDICATIONS:  Current Outpatient Medications  Medication Sig Dispense Refill   ALPRAZolam (XANAX) 0.5 MG tablet Take 0.25 mg by mouth as needed for anxiety. Takes as needed for sleep     AUVELITY 45-105 MG TBCR Take 1 tablet by mouth every evening.     buPROPion (WELLBUTRIN XL) 150 MG 24  hr tablet Take 150 mg by mouth daily.     Calcium Carbonate Antacid (CALCIUM CARBONATE PO) Take by mouth.     carbidopa-levodopa (SINEMET IR) 25-100 MG tablet Take 1.5 tablets by mouth every 4 (four) hours as needed. -Try to separate Sinemet from food (especially protein-rich foods like meat, dairy, eggs) by about 30-60 mins - this will help the absorption of the medication. If you have some nausea with the medication, you can take it with some light food like crackers or ginger ale 540 tablet 4   donepezil (ARICEPT) 5 MG tablet Take 1 tablet (5 mg total) by mouth at bedtime. 30 tablet 0   lidocaine-prilocaine (EMLA) cream Apply to affected area once 30 g 3   losartan (COZAAR) 50 MG tablet  Take 1 tablet (50 mg total) by mouth daily. 90 tablet 1   Multiple Vitamin (MULTIVITAMIN WITH MINERALS) TABS tablet Take 1 tablet by mouth daily. Women's Multivitamin     OVER THE COUNTER MEDICATION Take 1 tablet by mouth daily as needed (constipation).     pantoprazole (PROTONIX) 40 MG tablet Take 40 mg by mouth daily.     Polyethyl Glycol-Propyl Glycol (SYSTANE) 0.4-0.3 % SOLN Place 1 drop into both eyes 3 (three) times daily as needed (dry/irritated eyes.).     polyethylene glycol (MIRALAX / GLYCOLAX) 17 g packet Take 17 g by mouth daily.     pravastatin (PRAVACHOL) 80 MG tablet Take 1 tablet (80 mg total) by mouth every evening.     prochlorperazine (COMPAZINE) 10 MG tablet Take 1 tablet (10 mg total) by mouth every 6 (six) hours as needed for nausea or vomiting. 30 tablet 1   promethazine (PHENERGAN) 25 MG tablet Take 25 mg by mouth every 6 (six) hours as needed for nausea or vomiting.     triamcinolone (NASACORT) 55 MCG/ACT AERO nasal inhaler Place 2 sprays into the nose daily. Use at night (Patient taking differently: Place 1 spray into the nose daily. Use at night) 1 each 12   No current facility-administered medications for this visit.    SURGICAL HISTORY:  Past Surgical History:  Procedure Laterality Date   BILIARY BRUSHING  03/29/2023   Procedure: BILIARY BRUSHING;  Surgeon: Hilarie Fredrickson, MD;  Location: [facility] ENDOSCOPY;  Service: Gastroenterology;;   BILIARY STENT PLACEMENT  03/29/2023   Procedure: BILIARY STENT PLACEMENT;  Surgeon: Hilarie Fredrickson, MD;  Location: [facility] ENDOSCOPY;  Service: Gastroenterology;;   BIOPSY  04/08/2023   Procedure: BIOPSY;  Surgeon: Lemar Lofty., MD;  Location: [facility] ENDOSCOPY;  Service: Gastroenterology;;   CATARACT EXTRACTION W/ INTRAOCULAR LENS  IMPLANT, BILATERAL     COLONOSCOPY  01/30/2010   Jarold Motto   ERCP N/A 03/29/2023   Procedure: ENDOSCOPIC RETROGRADE CHOLANGIOPANCREATOGRAPHY (ERCP);  Surgeon: Hilarie Fredrickson, MD;  Location: [facility] ENDOSCOPY;   Service: Gastroenterology;  Laterality: N/A;   ESOPHAGOGASTRODUODENOSCOPY (EGD) WITH PROPOFOL N/A 04/08/2023   Procedure: ESOPHAGOGASTRODUODENOSCOPY (EGD) WITH PROPOFOL;  Surgeon: Meridee Score Netty Starring., MD;  Location: [facility] ENDOSCOPY;  Service: Gastroenterology;  Laterality: N/A;   ETHMOIDECTOMY Left 11/17/2020   Procedure: LEFT SIDED TOTAL ETHMOIDECTOMY;  Surgeon: Drema Halon, MD;  Location: [facility];  Service: ENT;  Laterality: Left;   EUS N/A 04/08/2023   Procedure: UPPER ENDOSCOPIC ULTRASOUND (EUS) LINEAR;  Surgeon: Lemar Lofty., MD;  Location: [facility] ENDOSCOPY;  Service: Gastroenterology;  Laterality: N/A;   EYE SURGERY     FINE NEEDLE ASPIRATION N/A 04/08/2023   Procedure: FINE NEEDLE ASPIRATION (FNA) LINEAR;  Surgeon: Lemar Lofty., MD;  Location: [facility] ENDOSCOPY;  Service: Gastroenterology;  Laterality: N/A;   HEMORRHOID SURGERY     IR IMAGING GUIDED PORT INSERTION  05/17/2023   JOINT REPLACEMENT     LASIK     left medial and superior rectus muscle recesion for diplopia correction     SINUS ENDO WITH FUSION Left 11/17/2020   Procedure: SINUS ENDOSCOPY WITH FUSION NAVIGATION;  Surgeon: Drema Halon, MD;  Location: [facility];  Service: ENT;  Laterality: Left;   SPHENOIDECTOMY Left 11/17/2020   Procedure: LEFT SIDED SPHENOIDECTOMY WITH TISSUE REMOVAL;  Surgeon: Drema Halon, MD;  Location: [facility];  Service: ENT;  Laterality: Left;   SPHINCTEROTOMY  03/29/2023   Procedure: SPHINCTEROTOMY;  Surgeon: Hilarie Fredrickson, MD;  Location: [facility] ENDOSCOPY;  Service: Gastroenterology;;   surgery of right eye rectus muscles for diplpia correction   2019   TOTAL HIP ARTHROPLASTY Right 2010   TOTAL HIP ARTHROPLASTY Left 08/19/2019   Procedure: TOTAL HIP ARTHROPLASTY ANTERIOR APPROACH;  Surgeon: Ollen Gross, MD;  Location: [facility] ORS;  Service: Orthopedics;  Laterality: Left;    TURBINATE REDUCTION Left 11/17/2020    Procedure: LEFT SIDED TURBINATE REDUCTION;  Surgeon: Drema Halon, MD;  Location:  SURGERY CENTER;  Service: ENT;  Laterality: Left;   URETHRAL SLING     midurethral sling with TVT Exact and Cystoscopy    REVIEW OF SYSTEMS:   Review of Systems  Constitutional: Positive for stable diminished appetite.  Negative for appetite change, chills, fatigue, and fever.  HENT: Negative for mouth sores, nosebleeds, sore throat and trouble swallowing.   Eyes: Negative for eye problems and icterus.  Respiratory: Negative for cough, hemoptysis, shortness of breath and wheezing.   Cardiovascular: Negative for chest pain and leg swelling.  Gastrointestinal: Negative for abdominal pain, constipation, diarrhea, nausea and vomiting.  Genitourinary: Negative for bladder incontinence, difficulty urinating, dysuria, frequency and hematuria.   Musculoskeletal: Negative for back pain, gait problem, neck pain and neck stiffness.  Skin: Negative for itching and rash.  Neurological: Negative for dizziness, extremity weakness, gait problem, headaches, light-headedness and seizures.  Hematological: Negative for adenopathy. Does not bruise/bleed easily.  Psychiatric/Behavioral: Negative for confusion, depression and sleep disturbance. The patient is not nervous/anxious.     PHYSICAL EXAMINATION:  There were no vitals taken for this visit.  ECOG PERFORMANCE STATUS: 1  Physical Exam  Constitutional: Oriented to person, place, and time and well-developed, well-nourished, and in no distress.  HENT:  Head: Normocephalic and atraumatic.  Mouth/Throat: Oropharynx is clear and moist. No oropharyngeal exudate.  Eyes: Conjunctivae are normal. Right eye exhibits no discharge. Left eye exhibits no discharge. No scleral icterus.  Neck: Normal range of motion. Neck supple.  Cardiovascular: Normal rate, regular rhythm, normal heart sounds and intact distal pulses.   Pulmonary/Chest: Effort normal and breath  sounds normal. No respiratory distress. No wheezes. No rales.  Abdominal: Soft. Bowel sounds are normal. Exhibits no distension and no mass. There is no tenderness.  Musculoskeletal: Normal range of motion. Positive for mild ankle swelling (stable).  Lymphadenopathy:    No cervical adenopathy.  Neurological: Alert and oriented to person, place, and time. Exhibits normal muscle tone. Gait normal. Coordination normal.  Skin: Skin is warm and dry. No rash noted. Not diaphoretic. No erythema. No pallor.  Psychiatric: Mood, memory and judgment normal.  Vitals reviewed.  LABORATORY DATA: Lab Results  Component Value Date   WBC 8.3 05/30/2023  HGB 12.7 05/30/2023   HCT 36.8 05/30/2023   MCV 95 05/30/2023   PLT 228 05/30/2023      Chemistry      Component Value Date/Time   NA 140 05/30/2023 1316   K 4.3 05/30/2023 1316   CL 103 05/30/2023 1316   CO2 24 05/30/2023 1316   BUN 29 (H) 05/30/2023 1316   CREATININE 1.24 (H) 05/30/2023 1316   CREATININE 0.89 05/27/2023 1249      Component Value Date/Time   CALCIUM 9.1 05/30/2023 1316   ALKPHOS 139 (H) 05/30/2023 1316   AST 26 05/30/2023 1316   AST 19 05/27/2023 1249   ALT 19 05/30/2023 1316   ALT 10 05/27/2023 1249   BILITOT 1.7 (H) 05/30/2023 1316   BILITOT 1.0 05/27/2023 1249       RADIOGRAPHIC STUDIES:  IR IMAGING GUIDED PORT INSERTION  Result Date: 05/17/2023 INDICATION: 77 year old woman with pancreatic malignancy presents to IR for chest port placement EXAM: IMPLANTED PORT A CATH PLACEMENT WITH ULTRASOUND AND FLUOROSCOPIC GUIDANCE MEDICATIONS: Ancef 2 g IV; The antibiotic was administered within an appropriate time interval prior to skin puncture. ANESTHESIA/SEDATION: Moderate (conscious) sedation was employed during this procedure. A total of Versed 2 mg and Fentanyl 100 mcg was administered intravenously by the radiology nurse. Total intra-service moderate Sedation Time: 17 minutes. The patient's level of consciousness and  vital signs were monitored continuously by radiology nursing throughout the procedure under my direct supervision. FLUOROSCOPY: Radiation Exposure Index (as provided by the fluoroscopic device): 1 mGy Kerma COMPLICATIONS: None immediate. PROCEDURE: The procedure, risks, benefits, and alternatives were explained to the patient. Questions regarding the procedure were encouraged and answered. The patient understands and consents to the procedure. A timeout was performed prior to the initiation of the procedure. Patient positioned supine on the angiography table. Right neck and anterior upper chest prepped and draped in the usual sterile fashion. All elements of maximal sterile barrier were utilized including, cap, mask, sterile gown, sterile gloves, large sterile drape, hand scrubbing and 2% Chlorhexidine for skin cleaning. The right internal jugular vein was evaluated with ultrasound and shown to be patent. A permanent ultrasound image was obtained and placed in the patient's medical record. Local anesthesia was provided with 1% lidocaine with epinephrine. Using sterile gel and a sterile probe cover, the right internal jugular vein was entered with a 21 ga needle during real time ultrasound guidance. 0.018 inch guidewire placed and 21 ga needle exchanged for transitional dilator set. Utilizing fluoroscopy, 0.035 inch guidewire advanced centrally without difficulty. Attention then turned to the right anterior upper chest. Following local lidocaine administration, a port pocket was created. The catheter was connected to the port and brought from the pocket to the venotomy site through a subcutaneous tunnel. The catheter was cut to size and inserted through the peel-away sheath. The catheter tip was positioned at the cavoatrial junction using fluoroscopic guidance. The port aspirated and flushed well. The port pocket was closed with deep and superficial absorbable suture. The port pocket incision and venotomy sites were  also sealed with Dermabond. IMPRESSION: Successful placement of a right internal jugular approach power injectable Port-A-Cath. The catheter is ready for immediate use. Electronically Signed   By: Acquanetta Belling M.D.   On: 05/17/2023 15:57     ASSESSMENT/PLAN:  MILDERD OZANICH is a 77 y.o. female with    Pancreatic cancer (HCC) stage IB (T2, N0, Mx), diagnosed in April 2024 -Presented to the emergency room on 4/17 with jaundice and dark  urine -Baseline bilirubin elevated at 8.6 -CT scan showed pancreatic head lesion with involvement of the distal CBD and associated biliary ductal dilation.  No evidence of lymphadenopathy or metastatic disease to the liver -Had ERCP and stent placement on 4/19 -Underwent EUS by Dr. Meridee Score on 04/08/23 showing 30 x 23 mm pancreatic head lesion without any vascular involvement.  -The final pathology showed pancreatic adenocarcinoma -She has met pancreatobiliary surgeon Dr. Donell Beers, and she declined the surgery.  Patient understands that her cancer is not curable without surgery. -We discussed the natural course of early stage pancreatic cancer, and the benefit of treatment to prolong her life and improve her quality life. -Dr. [name] previously encouraged her to consider chemotherapy for 3 to 6 months, with consolidation radiation -Patient has decided to start chemotherapy.  She is currently doing well and tolerated her first dose well with 20% dose reduction. Per Dr. Latanya Maudlin last note, if she does well, she will increase her dose to full dose today if she tolerated her first cycle well.  -We will proceed with full dose treatment today as scheduled.  Labs were reviewed. -The patient would be interested in a second antiemetic to have on hand if needed.  She tolerated the first cycle well with the 20% dose reduction.  She is hopeful that she continues to tolerate well.  I sent her prescription for Zofran every 8 hours as needed for nausea and vomiting to alternate with  compazine should she have any nausea/vomiting with this cycle.  -The patient was asking timing for RSV vaccine.  I would recommend getting the vaccine the middle of her cycle but recommended she discuss with Dr. [name] at her next appointment to see her stance.  -Scheduled to see member of the nutritionist today -She will return in 2 weeks for for her next cycle     PLAN: -lab reviewed -Scheduled to see nutrition today -proceed with D15,C1 gemzar/abraxane, with full dose chemotherapy. She had 20% dose reduction with her last appointment.  -pt will receive treatment every other week -lab\flush and f/u  and treatment 7/15 -Sent Rx for zofran  -Patient mentioned she has intermittent leg swelling during the summer better in AM worse in PM. Encouraged elevation and compression stockings if she will be on her feet a lot. Of course, reviewed should she ever develop erythema or calf pain, she would need to be evaluated.    No orders of the defined types were placed in this encounter.   The total time spent in the appointment was 20-29 minutes  Tacori Kvamme L Tashiba Timoney, PA-C 06/09/23

## 2023-06-10 ENCOUNTER — Ambulatory Visit: Payer: Medicare PPO

## 2023-06-10 ENCOUNTER — Other Ambulatory Visit: Payer: Medicare PPO

## 2023-06-10 ENCOUNTER — Ambulatory Visit: Payer: Medicare PPO | Admitting: Physician Assistant

## 2023-06-10 NOTE — Addendum Note (Signed)
Encounter addended by: Edward Qualia on: 06/10/2023 12:21 PM  Actions taken: Imaging Exam ended

## 2023-06-10 NOTE — Addendum Note (Signed)
Encounter addended by: Adaly Puder C on: 06/10/2023 12:21 PM  Actions taken: Imaging Exam ended

## 2023-06-11 ENCOUNTER — Other Ambulatory Visit: Payer: Self-pay

## 2023-06-11 ENCOUNTER — Inpatient Hospital Stay: Payer: Medicare PPO | Admitting: Nutrition

## 2023-06-11 ENCOUNTER — Inpatient Hospital Stay: Payer: Medicare PPO | Attending: Physician Assistant

## 2023-06-11 ENCOUNTER — Inpatient Hospital Stay: Payer: Medicare PPO | Admitting: Physician Assistant

## 2023-06-11 ENCOUNTER — Inpatient Hospital Stay: Payer: Medicare PPO

## 2023-06-11 VITALS — BP 167/84 | HR 77 | Temp 98.1°F | Resp 14 | Wt 149.3 lb

## 2023-06-11 VITALS — BP 169/83 | HR 74 | Temp 98.2°F | Resp 18

## 2023-06-11 DIAGNOSIS — C250 Malignant neoplasm of head of pancreas: Secondary | ICD-10-CM | POA: Insufficient documentation

## 2023-06-11 DIAGNOSIS — Z5111 Encounter for antineoplastic chemotherapy: Secondary | ICD-10-CM | POA: Insufficient documentation

## 2023-06-11 DIAGNOSIS — Z79899 Other long term (current) drug therapy: Secondary | ICD-10-CM | POA: Insufficient documentation

## 2023-06-11 DIAGNOSIS — Z95828 Presence of other vascular implants and grafts: Secondary | ICD-10-CM

## 2023-06-11 LAB — CBC WITH DIFFERENTIAL (CANCER CENTER ONLY)
Abs Immature Granulocytes: 0.02 10*3/uL (ref 0.00–0.07)
Basophils Absolute: 0 10*3/uL (ref 0.0–0.1)
Basophils Relative: 1 %
Eosinophils Absolute: 0.2 10*3/uL (ref 0.0–0.5)
Eosinophils Relative: 4 %
HCT: 34.6 % — ABNORMAL LOW (ref 36.0–46.0)
Hemoglobin: 12 g/dL (ref 12.0–15.0)
Immature Granulocytes: 0 %
Lymphocytes Relative: 32 %
Lymphs Abs: 1.4 10*3/uL (ref 0.7–4.0)
MCH: 33.1 pg (ref 26.0–34.0)
MCHC: 34.7 g/dL (ref 30.0–36.0)
MCV: 95.6 fL (ref 80.0–100.0)
Monocytes Absolute: 0.9 10*3/uL (ref 0.1–1.0)
Monocytes Relative: 20 %
Neutro Abs: 2 10*3/uL (ref 1.7–7.7)
Neutrophils Relative %: 43 %
Platelet Count: 285 10*3/uL (ref 150–400)
RBC: 3.62 MIL/uL — ABNORMAL LOW (ref 3.87–5.11)
RDW: 12.3 % (ref 11.5–15.5)
WBC Count: 4.6 10*3/uL (ref 4.0–10.5)
nRBC: 0 % (ref 0.0–0.2)

## 2023-06-11 LAB — CMP (CANCER CENTER ONLY)
ALT: 14 U/L (ref 0–44)
AST: 14 U/L — ABNORMAL LOW (ref 15–41)
Albumin: 3.5 g/dL (ref 3.5–5.0)
Alkaline Phosphatase: 89 U/L (ref 38–126)
Anion gap: 6 (ref 5–15)
BUN: 19 mg/dL (ref 8–23)
CO2: 25 mmol/L (ref 22–32)
Calcium: 8.6 mg/dL — ABNORMAL LOW (ref 8.9–10.3)
Chloride: 108 mmol/L (ref 98–111)
Creatinine: 1 mg/dL (ref 0.44–1.00)
GFR, Estimated: 58 mL/min — ABNORMAL LOW (ref >60)
Glucose, Bld: 129 mg/dL — ABNORMAL HIGH (ref 70–99)
Potassium: 3.8 mmol/L (ref 3.5–5.1)
Sodium: 139 mmol/L (ref 135–145)
Total Bilirubin: 0.6 mg/dL (ref 0.3–1.2)
Total Protein: 6.1 g/dL — ABNORMAL LOW (ref 6.5–8.1)

## 2023-06-11 MED ORDER — SODIUM CHLORIDE 0.9% FLUSH
10.0000 mL | INTRAVENOUS | Status: DC | PRN
  Administered 2023-06-11: 10 mL

## 2023-06-11 MED ORDER — HEPARIN SOD (PORK) LOCK FLUSH 100 UNIT/ML IV SOLN
500.0000 [IU] | Freq: Once | INTRAVENOUS | Status: AC | PRN
  Administered 2023-06-11: 500 [IU]

## 2023-06-11 MED ORDER — SODIUM CHLORIDE 0.9 % IV SOLN: Freq: Once | INTRAVENOUS | Status: AC

## 2023-06-11 MED ORDER — SODIUM CHLORIDE 0.9 % IV SOLN
1000.0000 mg/m2 | Freq: Once | INTRAVENOUS | Status: DC
  Filled 2023-06-11: qty 46

## 2023-06-11 MED ORDER — PACLITAXEL PROTEIN-BOUND CHEMO INJECTION 100 MG
125.0000 mg/m2 | Freq: Once | INTRAVENOUS | Status: AC
  Administered 2023-06-11: 200 mg via INTRAVENOUS
  Filled 2023-06-11: qty 40

## 2023-06-11 MED ORDER — ONDANSETRON HCL 8 MG PO TABS
8.0000 mg | ORAL_TABLET | Freq: Three times a day (TID) | ORAL | 2 refills | Status: DC | PRN
Start: 2023-06-11 — End: 2023-07-08

## 2023-06-11 MED ORDER — PROCHLORPERAZINE MALEATE 10 MG PO TABS
10.0000 mg | ORAL_TABLET | Freq: Once | ORAL | Status: AC
  Administered 2023-06-11: 10 mg via ORAL
  Filled 2023-06-11: qty 1

## 2023-06-11 MED ORDER — SODIUM CHLORIDE 0.9 % IV SOLN
1000.0000 mg/m2 | Freq: Once | INTRAVENOUS | Status: AC
  Administered 2023-06-11: 1748 mg via INTRAVENOUS
  Filled 2023-06-11: qty 45.97

## 2023-06-11 NOTE — Patient Instructions (Signed)
[facility]  Discharge Instructions: Thank you for choosing [facility] to provide your oncology and hematology care.   If you have a lab appointment with the Cancer Center, please go directly to the Cancer Center and check in at the registration area.   Wear comfortable clothing and clothing appropriate for easy access to any Portacath or PICC line.   We strive to give you quality time with your provider. You may need to reschedule your appointment if you arrive late (15 or more minutes).  Arriving late affects you and other patients whose appointments are after yours.  Also, if you miss three or more appointments without notifying the office, you may be dismissed from the clinic at the provider's discretion.      For prescription refill requests, have your pharmacy contact our office and allow 72 hours for refills to be completed.    Today you received the following chemotherapy and/or immunotherapy agents abraxane, gemcitabine      To help prevent nausea and vomiting after your treatment, we encourage you to take your nausea medication as directed.  BELOW ARE SYMPTOMS THAT SHOULD BE REPORTED IMMEDIATELY: *FEVER GREATER THAN 100.4 F (38 C) OR HIGHER *CHILLS OR SWEATING *NAUSEA AND VOMITING THAT IS NOT CONTROLLED WITH YOUR NAUSEA MEDICATION *UNUSUAL SHORTNESS OF BREATH *UNUSUAL BRUISING OR BLEEDING *URINARY PROBLEMS (pain or burning when urinating, or frequent urination) *BOWEL PROBLEMS (unusual diarrhea, constipation, pain near the anus) TENDERNESS IN MOUTH AND THROAT WITH OR WITHOUT PRESENCE OF ULCERS (sore throat, sores in mouth, or a toothache) UNUSUAL RASH, SWELLING OR PAIN  UNUSUAL VAGINAL DISCHARGE OR ITCHING   Items with * indicate a potential emergency and should be followed up as soon as possible or go to the Emergency Department if any problems should occur.  Please show the CHEMOTHERAPY ALERT CARD or IMMUNOTHERAPY ALERT  CARD at check-in to the Emergency Department and triage nurse.  Should you have questions after your visit or need to cancel or reschedule your appointment, please contact [facility]  Dept: [phone]  and follow the prompts.  Office hours are 8:00 a.m. to 4:30 p.m. Monday - Friday. Please note that voicemails left after 4:00 p.m. may not be returned until the following business day.  We are closed weekends and major holidays. You have access to a nurse at all times for urgent questions. Please call the main number to the clinic Dept: [phone] and follow the prompts.   For any non-urgent questions, you may also contact your provider using MyChart. We now offer e-Visits for anyone 18 and older to request care online for non-urgent symptoms. For details visit [email or web address].   Also download the MyChart app! Go to the app store, search "MyChart", open the app, select [place], and log in with your MyChart username and password.   

## 2023-06-11 NOTE — Progress Notes (Signed)
Spoke w/ Cassie, Dr [name] would like to give full dose of gem/abraxane since patient tolerate previous reduced doses.   Demetrius Charity, PharmD

## 2023-06-11 NOTE — Progress Notes (Signed)
Nutrition follow-up completed with patient during infusion for pancreas cancer.  Weight increased and was documented as 149 pounds 4.8 ounces on July 2.  This is increased from 143 pounds 8 ounces May 13.  Labs include glucose 129.  Estimated nutrition needs: 1625-1950 cal, 81-98 g protein, 1625-1950 mL free water.  I met with patient and her friend during infusion.  Patient reports she is much better.  She reports she has been drinking 1 Ensure Plus or equivalent every day in the morning.  States she is a slow eater but she is eating frequently.  She had nausea 1 time and took antiemetics which improved nausea.  Reports constipation continues and is a chronic problem.  She takes MiraLAX and a probiotic.  She drinks between 2 and 324 ounce bottles of water daily.  She requests additional coupons.  Nutrition diagnosis: Inadequate oral intake improved.  Intervention: Educated patient to continue strategies for increased calorie and protein intake to minimize weight loss during treatment. Reviewed bowel regimen and encouraged patient to drink additional water. Continue Ensure Plus or equivalent once daily.  Provided additional coupons. Provided contact information for questions.  Monitoring, evaluation, goals: Patient will tolerate adequate calories and protein to minimize weight loss throughout treatment.  Next visit: Monday, July 29 during infusion.  **Disclaimer: This note was dictated with voice recognition software. Similar sounding words can inadvertently be transcribed and this note may contain transcription errors which may not have been corrected upon publication of note.**

## 2023-06-18 ENCOUNTER — Ambulatory Visit
Admission: RE | Admit: 2023-06-18 | Discharge: 2023-06-18 | Disposition: A | Discharge status: Home / Self Care | Payer: Medicare PPO | Source: Ambulatory Visit | Attending: Neurology | Admitting: Neurology

## 2023-06-18 ENCOUNTER — Ambulatory Visit: Payer: Medicare PPO | Admitting: Neurology

## 2023-06-18 DIAGNOSIS — R634 Abnormal weight loss: Secondary | ICD-10-CM

## 2023-06-18 DIAGNOSIS — R269 Unspecified abnormalities of gait and mobility: Secondary | ICD-10-CM

## 2023-06-18 DIAGNOSIS — C259 Malignant neoplasm of pancreas, unspecified: Secondary | ICD-10-CM

## 2023-06-18 DIAGNOSIS — F03A3 Unspecified dementia, mild, with mood disturbance: Secondary | ICD-10-CM

## 2023-06-18 DIAGNOSIS — R4701 Aphasia: Secondary | ICD-10-CM

## 2023-06-18 DIAGNOSIS — Z5320 Procedure and treatment not carried out because of patient's decision for unspecified reasons: Secondary | ICD-10-CM

## 2023-06-18 DIAGNOSIS — R410 Disorientation, unspecified: Secondary | ICD-10-CM

## 2023-06-18 DIAGNOSIS — C7931 Secondary malignant neoplasm of brain: Secondary | ICD-10-CM

## 2023-06-18 DIAGNOSIS — R4189 Other symptoms and signs involving cognitive functions and awareness: Secondary | ICD-10-CM

## 2023-06-18 MED ORDER — GADOPICLENOL 0.5 MMOL/ML IV SOLN
6.5000 mL | Freq: Once | INTRAVENOUS | Status: AC | PRN
  Administered 2023-06-18: 6.5 mL via INTRAVENOUS

## 2023-06-23 NOTE — Assessment & Plan Note (Signed)
-  stage IB (T2, N0, Mx), diagnosed in April 2024 -Presented to the emergency room on 4/17 with jaundice and dark urine -Baseline bilirubin elevated at 8.6 -CT scan showed pancreatic head lesion with involvement of the distal CBD and associated biliary ductal dilation.  No evidence of lymphadenopathy or metastatic disease to the liver -Had ERCP and stent placement on 4/19 -Underwent EUS by Dr. Meridee Score on 04/08/23 showing 30 x 23 mm pancreatic head lesion without any vascular involvement.  -The final pathology showed pancreatic adenocarcinoma -She has met pancreatobiliary surgeon Dr. Donell Beers, and she declined the surgery.  Patient understands that her cancer is not curable without surgery. -We discussed the natural course of early stage pancreatic cancer, and the benefit of treatment to prolong her life and improve her quality life. -I encouraged her to consider chemotherapy for 3 to 6 months, with consolidation radiation -I also discussed option of radiation alone, due to her concern of potential side effect from chemotherapy. -Patient has decided to start chemotherapy.  She is currently doing well, lab reviewed, adequate for treatment, will proceed with cycle 1 gemcitabine and Abraxane today with 20% dose reduction.

## 2023-06-24 ENCOUNTER — Inpatient Hospital Stay: Payer: Medicare PPO

## 2023-06-24 ENCOUNTER — Encounter: Payer: Self-pay | Admitting: Hematology

## 2023-06-24 ENCOUNTER — Inpatient Hospital Stay: Payer: Medicare PPO | Admitting: Hematology

## 2023-06-24 VITALS — BP 140/78 | HR 75 | Temp 98.1°F | Resp 18 | Ht 66.0 in | Wt 148.3 lb

## 2023-06-24 DIAGNOSIS — C250 Malignant neoplasm of head of pancreas: Secondary | ICD-10-CM

## 2023-06-24 DIAGNOSIS — Z95828 Presence of other vascular implants and grafts: Secondary | ICD-10-CM

## 2023-06-24 DIAGNOSIS — Z5111 Encounter for antineoplastic chemotherapy: Secondary | ICD-10-CM | POA: Diagnosis not present

## 2023-06-24 LAB — CMP (CANCER CENTER ONLY)
ALT: 7 U/L (ref 0–44)
AST: 42 U/L — ABNORMAL HIGH (ref 15–41)
Albumin: 3.8 g/dL (ref 3.5–5.0)
Alkaline Phosphatase: 122 U/L (ref 38–126)
Anion gap: 5 (ref 5–15)
BUN: 27 mg/dL — ABNORMAL HIGH (ref 8–23)
CO2: 27 mmol/L (ref 22–32)
Calcium: 9 mg/dL (ref 8.9–10.3)
Chloride: 107 mmol/L (ref 98–111)
Creatinine: 0.97 mg/dL (ref 0.44–1.00)
GFR, Estimated: 60 mL/min — ABNORMAL LOW (ref >60)
Glucose, Bld: 101 mg/dL — ABNORMAL HIGH (ref 70–99)
Potassium: 3.8 mmol/L (ref 3.5–5.1)
Sodium: 139 mmol/L (ref 135–145)
Total Bilirubin: 0.6 mg/dL (ref 0.3–1.2)
Total Protein: 6.7 g/dL (ref 6.5–8.1)

## 2023-06-24 LAB — CBC WITH DIFFERENTIAL (CANCER CENTER ONLY)
Abs Immature Granulocytes: 0.01 10*3/uL (ref 0.00–0.07)
Basophils Absolute: 0 10*3/uL (ref 0.0–0.1)
Basophils Relative: 1 %
Eosinophils Absolute: 0.4 10*3/uL (ref 0.0–0.5)
Eosinophils Relative: 8 %
HCT: 33.7 % — ABNORMAL LOW (ref 36.0–46.0)
Hemoglobin: 11.5 g/dL — ABNORMAL LOW (ref 12.0–15.0)
Immature Granulocytes: 0 %
Lymphocytes Relative: 28 %
Lymphs Abs: 1.5 10*3/uL (ref 0.7–4.0)
MCH: 32.1 pg (ref 26.0–34.0)
MCHC: 34.1 g/dL (ref 30.0–36.0)
MCV: 94.1 fL (ref 80.0–100.0)
Monocytes Absolute: 1.1 10*3/uL — ABNORMAL HIGH (ref 0.1–1.0)
Monocytes Relative: 19 %
Neutro Abs: 2.4 10*3/uL (ref 1.7–7.7)
Neutrophils Relative %: 44 %
Platelet Count: 256 10*3/uL (ref 150–400)
RBC: 3.58 MIL/uL — ABNORMAL LOW (ref 3.87–5.11)
RDW: 12.3 % (ref 11.5–15.5)
WBC Count: 5.5 10*3/uL (ref 4.0–10.5)
nRBC: 0 % (ref 0.0–0.2)

## 2023-06-24 MED ORDER — PROCHLORPERAZINE MALEATE 10 MG PO TABS
10.0000 mg | ORAL_TABLET | Freq: Once | ORAL | Status: AC
  Administered 2023-06-24: 10 mg via ORAL
  Filled 2023-06-24: qty 1

## 2023-06-24 MED ORDER — PACLITAXEL PROTEIN-BOUND CHEMO INJECTION 100 MG
125.0000 mg/m2 | Freq: Once | INTRAVENOUS | Status: AC
  Administered 2023-06-24: 200 mg via INTRAVENOUS
  Filled 2023-06-24: qty 40

## 2023-06-24 MED ORDER — SODIUM CHLORIDE 0.9% FLUSH
10.0000 mL | INTRAVENOUS | Status: DC | PRN
  Administered 2023-06-24: 10 mL

## 2023-06-24 MED ORDER — HEPARIN SOD (PORK) LOCK FLUSH 100 UNIT/ML IV SOLN
500.0000 [IU] | Freq: Once | INTRAVENOUS | Status: AC | PRN
  Administered 2023-06-24: 500 [IU]

## 2023-06-24 MED ORDER — SODIUM CHLORIDE 0.9 % IV SOLN
1000.0000 mg/m2 | Freq: Once | INTRAVENOUS | Status: AC
  Administered 2023-06-24: 1748 mg via INTRAVENOUS
  Filled 2023-06-24: qty 45.97

## 2023-06-24 MED ORDER — SODIUM CHLORIDE 0.9 % IV SOLN: Freq: Once | INTRAVENOUS | Status: AC

## 2023-06-24 NOTE — Progress Notes (Signed)
[facility]   Telephone:([phone] [phone]   Clinic Follow up Note   Patient Care Team: [name], MD as PCP - General (Family Medicine) Malachy Mood, MD as Consulting Physician (Oncology)  Date of Service:  06/24/2023  CHIEF COMPLAINT: f/u of Pancreatic Cancer   CURRENT THERAPY:  Pancreatic Abraxane D1,8,15 + Gemcitabine D1,8,15, q28d   ASSESSMENT:  [facility] is a 77 y.o. female with   Pancreatic cancer (HCC) -stage IB (T2, N0, Mx), diagnosed in April 2024 -Presented to the emergency room on 4/17 with jaundice and dark urine -Baseline bilirubin elevated at 8.6 -CT scan showed pancreatic head lesion with involvement of the distal CBD and associated biliary ductal dilation.  No evidence of lymphadenopathy or metastatic disease to the liver -Had ERCP and stent placement on 4/19 -Underwent EUS by Dr. Meridee Score on 04/08/23 showing 30 x 23 mm pancreatic head lesion without any vascular involvement.  -The final pathology showed pancreatic adenocarcinoma -She has met pancreatobiliary surgeon Dr. Donell Beers, and she declined the surgery.  Patient understands that her cancer is not curable without surgery. -We discussed the natural course of early stage pancreatic cancer, and the benefit of treatment to prolong her life and improve her quality life. -I encouraged her to consider chemotherapy for 3 to 6 months, with consolidation radiation -I also discussed option of radiation alone, due to her concern of potential side effect from chemotherapy. -Patient has decided to start chemotherapy.  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, I recommend her children to be tested also    Morbidities, Parkinson's disease, CKD -She does not  have a lot of symptoms from her Parkinson's disease and states this is under control -She is currently on Sinemet -Creatinine stable     PLAN: -lab reviewed hg 11.5 -CMP-pending -proceed with D1,C2 Gemzar/Abraxane today -lab/flush and treatment 7/29  SUMMARY OF ONCOLOGIC HISTORY: Oncology History Overview Note   Cancer Staging  Pancreatic cancer [facility]) Staging form: Exocrine Pancreas, AJCC 8th Edition - Clinical: Stage IB (cT2, cN0, cM0) - Signed by Heilingoetter, Cassandra L, PA-C on 04/12/2023 Total positive nodes: 0     Pancreatic cancer (HCC)  03/27/2023 Imaging   CT ABDOMEN PELVIS W CONTRAST   IMPRESSION: Low-attenuation mass centered along the pancreatic head with involvement of the distal common duct with the associated biliary duct ductal dilatation and pancreatic atrophy. Changes are worrisome for neoplasm either a pancreatic neoplasm or cholangiocarcinoma. Recommend further evaluation.   No liver metastases. No pathologically enlarged upper abdominal nodes.   Diffuse colonic stool.  Normal appendix.  Few diverticula.   Portions of the pelvis are obscured by the streak artifact from the bilateral hip arthroplasties.   03/29/2023 Imaging   DG ERCP   IMPRESSION: Intraoperative fluoroscopic images show dilated intra or extrahepatic bile ducts with high-grade stenosis at the level of the distal CBD. Biliary tree is decompressed following placement of common bile duct stent.   04/08/2023 Pathology Results   CYTOLOGY - NON PAP  CASE: [id number]  PATIENT: Marie [facility]  Non-Gynecological Cytology Report   FINAL MICROSCOPIC DIAGNOSIS:  A. PANCREAS, HEAD, FINE NEEDLE ASPIRATION:  - Malignant cells present (see Comment)      04/08/2023 Procedure   EUS by Dr. Meridee Score   EUS impression  - A mass  was identified in the pancreatic head/ genu of the pancreas. Cytology results are pending. However, the endosonographic appearance is highly suspicious for  adenocarcinoma. This was staged T2 N0 Mx by endosonographic criteria. The staging applies if malignancy is confirmed. Fine needle biopsy performed.  - There was dilation in the common bile duct and in the common hepatic duct which measured up to 8 mm.  - One stent was visualized endosonographically within the common bile duct.  - Hyperechoic material consistent with sludge was visualized endosonographically in the gallbladder.  - Two enlarged lymph nodes were visualized in the celiac region ( level 20) and porta hepatis region. Tissue has not been obtained. However, the endosonographic appearance is suggestive of benign inflammatory change based on the visualization endosonographically.   04/12/2023 Initial Diagnosis   Pancreatic cancer (HCC)   04/12/2023 Cancer Staging   Staging form: Exocrine Pancreas, AJCC 8th Edition - Clinical: Stage IB (cT2, cN0, cM0) - Signed by Heilingoetter, Cassandra L, PA-C on 04/12/2023 Total positive nodes: 0   04/12/2023 Tumor Marker   Patient's tumor was tested for the following markers: CA 19.9. Results of the tumor marker test revealed 512.   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  Report date is 05/03/2023.   The CancerNext-Expanded gene panel offered by [facility] and includes sequencing, rearrangement, and RNA analysis for the following 71 genes:  AIP, ALK, APC, ATM, BAP1, BARD1, BMPR1A, BRCA1, BRCA2, BRIP1, CDC73, CDH1, CDK4, CDKN1B, CDKN2A, CHEK2, DICER1, FH, FLCN, KIF1B, LZTR1,MAX, MEN1, MET, MLH1, MSH2, MSH6, MUTYH, NF1, NF2, NTHL1, PALB2, PHOX2B, PMS2, POT1, PRKAR1A, PTCH1, PTEN, RAD51C,RAD51D, RB1, RET, SDHA, SDHAF2, SDHB, SDHC, SDHD, SMAD4, SMARCA4, SMARCB1, SMARCE1, STK11, SUFU, TMEM127, TP53,TSC1, TSC2 and VHL (sequencing and deletion/duplication); AXIN2, CTNNA1, EGFR, EGLN1, HOXB13, KIT, MITF, MSH3, PDGFRA, POLD1 and POLE (sequencing only); EPCAM and GREM1 (deletion/duplication only).     05/27/2023 -   Chemotherapy   Patient is on Treatment Plan : PANCREATIC Abraxane D1,8,15 + Gemcitabine D1,8,15 q28d        INTERVAL HISTORY:  Marie [facility] is here for a follow up of Pancreatic Cancer . She was last seen by PA-C Cassie on 06/11/2023. She presents to the clinic accompanied. Pt state that last treatment went well. She has occasional nausea. PT denies having numbness and tingling. Pt report of having some headaches and what to know what she could take if it happens again.  All other systems were reviewed with the patient and are negative.  MEDICAL HISTORY:  Past Medical History:  Diagnosis Date   Chronic kidney disease    stage 3    Chronic sphenoidal sinusitis    Degenerative arthritis of hip    requirde surgery 2009   Depression 2010   Diplopia 2011   in left lateral gaze    Diplopia    " my eyes dont track together, i have corrective prisms in my eye glasses"    Diverticulosis 2006   noted in a colonoscopy   Esophageal reflux 2013   Hypertension    IGT (impaired glucose tolerance) 2011   denies hx of diabetes    Insomnia 2009   Medical history non-contributory    Mixed hyperlipidemia 2000   Osteoarthritis    Osteopenia 2005   Other and unspecified hyperlipidemia 2000   Other specified disease of sebaceous glands    Palpitations 2009   secondary to PSVT   Parkinson disease    PNA (pneumonia) 2014   Psoriasis 2004  PSVT (paroxysmal supraventricular tachycardia) 2009   "report it has been some time since i had that "    Situational stress    Urinary tract infection    thru wed 05/15/23, completed 3d antibiotics   Venous insufficiency since 2017    SURGICAL HISTORY: Past Surgical History:  Procedure Laterality Date   BILIARY BRUSHING  03/29/2023   Procedure: BILIARY BRUSHING;  Surgeon: Hilarie Fredrickson, MD;  Location[facility] ENDOSCOPY;  Service: Gastroenterology;;   BILIARY STENT PLACEMENT  03/29/2023   Procedure: BILIARY STENT PLACEMENT;  Surgeon: Hilarie Fredrickson, MD;   Location[facility];  Service: Gastroenterology;;   BIOPSY  04/08/2023   Procedure: BIOPSY;  Surgeon: Lemar Lofty., MD;  Location: [facility] ENDOSCOPY;  Service: Gastroenterology;;   CATARACT EXTRACTION W/ INTRAOCULAR LENS  IMPLANT, BILATERAL     COLONOSCOPY  01/30/2010   Jarold Motto   ERCP N/A 03/29/2023   Procedure: ENDOSCOPIC RETROGRADE CHOLANGIOPANCREATOGRAPHY (ERCP);  Surgeon: Hilarie Fredrickson, MD;  [facility];  Service: Gastroenterology;  Laterality: N/A;   ESOPHAGOGASTRODUODENOSCOPY (EGD) WITH PROPOFOL N/A 04/08/2023   Procedure: ESOPHAGOGASTRODUODENOSCOPY (EGD) WITH PROPOFOL;  Surgeon: Meridee Score Netty Starring., MD;  Location: [facility] ENDOSCOPY;  Service: Gastroenterology;  Laterality: N/A;   ETHMOIDECTOMY Left 11/17/2020   Procedure: LEFT SIDED TOTAL ETHMOIDECTOMY;  Surgeon: Drema Halon, MD;  Location: [facility];  Service: ENT;  Laterality: Left;   EUS N/A 04/08/2023   Procedure: UPPER ENDOSCOPIC ULTRASOUND (EUS) LINEAR;  Surgeon: Lemar Lofty., MD;  Location: [facility] ENDOSCOPY;  Service: Gastroenterology;  Laterality: N/A;   EYE SURGERY     FINE NEEDLE ASPIRATION N/A 04/08/2023   Procedure: FINE NEEDLE ASPIRATION (FNA) LINEAR;  Surgeon: Lemar Lofty., MD;  Location: [facility] ENDOSCOPY;  Service: Gastroenterology;  Laterality: N/A;   HEMORRHOID SURGERY     IR IMAGING GUIDED PORT INSERTION  05/17/2023   JOINT REPLACEMENT     LASIK     left medial and superior rectus muscle recesion for diplopia correction     SINUS ENDO WITH FUSION Left 11/17/2020   Procedure: SINUS ENDOSCOPY WITH FUSION NAVIGATION;  Surgeon: Drema Halon, MD;  Location: [facility];  Service: ENT;  Laterality: Left;   SPHENOIDECTOMY Left 11/17/2020   Procedure: LEFT SIDED SPHENOIDECTOMY WITH TISSUE REMOVAL;  Surgeon: Drema Halon, MD;  Location: [facility];  Service: ENT;  Laterality: Left;   SPHINCTEROTOMY  03/29/2023   Procedure:  SPHINCTEROTOMY;  Surgeon: Hilarie Fredrickson, MD;  Location: [facility] END[facility];  Service: Gastroenterology;;   surgery of right eye rectus muscles for diplpia correction   2019   TOTAL HIP ARTHROPLASTY Right 2010   TOTAL HIP ARTHROPLASTY Left 08/19/2019   Procedure: TOTAL HIP ARTHROPLASTY ANTERIOR APPROACH;  Surgeon: Ollen Gross, MD;  Location: [facility] ORS;  Service: Orthopedics;  Laterality: Left;    TURBINATE REDUCTION Left 11/17/2020   Procedure: LEFT SIDED TURBINATE REDUCTION;  Surgeon: Drema Halon, MD;  Location: [facility];  Service: ENT;  Laterality: Left;   URETHRAL SLING     midurethral sling with TVT Exact and Cystoscopy    I have reviewed the social history and family history with the patient and they are unchanged from previous note.  ALLERGIES:  is allergic to erythromycin, macrobid [nitrofurantoin], keflex [cephalexin], levofloxacin, penicillins, and sulfamethoxazole-trimethoprim.  MEDICATIONS:  Current Outpatient Medications  Medication Sig Dispense Refill   ALPRAZolam (XANAX) 0.5 MG tablet Take 0.25 mg by mouth as needed for anxiety. Takes as needed for  sleep     AUVELITY 45-105 MG TBCR Take 1 tablet by mouth every evening.     buPROPion (WELLBUTRIN XL) 150 MG 24 hr tablet Take 150 mg by mouth daily.     Calcium Carbonate Antacid (CALCIUM CARBONATE PO) Take by mouth.     carbidopa-levodopa (SINEMET IR) 25-100 MG tablet Take 1.5 tablets by mouth every 4 (four) hours as needed. -Try to separate Sinemet from food (especially protein-rich foods like meat, dairy, eggs) by about 30-60 mins - this will help the absorption of the medication. If you have some nausea with the medication, you can take it with some light food like crackers or ginger ale 540 tablet 4   donepezil (ARICEPT) 5 MG tablet Take 1 tablet (5 mg total) by mouth at bedtime. 30 tablet 0   lidocaine-prilocaine (EMLA) cream Apply to affected area once 30 g 3   losartan (COZAAR) 50 MG tablet Take 1  tablet (50 mg total) by mouth daily. 90 tablet 1   Multiple Vitamin (MULTIVITAMIN WITH MINERALS) TABS tablet Take 1 tablet by mouth daily. Women's Multivitamin     ondansetron (ZOFRAN) 8 MG tablet Take 1 tablet (8 mg total) by mouth every 8 (eight) hours as needed for nausea or vomiting. 30 tablet 2   OVER THE COUNTER MEDICATION Take 1 tablet by mouth daily as needed (constipation).     pantoprazole (PROTONIX) 40 MG tablet Take 40 mg by mouth daily.     Polyethyl Glycol-Propyl Glycol (SYSTANE) 0.4-0.3 % SOLN Place 1 drop into both eyes 3 (three) times daily as needed (dry/irritated eyes.).     polyethylene glycol (MIRALAX / GLYCOLAX) 17 g packet Take 17 g by mouth daily.     pravastatin (PRAVACHOL) 80 MG tablet Take 1 tablet (80 mg total) by mouth every evening.     prochlorperazine (COMPAZINE) 10 MG tablet Take 1 tablet (10 mg total) by mouth every 6 (six) hours as needed for nausea or vomiting. 30 tablet 1   promethazine (PHENERGAN) 25 MG tablet Take 25 mg by mouth every 6 (six) hours as needed for nausea or vomiting.     triamcinolone (NASACORT) 55 MCG/ACT AERO nasal inhaler Place 2 sprays into the nose daily. Use at night (Patient taking differently: Place 1 spray into the nose daily. Use at night) 1 each 12   No current facility-administered medications for this visit.   Facility-Administered Medications Ordered in Other Visits  Medication Dose Route Frequency Provider Last Rate Last Admin   gemcitabine (GEMZAR) 1,748 mg in sodium chloride 0.9 % 250 mL chemo infusion  1,000 mg/m2 (Treatment Plan Recorded) Intravenous Once Malachy Mood, MD 592 mL/hr at 06/24/23 1607 1,748 mg at 06/24/23 1607   heparin lock flush 100 unit/mL  500 Units Intracatheter Once PRN Malachy Mood, MD       sodium chloride flush (NS) 0.9 % injection 10 mL  10 mL Intracatheter PRN Malachy Mood, MD        PHYSICAL EXAMINATION: ECOG PERFORMANCE STATUS: 1 - Symptomatic but completely ambulatory  Vitals:   06/24/23 1357  BP:  (!) 140/78  Pulse: 75  Resp: 18  Temp: 98.1 F (36.7 C)  SpO2: 99%   Wt Readings from Last 3 Encounters:  06/24/23 148 lb 4.8 oz (67.3 kg)  06/11/23 149 lb 4.8 oz (67.7 kg)  05/30/23 145 lb (65.8 kg)     GENERAL:alert, no distress and comfortable SKIN: skin color normal, no rashes or significant lesions EYES: normal, Conjunctiva are pink and non-injected,  sclera clear  NEURO: alert & oriented x 3 with fluent speech   LABORATORY DATA:  I have reviewed the data as listed    Latest Ref Rng & Units 06/24/2023    1:29 PM 06/11/2023   10:02 AM 05/30/2023    1:16 PM  CBC  WBC 4.0 - 10.5 K/uL 5.5  4.6  8.3   Hemoglobin 12.0 - 15.0 g/dL 29.5  28.4  13.2   Hematocrit 36.0 - 46.0 % 33.7  34.6  36.8   Platelets 150 - 400 K/uL 256  285  228         Latest Ref Rng & Units 06/24/2023    1:29 PM 06/11/2023   10:02 AM 05/30/2023    1:16 PM  CMP  Glucose 70 - 99 mg/dL 440  102  725   BUN 8 - 23 mg/dL 27  19  29    Creatinine 0.44 - 1.00 mg/dL 3.66  4.40  3.47   Sodium 135 - 145 mmol/L 139  139  140   Potassium 3.5 - 5.1 mmol/L 3.8  3.8  4.3   Chloride 98 - 111 mmol/L 107  108  103   CO2 22 - 32 mmol/L 27  25  24    Calcium 8.9 - 10.3 mg/dL 9.0  8.6  9.1   Total Protein 6.5 - 8.1 g/dL 6.7  6.1  6.4   Total Bilirubin 0.3 - 1.2 mg/dL 0.6  0.6  1.7   Alkaline Phos 38 - 126 U/L 122  89  139   AST 15 - 41 U/L 42  14  26   ALT 0 - 44 U/L 7  14  19        RADIOGRAPHIC STUDIES: I have personally reviewed the radiological images as listed and agreed with the findings in the report. No results found.    Orders Placed This Encounter  Procedures   Cancer antigen 19-9    Standing Status:   Standing    Number of Occurrences:   20    Standing Expiration Date:   06/23/2024   CBC with Differential ([facility] Only)    Standing Status:   Future    Standing Expiration Date:   08/18/2024   CMP ([facility] only)    Standing Status:   Future    Standing Expiration Date:   08/18/2024   CBC with  Differential ([facility] Only)    Standing Status:   Future    Standing Expiration Date:   09/01/2024   CMP ([facility] only)    Standing Status:   Future    Standing Expiration Date:   09/01/2024   All questions were answered. The patient knows to call the clinic with any problems, questions or concerns. No barriers to learning was detected. The total time spent in the appointment was 30 minutes.     Malachy Mood, MD 06/24/2023   Carolin Coy, CMA, am acting as scribe for Malachy Mood, MD.   I have reviewed the above documentation for accuracy and completeness, and I agree with the above.

## 2023-06-24 NOTE — Patient Instructions (Signed)
[facility]  Discharge Instructions: Thank you for choosing [facility] to provide your oncology and hematology care.   If you have a lab appointment with the Cancer Center, please go directly to the Cancer Center and check in at the registration area.   Wear comfortable clothing and clothing appropriate for easy access to any Portacath or PICC line.   We strive to give you quality time with your provider. You may need to reschedule your appointment if you arrive late (15 or more minutes).  Arriving late affects you and other patients whose appointments are after yours.  Also, if you miss three or more appointments without notifying the office, you may be dismissed from the clinic at the provider's discretion.      For prescription refill requests, have your pharmacy contact our office and allow 72 hours for refills to be completed.    Today you received the following chemotherapy and/or immunotherapy agents Abraxane, Gemzar      To help prevent nausea and vomiting after your treatment, we encourage you to take your nausea medication as directed.  BELOW ARE SYMPTOMS THAT SHOULD BE REPORTED IMMEDIATELY: *FEVER GREATER THAN 100.4 F (38 C) OR HIGHER *CHILLS OR SWEATING *NAUSEA AND VOMITING THAT IS NOT CONTROLLED WITH YOUR NAUSEA MEDICATION *UNUSUAL SHORTNESS OF BREATH *UNUSUAL BRUISING OR BLEEDING *URINARY PROBLEMS (pain or burning when urinating, or frequent urination) *BOWEL PROBLEMS (unusual diarrhea, constipation, pain near the anus) TENDERNESS IN MOUTH AND THROAT WITH OR WITHOUT PRESENCE OF ULCERS (sore throat, sores in mouth, or a toothache) UNUSUAL RASH, SWELLING OR PAIN  UNUSUAL VAGINAL DISCHARGE OR ITCHING   Items with * indicate a potential emergency and should be followed up as soon as possible or go to the Emergency Department if any problems should occur.  Please show the CHEMOTHERAPY ALERT CARD or IMMUNOTHERAPY ALERT CARD at  check-in to the Emergency Department and triage nurse.  Should you have questions after your visit or need to cancel or reschedule your appointment, please contact [facility]  Dept: [phone]  and follow the prompts.  Office hours are 8:00 a.m. to 4:30 p.m. Monday - Friday. Please note that voicemails left after 4:00 p.m. may not be returned until the following business day.  We are closed weekends and major holidays. You have access to a nurse at all times for urgent questions. Please call the main number to the clinic Dept: [phone] and follow the prompts.   For any non-urgent questions, you may also contact your provider using MyChart. We now offer e-Visits for anyone 18 and older to request care online for non-urgent symptoms. For details visit [email or web address].   Also download the MyChart app! Go to the app store, search "MyChart", open the app, select [facility], and log in with your MyChart username and password.   

## 2023-06-28 ENCOUNTER — Other Ambulatory Visit: Payer: Self-pay

## 2023-06-28 ENCOUNTER — Other Ambulatory Visit (HOSPITAL_COMMUNITY): Payer: Self-pay

## 2023-06-28 ENCOUNTER — Encounter: Payer: Self-pay | Admitting: Hematology

## 2023-06-28 MED ORDER — PREDNISOLONE SODIUM PHOSPHATE 15 MG/5ML PO SOLN
5.0000 mL | Freq: Four times a day (QID) | ORAL | 1 refills | Status: DC
Start: 2023-06-28 — End: 2024-04-14
  Filled 2023-06-28 (×2): qty 240, 12d supply, fill #0
  Filled 2023-07-20: qty 240, 12d supply, fill #1

## 2023-06-28 NOTE — Progress Notes (Signed)
Patient called in stating she has sores in her mouth that are not getting better. Been using the homemade mouth wash and it is not working on the pain. Hav ebeen dealing with sores for about a week . Having a hard time eating and drinking because of them would like to get something called in to pharmacy to help with this. I told her about the Magic Mouthwash and would check with Dr. [name] to see if we could get it called in.

## 2023-07-07 ENCOUNTER — Encounter: Payer: Self-pay | Admitting: Neurology

## 2023-07-07 NOTE — Progress Notes (Addendum)
Patient Care Team: [name], MD as PCP - General (Family Medicine) Malachy Mood, MD as Consulting Physician (Oncology)  Clinic Day:  07/08/2023  Referring physician: Malachy Mood, MD  ASSESSMENT & PLAN:   Assessment & Plan: Pancreatic cancer [facility]) -stage IB (T2, N0, Mx), diagnosed in April 2024 -Presented to the emergency room on 4/17 with jaundice and dark urine -Baseline bilirubin elevated at 8.6 -CT scan showed pancreatic head lesion with involvement of the distal CBD and associated biliary ductal dilation.  No evidence of lymphadenopathy or metastatic disease to the liver -Had ERCP and stent placement on 4/19 -Underwent EUS by Dr. Meridee Score on 04/08/23 showing 30 x 23 mm pancreatic head lesion without any vascular involvement.  -The final pathology showed pancreatic adenocarcinoma -She has met pancreatobiliary surgeon Dr. Donell Beers, and she declined the surgery.  Patient understands that her cancer is not curable without surgery. -We discussed the natural course of early stage pancreatic cancer, and the benefit of treatment to prolong her life and improve her quality life. -I encouraged her to consider chemotherapy for 3 to 6 months, with consolidation radiation -I also discussed option of radiation alone, due to her concern of potential side effect from chemotherapy. -Patient has decided to start chemotherapy.  She is currently doing well, lab reviewed, adequate for treatment. will proceed with chemotherapy of gemcitabine and Abraxane today at full dose   Parkinson disease Patient seeing neurology on routine basis. Dose Sinemet recently increased.  Neurology added aricept 10 mg daily.  Will have follow up appointment/conversation with neurology provider in near future.   Essential hypertension Blood pressure mildly elevated today.  -continue BP medications as prescribed.  -limit excess sodium and increase daily water intake.    The patient understands the plans discussed today  and is in agreement with them.  She knows to contact our office if she develops concerns prior to her next appointment.  PLAN: -lab reviewed hg 11.8 -CMP- Cr 1.08, BUN 29, eGFR 53, Alk. Phos. 138, LFTs normal -proceed with D1,C2 Gemzar/Abraxane today -lab/flush and treatment 07/22/2023  I provided 30 minutes of face-to-face time during this encounter and > 50% was spent counseling as documented under my assessment and plan.    Carlean Jews, NP   [facility] [address] [place] [address] Dept: [phone] Dept Fax: [phone]   No orders of the defined types were placed in this encounter.     CHIEF COMPLAINT:  CC: f/u cancer of pancreatic head.  Current Treatment:  Pancreatic Abraxane D1,8,15 + Gemcitabine D1,8,15, q28d   INTERVAL HISTORY:  Hai is here today for repeat clinical assessment. She denies fevers or chills. She denies pain. Her appetite is good. Her weight has increased 2 pounds over last 1 month .  I have reviewed the past medical history, past surgical history, social history and family history with the patient and they are unchanged from previous note.  ALLERGIES:  is allergic to erythromycin, macrobid [nitrofurantoin], keflex [cephalexin], levofloxacin, penicillins, and sulfamethoxazole-trimethoprim.  MEDICATIONS:  Current Outpatient Medications  Medication Sig Dispense Refill   ALPRAZolam (XANAX) 0.5 MG tablet Take 0.25 mg by mouth as needed for anxiety. Takes as needed for sleep     AUVELITY 45-105 MG TBCR Take 1 tablet by mouth every evening.     buPROPion (WELLBUTRIN XL) 150 MG 24 hr tablet Take 150 mg by mouth daily.     Calcium Carbonate Antacid (CALCIUM CARBONATE PO) Take  by mouth.     carbidopa-levodopa (SINEMET IR) 25-100 MG tablet Take 1.5 tablets by mouth every 4 (four) hours as needed. -Try to separate Sinemet from food (especially protein-rich foods like meat,  dairy, eggs) by about 30-60 mins - this will help the absorption of the medication. If you have some nausea with the medication, you can take it with some light food like crackers or ginger ale 540 tablet 4   donepezil (ARICEPT) 10 MG tablet Take 1 tablet (10 mg total) by mouth at bedtime. 30 tablet 5   lidocaine-prilocaine (EMLA) cream Apply to affected area once 30 g 3   losartan (COZAAR) 50 MG tablet Take 1 tablet (50 mg total) by mouth daily. 90 tablet 1   magic mouthwash (multi-ingredient) oral suspension Take 5 mLs by mouth 4 (four) times daily. 240 mL 1   Multiple Vitamin (MULTIVITAMIN WITH MINERALS) TABS tablet Take 1 tablet by mouth daily. Women's Multivitamin     OVER THE COUNTER MEDICATION Take 1 tablet by mouth daily as needed (constipation).     pantoprazole (PROTONIX) 40 MG tablet Take 40 mg by mouth daily.     Polyethyl Glycol-Propyl Glycol (SYSTANE) 0.4-0.3 % SOLN Place 1 drop into both eyes 3 (three) times daily as needed (dry/irritated eyes.).     polyethylene glycol (MIRALAX / GLYCOLAX) 17 g packet Take 17 g by mouth daily.     pravastatin (PRAVACHOL) 80 MG tablet Take 1 tablet (80 mg total) by mouth every evening.     prochlorperazine (COMPAZINE) 10 MG tablet Take 1 tablet (10 mg total) by mouth every 6 (six) hours as needed for nausea or vomiting. 30 tablet 1   promethazine (PHENERGAN) 25 MG tablet Take 25 mg by mouth every 6 (six) hours as needed for nausea or vomiting.     triamcinolone (NASACORT) 55 MCG/ACT AERO nasal inhaler Place 2 sprays into the nose daily. Use at night (Patient taking differently: Place 1 spray into the nose daily. Use at night) 1 each 12   No current facility-administered medications for this visit.   Facility-Administered Medications Ordered in Other Visits  Medication Dose Route Frequency Provider Last Rate Last Admin   0.9 %  sodium chloride infusion   Intravenous Once Malachy Mood, MD       gemcitabine (GEMZAR) 1,748 mg in sodium chloride 0.9 % 250  mL chemo infusion  1,000 mg/m2 (Treatment Plan Recorded) Intravenous Once Malachy Mood, MD       heparin lock flush 100 unit/mL  500 Units Intracatheter Once PRN Malachy Mood, MD       PACLitaxel-protein bound (ABRAXANE) chemo infusion 200 mg  125 mg/m2 (Treatment Plan Recorded) Intravenous Once Malachy Mood, MD 80 mL/hr at 07/08/23 1427 200 mg at 07/08/23 1427   sodium chloride flush (NS) 0.9 % injection 10 mL  10 mL Intracatheter PRN Malachy Mood, MD        HISTORY OF PRESENT ILLNESS:   Oncology History Overview Note   Cancer Staging  Pancreatic cancer [facility]) Staging form: Exocrine Pancreas, AJCC 8th Edition - Clinical: Stage IB (cT2, cN0, cM0) - Signed by Heilingoetter, Cassandra L, PA-C on 04/12/2023 Total positive nodes: 0     Pancreatic cancer (HCC)  03/27/2023 Imaging   CT ABDOMEN PELVIS W CONTRAST   IMPRESSION: Low-attenuation mass centered along the pancreatic head with involvement of the distal common duct with the associated biliary duct ductal dilatation and pancreatic atrophy. Changes are worrisome for neoplasm either a pancreatic neoplasm or cholangiocarcinoma. Recommend further  evaluation.   No liver metastases. No pathologically enlarged upper abdominal nodes.   Diffuse colonic stool.  Normal appendix.  Few diverticula.   Portions of the pelvis are obscured by the streak artifact from the bilateral hip arthroplasties.   03/29/2023 Imaging   DG ERCP   IMPRESSION: Intraoperative fluoroscopic images show dilated intra or extrahepatic bile ducts with high-grade stenosis at the level of the distal CBD. Biliary tree is decompressed following placement of common bile duct stent.   04/08/2023 Pathology Results   CYTOLOGY - NON PAP  CASE: [id number]  PATIENT: Didi Tan  Non-Gynecological Cytology Report   FINAL MICROSCOPIC DIAGNOSIS:  A. PANCREAS, HEAD, FINE NEEDLE ASPIRATION:  - Malignant cells present (see Comment)      04/08/2023 Procedure   EUS by Dr.  Meridee Score   EUS impression  - A mass was identified in the pancreatic head/ genu of the pancreas. Cytology results are pending. However, the endosonographic appearance is highly suspicious for adenocarcinoma. This was staged T2 N0 Mx by endosonographic criteria. The staging applies if malignancy is confirmed. Fine needle biopsy performed.  - There was dilation in the common bile duct and in the common hepatic duct which measured up to 8 mm.  - One stent was visualized endosonographically within the common bile duct.  - Hyperechoic material consistent with sludge was visualized endosonographically in the gallbladder.  - Two enlarged lymph nodes were visualized in the celiac region ( level 20) and porta hepatis region. Tissue has not been obtained. However, the endosonographic appearance is suggestive of benign inflammatory change based on the visualization endosonographically.   04/12/2023 Initial Diagnosis   Pancreatic cancer (HCC)   04/12/2023 Cancer Staging   Staging form: Exocrine Pancreas, AJCC 8th Edition - Clinical: Stage IB (cT2, cN0, cM0) - Signed by Heilingoetter, Cassandra L, PA-C on 04/12/2023 Total positive nodes: 0   04/12/2023 Tumor Marker   Patient's tumor was tested for the following markers: CA 19.9. Results of the tumor marker test revealed 512.   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  Report date is 05/03/2023.   The CancerNext-Expanded gene panel offered by [employer] and includes sequencing, rearrangement, and RNA analysis for the following 71 genes:  AIP, ALK, APC, ATM, BAP1, BARD1, BMPR1A, BRCA1, BRCA2, BRIP1, CDC73, CDH1, CDK4, CDKN1B, CDKN2A, CHEK2, DICER1, FH, FLCN, KIF1B, LZTR1,MAX, MEN1, MET, MLH1, MSH2, MSH6, MUTYH, NF1, NF2, NTHL1, PALB2, PHOX2B, PMS2, POT1, PRKAR1A, PTCH1, PTEN, RAD51C,RAD51D, RB1, RET, SDHA, SDHAF2, SDHB, SDHC, SDHD, SMAD4, SMARCA4, SMARCB1, SMARCE1, STK11, SUFU, TMEM127, TP53,TSC1, TSC2 and VHL  (sequencing and deletion/duplication); AXIN2, CTNNA1, EGFR, EGLN1, HOXB13, KIT, MITF, MSH3, PDGFRA, POLD1 and POLE (sequencing only); EPCAM and GREM1 (deletion/duplication only).     05/27/2023 -  Chemotherapy   Patient is on Treatment Plan : PANCREATIC Abraxane D1,8,15 + Gemcitabine D1,8,15 q28d         REVIEW OF SYSTEMS:   Constitutional: Denies fevers, chills or abnormal weight loss. Admits to fatigue  Eyes: Denies blurriness of vision Ears, nose, mouth, throat, and face: Denies mucositis or sore throat Respiratory: Denies cough, dyspnea or wheezes Cardiovascular: Denies palpitation, chest discomfort or lower extremity swelling Gastrointestinal:  reports some nausea and aches of the right upper quadrant of the abdomen. This sometimes radiates around to the back. This does get worse with eating.  Skin: Denies abnormal skin rashes Lymphatics: Denies new lymphadenopathy or easy bruising Neurological:she has history of Parkinson's disease. States that dose of Sinemet was increased in the  last 3 to 4 months. She was also started on aricept for the past 30 days. She and her neurologist are discussing the benefits versus the side effects to decide if this is something they want to continue.  Behavioral/Psych: she reports increased irritability and inability to handle stress.  All other systems were reviewed with the patient and are negative.   VITALS:   Today's Vitals   07/08/23 1232  BP: (!) 146/82  Pulse: 78  Resp: 16  Temp: 98.9 F (37.2 C)  TempSrc: Oral  SpO2: 98%  Weight: 150 lb (68 kg)  Height: 5\' 6"  (1.676 m)   Body mass index is 24.21 kg/m.   Wt Readings from Last 3 Encounters:  07/08/23 150 lb (68 kg)  06/24/23 148 lb 4.8 oz (67.3 kg)  06/11/23 149 lb 4.8 oz (67.7 kg)    Body mass index is 24.21 kg/m.  Performance status (ECOG): 1 - Symptomatic but completely ambulatory  PHYSICAL EXAM:   GENERAL:alert, no distress and comfortable SKIN: skin color, texture,  turgor are normal, no rashes or significant lesions EYES: normal, Conjunctiva are pink and non-injected, sclera clear OROPHARYNX:no exudate, no erythema and lips, buccal mucosa, and tongue normal  NECK: supple, thyroid normal size, non-tender, without nodularity LYMPH:  no palpable lymphadenopathy in the cervical, axillary or inguinal LUNGS: clear to auscultation and percussion with normal breathing effort HEART: regular rate & rhythm and no murmurs and no lower extremity edema ABDOMEN:abdomen soft and slightly tender with palpation of the right upper quadrant.  Musculoskeletal:no cyanosis of digits and no clubbing  NEURO: alert & oriented x 3 with fluent speech, no focal motor/sensory deficits  LABORATORY DATA:  I have reviewed the data as listed    Component Value Date/Time   NA 139 07/08/2023 1209   NA 140 05/30/2023 1316   K 4.0 07/08/2023 1209   CL 106 07/08/2023 1209   CO2 26 07/08/2023 1209   GLUCOSE 128 (H) 07/08/2023 1209   BUN 29 (H) 07/08/2023 1209   BUN 29 (H) 05/30/2023 1316   CREATININE 1.08 (H) 07/08/2023 1209   CALCIUM 9.3 07/08/2023 1209   PROT 6.7 07/08/2023 1209   PROT 6.4 05/30/2023 1316   ALBUMIN 3.9 07/08/2023 1209   ALBUMIN 3.9 05/30/2023 1316   AST 19 07/08/2023 1209   ALT 11 07/08/2023 1209   ALKPHOS 138 (H) 07/08/2023 1209   BILITOT 0.6 07/08/2023 1209   GFRNONAA 53 (L) 07/08/2023 1209   GFRAA 58 (L) 08/20/2019 0252     Lab Results  Component Value Date   WBC 8.2 07/08/2023   NEUTROABS 5.0 07/08/2023   HGB 11.8 (L) 07/08/2023   HCT 34.6 (L) 07/08/2023   MCV 94.5 07/08/2023   PLT 280 07/08/2023      Chemistry      Component Value Date/Time   NA 139 07/08/2023 1209   NA 140 05/30/2023 1316   K 4.0 07/08/2023 1209   CL 106 07/08/2023 1209   CO2 26 07/08/2023 1209   BUN 29 (H) 07/08/2023 1209   BUN 29 (H) 05/30/2023 1316   CREATININE 1.08 (H) 07/08/2023 1209      Component Value Date/Time   CALCIUM 9.3 07/08/2023 1209   ALKPHOS 138  (H) 07/08/2023 1209   AST 19 07/08/2023 1209   ALT 11 07/08/2023 1209   BILITOT 0.6 07/08/2023 1209       RADIOGRAPHIC STUDIES: I have personally reviewed the radiological images as listed and agreed with the findings in the report. MR  BRAIN W WO CONTRAST  Result Date: 06/18/2023  [facility] [address], [address] [place], [place] [address] [phone] NEUROIMAGING REPORT STUDY DATE: 06/18/2023 PATIENT NAME: MARICIA MESMER DOB: [birth year] MRN: [id number] EXAM: MRI Brain with and without contrast ORDERING CLINICIAN: Naomie Dean, MD CLINICAL HISTORY: 77 year old woman with cognitive decline and pancreatic cancer COMPARISON FILMS: MRI 09/30/2020 TECHNIQUE:MRI of the brain with and without contrast was obtained utilizing 5 mm axial slices with T1, T2, T2 flair, SWI and diffusion weighted views.  T1 sagittal, T2 coronal and postcontrast views in the axial and coronal plane were obtained.  CONTRAST: 6.5 mL [employer] IMAGING SITE: [facility], [address] [place], [place], [place] FINDINGS: On sagittal images, the spinal cord is imaged caudally to C4 and is normal in caliber.   The contents of the posterior fossa are of normal size and position.   The pituitary gland and optic chiasm appear normal.    Brain volume appears normal for age.   The ventricles are normal in size and without distortion.  There are no abnormal extra-axial collections of fluid.  There are scattered T2/FLAIR hyperintense foci predominantly in the subcortical and deep white matter.  Some foci are also noted in the pons and right basal ganglia.  None of the foci enhance or appear to be acute.  Compared to the MRI from 09/30/2020, there has been slight progression.  Diffusion weighted images are normal.  Susceptibility weighted images are normal.   There have been bilateral lens replacements.  Otherwise, the orbits appear normal.   The VIIth/VIIIth nerve complex appears normal.  The mastoid air cells appear normal.  The  paranasal sinuses appear normal.  Flow voids are identified within the major intracerebral arteries.  After the infusion of contrast material, a normal enhancement pattern is noted.   This MRI of the brain with and without contrast shows the following: Scattered T2/FLAIR hyperintense foci in the cerebral hemispheres most consistent with mild to moderate chronic microvascular ischemic change.  None of the foci enhance and they do not appear to be acute.  There has been very slight progression compared to the 2021 MRI. Brain volume was normal for age No acute findings.  Normal enhancement pattern. INTERPRETING PHYSICIAN: Richard A. Epimenio Foot, MD, PhD, FAAN Certified in  Neuroimaging by [employer]    Addendum I have seen the patient, examined her. I agree with the assessment and and plan and have edited the notes.   Ms Kovalcik is here for cycle 2-day 15 chemotherapy.  He is overall tolerating chemo well.  She reports intermittent right upper quadrant pain, usually last a few minutes, no fever, chills, or nausea.  She is constipated and on laxative.  Lab reviewed, overall stable.  Suspect her abdominal pain could be related to constipation, no clinical suspicion for cholangitis.  Will continue her chemotherapy, plan to repeat a CT scan after cycle 3, will monitor her CA 19.9 every 4 weeks.all questions were answered.  Malachy Mood MD 07/08/2023

## 2023-07-07 NOTE — Assessment & Plan Note (Addendum)
-  stage IB (T2, N0, Mx), diagnosed in April 2024 -Presented to the emergency room on 4/17 with jaundice and dark urine -Baseline bilirubin elevated at 8.6 -CT scan showed pancreatic head lesion with involvement of the distal CBD and associated biliary ductal dilation.  No evidence of lymphadenopathy or metastatic disease to the liver -Had ERCP and stent placement on 4/19 -Underwent EUS by Dr. Meridee Score on 04/08/23 showing 30 x 23 mm pancreatic head lesion without any vascular involvement.  -The final pathology showed pancreatic adenocarcinoma -She has met pancreatobiliary surgeon Dr. Donell Beers, and she declined the surgery.  Patient understands that her cancer is not curable without surgery. -We discussed the natural course of early stage pancreatic cancer, and the benefit of treatment to prolong her life and improve her quality life. -I encouraged her to consider chemotherapy for 3 to 6 months, with consolidation radiation -I also discussed option of radiation alone, due to her concern of potential side effect from chemotherapy. -Patient has decided to start chemotherapy.  She is currently doing well, lab reviewed, adequate for treatment. will proceed with chemotherapy of gemcitabine and Abraxane today at full dose

## 2023-07-08 ENCOUNTER — Inpatient Hospital Stay: Payer: Medicare PPO | Admitting: Nutrition

## 2023-07-08 ENCOUNTER — Telehealth: Payer: Self-pay | Admitting: Neurology

## 2023-07-08 ENCOUNTER — Inpatient Hospital Stay: Payer: Medicare PPO | Admitting: Nurse Practitioner

## 2023-07-08 ENCOUNTER — Inpatient Hospital Stay: Payer: Medicare PPO

## 2023-07-08 ENCOUNTER — Encounter: Payer: Self-pay | Admitting: Nurse Practitioner

## 2023-07-08 ENCOUNTER — Other Ambulatory Visit: Payer: Self-pay

## 2023-07-08 VITALS — BP 146/82 | HR 78 | Temp 98.9°F | Resp 16 | Ht 66.0 in | Wt 150.0 lb

## 2023-07-08 DIAGNOSIS — C250 Malignant neoplasm of head of pancreas: Secondary | ICD-10-CM

## 2023-07-08 DIAGNOSIS — Z5111 Encounter for antineoplastic chemotherapy: Secondary | ICD-10-CM | POA: Diagnosis not present

## 2023-07-08 DIAGNOSIS — G20A1 Parkinson's disease without dyskinesia, without mention of fluctuations: Secondary | ICD-10-CM | POA: Diagnosis not present

## 2023-07-08 DIAGNOSIS — I10 Essential (primary) hypertension: Secondary | ICD-10-CM | POA: Diagnosis not present

## 2023-07-08 LAB — CMP (CANCER CENTER ONLY)
ALT: 11 U/L (ref 0–44)
AST: 19 U/L (ref 15–41)
Albumin: 3.9 g/dL (ref 3.5–5.0)
Alkaline Phosphatase: 138 U/L — ABNORMAL HIGH (ref 38–126)
Anion gap: 7 (ref 5–15)
BUN: 29 mg/dL — ABNORMAL HIGH (ref 8–23)
CO2: 26 mmol/L (ref 22–32)
Calcium: 9.3 mg/dL (ref 8.9–10.3)
Chloride: 106 mmol/L (ref 98–111)
Creatinine: 1.08 mg/dL — ABNORMAL HIGH (ref 0.44–1.00)
GFR, Estimated: 53 mL/min — ABNORMAL LOW (ref >60)
Glucose, Bld: 128 mg/dL — ABNORMAL HIGH (ref 70–99)
Potassium: 4 mmol/L (ref 3.5–5.1)
Sodium: 139 mmol/L (ref 135–145)
Total Bilirubin: 0.6 mg/dL (ref 0.3–1.2)
Total Protein: 6.7 g/dL (ref 6.5–8.1)

## 2023-07-08 LAB — CBC WITH DIFFERENTIAL (CANCER CENTER ONLY)
Abs Immature Granulocytes: 0.05 10*3/uL (ref 0.00–0.07)
Basophils Absolute: 0 10*3/uL (ref 0.0–0.1)
Basophils Relative: 1 %
Eosinophils Absolute: 0.4 10*3/uL (ref 0.0–0.5)
Eosinophils Relative: 5 %
HCT: 34.6 % — ABNORMAL LOW (ref 36.0–46.0)
Hemoglobin: 11.8 g/dL — ABNORMAL LOW (ref 12.0–15.0)
Immature Granulocytes: 1 %
Lymphocytes Relative: 16 %
Lymphs Abs: 1.3 10*3/uL (ref 0.7–4.0)
MCH: 32.2 pg (ref 26.0–34.0)
MCHC: 34.1 g/dL (ref 30.0–36.0)
MCV: 94.5 fL (ref 80.0–100.0)
Monocytes Absolute: 1.4 10*3/uL — ABNORMAL HIGH (ref 0.1–1.0)
Monocytes Relative: 17 %
Neutro Abs: 5 10*3/uL (ref 1.7–7.7)
Neutrophils Relative %: 60 %
Platelet Count: 280 10*3/uL (ref 150–400)
RBC: 3.66 MIL/uL — ABNORMAL LOW (ref 3.87–5.11)
RDW: 13.1 % (ref 11.5–15.5)
WBC Count: 8.2 10*3/uL (ref 4.0–10.5)
nRBC: 0 % (ref 0.0–0.2)

## 2023-07-08 MED ORDER — DONEPEZIL HCL 10 MG PO TABS: 10.0000 mg | ORAL_TABLET | Freq: Every day | ORAL | 5 refills | Status: DC

## 2023-07-08 MED ORDER — PROCHLORPERAZINE MALEATE 10 MG PO TABS
10.0000 mg | ORAL_TABLET | Freq: Once | ORAL | Status: AC
  Administered 2023-07-08: 10 mg via ORAL
  Filled 2023-07-08: qty 1

## 2023-07-08 MED ORDER — PACLITAXEL PROTEIN-BOUND CHEMO INJECTION 100 MG
125.0000 mg/m2 | Freq: Once | INTRAVENOUS | Status: AC
  Administered 2023-07-08: 200 mg via INTRAVENOUS
  Filled 2023-07-08: qty 40

## 2023-07-08 MED ORDER — SODIUM CHLORIDE 0.9 % IV SOLN: Freq: Once | INTRAVENOUS | Status: AC

## 2023-07-08 MED ORDER — HEPARIN SOD (PORK) LOCK FLUSH 100 UNIT/ML IV SOLN
500.0000 [IU] | Freq: Once | INTRAVENOUS | Status: AC | PRN
  Administered 2023-07-08: 500 [IU]

## 2023-07-08 MED ORDER — SODIUM CHLORIDE 0.9% FLUSH
10.0000 mL | INTRAVENOUS | Status: DC | PRN
  Administered 2023-07-08: 10 mL

## 2023-07-08 MED ORDER — SODIUM CHLORIDE 0.9 % IV SOLN
1000.0000 mg/m2 | Freq: Once | INTRAVENOUS | Status: AC
  Administered 2023-07-08: 1748 mg via INTRAVENOUS
  Filled 2023-07-08: qty 45.97

## 2023-07-08 NOTE — Patient Instructions (Signed)
[facility]  Discharge Instructions: Thank you for choosing [facility] to provide your oncology and hematology care.   If you have a lab appointment with the Cancer Center, please go directly to the Cancer Center and check in at the registration area.   Wear comfortable clothing and clothing appropriate for easy access to any Portacath or PICC line.   We strive to give you quality time with your provider. You may need to reschedule your appointment if you arrive late (15 or more minutes).  Arriving late affects you and other patients whose appointments are after yours.  Also, if you miss three or more appointments without notifying the office, you may be dismissed from the clinic at the provider's discretion.      For prescription refill requests, have your pharmacy contact our office and allow 72 hours for refills to be completed.    Today you received the following chemotherapy and/or immunotherapy agents Abraxane, Gemzar      To help prevent nausea and vomiting after your treatment, we encourage you to take your nausea medication as directed.  BELOW ARE SYMPTOMS THAT SHOULD BE REPORTED IMMEDIATELY: *FEVER GREATER THAN 100.4 F (38 C) OR HIGHER *CHILLS OR SWEATING *NAUSEA AND VOMITING THAT IS NOT CONTROLLED WITH YOUR NAUSEA MEDICATION *UNUSUAL SHORTNESS OF BREATH *UNUSUAL BRUISING OR BLEEDING *URINARY PROBLEMS (pain or burning when urinating, or frequent urination) *BOWEL PROBLEMS (unusual diarrhea, constipation, pain near the anus) TENDERNESS IN MOUTH AND THROAT WITH OR WITHOUT PRESENCE OF ULCERS (sore throat, sores in mouth, or a toothache) UNUSUAL RASH, SWELLING OR PAIN  UNUSUAL VAGINAL DISCHARGE OR ITCHING   Items with * indicate a potential emergency and should be followed up as soon as possible or go to the Emergency Department if any problems should occur.  Please show the CHEMOTHERAPY ALERT CARD or IMMUNOTHERAPY ALERT CARD at  check-in to the Emergency Department and triage nurse.  Should you have questions after your visit or need to cancel or reschedule your appointment, please contact [facility]  Dept: [phone]  and follow the prompts.  Office hours are 8:00 a.m. to 4:30 p.m. Monday - Friday. Please note that voicemails left after 4:00 p.m. may not be returned until the following business day.  We are closed weekends and major holidays. You have access to a nurse at all times for urgent questions. Please call the main number to the clinic Dept: [phone] and follow the prompts.   For any non-urgent questions, you may also contact your provider using MyChart. We now offer e-Visits for anyone 18 and older to request care online for non-urgent symptoms. For details visit [email or web address].   Also download the MyChart app! Go to the app store, search "MyChart", open the app, select [facility], and log in with your MyChart username and password.   

## 2023-07-08 NOTE — Telephone Encounter (Signed)
Addressed in [facility] msg from yesterday

## 2023-07-08 NOTE — Telephone Encounter (Signed)
Per last visit 6/20, would increase Aricept to 10mg  after 30 days. Rx pended for review and approval.

## 2023-07-08 NOTE — Assessment & Plan Note (Signed)
Blood pressure mildly elevated today.  -continue BP medications as prescribed.  -limit excess sodium and increase daily water intake.

## 2023-07-08 NOTE — Progress Notes (Signed)
Nutrition follow-up completed with patient during infusion for pancreas cancer.  Weight documented as 150 pounds July 29 this is improved from 148 pounds 4.8 ounces on July 15.  Labs noted: Glucose 128, BUN 29, creatinine 1.08.  Patient reports she finds it difficult to eat when she is alone.  She is able to get food from the dining room at mealtimes.  Sometimes food is not that appealing.  She is able to drink 1 oral nutrition supplement daily.  She is really enjoying ice cream.  Patient reports she has occasional nausea however it does not affect her oral intake.  She reports mouth sores have resolved.  States she has some taste alterations.  Denies other nutrition impact symptoms.  Nutrition diagnosis: Inadequate oral intake improved.  Intervention: Provided support and encouragement to continue increased calories and protein. Continue oral nutrition supplements at least 1 daily. Educated on strategies to improve taste alterations and stressed importance of baking soda and salt water gargle. Provided coupons for Ensure plus.  Monitoring, evaluation, goals: Patient will tolerate adequate calories and protein to minimize weight loss throughout treatment.  Next visit: Monday, August 26 during infusion.  **Disclaimer: This note was dictated with voice recognition software. Similar sounding words can inadvertently be transcribed and this note may contain transcription errors which may not have been corrected upon publication of note.**

## 2023-07-08 NOTE — Assessment & Plan Note (Signed)
Patient seeing neurology on routine basis. Dose Sinemet recently increased.  Neurology added aricept 10 mg daily.  Will have follow up appointment/conversation with neurology provider in near future.

## 2023-07-08 NOTE — Telephone Encounter (Signed)
Pt's friend is calling for the new Rx for the increased script for  donepezil (ARICEPT)

## 2023-07-09 ENCOUNTER — Other Ambulatory Visit: Payer: Self-pay

## 2023-07-09 ENCOUNTER — Telehealth: Payer: Self-pay | Admitting: Gastroenterology

## 2023-07-09 DIAGNOSIS — K831 Obstruction of bile duct: Secondary | ICD-10-CM

## 2023-07-09 DIAGNOSIS — C259 Malignant neoplasm of pancreas, unspecified: Secondary | ICD-10-CM

## 2023-07-09 NOTE — Telephone Encounter (Signed)
Inbound call from patient's friend requesting a call back to discuss when patient should have bile duct replaced. States it is also okay to send message through [place]. Please advise, thank you.

## 2023-07-09 NOTE — Telephone Encounter (Signed)
ERCP has been scheduled for 08/08/23 at 3 pm at [facility] with GM   The pt has been advised of the ERCP instructions via My Chart per pt request. See My Chart messages.

## 2023-07-09 NOTE — Telephone Encounter (Signed)
Pt needs ERCP due August.

## 2023-07-12 ENCOUNTER — Encounter: Payer: Self-pay | Admitting: Hematology

## 2023-07-12 ENCOUNTER — Encounter: Payer: Self-pay | Admitting: Neurology

## 2023-07-12 DIAGNOSIS — G20A1 Parkinson's disease without dyskinesia, without mention of fluctuations: Secondary | ICD-10-CM

## 2023-07-18 ENCOUNTER — Telehealth: Payer: Self-pay | Admitting: Gastroenterology

## 2023-07-18 NOTE — Telephone Encounter (Signed)
Inbound call from patient friend requesting a f/u call to discuss upcoming procedure due to patient having a possible schedule conflict. Please advise.   Thank you

## 2023-07-19 NOTE — Telephone Encounter (Signed)
The pt has a chemo appt 2 days prior to her ERCP and she has asked to change the ERCP to October. I did make the pt aware that she should keep the 8/29 ERCP appt since we have no appts prior to October and her stent needs to be removed.  She has agreed to call and move the chemo treatment to a day or two after the ERCP. She will let me know if she can not make that change.

## 2023-07-20 ENCOUNTER — Other Ambulatory Visit (HOSPITAL_COMMUNITY): Payer: Self-pay

## 2023-07-21 NOTE — Assessment & Plan Note (Addendum)
-  stage IB (T2, N0, Mx), diagnosed in April 2024 -Presented to the emergency room on 4/17 with jaundice and dark urine -Baseline bilirubin elevated at 8.6 -CT scan showed pancreatic head lesion with involvement of the distal CBD and associated biliary ductal dilation.  No evidence of lymphadenopathy or metastatic disease to the liver -Had ERCP and stent placement on 4/19 -Underwent EUS by Dr. Meridee Score on 04/08/23 showing 30 x 23 mm pancreatic head lesion without any vascular involvement.  -The final pathology showed pancreatic adenocarcinoma -She has met pancreatobiliary surgeon Dr. Donell Beers, and she declined the surgery.  Patient understands that her cancer is not curable without surgery. -I encouraged her to consider chemotherapy for 3 to 6 months, with consolidation radiation -Patient has decided to start chemotherapy gemcitabine and cisplatin every 2 weeks.  She started on 05/27/23, she has received a total of 4 doses of today. -plan to repeat CT after today's treatment  -Her tumor marker CA 19.9 has dropped significantly since she started chemo, she is likely responding to treatment. -She is scheduled for biliary stent exchange and fiducial placement by Dr. Meridee Score on August 08, 2023

## 2023-07-22 ENCOUNTER — Inpatient Hospital Stay: Payer: Medicare PPO

## 2023-07-22 ENCOUNTER — Other Ambulatory Visit (HOSPITAL_COMMUNITY): Payer: Self-pay

## 2023-07-22 ENCOUNTER — Other Ambulatory Visit: Payer: Self-pay

## 2023-07-22 ENCOUNTER — Inpatient Hospital Stay: Payer: Medicare PPO | Admitting: Hematology

## 2023-07-22 ENCOUNTER — Encounter: Payer: Self-pay | Admitting: Hematology

## 2023-07-22 ENCOUNTER — Inpatient Hospital Stay: Payer: Medicare PPO | Attending: Physician Assistant

## 2023-07-22 VITALS — BP 123/70 | HR 84 | Temp 97.8°F | Resp 18 | Ht 66.0 in | Wt 143.8 lb

## 2023-07-22 VITALS — BP 150/81 | HR 75

## 2023-07-22 DIAGNOSIS — Z452 Encounter for adjustment and management of vascular access device: Secondary | ICD-10-CM | POA: Diagnosis not present

## 2023-07-22 DIAGNOSIS — R5383 Other fatigue: Secondary | ICD-10-CM | POA: Diagnosis not present

## 2023-07-22 DIAGNOSIS — Z95828 Presence of other vascular implants and grafts: Secondary | ICD-10-CM

## 2023-07-22 DIAGNOSIS — Z5111 Encounter for antineoplastic chemotherapy: Secondary | ICD-10-CM | POA: Insufficient documentation

## 2023-07-22 DIAGNOSIS — Z79899 Other long term (current) drug therapy: Secondary | ICD-10-CM | POA: Insufficient documentation

## 2023-07-22 DIAGNOSIS — R110 Nausea: Secondary | ICD-10-CM | POA: Insufficient documentation

## 2023-07-22 DIAGNOSIS — C250 Malignant neoplasm of head of pancreas: Secondary | ICD-10-CM | POA: Diagnosis present

## 2023-07-22 DIAGNOSIS — R630 Anorexia: Secondary | ICD-10-CM | POA: Diagnosis not present

## 2023-07-22 LAB — CBC WITH DIFFERENTIAL (CANCER CENTER ONLY)
Abs Immature Granulocytes: 0.03 10*3/uL (ref 0.00–0.07)
Basophils Absolute: 0 10*3/uL (ref 0.0–0.1)
Basophils Relative: 1 %
Eosinophils Absolute: 0.4 10*3/uL (ref 0.0–0.5)
Eosinophils Relative: 6 %
HCT: 36.8 % (ref 36.0–46.0)
Hemoglobin: 12.4 g/dL (ref 12.0–15.0)
Immature Granulocytes: 0 %
Lymphocytes Relative: 16 %
Lymphs Abs: 1.1 10*3/uL (ref 0.7–4.0)
MCH: 32 pg (ref 26.0–34.0)
MCHC: 33.7 g/dL (ref 30.0–36.0)
MCV: 94.8 fL (ref 80.0–100.0)
Monocytes Absolute: 1.3 10*3/uL — ABNORMAL HIGH (ref 0.1–1.0)
Monocytes Relative: 18 %
Neutro Abs: 4.1 10*3/uL (ref 1.7–7.7)
Neutrophils Relative %: 59 %
Platelet Count: 296 10*3/uL (ref 150–400)
RBC: 3.88 MIL/uL (ref 3.87–5.11)
RDW: 13.3 % (ref 11.5–15.5)
WBC Count: 7 10*3/uL (ref 4.0–10.5)
nRBC: 0 % (ref 0.0–0.2)

## 2023-07-22 LAB — CMP (CANCER CENTER ONLY)
ALT: <5 U/L (ref 0–44)
AST: 14 U/L — ABNORMAL LOW (ref 15–41)
Albumin: 3.8 g/dL (ref 3.5–5.0)
Alkaline Phosphatase: 99 U/L (ref 38–126)
Anion gap: 8 (ref 5–15)
BUN: 25 mg/dL — ABNORMAL HIGH (ref 8–23)
CO2: 28 mmol/L (ref 22–32)
Calcium: 9.1 mg/dL (ref 8.9–10.3)
Chloride: 104 mmol/L (ref 98–111)
Creatinine: 1.13 mg/dL — ABNORMAL HIGH (ref 0.44–1.00)
GFR, Estimated: 50 mL/min — ABNORMAL LOW (ref >60)
Glucose, Bld: 155 mg/dL — ABNORMAL HIGH (ref 70–99)
Potassium: 3.3 mmol/L — ABNORMAL LOW (ref 3.5–5.1)
Sodium: 140 mmol/L (ref 135–145)
Total Bilirubin: 0.7 mg/dL (ref 0.3–1.2)
Total Protein: 6.9 g/dL (ref 6.5–8.1)

## 2023-07-22 MED ORDER — ALTEPLASE 2 MG IJ SOLR: 2.0000 mg | Freq: Once | INTRAMUSCULAR | Status: DC | PRN

## 2023-07-22 MED ORDER — SODIUM CHLORIDE 0.9% FLUSH
10.0000 mL | INTRAVENOUS | Status: DC | PRN
  Administered 2023-07-22: 10 mL

## 2023-07-22 MED ORDER — DRONABINOL 2.5 MG PO CAPS: 2.5000 mg | ORAL_CAPSULE | Freq: Two times a day (BID) | ORAL | 0 refills | Status: DC

## 2023-07-22 MED ORDER — SODIUM CHLORIDE 0.9 % IV SOLN: INTRAVENOUS | Status: AC

## 2023-07-22 MED ORDER — HEPARIN SOD (PORK) LOCK FLUSH 100 UNIT/ML IV SOLN
500.0000 [IU] | Freq: Once | INTRAVENOUS | Status: AC | PRN
  Administered 2023-07-22: 500 [IU]

## 2023-07-22 MED ORDER — SODIUM CHLORIDE 0.9 % IV SOLN: INTRAVENOUS | Status: DC

## 2023-07-22 NOTE — Progress Notes (Signed)
Nutrition Follow-up:  Patient with pancreatic cancer followed by Dr [name].  Receiving gemcitabine and abraxane.   Met with patient and friend during IV fluids, chemo held today.  Patient reports poor appetite, nausea and mouth sores.  Mouth sores have improved.  Has been drinking ensure shakes (30 g protein) daily.  Also eating ice cream, soups, fruit, yogurt, peanut butter, cottage cheese.      Medications: marinol being added today  Labs: reviewed  Anthropometrics:   Weight 143 lb 12.8 oz today 150 lb on 7/29 148 lb 4.8 oz on 7/15   NUTRITION DIAGNOSIS: Inadequate oral intake ongoing    INTERVENTION:  Encouraged patient to increase ensure (350 calorie) shake 2-3 times per day if possible for additional calories and protein  Encouraged nibbling q 2 hours     MONITORING, EVALUATION, GOAL: weight trends, intake   NEXT VISIT: Wednesday, Sept 4 during infusion   B. Freida Busman, RD, LDN Registered Dietitian [phone]

## 2023-07-22 NOTE — Patient Instructions (Signed)

## 2023-07-22 NOTE — Progress Notes (Signed)
[facility]   Telephone:([phone] Fax:([phone]) [phone]   Clinic Follow up Note   Patient Care Team: [name], MD as PCP - General (Family Medicine) Malachy Mood, MD as Consulting Physician (Oncology)  Date of Service:  07/22/2023  CHIEF COMPLAINT: f/u of Pancreatic Cancer    CURRENT THERAPY:  Pancreatic Abraxane D1,15 + Gemcitabine D1,15, q28d   ASSESSMENT:  Marie Ford is a 77 y.o. female with   Pancreatic cancer (HCC) -stage IB (T2, N0, Mx), diagnosed in April 2024 -Presented to the emergency room on 4/17 with jaundice and dark urine -Baseline bilirubin elevated at 8.6 -CT scan showed pancreatic head lesion with involvement of the distal CBD and associated biliary ductal dilation.  No evidence of lymphadenopathy or metastatic disease to the liver -Had ERCP and stent placement on 4/19 -Underwent EUS by Dr. Meridee Score on 04/08/23 showing 30 x 23 mm pancreatic head lesion without any vascular involvement.  -The final pathology showed pancreatic adenocarcinoma -She has met pancreatobiliary surgeon Dr. Donell Beers, and she declined the surgery.  Patient understands that her cancer is not curable without surgery. -I encouraged her to consider chemotherapy for 3 to 6 months, with consolidation radiation -Patient has decided to start chemotherapy gemcitabine and cisplatin every 2 weeks.  She started on 05/27/23, she has received a total of 4 doses of today. -plan to repeat CT after today's treatment  -Her tumor marker CA 19.9 has dropped significantly since she started chemo, she is likely responding to treatment. -She is scheduled for biliary stent exchange and fiducial placement by Dr. Meridee Score on August 08, 2023 -due to worsening fatigue, will hold chemo today and reschedule to next week with dose reduction -repeat CT next week, if no progression, will give 1-2 more cycles chemo then proceed with SBRT  -pt and her daughter had many questions, all answered to their  satisfaction   Fatigue, nausea and anorexia  -Secondary to chemotherapy -She has tried a Zofran and Compazine and Phenergan for nausea, and did not help -She also has low appetite from chemotherapy -She is interested in marijuana, I called in Marinol for her  PLAN: - ok to begin PT per neurologist  - skip treatment today and give fluids today - ordered CT Pancrease in 1 week - prescribed Miranol 2.5 mg for anorexia  - f/u in 1 week - nutrition consult next week - reduce dosage of chemo next time    SUMMARY OF ONCOLOGIC HISTORY: Oncology History Overview Note   Cancer Staging  Pancreatic cancer [facility]) Staging form: Exocrine Pancreas, AJCC 8th Edition - Clinical: Stage IB (cT2, cN0, cM0) - Signed by Heilingoetter, Cassandra L, PA-C on 04/12/2023 Total positive nodes: 0     Pancreatic cancer (HCC)  03/27/2023 Imaging   CT ABDOMEN PELVIS W CONTRAST   IMPRESSION: Low-attenuation mass centered along the pancreatic head with involvement of the distal common duct with the associated biliary duct ductal dilatation and pancreatic atrophy. Changes are worrisome for neoplasm either a pancreatic neoplasm or cholangiocarcinoma. Recommend further evaluation.   No liver metastases. No pathologically enlarged upper abdominal nodes.   Diffuse colonic stool.  Normal appendix.  Few diverticula.   Portions of the pelvis are obscured by the streak artifact from the bilateral hip arthroplasties.   03/29/2023 Imaging   DG ERCP   IMPRESSION: Intraoperative fluoroscopic images show dilated intra or extrahepatic bile ducts with high-grade stenosis at the level of the distal CBD. Biliary tree is decompressed following placement of common bile duct stent.  04/08/2023 Pathology Results   CYTOLOGY - NON PAP  CASE: [id number]  PATIENT: Marie Ford  Non-Gynecological Cytology Report   FINAL MICROSCOPIC DIAGNOSIS:  A. PANCREAS, HEAD, FINE NEEDLE ASPIRATION:  - Malignant cells present  (see Comment)      04/08/2023 Procedure   EUS by Dr. Meridee Score   EUS impression  - A mass was identified in the pancreatic head/ genu of the pancreas. Cytology results are pending. However, the endosonographic appearance is highly suspicious for adenocarcinoma. This was staged T2 N0 Mx by endosonographic criteria. The staging applies if malignancy is confirmed. Fine needle biopsy performed.  - There was dilation in the common bile duct and in the common hepatic duct which measured up to 8 mm.  - One stent was visualized endosonographically within the common bile duct.  - Hyperechoic material consistent with sludge was visualized endosonographically in the gallbladder.  - Two enlarged lymph nodes were visualized in the celiac region ( level 20) and porta hepatis region. Tissue has not been obtained. However, the endosonographic appearance is suggestive of benign inflammatory change based on the visualization endosonographically.   04/12/2023 Initial Diagnosis   Pancreatic cancer (HCC)   04/12/2023 Cancer Staging   Staging form: Exocrine Pancreas, AJCC 8th Edition - Clinical: Stage IB (cT2, cN0, cM0) - Signed by Heilingoetter, Cassandra L, PA-C on 04/12/2023 Total positive nodes: 0   04/12/2023 Tumor Marker   Patient's tumor was tested for the following markers: CA 19.9. Results of the tumor marker test revealed 512.   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  Report date is 05/03/2023.   The CancerNext-Expanded gene panel offered by [facility] and includes sequencing, rearrangement, and RNA analysis for the following 71 genes:  AIP, ALK, APC, ATM, BAP1, BARD1, BMPR1A, BRCA1, BRCA2, BRIP1, CDC73, CDH1, CDK4, CDKN1B, CDKN2A, CHEK2, DICER1, FH, FLCN, KIF1B, LZTR1,MAX, MEN1, MET, MLH1, MSH2, MSH6, MUTYH, NF1, NF2, NTHL1, PALB2, PHOX2B, PMS2, POT1, PRKAR1A, PTCH1, PTEN, RAD51C,RAD51D, RB1, RET, SDHA, SDHAF2, SDHB, SDHC, SDHD, SMAD4, SMARCA4, SMARCB1,  SMARCE1, STK11, SUFU, TMEM127, TP53,TSC1, TSC2 and VHL (sequencing and deletion/duplication); AXIN2, CTNNA1, EGFR, EGLN1, HOXB13, KIT, MITF, MSH3, PDGFRA, POLD1 and POLE (sequencing only); EPCAM and GREM1 (deletion/duplication only).     05/27/2023 -  Chemotherapy   Patient is on Treatment Plan : PANCREATIC Abraxane D1,8,15 + Gemcitabine D1,8,15 q28d        INTERVAL HISTORY:  CHANDLER MANG is here for a follow up of  Pancreatic Cancer. She was last seen by me  on 06/24/2023. She presents to the clinic accompanie family friend. She reports she is very tired. She is nauseas. Reports no pain after eating. She has thrown up a few times. She is down 6 lbs since last visit. No energy.      All other systems were reviewed with the patient and are negative.  MEDICAL HISTORY:  Past Medical History:  Diagnosis Date   Chronic kidney disease    stage 3    Chronic sphenoidal sinusitis    Degenerative arthritis of hip    requirde surgery 2009   Depression 2010   Diplopia 2011   in left lateral gaze    Diplopia    " my eyes dont track together, i have corrective prisms in my eye glasses"    Diverticulosis 2006   noted in a colonoscopy   Esophageal reflux 2013   Hypertension    IGT (impaired glucose tolerance) 2011   denies hx of diabetes  Insomnia 2009   Medical history non-contributory    Mixed hyperlipidemia 2000   Osteoarthritis    Osteopenia 2005   Other and unspecified hyperlipidemia 2000   Other specified disease of sebaceous glands    Palpitations 2009   secondary to PSVT   Parkinson disease    PNA (pneumonia) 2014   Psoriasis 2004   PSVT (paroxysmal supraventricular tachycardia) 2009   "report it has been some time since i had that "    Situational stress    Urinary tract infection    thru wed 05/15/23, completed 3d antibiotics   Venous insufficiency since 2017    SURGICAL HISTORY: Past Surgical History:  Procedure Laterality Date   BILIARY BRUSHING  03/29/2023    Procedure: BILIARY BRUSHING;  Surgeon: Hilarie Fredrickson, MD;  Location: [facility] ENDOSCOPY;  Service: Gastroenterology;;   BILIARY STENT PLACEMENT  03/29/2023   Procedure: BILIARY STENT PLACEMENT;  Surgeon: Hilarie Fredrickson, MD;  Location: [facility] ENDOSCOPY;  Service: Gastroenterology;;   BIOPSY  04/08/2023   Procedure: BIOPSY;  Surgeon: Lemar Lofty., MD;  Location: [facility] ENDOSCOPY;  Service: Gastroenterology;;   CATARACT EXTRACTION W/ INTRAOCULAR LENS  IMPLANT, BILATERAL     COLONOSCOPY  01/30/2010   Jarold Motto   ERCP N/A 03/29/2023   Procedure: ENDOSCOPIC RETROGRADE CHOLANGIOPANCREATOGRAPHY (ERCP);  Surgeon: Hilarie Fredrickson, MD;  Location: [facility] ENDOSCOPY;  Service: Gastroenterology;  Laterality: N/A;   ESOPHAGOGASTRODUODENOSCOPY (EGD) WITH PROPOFOL N/A 04/08/2023   Procedure: ESOPHAGOGASTRODUODENOSCOPY (EGD) WITH PROPOFOL;  Surgeon: Meridee Score Netty Starring., MD;  Location: [facility] ENDOSCOPY;  Service: Gastroenterology;  Laterality: N/A;   ETHMOIDECTOMY Left 11/17/2020   Procedure: LEFT SIDED TOTAL ETHMOIDECTOMY;  Surgeon: Drema Halon, MD;  Location: [facility];  Service: ENT;  Laterality: Left;   EUS N/A 04/08/2023   Procedure: UPPER ENDOSCOPIC ULTRASOUND (EUS) LINEAR;  Surgeon: Lemar Lofty., MD;  Location: [facility] ENDOSCOPY;  Service: Gastroenterology;  Laterality: N/A;   EYE SURGERY     FINE NEEDLE ASPIRATION N/A 04/08/2023   Procedure: FINE NEEDLE ASPIRATION (FNA) LINEAR;  Surgeon: Lemar Lofty., MD;  Location: [facility] ENDOSCOPY;  Service: Gastroenterology;  Laterality: N/A;   HEMORRHOID SURGERY     IR IMAGING GUIDED PORT INSERTION  05/17/2023   JOINT REPLACEMENT     LASIK     left medial and superior rectus muscle recesion for diplopia correction     SINUS ENDO WITH FUSION Left 11/17/2020   Procedure: SINUS ENDOSCOPY WITH FUSION NAVIGATION;  Surgeon: Drema Halon, MD;  Location: [facility];  Service: ENT;  Laterality: Left;   SPHENOIDECTOMY Left 11/17/2020    Procedure: LEFT SIDED SPHENOIDECTOMY WITH TISSUE REMOVAL;  Surgeon: Drema Halon, MD;  Location: [facility];  Service: ENT;  Laterality: Left;   SPHINCTEROTOMY  03/29/2023   Procedure: SPHINCTEROTOMY;  Surgeon: Hilarie Fredrickson, MD;  Location: [facility] ENDOSCOPY;  Service: Gastroenterology;;   surgery of right eye rectus muscles for diplpia correction   2019   TOTAL HIP ARTHROPLASTY Right 2010   TOTAL HIP ARTHROPLASTY Left 08/19/2019   Procedure: TOTAL HIP ARTHROPLASTY ANTERIOR APPROACH;  Surgeon: Ollen Gross, MD;  Location: [facility] ORS;  Service: Orthopedics;  Laterality: Left;    TURBINATE REDUCTION Left 11/17/2020   Procedure: LEFT SIDED TURBINATE REDUCTION;  Surgeon: Drema Halon, MD;  Location: [facility];  Service: ENT;  Laterality: Left;   URETHRAL SLING     midurethral sling with TVT Exact and Cystoscopy    I have reviewed  the social history and family history with the patient and they are unchanged from previous note.  ALLERGIES:  is allergic to erythromycin, macrobid [nitrofurantoin], keflex [cephalexin], levofloxacin, penicillins, and sulfamethoxazole-trimethoprim.  MEDICATIONS:  Current Outpatient Medications  Medication Sig Dispense Refill   dronabinol (MARINOL) 2.5 MG capsule Take 1 capsule (2.5 mg total) by mouth 2 (two) times daily before a meal. 30 capsule 0   ALPRAZolam (XANAX) 0.5 MG tablet Take 0.25 mg by mouth as needed for anxiety. Takes as needed for sleep     AUVELITY 45-105 MG TBCR Take 1 tablet by mouth every evening.     buPROPion (WELLBUTRIN XL) 150 MG 24 hr tablet Take 150 mg by mouth daily.     Calcium Carbonate Antacid (CALCIUM CARBONATE PO) Take by mouth.     carbidopa-levodopa (SINEMET IR) 25-100 MG tablet Take 1.5 tablets by mouth every 4 (four) hours as needed. -Try to separate Sinemet from food (especially protein-rich foods like meat, dairy, eggs) by about 30-60 mins - this will help the absorption of the  medication. If you have some nausea with the medication, you can take it with some light food like crackers or ginger ale 540 tablet 4   donepezil (ARICEPT) 10 MG tablet Take 1 tablet (10 mg total) by mouth at bedtime. 30 tablet 5   lidocaine-prilocaine (EMLA) cream Apply to affected area once 30 g 3   losartan (COZAAR) 50 MG tablet Take 1 tablet (50 mg total) by mouth daily. 90 tablet 1   magic mouthwash (multi-ingredient) oral suspension Take 5 mLs by mouth 4 (four) times daily. 240 mL 1   Multiple Vitamin (MULTIVITAMIN WITH MINERALS) TABS tablet Take 1 tablet by mouth daily. Women's Multivitamin     OVER THE COUNTER MEDICATION Take 1 tablet by mouth daily as needed (constipation).     pantoprazole (PROTONIX) 40 MG tablet Take 40 mg by mouth daily.     Polyethyl Glycol-Propyl Glycol (SYSTANE) 0.4-0.3 % SOLN Place 1 drop into both eyes 3 (three) times daily as needed (dry/irritated eyes.).     polyethylene glycol (MIRALAX / GLYCOLAX) 17 g packet Take 17 g by mouth daily.     pravastatin (PRAVACHOL) 80 MG tablet Take 1 tablet (80 mg total) by mouth every evening.     prochlorperazine (COMPAZINE) 10 MG tablet Take 1 tablet (10 mg total) by mouth every 6 (six) hours as needed for nausea or vomiting. 30 tablet 1   promethazine (PHENERGAN) 25 MG tablet Take 25 mg by mouth every 6 (six) hours as needed for nausea or vomiting.     triamcinolone (NASACORT) 55 MCG/ACT AERO nasal inhaler Place 2 sprays into the nose daily. Use at night (Patient taking differently: Place 1 spray into the nose daily. Use at night) 1 each 12   No current facility-administered medications for this visit.   Facility-Administered Medications Ordered in Other Visits  Medication Dose Route Frequency Provider Last Rate Last Admin   0.9 %  sodium chloride infusion   Intravenous Continuous Malachy Mood, MD   Stopped at 07/22/23 1241   alteplase (CATHFLO ACTIVASE) injection 2 mg  2 mg Intracatheter Once PRN Malachy Mood, MD       sodium  chloride flush (NS) 0.9 % injection 10 mL  10 mL Intracatheter PRN Malachy Mood, MD   10 mL at 07/22/23 1241    PHYSICAL EXAMINATION: ECOG PERFORMANCE STATUS: 2 - Symptomatic, <50% confined to bed  Vitals:   07/22/23 0944  BP: 123/70  Pulse: 84  Resp: 18  Temp: 97.8 F (36.6 C)  SpO2: 99%   Wt Readings from Last 3 Encounters:  07/22/23 143 lb 12.8 oz (65.2 kg)  07/08/23 150 lb (68 kg)  06/24/23 148 lb 4.8 oz (67.3 kg)     GENERAL:alert, no distress and comfortable SKIN: skin color, texture, turgor are normal, no rashes or significant lesions EYES: normal, Conjunctiva are pink and non-injected, sclera clear NECK: supple, thyroid normal size, non-tender, without nodularity LYMPH:  no palpable lymphadenopathy in the cervical, axillary  LUNGS: clear to auscultation and percussion with normal breathing effort HEART: regular rate & rhythm and no murmurs and no lower extremity edema ABDOMEN:abdomen soft, non-tender and normal bowel sounds Musculoskeletal:no cyanosis of digits and no clubbing  NEURO: alert & oriented x 3 with fluent speech, no focal motor/sensory deficits  LABORATORY DATA:  I have reviewed the data as listed    Latest Ref Rng & Units 07/22/2023    9:17 AM 07/08/2023   12:09 PM 06/24/2023    1:29 PM  CBC  WBC 4.0 - 10.5 K/uL 7.0  8.2  5.5   Hemoglobin 12.0 - 15.0 g/dL 16.1  09.6  04.5   Hematocrit 36.0 - 46.0 % 36.8  34.6  33.7   Platelets 150 - 400 K/uL 296  280  256         Latest Ref Rng & Units 07/22/2023    9:17 AM 07/08/2023   12:09 PM 06/24/2023    1:29 PM  CMP  Glucose 70 - 99 mg/dL 409  811  914   BUN 8 - 23 mg/dL 25  29  27    Creatinine 0.44 - 1.00 mg/dL 7.82  9.56  2.13   Sodium 135 - 145 mmol/L 140  139  139   Potassium 3.5 - 5.1 mmol/L 3.3  4.0  3.8   Chloride 98 - 111 mmol/L 104  106  107   CO2 22 - 32 mmol/L 28  26  27    Calcium 8.9 - 10.3 mg/dL 9.1  9.3  9.0   Total Protein 6.5 - 8.1 g/dL 6.9  6.7  6.7   Total Bilirubin 0.3 - 1.2 mg/dL 0.7   0.6  0.6   Alkaline Phos 38 - 126 U/L 99  138  122   AST 15 - 41 U/L 14  19  42   ALT 0 - 44 U/L 5  11  7        RADIOGRAPHIC STUDIES: I have personally reviewed the radiological images as listed and agreed with the findings in the report. No results found.    Orders Placed This Encounter  Procedures   CT PANCREAS ABDOMEN W WO CONTRAST    Standing Status:   Future    Standing Expiration Date:   07/21/2024    Order Specific Question:   If indicated for the ordered procedure, I authorize the administration of contrast media per Radiology protocol    Answer:   Yes    Order Specific Question:   Does the patient have a contrast media/X-ray dye allergy?    Answer:   No    Order Specific Question:   Preferred imaging location?    Answer:   [facility]   CBC with Differential (Cancer Center Only)    Standing Status:   Future    Standing Expiration Date:   07/28/2024   CMP (Cancer Center only)    Standing Status:   Future    Standing Expiration Date:  07/28/2024   All questions were answered. The patient knows to call the clinic with any problems, questions or concerns. No barriers to learning was detected. The total time spent in the appointment was 40 minutes.     Malachy Mood, MD 07/22/2023   I, Sharlette Dense, CMA, am acting as scribe for Malachy Mood, MD.   I have reviewed the above documentation for accuracy and completeness, and I agree with the above.

## 2023-07-28 NOTE — Assessment & Plan Note (Signed)
-  stage IB (T2, N0, Mx), diagnosed in April 2024 -Presented to the emergency room on 4/17 with jaundice and dark urine -Baseline bilirubin elevated at 8.6 -CT scan showed pancreatic head lesion with involvement of the distal CBD and associated biliary ductal dilation.  No evidence of lymphadenopathy or metastatic disease to the liver -Had ERCP and stent placement on 4/19 -Underwent EUS by Dr. Meridee Score on 04/08/23 showing 30 x 23 mm pancreatic head lesion without any vascular involvement.  -The final pathology showed pancreatic adenocarcinoma -She has met pancreatobiliary surgeon Dr. Donell Beers, and she declined the surgery.  Patient understands that her cancer is not curable without surgery. -I encouraged her to consider chemotherapy for 3 to 6 months, with consolidation radiation -Patient has decided to start chemotherapy gemcitabine and cisplatin every 2 weeks.  She started on 05/27/23, she has received a total of 4 doses of today. -plan to repeat CT on 8/26 -Her tumor marker CA 19.9 has dropped significantly since she started chemo, she is likely responding to treatment. -She is scheduled for biliary stent exchange and fiducial placement by Dr. Meridee Score on August 08, 2023

## 2023-07-28 NOTE — Assessment & Plan Note (Signed)
-  On carbidopa-levodopa, follow-up with neurology

## 2023-07-29 ENCOUNTER — Inpatient Hospital Stay: Payer: Medicare PPO | Admitting: Hematology

## 2023-07-29 ENCOUNTER — Other Ambulatory Visit: Payer: Self-pay

## 2023-07-29 ENCOUNTER — Inpatient Hospital Stay: Payer: Medicare PPO

## 2023-07-29 ENCOUNTER — Telehealth: Payer: Self-pay | Admitting: Neurology

## 2023-07-29 ENCOUNTER — Encounter: Payer: Self-pay | Admitting: Hematology

## 2023-07-29 VITALS — BP 160/78 | HR 65 | Temp 97.6°F | Resp 18 | Wt 145.0 lb

## 2023-07-29 DIAGNOSIS — C250 Malignant neoplasm of head of pancreas: Secondary | ICD-10-CM

## 2023-07-29 DIAGNOSIS — G20A1 Parkinson's disease without dyskinesia, without mention of fluctuations: Secondary | ICD-10-CM | POA: Diagnosis not present

## 2023-07-29 DIAGNOSIS — Z5111 Encounter for antineoplastic chemotherapy: Secondary | ICD-10-CM | POA: Diagnosis not present

## 2023-07-29 DIAGNOSIS — Z95828 Presence of other vascular implants and grafts: Secondary | ICD-10-CM

## 2023-07-29 LAB — CBC WITH DIFFERENTIAL (CANCER CENTER ONLY)
Abs Immature Granulocytes: 0.06 10*3/uL (ref 0.00–0.07)
Basophils Absolute: 0.1 10*3/uL (ref 0.0–0.1)
Basophils Relative: 1 %
Eosinophils Absolute: 0.2 10*3/uL (ref 0.0–0.5)
Eosinophils Relative: 2 %
HCT: 37.8 % (ref 36.0–46.0)
Hemoglobin: 12.8 g/dL (ref 12.0–15.0)
Immature Granulocytes: 1 %
Lymphocytes Relative: 16 %
Lymphs Abs: 1.4 10*3/uL (ref 0.7–4.0)
MCH: 32.6 pg (ref 26.0–34.0)
MCHC: 33.9 g/dL (ref 30.0–36.0)
MCV: 96.2 fL (ref 80.0–100.0)
Monocytes Absolute: 1.1 10*3/uL — ABNORMAL HIGH (ref 0.1–1.0)
Monocytes Relative: 13 %
Neutro Abs: 5.6 10*3/uL (ref 1.7–7.7)
Neutrophils Relative %: 67 %
Platelet Count: 459 10*3/uL — ABNORMAL HIGH (ref 150–400)
RBC: 3.93 MIL/uL (ref 3.87–5.11)
RDW: 13.6 % (ref 11.5–15.5)
WBC Count: 8.5 10*3/uL (ref 4.0–10.5)
nRBC: 0 % (ref 0.0–0.2)

## 2023-07-29 LAB — CMP (CANCER CENTER ONLY)
ALT: <5 U/L (ref 0–44)
AST: 13 U/L — ABNORMAL LOW (ref 15–41)
Albumin: 3.8 g/dL (ref 3.5–5.0)
Alkaline Phosphatase: 92 U/L (ref 38–126)
Anion gap: 7 (ref 5–15)
BUN: 28 mg/dL — ABNORMAL HIGH (ref 8–23)
CO2: 29 mmol/L (ref 22–32)
Calcium: 9.3 mg/dL (ref 8.9–10.3)
Chloride: 104 mmol/L (ref 98–111)
Creatinine: 1.16 mg/dL — ABNORMAL HIGH (ref 0.44–1.00)
GFR, Estimated: 48 mL/min — ABNORMAL LOW (ref >60)
Glucose, Bld: 117 mg/dL — ABNORMAL HIGH (ref 70–99)
Potassium: 3.9 mmol/L (ref 3.5–5.1)
Sodium: 140 mmol/L (ref 135–145)
Total Bilirubin: 0.6 mg/dL (ref 0.3–1.2)
Total Protein: 7 g/dL (ref 6.5–8.1)

## 2023-07-29 MED ORDER — SODIUM CHLORIDE 0.9% FLUSH: 10.0000 mL | INTRAVENOUS | Status: DC | PRN

## 2023-07-29 MED ORDER — ALTEPLASE 2 MG IJ SOLR
2.0000 mg | Freq: Once | INTRAMUSCULAR | Status: AC | PRN
  Administered 2023-07-29: 2 mg
  Filled 2023-07-29: qty 2

## 2023-07-29 MED ORDER — DRONABINOL 2.5 MG PO CAPS: 2.5000 mg | ORAL_CAPSULE | Freq: Two times a day (BID) | ORAL | 0 refills | Status: DC

## 2023-07-29 MED ORDER — SODIUM CHLORIDE 0.9 % IV SOLN
800.0000 mg/m2 | Freq: Once | INTRAVENOUS | Status: AC
  Administered 2023-07-29: 1406 mg via INTRAVENOUS
  Filled 2023-07-29: qty 36.98

## 2023-07-29 MED ORDER — PACLITAXEL PROTEIN-BOUND CHEMO INJECTION 100 MG
100.0000 mg/m2 | Freq: Once | INTRAVENOUS | Status: AC
  Administered 2023-07-29: 175 mg via INTRAVENOUS
  Filled 2023-07-29: qty 35

## 2023-07-29 MED ORDER — SODIUM CHLORIDE 0.9 % IV SOLN: Freq: Once | INTRAVENOUS | Status: AC

## 2023-07-29 MED ORDER — PROCHLORPERAZINE MALEATE 10 MG PO TABS
10.0000 mg | ORAL_TABLET | Freq: Once | ORAL | Status: AC
  Administered 2023-07-29: 10 mg via ORAL
  Filled 2023-07-29: qty 1

## 2023-07-29 NOTE — Addendum Note (Signed)
Addended by: Malachy Mood on: 07/29/2023 11:16 AM   Modules accepted: Orders

## 2023-07-29 NOTE — Telephone Encounter (Signed)
Thanks

## 2023-07-29 NOTE — Progress Notes (Signed)
[facility]   Telephone:(336) [phone] Fax:(336) [phone]   Clinic Follow up Note   Patient Care Team: [name], MD as PCP - General (Family Medicine) Malachy Mood, MD as Consulting Physician (Oncology)  Date of Service:  07/29/2023  CHIEF COMPLAINT: f/u of Pancreatic Cancer   CURRENT THERAPY:  Pancreatic Abraxane D1,15 + Gemcitabine D1,15, q28d   ASSESSMENT:  Marie Ford is a 77 y.o. female with   Pancreatic cancer (HCC) -stage IB (T2, N0, Mx), diagnosed in April 2024 -Presented to the emergency room on 4/17 with jaundice and dark urine -Baseline bilirubin elevated at 8.6 -CT scan showed pancreatic head lesion with involvement of the distal CBD and associated biliary ductal dilation.  No evidence of lymphadenopathy or metastatic disease to the liver -Had ERCP and stent placement on 4/19 -Underwent EUS by Dr. Meridee Score on 04/08/23 showing 30 x 23 mm pancreatic head lesion without any vascular involvement.  -The final pathology showed pancreatic adenocarcinoma -She has met pancreatobiliary surgeon Dr. Donell Beers, and she declined the surgery.  Patient understands that her cancer is not curable without surgery. -I encouraged her to consider chemotherapy for 3 to 6 months, with consolidation radiation -Patient has decided to start chemotherapy gemcitabine and cisplatin every 2 weeks.  She started on 05/27/23, she has received a total of 4 doses of today. -plan to repeat CT on 8/26 -Her tumor marker CA 19.9 has dropped significantly since she started chemo, she is likely responding to treatment. -She is scheduled for biliary stent exchange and fiducial placement by Dr. Meridee Score on August 08, 2023  Parkinson disease -On carbidopa-levodopa, follow-up with neurology     PLAN: -lab reviewed -CMP-pending -I refill Marinol to increase appetite -proceed with treatment today with reduced dose due to tolerance issue  -lab/flush and treatment 9/4   SUMMARY OF  ONCOLOGIC HISTORY: Oncology History Overview Note   Cancer Staging  Pancreatic cancer [facility]) Staging form: Exocrine Pancreas, AJCC 8th Edition - Clinical: Stage IB (cT2, cN0, cM0) - Signed by Heilingoetter, Cassandra L, PA-C on 04/12/2023 Total positive nodes: 0     Pancreatic cancer (HCC)  03/27/2023 Imaging   CT ABDOMEN PELVIS W CONTRAST   IMPRESSION: Low-attenuation mass centered along the pancreatic head with involvement of the distal common duct with the associated biliary duct ductal dilatation and pancreatic atrophy. Changes are worrisome for neoplasm either a pancreatic neoplasm or cholangiocarcinoma. Recommend further evaluation.   No liver metastases. No pathologically enlarged upper abdominal nodes.   Diffuse colonic stool.  Normal appendix.  Few diverticula.   Portions of the pelvis are obscured by the streak artifact from the bilateral hip arthroplasties.   03/29/2023 Imaging   DG ERCP   IMPRESSION: Intraoperative fluoroscopic images show dilated intra or extrahepatic bile ducts with high-grade stenosis at the level of the distal CBD. Biliary tree is decompressed following placement of common bile duct stent.   04/08/2023 Pathology Results   CYTOLOGY - NON PAP  CASE: [id number]  PATIENT: Marie Ford  Non-Gynecological Cytology Report   FINAL MICROSCOPIC DIAGNOSIS:  A. PANCREAS, HEAD, FINE NEEDLE ASPIRATION:  - Malignant cells present (see Comment)      04/08/2023 Procedure   EUS by Dr. Meridee Score   EUS impression  - A mass was identified in the pancreatic head/ genu of the pancreas. Cytology results are pending. However, the endosonographic appearance is highly suspicious for adenocarcinoma. This was staged T2 N0 Mx by endosonographic criteria. The staging applies if malignancy is confirmed. Fine needle biopsy performed.  -  There was dilation in the common bile duct and in the common hepatic duct which measured up to 8 mm.  - One stent was visualized  endosonographically within the common bile duct.  - Hyperechoic material consistent with sludge was visualized endosonographically in the gallbladder.  - Two enlarged lymph nodes were visualized in the celiac region ( level 20) and porta hepatis region. Tissue has not been obtained. However, the endosonographic appearance is suggestive of benign inflammatory change based on the visualization endosonographically.   04/12/2023 Initial Diagnosis   Pancreatic cancer (HCC)   04/12/2023 Cancer Staging   Staging form: Exocrine Pancreas, AJCC 8th Edition - Clinical: Stage IB (cT2, cN0, cM0) - Signed by Heilingoetter, Cassandra L, PA-C on 04/12/2023 Total positive nodes: 0   04/12/2023 Tumor Marker   Patient's tumor was tested for the following markers: CA 19.9. Results of the tumor marker test revealed 512.   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  Report date is 05/03/2023.   The CancerNext-Expanded gene panel offered by [employer] and includes sequencing, rearrangement, and RNA analysis for the following 71 genes:  AIP, ALK, APC, ATM, BAP1, BARD1, BMPR1A, BRCA1, BRCA2, BRIP1, CDC73, CDH1, CDK4, CDKN1B, CDKN2A, CHEK2, DICER1, FH, FLCN, KIF1B, LZTR1,MAX, MEN1, MET, MLH1, MSH2, MSH6, MUTYH, NF1, NF2, NTHL1, PALB2, PHOX2B, PMS2, POT1, PRKAR1A, PTCH1, PTEN, RAD51C,RAD51D, RB1, RET, SDHA, SDHAF2, SDHB, SDHC, SDHD, SMAD4, SMARCA4, SMARCB1, SMARCE1, STK11, SUFU, TMEM127, TP53,TSC1, TSC2 and VHL (sequencing and deletion/duplication); AXIN2, CTNNA1, EGFR, EGLN1, HOXB13, KIT, MITF, MSH3, PDGFRA, POLD1 and POLE (sequencing only); EPCAM and GREM1 (deletion/duplication only).     05/27/2023 -  Chemotherapy   Patient is on Treatment Plan : PANCREATIC Abraxane D1,8,15 + Gemcitabine D1,8,15 q28d        INTERVAL HISTORY:  Marie Ford is here for a follow up of Pancreatic Cancer. She was last seen by me on 07/22/2023. She presents to the clinic accompanied by friend.  Pt stated that the Marinol helped with her appetite and and how she handles her nausea.     All other systems were reviewed with the patient and are negative.  MEDICAL HISTORY:  Past Medical History:  Diagnosis Date   Chronic kidney disease    stage 3    Chronic sphenoidal sinusitis    Degenerative arthritis of hip    requirde surgery 2009   Depression 2010   Diplopia 2011   in left lateral gaze    Diplopia    " my eyes dont track together, i have corrective prisms in my eye glasses"    Diverticulosis 2006   noted in a colonoscopy   Esophageal reflux 2013   Hypertension    IGT (impaired glucose tolerance) 2011   denies hx of diabetes    Insomnia 2009   Medical history non-contributory    Mixed hyperlipidemia 2000   Osteoarthritis    Osteopenia 2005   Other and unspecified hyperlipidemia 2000   Other specified disease of sebaceous glands    Palpitations 2009   secondary to PSVT   Parkinson disease    PNA (pneumonia) 2014   Psoriasis 2004   PSVT (paroxysmal supraventricular tachycardia) 2009   "report it has been some time since i had that "    Situational stress    Urinary tract infection    thru wed 05/15/23, completed 3d antibiotics   Venous insufficiency since 2017    SURGICAL HISTORY: Past Surgical History:  Procedure Laterality Date   BILIARY BRUSHING  03/29/2023   Procedure: BILIARY BRUSHING;  Surgeon: Hilarie Fredrickson, MD;  Location: [facility] ENDOSCOPY;  Service: Gastroenterology;;   BILIARY STENT PLACEMENT  03/29/2023   Procedure: BILIARY STENT PLACEMENT;  Surgeon: Hilarie Fredrickson, MD;  Location: [facility] ENDOSCOPY;  Service: Gastroenterology;;   BIOPSY  04/08/2023   Procedure: BIOPSY;  Surgeon: Lemar Lofty., MD;  Location: [facility];  Service: Gastroenterology;;   CATARACT EXTRACTION W/ INTRAOCULAR LENS  IMPLANT, BILATERAL     COLONOSCOPY  01/30/2010   Jarold Motto   ERCP N/A 03/29/2023   Procedure: ENDOSCOPIC RETROGRADE CHOLANGIOPANCREATOGRAPHY (ERCP);  Surgeon:  Hilarie Fredrickson, MD;  Location: [facility] ENDOSCOPY;  Service: Gastroenterology;  Laterality: N/A;   ESOPHAGOGASTRODUODENOSCOPY (EGD) WITH PROPOFOL N/A 04/08/2023   Procedure: ESOPHAGOGASTRODUODENOSCOPY (EGD) WITH PROPOFOL;  Surgeon: Meridee Score Netty Starring., MD;  Location: [facility] ENDOSCOPY;  Service: Gastroenterology;  Laterality: N/A;   ETHMOIDECTOMY Left 11/17/2020   Procedure: LEFT SIDED TOTAL ETHMOIDECTOMY;  Surgeon: Drema Halon, MD;  Location: [facility];  Service: ENT;  Laterality: Left;   EUS N/A 04/08/2023   Procedure: UPPER ENDOSCOPIC ULTRASOUND (EUS) LINEAR;  Surgeon: Lemar Lofty., MD;  Location: [facility] ENDOSCOPY;  Service: Gastroenterology;  Laterality: N/A;   EYE SURGERY     FINE NEEDLE ASPIRATION N/A 04/08/2023   Procedure: FINE NEEDLE ASPIRATION (FNA) LINEAR;  Surgeon: Lemar Lofty., MD;  Location: [facility] ENDOSCOPY;  Service: Gastroenterology;  Laterality: N/A;   HEMORRHOID SURGERY     IR IMAGING GUIDED PORT INSERTION  05/17/2023   JOINT REPLACEMENT     LASIK     left medial and superior rectus muscle recesion for diplopia correction     SINUS ENDO WITH FUSION Left 11/17/2020   Procedure: SINUS ENDOSCOPY WITH FUSION NAVIGATION;  Surgeon: Drema Halon, MD;  Location: [facility];  Service: ENT;  Laterality: Left;   SPHENOIDECTOMY Left 11/17/2020   Procedure: LEFT SIDED SPHENOIDECTOMY WITH TISSUE REMOVAL;  Surgeon: Drema Halon, MD;  Location: [facility];  Service: ENT;  Laterality: Left;   SPHINCTEROTOMY  03/29/2023   Procedure: SPHINCTEROTOMY;  Surgeon: Hilarie Fredrickson, MD;  Location: [facility] ENDOSCOPY;  Service: Gastroenterology;;   surgery of right eye rectus muscles for diplpia correction   2019   TOTAL HIP ARTHROPLASTY Right 2010   TOTAL HIP ARTHROPLASTY Left 08/19/2019   Procedure: TOTAL HIP ARTHROPLASTY ANTERIOR APPROACH;  Surgeon: Ollen Gross, MD;  Location: [facility];  Service: Orthopedics;  Laterality: Left;     TURBINATE REDUCTION Left 11/17/2020   Procedure: LEFT SIDED TURBINATE REDUCTION;  Surgeon: Drema Halon, MD;  Location: [facility];  Service: ENT;  Laterality: Left;   URETHRAL SLING     midurethral sling with TVT Exact and Cystoscopy    I have reviewed the social history and family history with the patient and they are unchanged from previous note.  ALLERGIES:  is allergic to erythromycin, macrobid [nitrofurantoin], keflex [cephalexin], levofloxacin, penicillins, and sulfamethoxazole-trimethoprim.  MEDICATIONS:  Current Outpatient Medications  Medication Sig Dispense Refill   ALPRAZolam (XANAX) 0.5 MG tablet Take 0.25 mg by mouth as needed for anxiety. Takes as needed for sleep     AUVELITY 45-105 MG TBCR Take 1 tablet by mouth every evening.     buPROPion (WELLBUTRIN XL) 150 MG 24 hr tablet Take 150 mg by mouth daily.     Calcium Carbonate Antacid (CALCIUM CARBONATE PO) Take by mouth.     carbidopa-levodopa (SINEMET IR) 25-100 MG tablet Take 1.5 tablets by  mouth every 4 (four) hours as needed. -Try to separate Sinemet from food (especially protein-rich foods like meat, dairy, eggs) by about 30-60 mins - this will help the absorption of the medication. If you have some nausea with the medication, you can take it with some light food like crackers or ginger ale 540 tablet 4   donepezil (ARICEPT) 10 MG tablet Take 1 tablet (10 mg total) by mouth at bedtime. 30 tablet 5   dronabinol (MARINOL) 2.5 MG capsule Take 1 capsule (2.5 mg total) by mouth 2 (two) times daily before a meal. 30 capsule 0   lidocaine-prilocaine (EMLA) cream Apply to affected area once 30 g 3   losartan (COZAAR) 50 MG tablet Take 1 tablet (50 mg total) by mouth daily. 90 tablet 1   magic mouthwash (multi-ingredient) oral suspension Take 5 mLs by mouth 4 (four) times daily. 240 mL 1   Multiple Vitamin (MULTIVITAMIN WITH MINERALS) TABS tablet Take 1 tablet by mouth daily. Women's Multivitamin     OVER  THE COUNTER MEDICATION Take 1 tablet by mouth daily as needed (constipation).     pantoprazole (PROTONIX) 40 MG tablet Take 40 mg by mouth daily.     Polyethyl Glycol-Propyl Glycol (SYSTANE) 0.4-0.3 % SOLN Place 1 drop into both eyes 3 (three) times daily as needed (dry/irritated eyes.).     polyethylene glycol (MIRALAX / GLYCOLAX) 17 g packet Take 17 g by mouth daily.     pravastatin (PRAVACHOL) 80 MG tablet Take 1 tablet (80 mg total) by mouth every evening.     prochlorperazine (COMPAZINE) 10 MG tablet Take 1 tablet (10 mg total) by mouth every 6 (six) hours as needed for nausea or vomiting. 30 tablet 1   promethazine (PHENERGAN) 25 MG tablet Take 25 mg by mouth every 6 (six) hours as needed for nausea or vomiting.     triamcinolone (NASACORT) 55 MCG/ACT AERO nasal inhaler Place 2 sprays into the nose daily. Use at night (Patient taking differently: Place 1 spray into the nose daily. Use at night) 1 each 12   No current facility-administered medications for this visit.   Facility-Administered Medications Ordered in Other Visits  Medication Dose Route Frequency Provider Last Rate Last Admin   0.9 %  sodium chloride infusion   Intravenous Once Malachy Mood, MD       gemcitabine (GEMZAR) 1,406 mg in sodium chloride 0.9 % 250 mL chemo infusion  800 mg/m2 (Treatment Plan Recorded) Intravenous Once Malachy Mood, MD       PACLitaxel-protein bound (ABRAXANE) chemo infusion 175 mg  100 mg/m2 (Treatment Plan Recorded) Intravenous Once Malachy Mood, MD        PHYSICAL EXAMINATION: ECOG PERFORMANCE STATUS: 1 - Symptomatic but completely ambulatory  There were no vitals filed for this visit. Wt Readings from Last 3 Encounters:  07/29/23 145 lb (65.8 kg)  07/22/23 143 lb 12.8 oz (65.2 kg)  07/08/23 150 lb (68 kg)     GENERAL:alert, no distress and comfortable SKIN: skin color normal, no rashes or significant lesions EYES: normal, Conjunctiva are pink and non-injected, sclera clear  NEURO: alert & oriented  x 3 with fluent speech  LABORATORY DATA:  I have reviewed the data as listed    Latest Ref Rng & Units 07/29/2023    9:20 AM 07/22/2023    9:17 AM 07/08/2023   12:09 PM  CBC  WBC 4.0 - 10.5 K/uL 8.5  7.0  8.2   Hemoglobin 12.0 - 15.0 g/dL 30.8  12.4  11.8   Hematocrit 36.0 - 46.0 % 37.8  36.8  34.6   Platelets 150 - 400 K/uL 459  296  280         Latest Ref Rng & Units 07/29/2023    9:20 AM 07/22/2023    9:17 AM 07/08/2023   12:09 PM  CMP  Glucose 70 - 99 mg/dL 161  096  045   BUN 8 - 23 mg/dL 28  25  29    Creatinine 0.44 - 1.00 mg/dL 4.09  8.11  9.14   Sodium 135 - 145 mmol/L 140  140  139   Potassium 3.5 - 5.1 mmol/L 3.9  3.3  4.0   Chloride 98 - 111 mmol/L 104  104  106   CO2 22 - 32 mmol/L 29  28  26    Calcium 8.9 - 10.3 mg/dL 9.3  9.1  9.3   Total Protein 6.5 - 8.1 g/dL 7.0  6.9  6.7   Total Bilirubin 0.3 - 1.2 mg/dL 0.6  0.7  0.6   Alkaline Phos 38 - 126 U/L 92  99  138   AST 15 - 41 U/L 13  14  19    ALT 0 - 44 U/L <5  <5  11       RADIOGRAPHIC STUDIES: I have personally reviewed the radiological images as listed and agreed with the findings in the report. No results found.    No orders of the defined types were placed in this encounter.  All questions were answered. The patient knows to call the clinic with any problems, questions or concerns. No barriers to learning was detected. The total time spent in the appointment was 30 minutes.     Malachy Mood, MD 07/29/2023   Carolin Coy, CMA, am acting as scribe for Malachy Mood, MD.   I have reviewed the above documentation for accuracy and completeness, and I agree with the above.

## 2023-07-29 NOTE — Patient Instructions (Signed)
[facility]  Discharge Instructions: Thank you for choosing [facility] to provide your oncology and hematology care.   If you have a lab appointment with the Cancer Center, please go directly to the Cancer Center and check in at the registration area.   Wear comfortable clothing and clothing appropriate for easy access to any Portacath or PICC line.   We strive to give you quality time with your provider. You may need to reschedule your appointment if you arrive late (15 or more minutes).  Arriving late affects you and other patients whose appointments are after yours.  Also, if you miss three or more appointments without notifying the office, you may be dismissed from the clinic at the provider's discretion.      For prescription refill requests, have your pharmacy contact our office and allow 72 hours for refills to be completed.    Today you received the following chemotherapy and/or immunotherapy agents Abraxane, Gemzar      To help prevent nausea and vomiting after your treatment, we encourage you to take your nausea medication as directed.  BELOW ARE SYMPTOMS THAT SHOULD BE REPORTED IMMEDIATELY: *FEVER GREATER THAN 100.4 F (38 C) OR HIGHER *CHILLS OR SWEATING *NAUSEA AND VOMITING THAT IS NOT CONTROLLED WITH YOUR NAUSEA MEDICATION *UNUSUAL SHORTNESS OF BREATH *UNUSUAL BRUISING OR BLEEDING *URINARY PROBLEMS (pain or burning when urinating, or frequent urination) *BOWEL PROBLEMS (unusual diarrhea, constipation, pain near the anus) TENDERNESS IN MOUTH AND THROAT WITH OR WITHOUT PRESENCE OF ULCERS (sore throat, sores in mouth, or a toothache) UNUSUAL RASH, SWELLING OR PAIN  UNUSUAL VAGINAL DISCHARGE OR ITCHING   Items with * indicate a potential emergency and should be followed up as soon as possible or go to the Emergency Department if any problems should occur.  Please show the CHEMOTHERAPY ALERT CARD or IMMUNOTHERAPY ALERT CARD at  check-in to the Emergency Department and triage nurse.  Should you have questions after your visit or need to cancel or reschedule your appointment, please contact [facility]  Dept: [phone]  and follow the prompts.  Office hours are 8:00 a.m. to 4:30 p.m. Monday - Friday. Please note that voicemails left after 4:00 p.m. may not be returned until the following business day.  We are closed weekends and major holidays. You have access to a nurse at all times for urgent questions. Please call the main number to the clinic Dept: [phone] and follow the prompts.   For any non-urgent questions, you may also contact your provider using MyChart. We now offer e-Visits for anyone 18 and older to request care online for non-urgent symptoms. For details visit [email or web address].   Also download the MyChart app! Go to the app store, search "MyChart", open the app, select [facility], and log in with your MyChart username and password.   

## 2023-07-29 NOTE — Telephone Encounter (Signed)
Referral for physical therapy fax to [facility]. Phone: [phone], Fax: [phone].

## 2023-07-30 ENCOUNTER — Encounter: Payer: Medicare PPO | Admitting: Dietician

## 2023-07-30 LAB — CANCER ANTIGEN 19-9: CA 19-9: 67 U/mL — ABNORMAL HIGH (ref 0–35)

## 2023-07-31 ENCOUNTER — Encounter (HOSPITAL_COMMUNITY): Payer: Self-pay | Admitting: Gastroenterology

## 2023-07-31 NOTE — Progress Notes (Addendum)
Anesthesia Review:  PCP: [name]  Cardiologist : none  Chest x-ray : CT chest- 04/24/23  EKG : Echo : 2015 \ CT Card- 2022  Stress test: Cardiac Cath :  Activity level:  Sleep Study/ CPAP : none  Fasting Blood Sugar :      / Checks Blood Sugar -- times a day:   Blood Thinner/ Instructions /Last Dose: ASA / Instructions/ Last Dose :    07/29/2023- cbc/diff amd cmp     PORT  LVMM on 07/31/23 in regards to preprocedure instructons for 08/08/23 procedure.    PT called back on 07/31/23 and med hx and preprocedure instructons reviewed.  PT to call office of DR  Mansouraty and inquire in regards to npo status and if she may have liquids until a certain time due to procedure in the pm.

## 2023-08-05 ENCOUNTER — Encounter: Payer: Medicare PPO | Admitting: Nutrition

## 2023-08-05 ENCOUNTER — Ambulatory Visit: Payer: Medicare PPO | Admitting: Nurse Practitioner

## 2023-08-05 ENCOUNTER — Ambulatory Visit: Payer: Medicare PPO

## 2023-08-05 ENCOUNTER — Other Ambulatory Visit: Payer: Medicare PPO

## 2023-08-05 ENCOUNTER — Ambulatory Visit (HOSPITAL_COMMUNITY)
Admission: RE | Admit: 2023-08-05 | Discharge: 2023-08-05 | Disposition: A | Discharge status: Home / Self Care | Payer: Medicare PPO | Source: Ambulatory Visit | Attending: Hematology | Admitting: Hematology

## 2023-08-05 DIAGNOSIS — C250 Malignant neoplasm of head of pancreas: Secondary | ICD-10-CM | POA: Insufficient documentation

## 2023-08-05 MED ORDER — IOHEXOL 300 MG/ML  SOLN
100.0000 mL | Freq: Once | INTRAMUSCULAR | Status: AC | PRN
  Administered 2023-08-05: 100 mL via INTRAVENOUS

## 2023-08-08 ENCOUNTER — Ambulatory Visit (HOSPITAL_COMMUNITY)
Admission: RE | Admit: 2023-08-08 | Discharge: 2023-08-08 | Disposition: A | Discharge status: Home / Self Care | Payer: Medicare PPO | Source: Home / Self Care | Attending: Gastroenterology | Admitting: Gastroenterology

## 2023-08-08 ENCOUNTER — Ambulatory Visit (HOSPITAL_COMMUNITY): Payer: Medicare PPO

## 2023-08-08 ENCOUNTER — Other Ambulatory Visit: Payer: Self-pay

## 2023-08-08 ENCOUNTER — Ambulatory Visit (HOSPITAL_COMMUNITY): Payer: Medicare PPO | Admitting: Registered Nurse

## 2023-08-08 ENCOUNTER — Encounter (HOSPITAL_COMMUNITY)
Admission: RE | Disposition: A | Discharge status: Home / Self Care | Payer: Self-pay | Source: Home / Self Care | Attending: Gastroenterology

## 2023-08-08 ENCOUNTER — Ambulatory Visit (HOSPITAL_BASED_OUTPATIENT_CLINIC_OR_DEPARTMENT_OTHER): Payer: Medicare PPO | Admitting: Registered Nurse

## 2023-08-08 ENCOUNTER — Encounter (HOSPITAL_COMMUNITY): Payer: Self-pay | Admitting: Gastroenterology

## 2023-08-08 DIAGNOSIS — E782 Mixed hyperlipidemia: Secondary | ICD-10-CM | POA: Insufficient documentation

## 2023-08-08 DIAGNOSIS — I10 Essential (primary) hypertension: Secondary | ICD-10-CM

## 2023-08-08 DIAGNOSIS — I81 Portal vein thrombosis: Secondary | ICD-10-CM | POA: Insufficient documentation

## 2023-08-08 DIAGNOSIS — C540 Malignant neoplasm of isthmus uteri: Secondary | ICD-10-CM

## 2023-08-08 DIAGNOSIS — C259 Malignant neoplasm of pancreas, unspecified: Secondary | ICD-10-CM

## 2023-08-08 DIAGNOSIS — F418 Other specified anxiety disorders: Secondary | ICD-10-CM | POA: Diagnosis not present

## 2023-08-08 DIAGNOSIS — K831 Obstruction of bile duct: Secondary | ICD-10-CM | POA: Diagnosis not present

## 2023-08-08 DIAGNOSIS — Z87440 Personal history of urinary (tract) infections: Secondary | ICD-10-CM | POA: Diagnosis not present

## 2023-08-08 DIAGNOSIS — Z4659 Encounter for fitting and adjustment of other gastrointestinal appliance and device: Secondary | ICD-10-CM

## 2023-08-08 DIAGNOSIS — G20A1 Parkinson's disease without dyskinesia, without mention of fluctuations: Secondary | ICD-10-CM | POA: Diagnosis not present

## 2023-08-08 DIAGNOSIS — N183 Chronic kidney disease, stage 3 unspecified: Secondary | ICD-10-CM | POA: Diagnosis not present

## 2023-08-08 DIAGNOSIS — C250 Malignant neoplasm of head of pancreas: Secondary | ICD-10-CM | POA: Insufficient documentation

## 2023-08-08 DIAGNOSIS — M8580 Other specified disorders of bone density and structure, unspecified site: Secondary | ICD-10-CM | POA: Diagnosis not present

## 2023-08-08 DIAGNOSIS — T85590A Other mechanical complication of bile duct prosthesis, initial encounter: Secondary | ICD-10-CM | POA: Diagnosis not present

## 2023-08-08 DIAGNOSIS — K219 Gastro-esophageal reflux disease without esophagitis: Secondary | ICD-10-CM | POA: Insufficient documentation

## 2023-08-08 DIAGNOSIS — I129 Hypertensive chronic kidney disease with stage 1 through stage 4 chronic kidney disease, or unspecified chronic kidney disease: Secondary | ICD-10-CM | POA: Insufficient documentation

## 2023-08-08 HISTORY — PX: BILIARY DILATION: SHX6850

## 2023-08-08 HISTORY — PX: REMOVAL OF STONES: SHX5545

## 2023-08-08 HISTORY — PX: BILIARY STENT PLACEMENT: SHX5538

## 2023-08-08 HISTORY — PX: ENDOSCOPIC RETROGRADE CHOLANGIOPANCREATOGRAPHY (ERCP) WITH PROPOFOL: SHX5810

## 2023-08-08 HISTORY — PX: FIDUCIAL MARKER PLACEMENT: SHX6858

## 2023-08-08 HISTORY — PX: EUS: SHX5427

## 2023-08-08 HISTORY — PX: STENT REMOVAL: SHX6421

## 2023-08-08 HISTORY — PX: ESOPHAGOGASTRODUODENOSCOPY: SHX5428

## 2023-08-08 SURGERY — ENDOSCOPIC RETROGRADE CHOLANGIOPANCREATOGRAPHY (ERCP) WITH PROPOFOL
Anesthesia: General

## 2023-08-08 MED ORDER — DICLOFENAC SUPPOSITORY 100 MG
RECTAL | Status: DC | PRN
  Administered 2023-08-08: 100 mg via RECTAL

## 2023-08-08 MED ORDER — SUGAMMADEX SODIUM 200 MG/2ML IV SOLN
INTRAVENOUS | Status: DC | PRN
  Administered 2023-08-08: 140 mg via INTRAVENOUS

## 2023-08-08 MED ORDER — FENTANYL CITRATE (PF) 100 MCG/2ML IJ SOLN: 25.0000 ug | Freq: Once | INTRAMUSCULAR | Status: DC

## 2023-08-08 MED ORDER — GLUCAGON HCL RDNA (DIAGNOSTIC) 1 MG IJ SOLR
INTRAMUSCULAR | Status: AC
  Filled 2023-08-08: qty 1

## 2023-08-08 MED ORDER — DEXAMETHASONE SODIUM PHOSPHATE 10 MG/ML IJ SOLN
INTRAMUSCULAR | Status: DC | PRN
  Administered 2023-08-08: 4 mg via INTRAVENOUS

## 2023-08-08 MED ORDER — EPHEDRINE SULFATE-NACL 50-0.9 MG/10ML-% IV SOSY
PREFILLED_SYRINGE | INTRAVENOUS | Status: DC | PRN
  Administered 2023-08-08: 5 mg via INTRAVENOUS

## 2023-08-08 MED ORDER — DICLOFENAC SUPPOSITORY 100 MG
RECTAL | Status: AC
  Filled 2023-08-08: qty 1

## 2023-08-08 MED ORDER — PROPOFOL 10 MG/ML IV BOLUS
INTRAVENOUS | Status: DC | PRN
  Administered 2023-08-08: 100 mg via INTRAVENOUS

## 2023-08-08 MED ORDER — SODIUM CHLORIDE 0.9 % IV SOLN: INTRAVENOUS | Status: DC

## 2023-08-08 MED ORDER — CIPROFLOXACIN IN D5W 400 MG/200ML IV SOLN
INTRAVENOUS | Status: AC
  Filled 2023-08-08: qty 200

## 2023-08-08 MED ORDER — LIDOCAINE 2% (20 MG/ML) 5 ML SYRINGE
INTRAMUSCULAR | Status: DC | PRN
  Administered 2023-08-08: 30 mg via INTRAVENOUS

## 2023-08-08 MED ORDER — FENTANYL CITRATE (PF) 100 MCG/2ML IJ SOLN
INTRAMUSCULAR | Status: AC
  Filled 2023-08-08: qty 2

## 2023-08-08 MED ORDER — FENTANYL CITRATE (PF) 100 MCG/2ML IJ SOLN
INTRAMUSCULAR | Status: DC | PRN
  Administered 2023-08-08: 50 ug via INTRAVENOUS

## 2023-08-08 MED ORDER — ROCURONIUM BROMIDE 10 MG/ML (PF) SYRINGE
PREFILLED_SYRINGE | INTRAVENOUS | Status: DC | PRN
  Administered 2023-08-08: 40 mg via INTRAVENOUS

## 2023-08-08 MED ORDER — ONDANSETRON HCL 4 MG/2ML IJ SOLN
INTRAMUSCULAR | Status: DC | PRN
  Administered 2023-08-08: 4 mg via INTRAVENOUS

## 2023-08-08 MED ORDER — LACTATED RINGERS IV SOLN: INTRAVENOUS | Status: DC | PRN

## 2023-08-08 MED ORDER — CIPROFLOXACIN IN D5W 400 MG/200ML IV SOLN
INTRAVENOUS | Status: DC | PRN
  Administered 2023-08-08: 400 mg via INTRAVENOUS

## 2023-08-08 MED ORDER — PHENYLEPHRINE 80 MCG/ML (10ML) SYRINGE FOR IV PUSH (FOR BLOOD PRESSURE SUPPORT)
PREFILLED_SYRINGE | INTRAVENOUS | Status: DC | PRN
  Administered 2023-08-08 (×4): 80 ug via INTRAVENOUS

## 2023-08-08 MED ORDER — PROPOFOL 10 MG/ML IV BOLUS
INTRAVENOUS | Status: AC
  Filled 2023-08-08: qty 20

## 2023-08-08 MED ORDER — LACTATED RINGERS IV SOLN: INTRAVENOUS | Status: DC

## 2023-08-08 MED ORDER — SODIUM CHLORIDE 0.9 % IV SOLN
INTRAVENOUS | Status: DC | PRN
  Administered 2023-08-08: 15 mL

## 2023-08-08 NOTE — Anesthesia Procedure Notes (Signed)
Procedure Name: Intubation Date/Time: 08/08/2023 2:30 PM  Performed by: Nelle Don, CRNAPre-anesthesia Checklist: Patient identified, Emergency Drugs available, Suction available and Patient being monitored Patient Re-evaluated:Patient Re-evaluated prior to induction Oxygen Delivery Method: Circle system utilized Preoxygenation: Pre-oxygenation with 100% oxygen Induction Type: IV induction Ventilation: Mask ventilation without difficulty Laryngoscope Size: Mac and 4 Grade View: Grade I Tube type: Oral Tube size: 7.0 mm Number of attempts: 1 Airway Equipment and Method: Stylet Placement Confirmation: ETT inserted through vocal cords under direct vision, positive ETCO2 and breath sounds checked- equal and bilateral Secured at: 22 cm Tube secured with: Tape Dental Injury: Teeth and Oropharynx as per pre-operative assessment

## 2023-08-08 NOTE — Discharge Instructions (Signed)
YOU HAD AN ENDOSCOPIC PROCEDURE TODAY: Refer to the procedure report and other information in the discharge instructions given to you for any specific questions about what was found during the examination. If this information does not answer your questions, please call [place] office at [phone] to clarify.   YOU SHOULD EXPECT: Some feelings of bloating in the abdomen. Passage of more gas than usual. Walking can help get rid of the air that was put into your GI tract during the procedure and reduce the bloating. If you had a lower endoscopy (such as a colonoscopy or flexible sigmoidoscopy) you may notice spotting of blood in your stool or on the toilet paper. Some abdominal soreness may be present for a day or two, also.  DIET: Your first meal following the procedure should be a light meal and then it is ok to progress to your normal diet. A half-sandwich or bowl of soup is an example of a good first meal. Heavy or fried foods are harder to digest and may make you feel nauseous or bloated. Drink plenty of fluids but you should avoid alcoholic beverages for 24 hours. If you had a esophageal dilation, please see attached instructions for diet.    ACTIVITY: Your care partner should take you home directly after the procedure. You should plan to take it easy, moving slowly for the rest of the day. You can resume normal activity the day after the procedure however YOU SHOULD NOT DRIVE, use power tools, machinery or perform tasks that involve climbing or major physical exertion for 24 hours (because of the sedation medicines used during the test).   SYMPTOMS TO REPORT IMMEDIATELY: A gastroenterologist can be reached at any hour. Please call [phone]  for any of the following symptoms:   Following upper endoscopy (EGD, EUS, ERCP, esophageal dilation) Vomiting of blood or coffee ground material  New, significant abdominal pain  New, significant chest pain or pain under the shoulder blades  Painful or  persistently difficult swallowing  New shortness of breath  Black, tarry-looking or red, bloody stools  FOLLOW UP:  If any biopsies were taken you will be contacted by phone or by letter within the next 1-3 weeks. Call [phone]  if you have not heard about the biopsies in 3 weeks.  Please also call with any specific questions about appointments or follow up tests.  

## 2023-08-08 NOTE — Anesthesia Preprocedure Evaluation (Addendum)
Anesthesia Evaluation  Patient identified by MRN, date of birth, ID band Patient awake    Reviewed: Allergy & Precautions, NPO status , Patient's Chart, lab work & pertinent test results, Unable to perform ROS - Chart review only  Airway Mallampati: III  TM Distance: >3 FB Neck ROM: Full    Dental  (+) Teeth Intact, Dental Advisory Given, Caps,    Pulmonary    Pulmonary exam normal breath sounds clear to auscultation       Cardiovascular hypertension, Pt. on medications Normal cardiovascular exam Rhythm:Regular Rate:Normal     Neuro/Psych  PSYCHIATRIC DISORDERS Anxiety Depression       GI/Hepatic Neg liver ROS,GERD  Medicated and Controlled,,  Endo/Other  negative endocrine ROS    Renal/GU Renal disease     Musculoskeletal  (+) Arthritis ,    Abdominal   Peds  Hematology negative hematology ROS (+)   Anesthesia Other Findings   Reproductive/Obstetrics                             Anesthesia Physical Anesthesia Plan  ASA: 3  Anesthesia Plan: General   Post-op Pain Management: Minimal or no pain anticipated   Induction: Intravenous  PONV Risk Score and Plan: 3 and Ondansetron, Dexamethasone and Treatment may vary due to age or medical condition  Airway Management Planned: Oral ETT  Additional Equipment: None  Intra-op Plan:   Post-operative Plan: Extubation in OR  Informed Consent: I have reviewed the patients History and Physical, chart, labs and discussed the procedure including the risks, benefits and alternatives for the proposed anesthesia with the patient or authorized representative who has indicated his/her understanding and acceptance.     Dental advisory given  Plan Discussed with: CRNA  Anesthesia Plan Comments:         Anesthesia Quick Evaluation

## 2023-08-08 NOTE — H&P (Signed)
GASTROENTEROLOGY PROCEDURE H&P NOTE   Primary Care Physician: [name], MD  HPI: Marie Ford is a 77 y.o. female who presents for EUS/ERCP for biliary stent exchange and for fiducial placement in setting of underlying pancreatic cancer.  Past Medical History:  Diagnosis Date   Chronic kidney disease    stage 3    Chronic sphenoidal sinusitis    Degenerative arthritis of hip    requirde surgery 2009   Depression 2010   Diplopia 2011   in left lateral gaze    Diplopia    " my eyes dont track together, i have corrective prisms in my eye glasses"    Diverticulosis 2006   noted in a colonoscopy   Esophageal reflux 2013   Hypertension    IGT (impaired glucose tolerance) 2011   denies hx of diabetes    Insomnia 2009   Medical history non-contributory    Mixed hyperlipidemia 2000   Osteoarthritis    Osteopenia 2005   Other and unspecified hyperlipidemia 2000   Other specified disease of sebaceous glands    Palpitations 2009   secondary to PSVT   Parkinson disease    PNA (pneumonia) 2014   Psoriasis 2004   PSVT (paroxysmal supraventricular tachycardia) 2009   "report it has been some time since i had that "    Situational stress    Urinary tract infection    thru wed 05/15/23, completed 3d antibiotics   Venous insufficiency since 2017   Past Surgical History:  Procedure Laterality Date   BILIARY BRUSHING  03/29/2023   Procedure: BILIARY BRUSHING;  Surgeon: Hilarie Fredrickson, MD;  Location: [facility] ENDOSCOPY;  Service: Gastroenterology;;   BILIARY STENT PLACEMENT  03/29/2023   Procedure: BILIARY STENT PLACEMENT;  Surgeon: Hilarie Fredrickson, MD;  Location: [facility] ENDOSCOPY;  Service: Gastroenterology;;   BIOPSY  04/08/2023   Procedure: BIOPSY;  Surgeon: Lemar Lofty., MD;  Location: [facility] ENDOSCOPY;  Service: Gastroenterology;;   CATARACT EXTRACTION W/ INTRAOCULAR LENS  IMPLANT, BILATERAL     COLONOSCOPY  01/30/2010   Jarold Motto   ERCP N/A 03/29/2023   Procedure: ENDOSCOPIC  RETROGRADE CHOLANGIOPANCREATOGRAPHY (ERCP);  Surgeon: Hilarie Fredrickson, MD;  Location: [facility] ENDOSCOPY;  Service: Gastroenterology;  Laterality: N/A;   ESOPHAGOGASTRODUODENOSCOPY (EGD) WITH PROPOFOL N/A 04/08/2023   Procedure: ESOPHAGOGASTRODUODENOSCOPY (EGD) WITH PROPOFOL;  Surgeon: Meridee Score Netty Starring., MD;  Location: [facility] ENDOSCOPY;  Service: Gastroenterology;  Laterality: N/A;   ETHMOIDECTOMY Left 11/17/2020   Procedure: LEFT SIDED TOTAL ETHMOIDECTOMY;  Surgeon: Drema Halon, MD;  Location: [facility];  Service: ENT;  Laterality: Left;   EUS N/A 04/08/2023   Procedure: UPPER ENDOSCOPIC ULTRASOUND (EUS) LINEAR;  Surgeon: Lemar Lofty., MD;  Location: [facility] ENDOSCOPY;  Service: Gastroenterology;  Laterality: N/A;   EYE SURGERY     FINE NEEDLE ASPIRATION N/A 04/08/2023   Procedure: FINE NEEDLE ASPIRATION (FNA) LINEAR;  Surgeon: Lemar Lofty., MD;  Location: [facility] ENDOSCOPY;  Service: Gastroenterology;  Laterality: N/A;   HEMORRHOID SURGERY     IR IMAGING GUIDED PORT INSERTION  05/17/2023   JOINT REPLACEMENT     LASIK     left medial and superior rectus muscle recesion for diplopia correction     SINUS ENDO WITH FUSION Left 11/17/2020   Procedure: SINUS ENDOSCOPY WITH FUSION NAVIGATION;  Surgeon: Drema Halon, MD;  Location: [facility];  Service: ENT;  Laterality: Left;   SPHENOIDECTOMY Left 11/17/2020   Procedure: LEFT SIDED SPHENOIDECTOMY WITH TISSUE REMOVAL;  Surgeon: Drema Halon, MD;  Location: [facility];  Service: ENT;  Laterality: Left;   SPHINCTEROTOMY  03/29/2023   Procedure: SPHINCTEROTOMY;  Surgeon: Hilarie Fredrickson, MD;  Location: [facility] ENDOSCOPY;  Service: Gastroenterology;;   surgery of right eye rectus muscles for diplpia correction   2019   TOTAL HIP ARTHROPLASTY Right 2010   TOTAL HIP ARTHROPLASTY Left 08/19/2019   Procedure: TOTAL HIP ARTHROPLASTY ANTERIOR APPROACH;  Surgeon: Ollen Gross, MD;  Location: [facility]  ORS;  Service: Orthopedics;  Laterality: Left;    TURBINATE REDUCTION Left 11/17/2020   Procedure: LEFT SIDED TURBINATE REDUCTION;  Surgeon: Drema Halon, MD;  Location: [facility];  Service: ENT;  Laterality: Left;   URETHRAL SLING     midurethral sling with TVT Exact and Cystoscopy   No current facility-administered medications for this encounter.   No current facility-administered medications for this encounter. Allergies  Allergen Reactions   Erythromycin Shortness Of Breath and Rash   Macrobid [Nitrofurantoin] Other (See Comments)    Pass out   Keflex [Cephalexin] Nausea And Vomiting   Levofloxacin Nausea Only   Penicillins Rash    Medicinal Product Containing Penicillin And Acting As Antibacterial Agent (Product)   Sulfamethoxazole-Trimethoprim Rash   Family History  Problem Relation Age of Onset   Congestive Heart Failure Mother    CVA Father    Heart failure Father    Non-Hodgkin's lymphoma Sister    Alcohol abuse Sister    Liver cancer Brother    Colon cancer Brother 67   Colitis Brother    Hypertension Brother    Gout Brother    Melanoma Daughter    Parkinson's disease Cousin    Esophageal cancer Neg Hx    Rectal cancer Neg Hx    Stomach cancer Neg Hx    Colon polyps Neg Hx    Social History   Socioeconomic History   Marital status: Single    Spouse name: Not on file   Number of children: 1   Years of education: Not on file   Highest education level: Doctorate  Occupational History   Occupation: retired  Tobacco Use   Smoking status: Never   Smokeless tobacco: Never  Vaping Use   Vaping status: Never Used  Substance and Sexual Activity   Alcohol use: Yes    Comment: rarely   Drug use: No   Sexual activity: Not Currently    Birth control/protection: Post-menopausal  Other Topics Concern   Not on file  Social History Narrative   Lives at [facility]   Right handed   Caffeine: rarely   Social  Determinants of Health   Financial Resource Strain: Not on file  Food Insecurity: No Food Insecurity (03/29/2023)   Hunger Vital Sign    Worried About Running Out of Food in the Last Year: Never true    Ran Out of Food in the Last Year: Never true  Transportation Needs: No Transportation Needs (03/29/2023)   PRAPARE - Administrator, Civil Service (Medical): No    Lack of Transportation (Non-Medical): No  Physical Activity: Not on file  Stress: Not on file  Social Connections: Unknown (04/20/2022)   [facility]   Social Network    Social Network: Not on file  Intimate Partner Violence: Not At Risk (03/29/2023)   Humiliation, Afraid, Rape, and Kick questionnaire    Fear of Current or Ex-Partner: No    Emotionally Abused: No  Physically Abused: No    Sexually Abused: No    Physical Exam: There were no vitals filed for this visit. There is no height or weight on file to calculate BMI. GEN: NAD EYE: Sclerae anicteric ENT: MMM CV: Non-tachycardic GI: Soft, NT/ND NEURO:  Alert & Oriented x 3  Lab Results: No results for input(s): "WBC", "HGB", "HCT", "PLT" in the last 72 hours. BMET No results for input(s): "NA", "K", "CL", "CO2", "GLUCOSE", "BUN", "CREATININE", "CALCIUM" in the last 72 hours. LFT No results for input(s): "PROT", "ALBUMIN", "AST", "ALT", "ALKPHOS", "BILITOT", "BILIDIR", "IBILI" in the last 72 hours. PT/INR No results for input(s): "LABPROT", "INR" in the last 72 hours.   Impression / Plan: This is a 77 y.o.female who presents for EUS/ERCP for biliary stent exchange and for fiducial placement in setting of underlying pancreatic cancer.  The risks of an EUS including intestinal perforation, bleeding, infection, aspiration, and medication effects were discussed as was the possibility it may not give a definitive diagnosis if a biopsy is performed.  When a biopsy of the pancreas is done as part of the EUS, there is an  additional risk of pancreatitis at the rate of about 1-2%.  It was explained that procedure related pancreatitis is typically mild, although it can be severe and even life threatening, which is why we do not perform random pancreatic biopsies and only biopsy a lesion/area we feel is concerning enough to warrant the risk.  The risks of an ERCP were discussed at length, including but not limited to the risk of perforation, bleeding, abdominal pain, post-ERCP pancreatitis (while usually mild can be severe and even life threatening).   The risks and benefits of endoscopic evaluation/treatment were discussed with the patient and/or family; these include but are not limited to the risk of perforation, infection, bleeding, missed lesions, lack of diagnosis, severe illness requiring hospitalization, as well as anesthesia and sedation related illnesses.  The patient's history has been reviewed, patient examined, no change in status, and deemed stable for procedure.  The patient and/or family is agreeable to proceed.    Corliss Parish, MD [facility] Office # [phone]

## 2023-08-08 NOTE — Anesthesia Postprocedure Evaluation (Signed)
Anesthesia Post Note  Patient: Marie Ford  Procedure(s) Performed: ENDOSCOPIC RETROGRADE CHOLANGIOPANCREATOGRAPHY (ERCP) WITH PROPOFOL BILIARY STENT PLACEMENT ESOPHAGOGASTRODUODENOSCOPY (EGD) UPPER ENDOSCOPIC ULTRASOUND (EUS) LINEAR FIDUCIAL MARKER PLACEMENT STENT REMOVAL REMOVAL OF SLUDGE BILIARY DILATION     Patient location during evaluation: PACU Anesthesia Type: General Level of consciousness: sedated and patient cooperative Pain management: pain level controlled Vital Signs Assessment: post-procedure vital signs reviewed and stable Respiratory status: spontaneous breathing Cardiovascular status: stable Anesthetic complications: no   No notable events documented.  Last Vitals:  Vitals:   08/08/23 1651 08/08/23 1700  BP: (!) 170/80 (!) 168/85  Pulse: 70 71  Resp: 12 16  Temp:    SpO2: 98% 98%    Last Pain:  Vitals:   08/08/23 1700  TempSrc:   PainSc: 2                  Lewie Loron

## 2023-08-08 NOTE — Transfer of Care (Signed)
Immediate Anesthesia Transfer of Care Note  Patient: Marie Ford  Procedure(s) Performed: ENDOSCOPIC RETROGRADE CHOLANGIOPANCREATOGRAPHY (ERCP) WITH PROPOFOL BILIARY STENT PLACEMENT ESOPHAGOGASTRODUODENOSCOPY (EGD) UPPER ENDOSCOPIC ULTRASOUND (EUS) LINEAR FIDUCIAL MARKER PLACEMENT STENT REMOVAL REMOVAL OF SLUDGE BILIARY DILATION  Patient Location: PACU  Anesthesia Type:General  Level of Consciousness: drowsy  Airway & Oxygen Therapy: Patient Spontanous Breathing and Patient connected to face mask oxygen  Post-op Assessment: Report given to RN, Post -op Vital signs reviewed and stable, and Patient moving all extremities X 4  Post vital signs: Reviewed and stable  Last Vitals:  Vitals Value Taken Time  BP 143/62   Temp    Pulse 64   Resp 12   SpO2 100     Last Pain:  Vitals:   08/08/23 1408  TempSrc: Temporal  PainSc: 0-No pain         Complications: No notable events documented.

## 2023-08-08 NOTE — Op Note (Addendum)
[facility] Patient Name: Marie Ford Procedure Date: 08/08/2023 MRN: [id number] Attending MD: Corliss Parish , MD, [id number] Date of Birth: [birth year] CSN: [id number] Age: 77 Admit Type: Inpatient Procedure:                Upper EUS Indications:              For evaluation of pancreatic adenocarcinoma,                            Fiducial Providers:                Corliss Parish, MD, Norman Clay, RN, Brion Aliment, Technician Referring MD:             Malachy Mood, [name], Wilhemina Bonito Marina Goodell, MD Medicines:                General Anesthesia Complications:            No immediate complications. Estimated Blood Loss:     Estimated blood loss was minimal. Procedure:                Pre-Anesthesia Assessment:                           - Prior to the procedure, a History and Physical                            was performed, and patient medications and                            allergies were reviewed. The patient's tolerance of                            previous anesthesia was also reviewed. The risks                            and benefits of the procedure and the sedation                            options and risks were discussed with the patient.                            All questions were answered, and informed consent                            was obtained. Prior Anticoagulants: The patient has                            taken no anticoagulant or antiplatelet agents. ASA                            Grade Assessment: III - A patient with severe  systemic disease. After reviewing the risks and                            benefits, the patient was deemed in satisfactory                            condition to undergo the procedure.                           After obtaining informed consent, the endoscope was                            passed under direct vision. Throughout the                             procedure, the patient's blood pressure, pulse, and                            oxygen saturations were monitored continuously. The                            [id number] ([id number]) Olympus linear ultrasound scope                            was introduced through the mouth, and advanced to                            the duodenum for ultrasound examination from the                            stomach and duodenum. After obtaining informed                            consent, the endoscope was passed under direct                            vision. Throughout the procedure, the patient's                            blood pressure, pulse, and oxygen saturations were                            monitored continuously. The upper EUS was                            accomplished without difficulty. The patient                            tolerated the procedure. Scope In: Scope Out: Findings:      ENDOSCOPIC FINDING: :      No gross lesions were noted in the entire examined stomach.      No gross lesions were noted in the duodenal bulb.      ENDOSONOGRAPHIC FINDING: :      An irregular mass was identified in the  pancreatic head. The mass was       hypoechoic. The mass measured 20 mm by 14 mm in maximal cross-sectional       diameter. The endosonographic borders were well-defined. There was       sonographic evidence suggesting invasion into the portal vein       (manifested by the presence of thrombus). An intact interface was seen       between the mass and the celiac trunk suggesting a lack of invasion. The       remainder of the pancreas was examined. The endosonographic appearance       of parenchyma and the upstream pancreatic duct indicated duct dilation       and parenchymal atrophy. Fiducial marker placement was performed. Once       the target lesion in the pancreas was identified a preloaded marker in a       22 gauge needle was then deployed in and around the lesion. This was       repeated  for a total of four markers.      There was dilation in the common bile duct which measured up to 12 mm.      One stent was visualized endosonographically in the common bile duct. Impression:               EGD Impression:                           - No gross lesions in the entire stomach.                           - No gross lesions in the duodenal bulb.                           EUS Impression:                           - A mass was identified in the pancreatic head. A                            tissue diagnosis was obtained prior to this exam.                            This is adenocarcinoma. Fiducial marker placement x                            4 was performed. Evidence of a portal vein                            thrombosis as documented in the CT is also noted on                            today's EUS.                           - There was dilation in the common bile duct which  measured up to 12 mm with one biliary stent was                            visualized endosonographically in the common bile                            duct.                           - If anticoagulation is to be considered, would                            wait at least 48 hours before initiation of DOAC or                            Lovenox in the setting of her portal vein                            thrombosis. Will defer this to oncology. Moderate Sedation:      Not Applicable - Patient had care per Anesthesia. Recommendation:           - Proceed to scheduled ERCP for stent exchange.                           - Observe patient's clinical course.                           - May proceed with SBRT as per Radiation Oncology                            and Oncology discussions.                           - The findings and recommendations were discussed                            with the patient.                           - The findings and recommendations were discussed                             with the patient's family. Procedure Code(s):        --- Professional ---                           [id number], Esophagogastroduodenoscopy, flexible,                            transoral; with transendoscopic ultrasound-guided                            transmural injection of diagnostic or therapeutic  substance(s) (eg, anesthetic, neurolytic agent) or                            fiducial marker(s) (includes endoscopic ultrasound                            examination of the esophagus, stomach, and either                            the duodenum or a surgically altered stomach where                            the jejunum is examined distal to the anastomosis) Diagnosis Code(s):        --- Professional ---                           C25.0, Malignant neoplasm of head of pancreas                           C25.9, Malignant neoplasm of pancreas, unspecified CPT copyright 2022 [employer]. All rights reserved. The codes documented in this report are preliminary and upon coder review may  be revised to meet current compliance requirements. Corliss Parish, MD 08/08/2023 3:03:17 PM Number of Addenda: 0

## 2023-08-08 NOTE — Op Note (Signed)
[facility] Patient Name: Marie Ford Procedure Date: 08/08/2023 MRN: [id number] Attending MD: Corliss Parish , MD, [id number] Date of Birth: [birth year] CSN: [id number] Age: 77 Admit Type: Inpatient Procedure:                ERCP Indications:              Malignant stricture of the common bile duct, Stent                            change Providers:                Corliss Parish, MD, Norman Clay, RN, Brion Aliment, Technician Referring MD:             Malachy Mood, [name], Yancey Flemings, MD Medicines:                General Anesthesia, Cipro 400 mg IV, Diclofenac 100                            mg rectal Complications:            No immediate complications. Estimated Blood Loss:     Estimated blood loss was minimal. Procedure:                Pre-Anesthesia Assessment:                           - Prior to the procedure, a History and Physical                            was performed, and patient medications and                            allergies were reviewed. The patient's tolerance of                            previous anesthesia was also reviewed. The risks                            and benefits of the procedure and the sedation                            options and risks were discussed with the patient.                            All questions were answered, and informed consent                            was obtained. Prior Anticoagulants: The patient has                            taken no anticoagulant or antiplatelet agents. ASA  Grade Assessment: III - A patient with severe                            systemic disease. After reviewing the risks and                            benefits, the patient was deemed in satisfactory                            condition to undergo the procedure.                           After obtaining informed consent, the scope was                            passed under  direct vision. Throughout the                            procedure, the patient's blood pressure, pulse, and                            oxygen saturations were monitored continuously. The                            [id number]-Q190V ([id number] Olympus duodenoscope was                            introduced through the mouth, and used to inject                            contrast into and used to inject contrast into the                            bile duct. The ERCP was accomplished without                            difficulty. The patient tolerated the procedure. Scope In: Scope Out: Findings:      A biliary stent was visible on the scout film.      The esophagus was successfully intubated under direct vision without       detailed examination of the pharynx, larynx, and associated structures,       and upper GI tract. A biliary sphincterotomy had been performed. The       sphincterotomy appeared open. One plastic biliary stent originating in       the biliary tree was emerging from the major papilla. The stent was       partially occluded. One stent was removed from the biliary tree using a       snare.      A short 0.035 inch Soft Jagwire was passed into the biliary tree. The       Hydratome sphincterotome was passed over the guidewire and the bile duct       was then deeply cannulated. Contrast was injected. I personally       interpreted the bile duct images. Ductal flow of contrast  was adequate.       Image quality was adequate. Contrast extended to the hepatic ducts.       Opacification of the entire biliary tree except for the cystic duct and       gallbladder was successful. The lower third of the main bile duct and       middle third of the main bile duct contained filling defects thought to       be sludge. The lower third of the main bile duct contained a single       severe stenosis 18 mm in length. The middle third of the main bile duct       and upper third of the main bile duct  were moderately dilated. The       largest diameter was 12 mm. To discover objects, the biliary tree was       swept with a Hydratome sphincterotome. Sludge was swept from the duct.       One 10 mm by 4 cm [employer] uncovered metal biliary stent was       placed into the common bile duct. The stent was in good position. There       was significant stenosis within the stent. Decision made to optimize       drainage. Dilation of the stent in the region of stenosis was performed       with a HurriCaine 6 mm balloon dilator was successful. Drainage was       improved after this had been completed.      A pancreatogram was not performed.      The duodenoscope was withdrawn from the patient. Impression:               - Prior biliary sphincterotomy appeared open.                           - One partially occluded stent from the biliary                            tree was seen in the major papilla. This was                            removed.                           - The fluoroscopic examination was suspicious for                            sludge.                           - A single severe biliary stricture was found in                            the lower third of the main bile duct. The                            stricture was malignant appearing. This led to  biliary dilation proximally.                           - The biliary tree was swept and sludge was found.                           - One uncovered metal biliary stent was placed into                            the common bile duct and due to significant                            stenosis, was dilated to enhance drainage. Moderate Sedation:      Not Applicable - Patient had care per Anesthesia. Recommendation:           - The patient will be observed post-procedure,                            until all discharge criteria are met.                           - Discharge patient to home.                            - Patient has a contact number available for                            emergencies. The signs and symptoms of potential                            delayed complications were discussed with the                            patient. Return to normal activities tomorrow.                            Written discharge instructions were provided to the                            patient.                           - Low fat diet.                           - Watch for pancreatitis, bleeding, perforation,                            and cholangitis.                           - Observe patient's clinical course.                           - Check liver enzymes (AST, ALT, alkaline  phosphatase, bilirubin) in 2 weeks.                           - The findings and recommendations were discussed                            with the patient.                           - The findings and recommendations were discussed                            with the patient's family. Procedure Code(s):        --- Professional ---                           [phone], Endoscopic retrograde                            cholangiopancreatography (ERCP); with removal and                            exchange of stent(s), biliary or pancreatic duct,                            including pre- and post-dilation and guide wire                            passage, when performed, including sphincterotomy,                            when performed, each stent exchanged                           [id number], Endoscopic retrograde                            cholangiopancreatography (ERCP); with removal of                            calculi/debris from biliary/pancreatic duct(s)                           74328, 26, Endoscopic catheterization of the                            biliary ductal system, radiological supervision and                            interpretation Diagnosis Code(s):        --- Professional ---                            Q46.962X, Other mechanical complication of bile                            duct prosthesis, initial encounter  K83.1, Obstruction of bile duct                           Z46.59, Encounter for fitting and adjustment of                            other gastrointestinal appliance and device CPT copyright 2022 [employer]. All rights reserved. The codes documented in this report are preliminary and upon coder review may  be revised to meet current compliance requirements. Corliss Parish, MD 08/08/2023 3:41:27 PM Number of Addenda: 0

## 2023-08-08 NOTE — Progress Notes (Signed)
Patient evaluated in the postprocedural recovery area. She was waking up her daughter was at bedside. No significant issues at that point and we were planning on discharging her home after EUS fiducial placement and ERCP biliary stent exchange to metal stent uncovered self-expanding metal stent. Patient has abdominal pain postprocedure now rated 3-4 out of 10. I have very minimal concerns about potential of perforation, I suspect this is either stent expansion that is causing her issues in the setting of how significant her biliary narrowing/stricture was from her pancreas mass versus the potential of underlying pancreatitis though we did not end up actually going into the pancreas duct we did put fiducials in the pancreas so she is at risk of pancreatitis. Will give her a few doses of IV fentanyl, hopefully should be able to be discharged if not she will come into the hospital for observation. More to come.  Corliss Parish, MD [facility] Office # [phone]

## 2023-08-10 ENCOUNTER — Telehealth: Payer: Self-pay | Admitting: Hematology

## 2023-08-10 NOTE — Telephone Encounter (Signed)
I called pt and reviewed her CT scan findings which showed no evidence of metastatic disease, she has good response to chemo, and will be referred to rad/onc Dr. Mitzi Hansen for SBRT. 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  We discussed the role of anticoagulation is unclear for portal vein thrombosis, will hold anticoagulation and monitor for now.  She understands she is at high risk for thrombosis in other places, due to her underlying malignancy.  She has COVID exposure yesterday, she is asymptomatic, and will do COVID test at her nursing facility in next few days, before her next appointment with [place] on 9/4.  Marie Ford  08/10/2023

## 2023-08-12 ENCOUNTER — Encounter (HOSPITAL_COMMUNITY): Payer: Self-pay | Admitting: Gastroenterology

## 2023-08-12 NOTE — Progress Notes (Addendum)
Patient Care Team: [name], MD as PCP - General (Family Medicine) Malachy Mood, MD as Consulting Physician (Oncology)   CHIEF COMPLAINT: Follow up pancreatic cancer   Oncology History Overview Note   Cancer Staging  Pancreatic cancer [facility]) Staging form: Exocrine Pancreas, AJCC 8th Edition - Clinical: Stage IB (cT2, cN0, cM0) - Signed by Heilingoetter, Cassandra L, PA-C on 04/12/2023 Total positive nodes: 0     Pancreatic cancer (HCC)  03/27/2023 Imaging   CT ABDOMEN PELVIS W CONTRAST   IMPRESSION: Low-attenuation mass centered along the pancreatic head with involvement of the distal common duct with the associated biliary duct ductal dilatation and pancreatic atrophy. Changes are worrisome for neoplasm either a pancreatic neoplasm or cholangiocarcinoma. Recommend further evaluation.   No liver metastases. No pathologically enlarged upper abdominal nodes.   Diffuse colonic stool.  Normal appendix.  Few diverticula.   Portions of the pelvis are obscured by the streak artifact from the bilateral hip arthroplasties.   03/29/2023 Imaging   DG ERCP   IMPRESSION: Intraoperative fluoroscopic images show dilated intra or extrahepatic bile ducts with high-grade stenosis at the level of the distal CBD. Biliary tree is decompressed following placement of common bile duct stent.   04/08/2023 Pathology Results   CYTOLOGY - NON PAP  CASE: [id number]  PATIENT: Marie Ford  Non-Gynecological Cytology Report   FINAL MICROSCOPIC DIAGNOSIS:  A. PANCREAS, HEAD, FINE NEEDLE ASPIRATION:  - Malignant cells present (see Comment)      04/08/2023 Procedure   EUS by Dr. Meridee Score   EUS impression  - A mass was identified in the pancreatic head/ genu of the pancreas. Cytology results are pending. However, the endosonographic appearance is highly suspicious for adenocarcinoma. This was staged T2 N0 Mx by endosonographic criteria. The staging applies if malignancy is confirmed.  Fine needle biopsy performed.  - There was dilation in the common bile duct and in the common hepatic duct which measured up to 8 mm.  - One stent was visualized endosonographically within the common bile duct.  - Hyperechoic material consistent with sludge was visualized endosonographically in the gallbladder.  - Two enlarged lymph nodes were visualized in the celiac region ( level 20) and porta hepatis region. Tissue has not been obtained. However, the endosonographic appearance is suggestive of benign inflammatory change based on the visualization endosonographically.   04/12/2023 Initial Diagnosis   Pancreatic cancer (HCC)   04/12/2023 Cancer Staging   Staging form: Exocrine Pancreas, AJCC 8th Edition - Clinical: Stage IB (cT2, cN0, cM0) - Signed by Heilingoetter, Cassandra L, PA-C on 04/12/2023 Total positive nodes: 0   04/12/2023 Tumor Marker   Patient's tumor was tested for the following markers: CA 19.9. Results of the tumor marker test revealed 512.   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  Report date is 05/03/2023.   The CancerNext-Expanded gene panel offered by [facility] and includes sequencing, rearrangement, and RNA analysis for the following 71 genes:  AIP, ALK, APC, ATM, BAP1, BARD1, BMPR1A, BRCA1, BRCA2, BRIP1, CDC73, CDH1, CDK4, CDKN1B, CDKN2A, CHEK2, DICER1, FH, FLCN, KIF1B, LZTR1,MAX, MEN1, MET, MLH1, MSH2, MSH6, MUTYH, NF1, NF2, NTHL1, PALB2, PHOX2B, PMS2, POT1, PRKAR1A, PTCH1, PTEN, RAD51C,RAD51D, RB1, RET, SDHA, SDHAF2, SDHB, SDHC, SDHD, SMAD4, SMARCA4, SMARCB1, SMARCE1, STK11, SUFU, TMEM127, TP53,TSC1, TSC2 and VHL (sequencing and deletion/duplication); AXIN2, CTNNA1, EGFR, EGLN1, HOXB13, KIT, MITF, MSH3, PDGFRA, POLD1 and POLE (sequencing only); EPCAM and GREM1 (deletion/duplication only).     05/27/2023 -  Chemotherapy  Patient is on Treatment Plan : PANCREATIC Abraxane D1,8,15 + Gemcitabine D1,8,15 q28d        CURRENT  THERAPY: Pancreatic Abraxane + Gemcitabine days 1, 15 q28 days   INTERVAL HISTORY Marie Ford returns for follow up as scheduled. Last seen by Dr. [name] 07/29/23 with another cycle of gem/abraxane. CT 8/26 showed decreased size of pancreatic mass and portal vein thrombus. She had stent exchange and fiducial marker placement on 8/29.  She continues to have persistent nausea without vomiting, partially managed with Compazine.  She reacts to Zofran and Marinol.  She remains able to eat and drink.  She has chronic fatigue that is progressed, now severe.  Able to be out of bed but not able to do much else.  She does not have much quality of life.  Has been checking BP which spikes occasionally, mainly if anxious or has had a busy evening, comes down quickly.  When she had some blurry vision.  Readings are 188/105, 187/92, and twice SBP was over 200.  She notes cramp in her left calf which has been going on for some weeks  ROS  All other systems reviewed and negative  Past Medical History:  Diagnosis Date   Chronic kidney disease    stage 3    Chronic sphenoidal sinusitis    Degenerative arthritis of hip    requirde surgery 2009   Depression 2010   Diplopia 2011   in left lateral gaze    Diplopia    " my eyes dont track together, i have corrective prisms in my eye glasses"    Diverticulosis 2006   noted in a colonoscopy   Esophageal reflux 2013   Hypertension    IGT (impaired glucose tolerance) 2011   denies hx of diabetes    Insomnia 2009   Medical history non-contributory    Mixed hyperlipidemia 2000   Osteoarthritis    Osteopenia 2005   Other and unspecified hyperlipidemia 2000   Other specified disease of sebaceous glands    Palpitations 2009   secondary to PSVT   Parkinson disease    PNA (pneumonia) 2014   Psoriasis 2004   PSVT (paroxysmal supraventricular tachycardia) 2009   "report it has been some time since i had that "    Situational stress    Urinary tract infection    thru  wed 05/15/23, completed 3d antibiotics   Venous insufficiency since 2017     Past Surgical History:  Procedure Laterality Date   BILIARY BRUSHING  03/29/2023   Procedure: BILIARY BRUSHING;  Surgeon: Hilarie Fredrickson, MD;  Location: [facility] ENDOSCOPY;  Service: Gastroenterology;;   BILIARY DILATION  08/08/2023   Procedure: BILIARY DILATION;  Surgeon: Lemar Lofty., MD;  Location: [facility] ENDOSCOPY;  Service: Gastroenterology;;   BILIARY STENT PLACEMENT  03/29/2023   Procedure: BILIARY STENT PLACEMENT;  Surgeon: Hilarie Fredrickson, MD;  Location: [facility] ENDOSCOPY;  Service: Gastroenterology;;   BILIARY STENT PLACEMENT N/A 08/08/2023   Procedure: BILIARY STENT PLACEMENT;  Surgeon: Lemar Lofty., MD;  Location: [facility] ENDOSCOPY;  Service: Gastroenterology;  Laterality: N/A;   BIOPSY  04/08/2023   Procedure: BIOPSY;  Surgeon: Meridee Score Netty Starring., MD;  Location: [facility] ENDOSCOPY;  Service: Gastroenterology;;   CATARACT EXTRACTION W/ INTRAOCULAR LENS  IMPLANT, BILATERAL     COLONOSCOPY  01/30/2010   Jarold Motto   ENDOSCOPIC RETROGRADE CHOLANGIOPANCREATOGRAPHY (ERCP) WITH PROPOFOL N/A 08/08/2023   Procedure: ENDOSCOPIC RETROGRADE CHOLANGIOPANCREATOGRAPHY (ERCP) WITH PROPOFOL;  Surgeon: Lemar Lofty., MD;  Location: [facility] ENDOSCOPY;  Service: Gastroenterology;  Laterality: N/A;   ERCP N/A 03/29/2023   Procedure: ENDOSCOPIC RETROGRADE CHOLANGIOPANCREATOGRAPHY (ERCP);  Surgeon: Hilarie Fredrickson, MD;  Location: [facility] ENDOSCOPY;  Service: Gastroenterology;  Laterality: N/A;   ESOPHAGOGASTRODUODENOSCOPY N/A 08/08/2023   Procedure: ESOPHAGOGASTRODUODENOSCOPY (EGD);  Surgeon: Lemar Lofty., MD;  Location: [facility] ENDOSCOPY;  Service: Gastroenterology;  Laterality: N/A;   ESOPHAGOGASTRODUODENOSCOPY (EGD) WITH PROPOFOL N/A 04/08/2023   Procedure: ESOPHAGOGASTRODUODENOSCOPY (EGD) WITH PROPOFOL;  Surgeon: Meridee Score Netty Starring., MD;  Location: [facility] ENDOSCOPY;  Service: Gastroenterology;  Laterality: N/A;   ETHMOIDECTOMY  Left 11/17/2020   Procedure: LEFT SIDED TOTAL ETHMOIDECTOMY;  Surgeon: Drema Halon, MD;  Location: [facility];  Service: ENT;  Laterality: Left;   EUS N/A 04/08/2023   Procedure: UPPER ENDOSCOPIC ULTRASOUND (EUS) LINEAR;  Surgeon: Lemar Lofty., MD;  Location: [facility] ENDOSCOPY;  Service: Gastroenterology;  Laterality: N/A;   EUS N/A 08/08/2023   Procedure: UPPER ENDOSCOPIC ULTRASOUND (EUS) LINEAR;  Surgeon: Lemar Lofty., MD;  Location: [facility] ENDOSCOPY;  Service: Gastroenterology;  Laterality: N/A;   EYE SURGERY     FIDUCIAL MARKER PLACEMENT  08/08/2023   Procedure: FIDUCIAL MARKER PLACEMENT;  Surgeon: Lemar Lofty., MD;  Location: [facility] ENDOSCOPY;  Service: Gastroenterology;;   FINE NEEDLE ASPIRATION N/A 04/08/2023   Procedure: FINE NEEDLE ASPIRATION (FNA) LINEAR;  Surgeon: Lemar Lofty., MD;  Location: [facility] ENDOSCOPY;  Service: Gastroenterology;  Laterality: N/A;   HEMORRHOID SURGERY     IR IMAGING GUIDED PORT INSERTION  05/17/2023   JOINT REPLACEMENT     LASIK     left medial and superior rectus muscle recesion for diplopia correction     REMOVAL OF STONES  08/08/2023   Procedure: REMOVAL OF SLUDGE;  Surgeon: Meridee Score Netty Starring., MD;  Location: [facility] ENDOSCOPY;  Service: Gastroenterology;;   SINUS ENDO WITH FUSION Left 11/17/2020   Procedure: SINUS ENDOSCOPY WITH FUSION NAVIGATION;  Surgeon: Drema Halon, MD;  Location: [facility];  Service: ENT;  Laterality: Left;   SPHENOIDECTOMY Left 11/17/2020   Procedure: LEFT SIDED SPHENOIDECTOMY WITH TISSUE REMOVAL;  Surgeon: Drema Halon, MD;  Location: [facility];  Service: ENT;  Laterality: Left;   SPHINCTEROTOMY  03/29/2023   Procedure: SPHINCTEROTOMY;  Surgeon: Hilarie Fredrickson, MD;  Location: [facility] ENDOSCOPY;  Service: Gastroenterology;;   Francine Graven REMOVAL  08/08/2023   Procedure: STENT REMOVAL;  Surgeon: Lemar Lofty., MD;  Location: [facility] ENDOSCOPY;   Service: Gastroenterology;;   surgery of right eye rectus muscles for diplpia correction   2019   TOTAL HIP ARTHROPLASTY Right 2010   TOTAL HIP ARTHROPLASTY Left 08/19/2019   Procedure: TOTAL HIP ARTHROPLASTY ANTERIOR APPROACH;  Surgeon: Ollen Gross, MD;  Location: [facility]S;  Service: Orthopedics;  Laterality: Left;    TURBINATE REDUCTION Left 11/17/2020   Procedure: LEFT SIDED TURBINATE REDUCTION;  Surgeon: Drema Halon, MD;  Location: [facility];  Service: ENT;  Laterality: Left;   URETHRAL SLING     midurethral sling with TVT Exact and Cystoscopy     Outpatient Encounter Medications as of 08/14/2023  Medication Sig Note   ALPRAZolam (XANAX) 0.5 MG tablet Take 0.25 mg by mouth as needed for anxiety. Takes as needed for sleep    AUVELITY 45-105 MG TBCR Take 1 tablet by mouth every evening. 03/28/2023: Per pt she only takes this once in the evening   buPROPion (WELLBUTRIN XL) 150 MG 24 hr tablet Take 150 mg by mouth daily.    Calcium  Carbonate Antacid (CALCIUM CARBONATE PO) Take by mouth.    carbidopa-levodopa (SINEMET IR) 25-100 MG tablet Take 1.5 tablets by mouth every 4 (four) hours as needed. -Try to separate Sinemet from food (especially protein-rich foods like meat, dairy, eggs) by about 30-60 mins - this will help the absorption of the medication. If you have some nausea with the medication, you can take it with some light food like crackers or ginger ale    donepezil (ARICEPT) 10 MG tablet Take 1 tablet (10 mg total) by mouth at bedtime.    dronabinol (MARINOL) 2.5 MG capsule Take 1 capsule (2.5 mg total) by mouth 2 (two) times daily before a meal. Ok to refill on 08/06/2023    lidocaine-prilocaine (EMLA) cream Apply to affected area once 05/17/2023: Not started   losartan (COZAAR) 50 MG tablet Take 1 tablet (50 mg total) by mouth daily.    magic mouthwash (multi-ingredient) oral suspension Take 5 mLs by mouth 4 (four) times daily.    Multiple Vitamin  (MULTIVITAMIN WITH MINERALS) TABS tablet Take 1 tablet by mouth daily. Women's Multivitamin    OVER THE COUNTER MEDICATION Take 1 tablet by mouth daily as needed (constipation).    pantoprazole (PROTONIX) 40 MG tablet Take 40 mg by mouth daily.    Polyethyl Glycol-Propyl Glycol (SYSTANE) 0.4-0.3 % SOLN Place 1 drop into both eyes 3 (three) times daily as needed (dry/irritated eyes.).    polyethylene glycol (MIRALAX / GLYCOLAX) 17 g packet Take 17 g by mouth daily.    pravastatin (PRAVACHOL) 80 MG tablet Take 1 tablet (80 mg total) by mouth every evening.    prochlorperazine (COMPAZINE) 10 MG tablet Take 1 tablet (10 mg total) by mouth every 6 (six) hours as needed for nausea or vomiting. 05/17/2023: Not started   promethazine (PHENERGAN) 25 MG tablet Take 25 mg by mouth every 6 (six) hours as needed for nausea or vomiting. 05/17/2023: Not started   triamcinolone (NASACORT) 55 MCG/ACT AERO nasal inhaler Place 2 sprays into the nose daily. Use at night (Patient taking differently: Place 1 spray into the nose daily. Use at night)    [DISCONTINUED] sodium chloride flush (NS) 0.9 % injection 10 mL     No facility-administered encounter medications on file as of 08/14/2023.     Today's Vitals   08/14/23 0911  BP: 135/77  Pulse: 69  Resp: 16  Temp: 97.6 F (36.4 C)  TempSrc: Oral  SpO2: 99%  Weight: 143 lb 12.8 oz (65.2 kg)  PainSc: 0-No pain   Body mass index is 23.21 kg/m.   PHYSICAL EXAM GENERAL:alert, no distress and comfortable SKIN: no rash  EYES: sclera clear NECK: without mass LYMPH:  no palpable cervical or supraclavicular lymphadenopathy  LUNGS: clear with normal breathing effort HEART: regular rate & rhythm, no lower extremity edema or left LE erythema ABDOMEN: abdomen soft, non-tender and normal bowel sounds NEURO: alert & oriented x 3 with fluent speech PAC without erythema    CBC    Component Value Date/Time   WBC 5.3 08/14/2023 0837   WBC 6.6 03/30/2023 0332   RBC 3.82  (L) 08/14/2023 0837   HGB 12.5 08/14/2023 0837   HGB 12.7 05/30/2023 1316   HCT 36.7 08/14/2023 0837   HCT 36.8 05/30/2023 1316   PLT 286 08/14/2023 0837   PLT 228 05/30/2023 1316   MCV 96.1 08/14/2023 0837   MCV 95 05/30/2023 1316   MCH 32.7 08/14/2023 0837   MCHC 34.1 08/14/2023 0837   RDW 13.7  08/14/2023 0837   RDW 11.7 05/30/2023 1316   LYMPHSABS 1.4 08/14/2023 0837   LYMPHSABS 0.9 05/30/2023 1316   MONOABS 1.0 08/14/2023 0837   EOSABS 0.1 08/14/2023 0837   EOSABS 0.1 05/30/2023 1316   BASOSABS 0.0 08/14/2023 0837   BASOSABS 0.0 05/30/2023 1316     CMP     Component Value Date/Time   NA 141 08/14/2023 0837   NA 140 05/30/2023 1316   K 3.4 (L) 08/14/2023 0837   CL 105 08/14/2023 0837   CO2 28 08/14/2023 0837   GLUCOSE 119 (H) 08/14/2023 0837   BUN 18 08/14/2023 0837   BUN 29 (H) 05/30/2023 1316   CREATININE 1.10 (H) 08/14/2023 0837   CALCIUM 9.3 08/14/2023 0837   PROT 6.6 08/14/2023 0837   PROT 6.4 05/30/2023 1316   ALBUMIN 3.7 08/14/2023 0837   ALBUMIN 3.9 05/30/2023 1316   AST 15 08/14/2023 0837   ALT <5 08/14/2023 0837   ALKPHOS 88 08/14/2023 0837   BILITOT 1.0 08/14/2023 0837   GFRNONAA 51 (L) 08/14/2023 0837   GFRAA 58 (L) 08/20/2019 0252     ASSESSMENT & PLAN:Lavita C Neeb is a 77 y.o. female with    Pancreatic cancer, stage IB (T2, N0, Mx) -Diagnosed 03/2023 when she presented to ED 4/17 with jaundice; Tbili 8.6 -CT scan showed pancreatic head lesion with involvement of the distal CBD and associated biliary ductal dilation.  No evidence of lymphadenopathy or metastatic disease to the liver -S/p ERCP and stenting on 4/19 -S/p EUS by Dr. Meridee Score on 04/08/23 showing 30 x 23 mm pancreatic head lesion without any vascular involvement. Final pathology showed pancreatic adenocarcinoma -She has met pancreatobiliary surgeon Dr. Donell Beers, and she declined the surgery.  Patient understands that her cancer is not curable without surgery. -She started first line  chemo with gem/abraxane q2 weeks on 05/27/23, CA 19-9 has dropped significantly on chemo -Restaging CT 8/26 showed partial response to the primary site, no disease progression, and incidental portal vein thrombus. Dr. [name] called pt to review and discussed the role of anticoagulation and opted to hold for now -S/p stent exchange and fiducial marker placement 8/29 -Ms Sitts appears frail but stable.  S/p cycle 3-day 1 gemcitabine/Abraxane (every 2 weeks), tolerating fairly well with moderate nausea and progressive fatigue.  SEs are partially managed with supportive care at home.  Due to her Parkinson's she prefers to hold on adding additional antiemetics at this time.  -Mr. Polka is not able to recover very well with much meaningful quality of life between chemo cycles.  She prefers to proceed with dose reduced chemo today then move to consolidation radiation. I will refer her back to Dr. Mitzi Hansen -Will cancel chemo in 2 weeks, which is her birthday. F/up in 3 weeks for close monitoring -Family on the phone supports her goals. -Pt seen with Dr. [name]  Left calf cramps -She reports intermittent left calf cramp for past few weeks.  -no swelling or erythema on exam -Due to her portal vein thrombus, sedentary state, and underlying malignancy will proceed with doppler to r/o lower extremity DVT   Parkinson disease -On carbidopa-levodopa, follow-up with neurology   Elevated BP readings -At home, on anti-hypertensives already -Will defer to PCP, pt has phone call with them today    PLAN: -Labs and restaging CT reviewed -Proceed with dose-reduced Gem/abraxane today as planned -Cancel future chemo, proceed with consolidation radiation - cc'd note to [place] PA and Dr. Mitzi Hansen -Doppler today, will call with results -F/up  in 3 weeks for close follow up  -Pt seen with Dr. [name]   Orders Placed This Encounter  Procedures   US Venous Img Lower Bilateral (DVT)    Standing Status:   Future    Standing  Expiration Date:   08/13/2024    Order Specific Question:   Reason for Exam (SYMPTOM  OR DIAGNOSIS REQUIRED)    Answer:   pancreatic cancer, portal vein thrombosis, left calf cramp r/o DVT    Order Specific Question:   Preferred imaging location?    Answer:   [facility]   Ambulatory referral to Radiation Oncology    Referral Priority:   Routine    Referral Type:   Consultation    Referral Reason:   Specialty Services Required    Number of Visits Requested:   1      All questions were answered. The patient knows to call the clinic with any problems, questions or concerns. No barriers to learning were detected.   Santiago Glad, NP-C 08/14/2023  Addendum I have seen the patient, examined her. I agree with the assessment and and plan and have edited the notes.   I reviewed her restaging CT, which showed good partial response.  No evidence of metastatic disease.  She has had fiducial placement, and will proceed with radiation soon.  She has been quite fatigued since her recent chemotherapy, will proceed treatment today with dose reduction.  She has now had so far a total of 3 months chemo, will stop here and proceed with his radiation.  All questions were answered.  I plan to continue disease monitoring after radiation, and restarted chemotherapy only when she has disease progression and she is a candidate for chemo and willing to take. All questions were answered.  Malachy Mood MD 08/14/2023

## 2023-08-13 ENCOUNTER — Encounter: Payer: Self-pay | Admitting: Hematology

## 2023-08-13 ENCOUNTER — Encounter: Payer: Self-pay | Admitting: Neurology

## 2023-08-14 ENCOUNTER — Ambulatory Visit (HOSPITAL_BASED_OUTPATIENT_CLINIC_OR_DEPARTMENT_OTHER)
Admission: RE | Admit: 2023-08-14 | Discharge: 2023-08-14 | Disposition: A | Discharge status: Home / Self Care | Payer: Medicare PPO | Source: Ambulatory Visit | Attending: Nurse Practitioner | Admitting: Nurse Practitioner

## 2023-08-14 ENCOUNTER — Encounter: Payer: Self-pay | Admitting: Nurse Practitioner

## 2023-08-14 ENCOUNTER — Inpatient Hospital Stay: Payer: Medicare PPO | Attending: Physician Assistant

## 2023-08-14 ENCOUNTER — Inpatient Hospital Stay: Payer: Medicare PPO

## 2023-08-14 ENCOUNTER — Inpatient Hospital Stay: Payer: Medicare PPO | Admitting: Dietician

## 2023-08-14 ENCOUNTER — Other Ambulatory Visit: Payer: Self-pay

## 2023-08-14 ENCOUNTER — Other Ambulatory Visit: Payer: Self-pay | Admitting: Hematology

## 2023-08-14 ENCOUNTER — Inpatient Hospital Stay: Payer: Medicare PPO | Attending: Physician Assistant | Admitting: Nurse Practitioner

## 2023-08-14 DIAGNOSIS — Z5111 Encounter for antineoplastic chemotherapy: Secondary | ICD-10-CM | POA: Diagnosis present

## 2023-08-14 DIAGNOSIS — C250 Malignant neoplasm of head of pancreas: Secondary | ICD-10-CM

## 2023-08-14 DIAGNOSIS — Z79899 Other long term (current) drug therapy: Secondary | ICD-10-CM | POA: Diagnosis not present

## 2023-08-14 DIAGNOSIS — Z95828 Presence of other vascular implants and grafts: Secondary | ICD-10-CM

## 2023-08-14 LAB — CMP (CANCER CENTER ONLY)
ALT: <5 U/L (ref 0–44)
AST: 15 U/L (ref 15–41)
Albumin: 3.7 g/dL (ref 3.5–5.0)
Alkaline Phosphatase: 88 U/L (ref 38–126)
Anion gap: 8 (ref 5–15)
BUN: 18 mg/dL (ref 8–23)
CO2: 28 mmol/L (ref 22–32)
Calcium: 9.3 mg/dL (ref 8.9–10.3)
Chloride: 105 mmol/L (ref 98–111)
Creatinine: 1.1 mg/dL — ABNORMAL HIGH (ref 0.44–1.00)
GFR, Estimated: 51 mL/min — ABNORMAL LOW (ref >60)
Glucose, Bld: 119 mg/dL — ABNORMAL HIGH (ref 70–99)
Potassium: 3.4 mmol/L — ABNORMAL LOW (ref 3.5–5.1)
Sodium: 141 mmol/L (ref 135–145)
Total Bilirubin: 1 mg/dL (ref 0.3–1.2)
Total Protein: 6.6 g/dL (ref 6.5–8.1)

## 2023-08-14 LAB — CBC WITH DIFFERENTIAL (CANCER CENTER ONLY)
Abs Immature Granulocytes: 0.02 10*3/uL (ref 0.00–0.07)
Basophils Absolute: 0 10*3/uL (ref 0.0–0.1)
Basophils Relative: 1 %
Eosinophils Absolute: 0.1 10*3/uL (ref 0.0–0.5)
Eosinophils Relative: 3 %
HCT: 36.7 % (ref 36.0–46.0)
Hemoglobin: 12.5 g/dL (ref 12.0–15.0)
Immature Granulocytes: 0 %
Lymphocytes Relative: 26 %
Lymphs Abs: 1.4 10*3/uL (ref 0.7–4.0)
MCH: 32.7 pg (ref 26.0–34.0)
MCHC: 34.1 g/dL (ref 30.0–36.0)
MCV: 96.1 fL (ref 80.0–100.0)
Monocytes Absolute: 1 10*3/uL (ref 0.1–1.0)
Monocytes Relative: 18 %
Neutro Abs: 2.8 10*3/uL (ref 1.7–7.7)
Neutrophils Relative %: 52 %
Platelet Count: 286 10*3/uL (ref 150–400)
RBC: 3.82 MIL/uL — ABNORMAL LOW (ref 3.87–5.11)
RDW: 13.7 % (ref 11.5–15.5)
WBC Count: 5.3 10*3/uL (ref 4.0–10.5)
nRBC: 0 % (ref 0.0–0.2)

## 2023-08-14 MED ORDER — SODIUM CHLORIDE 0.9% FLUSH
10.0000 mL | INTRAVENOUS | Status: DC | PRN
  Administered 2023-08-14: 10 mL

## 2023-08-14 MED ORDER — PROCHLORPERAZINE MALEATE 10 MG PO TABS
10.0000 mg | ORAL_TABLET | Freq: Once | ORAL | Status: AC
  Administered 2023-08-14: 10 mg via ORAL
  Filled 2023-08-14: qty 1

## 2023-08-14 MED ORDER — PACLITAXEL PROTEIN-BOUND CHEMO INJECTION 100 MG
80.0000 mg/m2 | Freq: Once | INTRAVENOUS | Status: AC
  Administered 2023-08-14: 150 mg via INTRAVENOUS
  Filled 2023-08-14: qty 30

## 2023-08-14 MED ORDER — SODIUM CHLORIDE 0.9 % IV SOLN: Freq: Once | INTRAVENOUS | Status: AC

## 2023-08-14 MED ORDER — SODIUM CHLORIDE 0.9 % IV SOLN
600.0000 mg/m2 | Freq: Once | INTRAVENOUS | Status: AC
  Administered 2023-08-14: 1026 mg via INTRAVENOUS
  Filled 2023-08-14: qty 26.98

## 2023-08-14 MED ORDER — HEPARIN SOD (PORK) LOCK FLUSH 100 UNIT/ML IV SOLN
500.0000 [IU] | Freq: Once | INTRAVENOUS | Status: AC | PRN
  Administered 2023-08-14: 500 [IU]

## 2023-08-14 NOTE — Progress Notes (Signed)
Nutrition Follow-up:  Patient with pancreatic cancer followed by Dr [name]. Receiving gemcitabine and abraxane.   Met with pt and friend in infusion. Pt reports poor appetite and nausea persists. She is taking compazine as prescribed. Pt does not tolerate zofran or marinol. Foods do not taste good. Recalls tolerating applesauce, yogurt, soups (beef barley, tomato), ice cream, mandarin oranges, crackers. Pt is drinking 1-2 Ensure Plus. She is requesting coupons today.    Medications: reviewed   Labs: K 3.4, glucose 119, Cr 1.10  Anthropometrics: Wt 143 lb 12.8 oz today   8/12 - 143 lb 12.8 oz  7/15 - 148 lb 4.8 oz  6/17 - 147 lb 14.4 oz    NUTRITION DIAGNOSIS: Inadequate oral intake continues    INTERVENTION:  Educated on strategies for altered taste, suggested baking soda salt water rinses several times daily and before meals - handout with tips provided  Encouraged small frequent meals/snacks q2h - suggested setting "snack time" alarms Discussed foods with protein, educated on including protein at every meal (cottage cheese w/ applesauce, adding peanut butter or sliced cheese with crackers) Suggested drinking half Ensure QID (2/day) to reduce prolonged fullness felt after drinking Pt agreeable to bedtime snack, ideas offered  Reviewed strategies for nausea, suggested waiting 30 minutes before eating after compazine    MONITORING, EVALUATION, GOAL: wt trends, intake   NEXT VISIT: Tuesday October 1 during infusion

## 2023-08-14 NOTE — Patient Instructions (Signed)
[facility]  Discharge Instructions: Thank you for choosing [facility] to provide your oncology and hematology care.   If you have a lab appointment with the Cancer Center, please go directly to the Cancer Center and check in at the registration area.   Wear comfortable clothing and clothing appropriate for easy access to any Portacath or PICC line.   We strive to give you quality time with your provider. You may need to reschedule your appointment if you arrive late (15 or more minutes).  Arriving late affects you and other patients whose appointments are after yours.  Also, if you miss three or more appointments without notifying the office, you may be dismissed from the clinic at the provider's discretion.      For prescription refill requests, have your pharmacy contact our office and allow 72 hours for refills to be completed.    Today you received the following chemotherapy and/or immunotherapy agents Abraxane, Gemzar      To help prevent nausea and vomiting after your treatment, we encourage you to take your nausea medication as directed.  BELOW ARE SYMPTOMS THAT SHOULD BE REPORTED IMMEDIATELY: *FEVER GREATER THAN 100.4 F (38 C) OR HIGHER *CHILLS OR SWEATING *NAUSEA AND VOMITING THAT IS NOT CONTROLLED WITH YOUR NAUSEA MEDICATION *UNUSUAL SHORTNESS OF BREATH *UNUSUAL BRUISING OR BLEEDING *URINARY PROBLEMS (pain or burning when urinating, or frequent urination) *BOWEL PROBLEMS (unusual diarrhea, constipation, pain near the anus) TENDERNESS IN MOUTH AND THROAT WITH OR WITHOUT PRESENCE OF ULCERS (sore throat, sores in mouth, or a toothache) UNUSUAL RASH, SWELLING OR PAIN  UNUSUAL VAGINAL DISCHARGE OR ITCHING   Items with * indicate a potential emergency and should be followed up as soon as possible or go to the Emergency Department if any problems should occur.  Please show the CHEMOTHERAPY ALERT CARD or IMMUNOTHERAPY ALERT CARD at  check-in to the Emergency Department and triage nurse.  Should you have questions after your visit or need to cancel or reschedule your appointment, please contact [facility]  Dept: [phone]  and follow the prompts.  Office hours are 8:00 a.m. to 4:30 p.m. Monday - Friday. Please note that voicemails left after 4:00 p.m. may not be returned until the following business day.  We are closed weekends and major holidays. You have access to a nurse at all times for urgent questions. Please call the main number to the clinic Dept: [phone] and follow the prompts.   For any non-urgent questions, you may also contact your provider using MyChart. We now offer e-Visits for anyone 18 and older to request care online for non-urgent symptoms. For details visit [email or web address].   Also download the MyChart app! Go to the app store, search "MyChart", open the app, select [facility], and log in with your MyChart username and password.   

## 2023-08-16 ENCOUNTER — Telehealth: Payer: Self-pay | Admitting: Hematology

## 2023-08-18 ENCOUNTER — Encounter: Payer: Self-pay | Admitting: Hematology

## 2023-08-19 ENCOUNTER — Ambulatory Visit: Payer: Medicare PPO | Admitting: Hematology

## 2023-08-19 ENCOUNTER — Other Ambulatory Visit: Payer: Medicare PPO

## 2023-08-19 ENCOUNTER — Ambulatory Visit: Payer: Medicare PPO

## 2023-08-19 NOTE — Progress Notes (Signed)
Radiation Oncology         ([phone]) [phone] ________________________________  Name: Marie Ford        MRN: [id number]  Date of Service: 08/21/2023 DOB: 06-Jan-1946  [id number], Theron Arista, MD  Malachy Mood, MD     REFERRING PHYSICIAN: Malachy Mood, MD   DIAGNOSIS: The encounter diagnosis was Malignant neoplasm of head of pancreas [facility]).   HISTORY OF PRESENT ILLNESS: Marie Ford is a 77 y.o. female seen at the request of Dr. [name] for a diagnosis of pancreatic cancer. The patient was diagnosed in April 2024 and she underwent ERCP showing high-grade stenosis at the level of the common bile duct which in a tree was decompressed with the placement of the stent with fine-needle aspirate from the hide as the pain came off taking showed adenocarcinoma with palliative ER documents yearly discussedhis stage was T2 N0 fever chills as well as consistent with benign appearing enlargement of two celiac lymph nodes. while she is considered a candidate for Whipple Procedure, she has declined this with Dr. Donell Beers. She began Gemzar/Abraxatine on 05/27/23 and she has completed 4 cycles of this but tolerating this poorly. She's seen today to consider stereotactic body radiotherapy (SBRT) rather for more local control. Of note she did have repeat ERCP and stent change on 08/08/23 with Dr. Meridee Score as well as EUS with fiducial marker placement that day. 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 She is seen to discuss SBRT.    PREVIOUS RADIATION THERAPY: {EXAM; YES/NO:19492::"No"}   PAST MEDICAL HISTORY:  Past Medical History:  Diagnosis Date   Chronic kidney disease    stage 3    Chronic sphenoidal sinusitis    Degenerative arthritis of hip    requirde surgery 2009   Depression 2010   Diplopia 2011   in left lateral gaze    Diplopia    " my eyes dont track together, i have corrective prisms in my eye glasses"    Diverticulosis 2006   noted in a colonoscopy    Esophageal reflux 2013   Hypertension    IGT (impaired glucose tolerance) 2011   denies hx of diabetes    Insomnia 2009   Medical history non-contributory    Mixed hyperlipidemia 2000   Osteoarthritis    Osteopenia 2005   Other and unspecified hyperlipidemia 2000   Other specified disease of sebaceous glands    Palpitations 2009   secondary to PSVT   Parkinson disease    PNA (pneumonia) 2014   Psoriasis 2004   PSVT (paroxysmal supraventricular tachycardia) 2009   "report it has been some time since i had that "    Situational stress    Urinary tract infection    thru wed 05/15/23, completed 3d antibiotics   Venous insufficiency since 2017       PAST SURGICAL HISTORY: Past Surgical History:  Procedure Laterality Date   BILIARY BRUSHING  03/29/2023   Procedure: BILIARY BRUSHING;  Surgeon: Hilarie Fredrickson, MD;  Location: [facility] ENDOSCOPY;  Service: Gastroenterology;;   BILIARY DILATION  08/08/2023   Procedure: BILIARY DILATION;  Surgeon: Lemar Lofty., MD;  Location: [facility] ENDOSCOPY;  Service: Gastroenterology;;   BILIARY STENT PLACEMENT  03/29/2023   Procedure: BILIARY STENT PLACEMENT;  Surgeon: Hilarie Fredrickson, MD;  Location: [facility] ENDOSCOPY;  Service: Gastroenterology;;   BILIARY STENT PLACEMENT N/A 08/08/2023   Procedure: BILIARY STENT PLACEMENT;  Surgeon: Lemar Lofty., MD;  Location: [facility] ENDOSCOPY;  Service: Gastroenterology;  Laterality: N/A;   BIOPSY  04/08/2023   Procedure: BIOPSY;  Surgeon: Meridee Score Netty Starring., MD;  Location: [facility] ENDOSCOPY;  Service: Gastroenterology;;   CATARACT EXTRACTION W/ INTRAOCULAR LENS  IMPLANT, BILATERAL     COLONOSCOPY  01/30/2010   Jarold Motto   ENDOSCOPIC RETROGRADE CHOLANGIOPANCREATOGRAPHY (ERCP) WITH PROPOFOL N/A 08/08/2023   Procedure: ENDOSCOPIC RETROGRADE CHOLANGIOPANCREATOGRAPHY (ERCP) WITH PROPOFOL;  Surgeon: Lemar Lofty., MD;  Location: [facility] ENDOSCOPY;  Service: Gastroenterology;  Laterality: N/A;   ERCP N/A 03/29/2023    Procedure: ENDOSCOPIC RETROGRADE CHOLANGIOPANCREATOGRAPHY (ERCP);  Surgeon: Hilarie Fredrickson, MD;  Location: [facility] ENDOSCOPY;  Service: Gastroenterology;  Laterality: N/A;   ESOPHAGOGASTRODUODENOSCOPY N/A 08/08/2023   Procedure: ESOPHAGOGASTRODUODENOSCOPY (EGD);  Surgeon: Lemar Lofty., MD;  Location: [facility] ENDOSCOPY;  Service: Gastroenterology;  Laterality: N/A;   ESOPHAGOGASTRODUODENOSCOPY (EGD) WITH PROPOFOL N/A 04/08/2023   Procedure: ESOPHAGOGASTRODUODENOSCOPY (EGD) WITH PROPOFOL;  Surgeon: Meridee Score Netty Starring., MD;  Location: [facility] ENDOSCOPY;  Service: Gastroenterology;  Laterality: N/A;   ETHMOIDECTOMY Left 11/17/2020   Procedure: LEFT SIDED TOTAL ETHMOIDECTOMY;  Surgeon: Drema Halon, MD;  Location: [facility];  Service: ENT;  Laterality: Left;   EUS N/A 04/08/2023   Procedure: UPPER ENDOSCOPIC ULTRASOUND (EUS) LINEAR;  Surgeon: Lemar Lofty., MD;  Location: [facility] ENDOSCOPY;  Service: Gastroenterology;  Laterality: N/A;   EUS N/A 08/08/2023   Procedure: UPPER ENDOSCOPIC ULTRASOUND (EUS) LINEAR;  Surgeon: Lemar Lofty., MD;  Location: [facility] ENDOSCOPY;  Service: Gastroenterology;  Laterality: N/A;   EYE SURGERY     FIDUCIAL MARKER PLACEMENT  08/08/2023   Procedure: FIDUCIAL MARKER PLACEMENT;  Surgeon: Lemar Lofty., MD;  Location: [facility] ENDOSCOPY;  Service: Gastroenterology;;   FINE NEEDLE ASPIRATION N/A 04/08/2023   Procedure: FINE NEEDLE ASPIRATION (FNA) LINEAR;  Surgeon: Lemar Lofty., MD;  Location: [facility] ENDOSCOPY;  Service: Gastroenterology;  Laterality: N/A;   HEMORRHOID SURGERY     IR IMAGING GUIDED PORT INSERTION  05/17/2023   JOINT REPLACEMENT     LASIK     left medial and superior rectus muscle recesion for diplopia correction     REMOVAL OF STONES  08/08/2023   Procedure: REMOVAL OF SLUDGE;  Surgeon: Meridee Score Netty Starring., MD;  Location: [facility] ENDOSCOPY;  Service: Gastroenterology;;   SINUS ENDO WITH FUSION Left 11/17/2020    Procedure: SINUS ENDOSCOPY WITH FUSION NAVIGATION;  Surgeon: Drema Halon, MD;  Location: [facility];  Service: ENT;  Laterality: Left;   SPHENOIDECTOMY Left 11/17/2020   Procedure: LEFT SIDED SPHENOIDECTOMY WITH TISSUE REMOVAL;  Surgeon: Drema Halon, MD;  Location: [facility];  Service: ENT;  Laterality: Left;   SPHINCTEROTOMY  03/29/2023   Procedure: SPHINCTEROTOMY;  Surgeon: Hilarie Fredrickson, MD;  Location: [facility] ENDOSCOPY;  Service: Gastroenterology;;   Francine Graven REMOVAL  08/08/2023   Procedure: STENT REMOVAL;  Surgeon: Lemar Lofty., MD;  Location: [facility] ENDOSCOPY;  Service: Gastroenterology;;   surgery of right eye rectus muscles for diplpia correction   2019   TOTAL HIP ARTHROPLASTY Right 2010   TOTAL HIP ARTHROPLASTY Left 08/19/2019   Procedure: TOTAL HIP ARTHROPLASTY ANTERIOR APPROACH;  Surgeon: Ollen Gross, MD;  Location: [facility] ORS;  Service: Orthopedics;  Laterality: Left;    TURBINATE REDUCTION Left 11/17/2020   Procedure: LEFT SIDED TURBINATE REDUCTION;  Surgeon: Drema Halon, MD;  Location: [facility];  Service: ENT;  Laterality: Left;   URETHRAL SLING     midurethral sling with TVT Exact and Cystoscopy  FAMILY HISTORY:  Family History  Problem Relation Age of Onset   Congestive Heart Failure Mother    CVA Father    Heart failure Father    Non-Hodgkin's lymphoma Sister    Alcohol abuse Sister    Liver cancer Brother    Colon cancer Brother 20   Colitis Brother    Hypertension Brother    Gout Brother    Melanoma Daughter    Parkinson's disease Cousin    Esophageal cancer Neg Hx    Rectal cancer Neg Hx    Stomach cancer Neg Hx    Colon polyps Neg Hx      SOCIAL HISTORY:  reports that she has never smoked. She has never used smokeless tobacco. She reports current alcohol use. She reports that she does not use drugs.   ALLERGIES: Erythromycin, Macrobid [nitrofurantoin], Keflex [cephalexin],  Levofloxacin, Penicillins, and Sulfamethoxazole-trimethoprim   MEDICATIONS:  Current Outpatient Medications  Medication Sig Dispense Refill   ALPRAZolam (XANAX) 0.5 MG tablet Take 0.25 mg by mouth as needed for anxiety. Takes as needed for sleep     AUVELITY 45-105 MG TBCR Take 1 tablet by mouth every evening.     buPROPion (WELLBUTRIN XL) 150 MG 24 hr tablet Take 150 mg by mouth daily.     Calcium Carbonate Antacid (CALCIUM CARBONATE PO) Take by mouth.     carbidopa-levodopa (SINEMET IR) 25-100 MG tablet Take 1.5 tablets by mouth every 4 (four) hours as needed. -Try to separate Sinemet from food (especially protein-rich foods like meat, dairy, eggs) by about 30-60 mins - this will help the absorption of the medication. If you have some nausea with the medication, you can take it with some light food like crackers or ginger ale 540 tablet 4   donepezil (ARICEPT) 10 MG tablet Take 1 tablet (10 mg total) by mouth at bedtime. 30 tablet 5   dronabinol (MARINOL) 2.5 MG capsule Take 1 capsule (2.5 mg total) by mouth 2 (two) times daily before a meal. Ok to refill on 08/06/2023 60 capsule 0   lidocaine-prilocaine (EMLA) cream Apply to affected area once 30 g 3   losartan (COZAAR) 50 MG tablet Take 1 tablet (50 mg total) by mouth daily. 90 tablet 1   magic mouthwash (multi-ingredient) oral suspension Take 5 mLs by mouth 4 (four) times daily. 240 mL 1   Multiple Vitamin (MULTIVITAMIN WITH MINERALS) TABS tablet Take 1 tablet by mouth daily. Women's Multivitamin     OVER THE COUNTER MEDICATION Take 1 tablet by mouth daily as needed (constipation).     pantoprazole (PROTONIX) 40 MG tablet Take 40 mg by mouth daily.     Polyethyl Glycol-Propyl Glycol (SYSTANE) 0.4-0.3 % SOLN Place 1 drop into both eyes 3 (three) times daily as needed (dry/irritated eyes.).     polyethylene glycol (MIRALAX / GLYCOLAX) 17 g packet Take 17 g by mouth daily.     pravastatin (PRAVACHOL) 80 MG tablet Take 1 tablet (80 mg total) by  mouth every evening.     prochlorperazine (COMPAZINE) 10 MG tablet Take 1 tablet (10 mg total) by mouth every 6 (six) hours as needed for nausea or vomiting. 30 tablet 1   promethazine (PHENERGAN) 25 MG tablet Take 25 mg by mouth every 6 (six) hours as needed for nausea or vomiting.     triamcinolone (NASACORT) 55 MCG/ACT AERO nasal inhaler Place 2 sprays into the nose daily. Use at night (Patient taking differently: Place 1 spray into the nose daily. Use at  night) 1 each 12   No current facility-administered medications for this visit.     REVIEW OF SYSTEMS: On review of systems, the patient reports that *** is doing well overall. *** denies any chest pain, shortness of breath, cough, fevers, chills, night sweats, unintended weight changes. *** denies any bowel or bladder disturbances, and denies abdominal pain, nausea or vomiting. *** denies any new musculoskeletal or joint aches or pains. A complete review of systems is obtained and is otherwise negative.     PHYSICAL EXAM:  Wt Readings from Last 3 Encounters:  08/14/23 143 lb 12.8 oz (65.2 kg)  08/08/23 140 lb (63.5 kg)  07/29/23 145 lb (65.8 kg)   Temp Readings from Last 3 Encounters:  08/14/23 97.6 F (36.4 C) (Oral)  08/08/23 97.6 F (36.4 C) (Temporal)  07/29/23 97.6 F (36.4 C) (Oral)   BP Readings from Last 3 Encounters:  08/14/23 135/77  08/08/23 (!) 168/85  07/29/23 (!) 160/78   Pulse Readings from Last 3 Encounters:  08/14/23 69  08/08/23 71  07/29/23 65    /10  In general this is a well appearing *** in no acute distress. ***'s alert and oriented x4 and appropriate throughout the examination. Cardiopulmonary assessment is negative for acute distress and *** exhibits normal effort.     ECOG = ***  0 - Asymptomatic (Fully active, able to carry on all predisease activities without restriction)  1 - Symptomatic but completely ambulatory (Restricted in physically strenuous activity but ambulatory and able to  carry out work of a light or sedentary nature. For example, light housework, office work)  2 - Symptomatic, <50% in bed during the day (Ambulatory and capable of all self care but unable to carry out any work activities. Up and about more than 50% of waking hours)  3 - Symptomatic, >50% in bed, but not bedbound (Capable of only limited self-care, confined to bed or chair 50% or more of waking hours)  4 - Bedbound (Completely disabled. Cannot carry on any self-care. Totally confined to bed or chair)  5 - Death   Santiago Glad MM, Creech RH, Tormey DC, et al. [phone]). "Toxicity and response criteria of the [facility]". Am. Evlyn Clines. Oncol. 5 (6): 649-55    LABORATORY DATA:  Lab Results  Component Value Date   WBC 5.3 08/14/2023   HGB 12.5 08/14/2023   HCT 36.7 08/14/2023   MCV 96.1 08/14/2023   PLT 286 08/14/2023   Lab Results  Component Value Date   NA 141 08/14/2023   K 3.4 (L) 08/14/2023   CL 105 08/14/2023   CO2 28 08/14/2023   Lab Results  Component Value Date   ALT <5 08/14/2023   AST 15 08/14/2023   GGT 513 (H) 03/28/2023   ALKPHOS 88 08/14/2023   BILITOT 1.0 08/14/2023      RADIOGRAPHY: VAS [place] LOWER EXTREMITY VENOUS (DVT)  Result Date: 08/14/2023  Lower Venous DVT Study Patient Name:  MARSHELIA CORSE  Date of Exam:   08/14/2023 Medical Rec #: [id number]       Accession #:    [id number] Date of Birth: [birth year]       Patient Gender: F Patient Age:   68 years Exam Location:  [facility] Procedure:      VAS [place] LOWER EXTREMITY VENOUS (DVT) Referring Phys: Clayborn Heron BURTON --------------------------------------------------------------------------------  Indications: Pain.  Risk Factors: Pancreatic cancer / chemotherapy. Limitations: Poor ultrasound/tissue interface. Comparison Study: No previous exams Performing Technologist: [facility]  RVT, RDMS  Examination Guidelines: A complete evaluation includes B-mode imaging, spectral Doppler, color Doppler, and power  Doppler as needed of all accessible portions of each vessel. Bilateral testing is considered an integral part of a complete examination. Limited examinations for reoccurring indications may be performed as noted. The reflux portion of the exam is performed with the patient in reverse Trendelenburg.  +---------+---------------+---------+-----------+----------+--------------+ RIGHT    CompressibilityPhasicitySpontaneityPropertiesThrombus Aging +---------+---------------+---------+-----------+----------+--------------+ CFV      Full           Yes      Yes                                 +---------+---------------+---------+-----------+----------+--------------+ SFJ      Full                                                        +---------+---------------+---------+-----------+----------+--------------+ FV Prox  Full           Yes      Yes                                 +---------+---------------+---------+-----------+----------+--------------+ FV Mid   Full           Yes      Yes                                 +---------+---------------+---------+-----------+----------+--------------+ FV DistalFull           Yes      Yes                                 +---------+---------------+---------+-----------+----------+--------------+ PFV      Full                                                        +---------+---------------+---------+-----------+----------+--------------+ POP      Full           Yes      Yes                                 +---------+---------------+---------+-----------+----------+--------------+ PTV      Full                                                        +---------+---------------+---------+-----------+----------+--------------+ PERO     Full                                                        +---------+---------------+---------+-----------+----------+--------------+    +---------+---------------+---------+-----------+----------+--------------+  LEFT     CompressibilityPhasicitySpontaneityPropertiesThrombus Aging +---------+---------------+---------+-----------+----------+--------------+ CFV      Full           Yes      Yes                                 +---------+---------------+---------+-----------+----------+--------------+ SFJ      Full                                                        +---------+---------------+---------+-----------+----------+--------------+ FV Prox  Full           Yes      Yes                                 +---------+---------------+---------+-----------+----------+--------------+ FV Mid   Full           Yes      Yes                                 +---------+---------------+---------+-----------+----------+--------------+ FV DistalFull           Yes      Yes                                 +---------+---------------+---------+-----------+----------+--------------+ PFV      Full                                                        +---------+---------------+---------+-----------+----------+--------------+ POP      Full           Yes      Yes                                 +---------+---------------+---------+-----------+----------+--------------+ PTV      Full                                                        +---------+---------------+---------+-----------+----------+--------------+ PERO     Full                                                        +---------+---------------+---------+-----------+----------+--------------+     Summary: BILATERAL: - No evidence of deep vein thrombosis seen in the lower extremities, bilaterally. -No evidence of popliteal cyst, bilaterally.   *See table(s) above for measurements and observations. Electronically signed by Heath Lark on 08/14/2023 at 6:22:47 PM.    Final    DG ERCP  Result Date: 08/08/2023 CLINICAL DATA:  77 year old  female with a history  of pancreatic cancer. EXAM: ERCP TECHNIQUE: Multiple spot images obtained with the fluoroscopic device and submitted for interpretation post-procedure. FLUOROSCOPY: Radiation Exposure Index (as provided by the fluoroscopic device): 17.6 mGy Kerma COMPARISON:  CT 08/05/2023 FINDINGS: Limited intraoperative fluoroscopic spot images during ERCP. Initial image demonstrates the endoscope projecting over the upper abdomen, with plastic biliary stent in position. Subsequently there is placement of a safety wire and partial opacification of the intrahepatic and extrahepatic biliary system. Removal of a plastic biliary stent and placement of a metallic biliary stent. IMPRESSION: Limited images during ERCP demonstrates removal of plastic biliary stent and placement of metallic biliary stent. Please refer to the dictated operative report for full details of intraoperative findings and procedure. Electronically Signed   By: Gilmer Mor D.O.   On: 08/08/2023 16:06   CT PANCREAS ABDOMEN W WO CONTRAST  Result Date: 08/08/2023 CLINICAL DATA:  Pancreatic cancer restaging * Tracking Code: BO * EXAM: CT ABDOMEN WITHOUT AND WITH CONTRAST TECHNIQUE: Multidetector CT imaging of the abdomen was performed following the standard protocol before and following the bolus administration of intravenous contrast. RADIATION DOSE REDUCTION: This exam was performed according to the departmental dose-optimization program which includes automated exposure control, adjustment of the mA and/or kV according to patient size and/or use of iterative reconstruction technique. CONTRAST:  OMNIPAQUE IOHEXOL 300 MG/ML  SOLN COMPARISON:  03/27/2023 FINDINGS: Lower chest: Mitral valve calcification. Descending thoracic aortic atherosclerosis. Lingular subsegmental atelectasis. Hepatobiliary: No metastatic lesions are identified in the liver. Biliary stent extends from the common hepatic duct into the duodenum. Common hepatic duct  caliber 1.1 cm, distal CBD is collapsed around the stent. Scattered transient hepatic attenuation difference in the liver without definite underlying lesion identified on arterial phase images. No hepatic parenchymal lesion is observed on portal venous phase images aside from mild focal steatosis in segment 4 adjacent to the falciform ligament. Pancreas: Severely atrophic pancreatic tail and body. Abnormal hypoenhancing lesion at the junction of the pancreatic body and head is reduced in size, currently about 1.7 by 1.6 cm on image 46 series 4, previously approximately 2.3 by 1.8 cm by my measurements. This abuts the left side of the superior mesenteric vein. Spleen: Unremarkable Adrenals/Urinary Tract: Calcifications in the adrenal glands likely from remote insult. Kidneys unremarkable. Stomach/Bowel: Wall thickening in the stomach antrum may be from nondistention or gastritis. Vascular/Lymphatic: Web like narrowing in the distal portal vein on image 38 of series 7 and image 42 series 13. Just proximal to this we demonstrate a 1.8 by 0.6 by 1.0 cm nonocclusive thrombus in the portal vein. The SMV and splenic vein appear otherwise unremarkable and fully opacified. Atherosclerosis is present, including aortoiliac atherosclerotic disease. Left periaortic node 0.8 cm in short axis on image 46 series 7, formerly the same. Small porta hepatis lymph nodes are not pathologically enlarged. Other: No supplemental non-categorized findings. Musculoskeletal: Right foraminal impingement at L2-3 and L3-4 and to a lesser extent at L4-5 due to intervertebral and facet spurring. IMPRESSION: 1. Interval reduction in size of the pancreatic mass at the junction of the pancreatic head and body, currently 1.7 by 1.6 cm, previously 2.3 by 1.8 cm. 2. 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 3. No findings of metastatic disease in the liver. 4.  Biliary stent in place. 5. Wall thickening in the stomach antrum may be from nondistention or gastritis. 6. Right foraminal impingement  at L2-3 and L3-4 and to a lesser extent at L4-5 due to intervertebral and facet spurring. 7. Aortic atherosclerosis. Aortic Atherosclerosis (ICD10-I70.0). Electronically Signed   By: Gaylyn Rong M.D.   On: 08/08/2023 08:44       IMPRESSION/PLAN: 1. Stage IB, cT2N0M0, adenocarcinoma of the head of pancreas. Dr. Mitzi Hansen discusses the pathology findings and reviews the nature of *** We discussed the risks, benefits, short, and long term effects of radiotherapy, as well as the definitive intent, and the patient is interested in proceeding. Dr. Mitzi Hansen discusses the delivery and logistics of radiotherapy and anticipates a course of 5 fractions of radiotherapy. Written consent is obtained and placed in the chart, a copy was provided to the patient. The patient will be contacted to coordinate treatment planning by our simulation department.    In a visit lasting *** minutes, greater than 50% of the time was spent face to face discussing the patient's condition, in preparation for the discussion, and coordinating the patient's care.   The above documentation reflects my direct findings during this shared patient visit. Please see the separate note by Dr. Mitzi Hansen on this date for the remainder of the patient's plan of care.    Osker Mason, [facility]   **Disclaimer: This note was dictated with voice recognition software. Similar sounding words can inadvertently be transcribed and this note may contain transcription errors which may not have been corrected upon publication of note.**

## 2023-08-19 NOTE — Progress Notes (Incomplete)
GI Location of Tumor / Histology: Pancreatic- Head  Marie Ford presented to the ED in April 2024 with jaundice.  EUS 08/08/2023:  ERCP with stent exchange and fiducial marker 08/08/2023:  CT Abdomen 08/05/2023:  Interval reduction in size of the pancreatic mass at the junction of the pancreatic head and body, currently 1.7 by 1.6 cm, previously 2.3 by 1.8 cm.  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 No findings of metastatic disease in the liver.  Biliary stent in place.  EUS 04/08/2023: 30 x 23 mm pancreatic head lesion without any vascular involvement.    ERCP and stent placement 03/29/2023:   CT AP 03/2023: pancreatic head lesion with involvement of the distal CBD and associated biliary ductal dilation.     Biopsies of Pancreatic Head Mass 04/08/2023    Past/Anticipated interventions by surgeon, if any:  Dr. Donell Beers -Pt declined Whipple surgery   Past/Anticipated interventions by medical oncology, if any:  Marie Glad NP / Dr. [name] 08/14/2023 -She started first line chemo with Gemzar/Abraxane 05/27/2023.   -Marie Ford appears frail but stable.  S/p cycle 3-day 1 gemcitabine/Abraxane (every 2 weeks), tolerating fairly well with moderate nausea and progressive fatigue.  -Marie Ford is not able to recover very well with much meaningful quality of life between chemo cycles.  She prefers to proceed with dose reduced chemo today then move to consolidation radiation.  -I will refer her back to Dr. Mitzi Hansen -Will cancel chemo in 2 weeks, which is her [birth year]. F/up in 3 weeks for close monitoring -I plan to continue disease monitoring after radiation, and restarted chemotherapy only when she has disease progression and she is a candidate for chemo and willing to take    Weight changes, if any: Stable.  Bowel/Bladder complaints, if any: Taking miralax daily.  Nausea / Vomiting, if any: She  reports nausea, taking compazine twice daily.  Reports this does not work well.  Has tried other antiemetics with little to no relief and unwanted side effects.  Pain issues, if any:  Intermittent dull/ sharp pain in her right upper abdomen (under rib cage) that radiates around to her right side and back.  Appetite: Decreased.  She is supplementing with ensure.  Getting in 1-2 per day.  She is drinking well.  SAFETY ISSUES: Prior radiation? No Pacemaker/ICD? No Possible current pregnancy? Postmenopausal Is the patient on methotrexate? No  Current Complaints/Details: -Lives at [facility]

## 2023-08-21 ENCOUNTER — Ambulatory Visit
Admission: RE | Admit: 2023-08-21 | Discharge: 2023-08-21 | Disposition: A | Discharge status: Home / Self Care | Payer: Medicare PPO | Source: Ambulatory Visit | Attending: Radiation Oncology | Admitting: Radiation Oncology

## 2023-08-21 ENCOUNTER — Encounter: Payer: Self-pay | Admitting: Radiation Oncology

## 2023-08-21 VITALS — BP 163/78 | HR 77 | Temp 98.6°F | Resp 18 | Wt 142.8 lb

## 2023-08-21 DIAGNOSIS — Z87440 Personal history of urinary (tract) infections: Secondary | ICD-10-CM | POA: Diagnosis not present

## 2023-08-21 DIAGNOSIS — M1610 Unilateral primary osteoarthritis, unspecified hip: Secondary | ICD-10-CM | POA: Diagnosis not present

## 2023-08-21 DIAGNOSIS — Z79899 Other long term (current) drug therapy: Secondary | ICD-10-CM | POA: Insufficient documentation

## 2023-08-21 DIAGNOSIS — C250 Malignant neoplasm of head of pancreas: Secondary | ICD-10-CM

## 2023-08-21 DIAGNOSIS — I129 Hypertensive chronic kidney disease with stage 1 through stage 4 chronic kidney disease, or unspecified chronic kidney disease: Secondary | ICD-10-CM | POA: Diagnosis not present

## 2023-08-21 DIAGNOSIS — Z800 Family history of malignant neoplasm of digestive organs: Secondary | ICD-10-CM | POA: Insufficient documentation

## 2023-08-21 DIAGNOSIS — I872 Venous insufficiency (chronic) (peripheral): Secondary | ICD-10-CM | POA: Diagnosis not present

## 2023-08-21 DIAGNOSIS — M8580 Other specified disorders of bone density and structure, unspecified site: Secondary | ICD-10-CM | POA: Insufficient documentation

## 2023-08-21 DIAGNOSIS — E782 Mixed hyperlipidemia: Secondary | ICD-10-CM | POA: Diagnosis not present

## 2023-08-21 DIAGNOSIS — N189 Chronic kidney disease, unspecified: Secondary | ICD-10-CM | POA: Insufficient documentation

## 2023-08-21 DIAGNOSIS — I81 Portal vein thrombosis: Secondary | ICD-10-CM | POA: Insufficient documentation

## 2023-08-21 DIAGNOSIS — I700 Atherosclerosis of aorta: Secondary | ICD-10-CM | POA: Insufficient documentation

## 2023-08-21 DIAGNOSIS — G20A1 Parkinson's disease without dyskinesia, without mention of fluctuations: Secondary | ICD-10-CM | POA: Diagnosis not present

## 2023-08-22 ENCOUNTER — Encounter: Payer: Self-pay | Admitting: Nurse Practitioner

## 2023-08-22 ENCOUNTER — Ambulatory Visit: Payer: Medicare PPO | Admitting: Internal Medicine

## 2023-08-22 VITALS — BP 110/70 | HR 83 | Temp 97.5°F | Resp 18 | Ht 66.0 in | Wt 142.5 lb

## 2023-08-22 DIAGNOSIS — H6993 Unspecified Eustachian tube disorder, bilateral: Secondary | ICD-10-CM | POA: Diagnosis not present

## 2023-08-22 NOTE — Progress Notes (Signed)
Office Visit  Subjective   Patient ID: Marie Ford   DOB: April 07, 1946   Age: 77 y.o.   MRN: [id number]   Chief Complaint No chief complaint on file.    History of Present Illness Marie Ford comes in today for an acute visit with complaints of feeling her ears "stopped up" for the last 4 months.  She is now having ear pain on the right.  She is having mild decreased hearing out of both ears.  She has sinus congestion with clear nasal discharge and post nasal drip without sneezing or coughing  She uses nasacort twice a day.  She does have a history of allergies.       Past Medical History Past Medical History:  Diagnosis Date   Chronic kidney disease    stage 3    Chronic sphenoidal sinusitis    Degenerative arthritis of hip    requirde surgery 2009   Depression 2010   Diplopia 2011   in left lateral gaze    Diplopia    " my eyes dont track together, i have corrective prisms in my eye glasses"    Diverticulosis 2006   noted in a colonoscopy   Esophageal reflux 2013   Hypertension    IGT (impaired glucose tolerance) 2011   denies hx of diabetes    Insomnia 2009   Medical history non-contributory    Mixed hyperlipidemia 2000   Osteoarthritis    Osteopenia 2005   Other and unspecified hyperlipidemia 2000   Other specified disease of sebaceous glands    Palpitations 2009   secondary to PSVT   Parkinson disease    PNA (pneumonia) 2014   Psoriasis 2004   PSVT (paroxysmal supraventricular tachycardia) 2009   "report it has been some time since i had that "    Situational stress    Urinary tract infection    thru wed 05/15/23, completed 3d antibiotics   Venous insufficiency since 2017     Allergies Allergies  Allergen Reactions   Erythromycin Shortness Of Breath and Rash   Macrobid [Nitrofurantoin] Other (See Comments)    Pass out   Keflex [Cephalexin] Nausea And Vomiting   Levofloxacin Nausea Only   Penicillins Rash    Medicinal Product Containing Penicillin  And Acting As Antibacterial Agent (Product)   Sulfamethoxazole-Trimethoprim Rash     Medications  Current Outpatient Medications:    ALPRAZolam (XANAX) 0.5 MG tablet, Take 0.25 mg by mouth as needed for anxiety. Takes as needed for sleep, Disp: , Rfl:    AUVELITY 45-105 MG TBCR, Take 1 tablet by mouth every evening., Disp: , Rfl:    buPROPion (WELLBUTRIN XL) 150 MG 24 hr tablet, Take 150 mg by mouth daily., Disp: , Rfl:    Calcium Carbonate Antacid (CALCIUM CARBONATE PO), Take by mouth., Disp: , Rfl:    carbidopa-levodopa (SINEMET IR) 25-100 MG tablet, Take 1.5 tablets by mouth every 4 (four) hours as needed. -Try to separate Sinemet from food (especially protein-rich foods like meat, dairy, eggs) by about 30-60 mins - this will help the absorption of the medication. If you have some nausea with the medication, you can take it with some light food like crackers or ginger ale, Disp: 540 tablet, Rfl: 4   donepezil (ARICEPT) 10 MG tablet, Take 1 tablet (10 mg total) by mouth at bedtime., Disp: 30 tablet, Rfl: 5   dronabinol (MARINOL) 2.5 MG capsule, Take 1 capsule (2.5 mg total) by mouth 2 (two) times daily before  a meal. Ok to refill on 08/06/2023, Disp: 60 capsule, Rfl: 0   lidocaine-prilocaine (EMLA) cream, Apply to affected area once (Patient not taking: Reported on 08/21/2023), Disp: 30 g, Rfl: 3   losartan (COZAAR) 50 MG tablet, Take 1 tablet (50 mg total) by mouth daily., Disp: 90 tablet, Rfl: 1   magic mouthwash (multi-ingredient) oral suspension, Take 5 mLs by mouth 4 (four) times daily., Disp: 240 mL, Rfl: 1   Multiple Vitamin (MULTIVITAMIN WITH MINERALS) TABS tablet, Take 1 tablet by mouth daily. Women's Multivitamin, Disp: , Rfl:    OVER THE COUNTER MEDICATION, Take 1 tablet by mouth daily as needed (constipation)., Disp: , Rfl:    pantoprazole (PROTONIX) 40 MG tablet, Take 40 mg by mouth daily., Disp: , Rfl:    Polyethyl Glycol-Propyl Glycol (SYSTANE) 0.4-0.3 % SOLN, Place 1 drop into  both eyes 3 (three) times daily as needed (dry/irritated eyes.)., Disp: , Rfl:    polyethylene glycol (MIRALAX / GLYCOLAX) 17 g packet, Take 17 g by mouth daily., Disp: , Rfl:    pravastatin (PRAVACHOL) 80 MG tablet, Take 1 tablet (80 mg total) by mouth every evening., Disp: , Rfl:    prochlorperazine (COMPAZINE) 10 MG tablet, Take 1 tablet (10 mg total) by mouth every 6 (six) hours as needed for nausea or vomiting. (Patient not taking: Reported on 08/21/2023), Disp: 30 tablet, Rfl: 1   promethazine (PHENERGAN) 25 MG tablet, Take 25 mg by mouth every 6 (six) hours as needed for nausea or vomiting. (Patient not taking: Reported on 08/21/2023), Disp: , Rfl:    triamcinolone (NASACORT) 55 MCG/ACT AERO nasal inhaler, Place 2 sprays into the nose daily. Use at night (Patient taking differently: Place 1 spray into the nose daily. Use at night), Disp: 1 each, Rfl: 12   Review of Systems Review of Systems  Constitutional:  Negative for chills and fever.  HENT:  Positive for congestion, ear pain, hearing loss and tinnitus. Negative for sinus pain and sore throat.   Respiratory:  Negative for cough, shortness of breath and wheezing.   Neurological:  Negative for dizziness, weakness and headaches.       Objective:    Vitals There were no vitals taken for this visit.   Physical Examination Physical Exam Constitutional:      Appearance: Normal appearance. She is not ill-appearing.  HENT:     Right Ear: Tympanic membrane, ear canal and external ear normal.     Left Ear: Tympanic membrane, ear canal and external ear normal.     Nose: Congestion present. No rhinorrhea.     Mouth/Throat:     Mouth: Mucous membranes are moist.     Pharynx: Oropharynx is clear. No oropharyngeal exudate or posterior oropharyngeal erythema.  Cardiovascular:     Rate and Rhythm: Normal rate and regular rhythm.     Pulses: Normal pulses.     Heart sounds: No murmur heard.    No friction rub. No gallop.  Pulmonary:      Effort: Pulmonary effort is normal. No respiratory distress.     Breath sounds: No wheezing, rhonchi or rales.  Abdominal:     General: Bowel sounds are normal. There is no distension.     Palpations: Abdomen is soft.     Tenderness: There is no abdominal tenderness.  Musculoskeletal:     Right lower leg: No edema.     Left lower leg: No edema.  Skin:    General: Skin is warm and dry.  Findings: No rash.  Neurological:     Mental Status: She is alert.        Assessment & Plan:   Eustachian tube dysfunction, bilateral She has some fluid behind her ears bilaterally but there is no evidence of infection and there is no cerumen impaction.  I want her to add zyrtec to her nasacort regimen and if these is no improvement in the next 10 days, we could start her on a short course of prednisone.    No follow-ups on file.   Crist Fat, MD

## 2023-08-22 NOTE — Assessment & Plan Note (Signed)
She has some fluid behind her ears bilaterally but there is no evidence of infection and there is no cerumen impaction.  I want her to add zyrtec to her nasacort regimen and if these is no improvement in the next 10 days, we could start her on a short course of prednisone.

## 2023-08-23 ENCOUNTER — Ambulatory Visit: Payer: Medicare PPO | Admitting: Radiation Oncology

## 2023-08-23 ENCOUNTER — Encounter: Payer: Self-pay | Admitting: Radiation Oncology

## 2023-08-23 ENCOUNTER — Other Ambulatory Visit: Payer: Self-pay

## 2023-08-23 ENCOUNTER — Ambulatory Visit: Payer: Medicare PPO

## 2023-08-23 ENCOUNTER — Telehealth: Payer: Self-pay | Admitting: Neurology

## 2023-08-23 NOTE — Telephone Encounter (Signed)
Pt called to schedule appt because she thinks that the medication may be affecting her chemo and she would like to discuss with MD.

## 2023-08-24 ENCOUNTER — Other Ambulatory Visit: Payer: Self-pay

## 2023-08-26 NOTE — Telephone Encounter (Signed)
Called pt to try to get a little more information about her concerns. Left office number and asked for call back. Looks like she scheduled an appt for 09/30/23.

## 2023-08-26 NOTE — Telephone Encounter (Signed)
Thank you , please let me know what she says.

## 2023-08-27 ENCOUNTER — Ambulatory Visit: Payer: Medicare PPO

## 2023-08-27 ENCOUNTER — Ambulatory Visit: Payer: Medicare PPO | Admitting: Hematology

## 2023-08-27 ENCOUNTER — Other Ambulatory Visit: Payer: Medicare PPO

## 2023-09-02 ENCOUNTER — Ambulatory Visit
Admission: RE | Admit: 2023-09-02 | Discharge: 2023-09-02 | Disposition: A | Discharge status: Home / Self Care | Payer: Medicare PPO | Source: Ambulatory Visit | Attending: Radiation Oncology

## 2023-09-02 ENCOUNTER — Ambulatory Visit
Admission: RE | Admit: 2023-09-02 | Discharge: 2023-09-02 | Disposition: A | Discharge status: Home / Self Care | Payer: Medicare PPO | Source: Ambulatory Visit | Attending: Radiation Oncology | Admitting: Radiation Oncology

## 2023-09-02 VITALS — BP 129/103 | HR 77 | Temp 97.5°F | Resp 18

## 2023-09-02 DIAGNOSIS — C250 Malignant neoplasm of head of pancreas: Secondary | ICD-10-CM | POA: Insufficient documentation

## 2023-09-02 MED ORDER — SODIUM CHLORIDE 0.9% FLUSH
10.0000 mL | Freq: Once | INTRAVENOUS | Status: AC
  Administered 2023-09-02: 10 mL via INTRAVENOUS

## 2023-09-02 NOTE — Progress Notes (Signed)
Has armband been applied?  Yes  Does patient have an allergy to IV contrast dye?: No   Has patient ever received premedication for IV contrast dye?: n/a   Does patient take metformin?: No  If patient does take metformin when was the last dose: n/a  Date of lab work: 08/14/2023 BUN: 18 CR: 1.10 eGfr: 51  IV site: LAC  Has IV site been added to flowsheet?  Yes

## 2023-09-04 ENCOUNTER — Telehealth: Payer: Self-pay

## 2023-09-04 ENCOUNTER — Other Ambulatory Visit: Payer: Self-pay

## 2023-09-04 ENCOUNTER — Encounter: Payer: Self-pay | Admitting: Physician Assistant

## 2023-09-04 DIAGNOSIS — C250 Malignant neoplasm of head of pancreas: Secondary | ICD-10-CM

## 2023-09-04 NOTE — Telephone Encounter (Signed)
Patient called in stating she is having stomach issues the past 3 days, she is having burning in her stomach when she eats, nausea, and stomach upset. She is feeling weak, dizzy in the mornings also shaky. She also is having a headache behind her eyes and a runny nose. She tested negative for covid no fever and these symptoms have been going on for 3 days.

## 2023-09-05 ENCOUNTER — Inpatient Hospital Stay: Payer: Medicare PPO

## 2023-09-05 ENCOUNTER — Other Ambulatory Visit: Payer: Medicare PPO

## 2023-09-05 ENCOUNTER — Inpatient Hospital Stay (HOSPITAL_BASED_OUTPATIENT_CLINIC_OR_DEPARTMENT_OTHER): Payer: Medicare PPO | Admitting: Physician Assistant

## 2023-09-05 VITALS — BP 147/78 | HR 71 | Temp 97.5°F | Resp 17 | Wt 141.5 lb

## 2023-09-05 DIAGNOSIS — Z95828 Presence of other vascular implants and grafts: Secondary | ICD-10-CM | POA: Diagnosis not present

## 2023-09-05 DIAGNOSIS — C250 Malignant neoplasm of head of pancreas: Secondary | ICD-10-CM | POA: Diagnosis not present

## 2023-09-05 DIAGNOSIS — Z5111 Encounter for antineoplastic chemotherapy: Secondary | ICD-10-CM | POA: Diagnosis not present

## 2023-09-05 LAB — CBC WITH DIFFERENTIAL (CANCER CENTER ONLY)
Abs Immature Granulocytes: 0.05 10*3/uL (ref 0.00–0.07)
Basophils Absolute: 0 10*3/uL (ref 0.0–0.1)
Basophils Relative: 1 %
Eosinophils Absolute: 0.1 10*3/uL (ref 0.0–0.5)
Eosinophils Relative: 1 %
HCT: 37.5 % (ref 36.0–46.0)
Hemoglobin: 12.5 g/dL (ref 12.0–15.0)
Immature Granulocytes: 1 %
Lymphocytes Relative: 21 %
Lymphs Abs: 1.3 10*3/uL (ref 0.7–4.0)
MCH: 32.1 pg (ref 26.0–34.0)
MCHC: 33.3 g/dL (ref 30.0–36.0)
MCV: 96.2 fL (ref 80.0–100.0)
Monocytes Absolute: 1.5 10*3/uL — ABNORMAL HIGH (ref 0.1–1.0)
Monocytes Relative: 25 %
Neutro Abs: 3.1 10*3/uL (ref 1.7–7.7)
Neutrophils Relative %: 51 %
Platelet Count: 385 10*3/uL (ref 150–400)
RBC: 3.9 MIL/uL (ref 3.87–5.11)
RDW: 14 % (ref 11.5–15.5)
WBC Count: 6.1 10*3/uL (ref 4.0–10.5)
nRBC: 0 % (ref 0.0–0.2)

## 2023-09-05 LAB — CMP (CANCER CENTER ONLY)
ALT: <5 U/L (ref 0–44)
AST: 15 U/L (ref 15–41)
Albumin: 3.8 g/dL (ref 3.5–5.0)
Alkaline Phosphatase: 73 U/L (ref 38–126)
Anion gap: 7 (ref 5–15)
BUN: 22 mg/dL (ref 8–23)
CO2: 28 mmol/L (ref 22–32)
Calcium: 9.1 mg/dL (ref 8.9–10.3)
Chloride: 105 mmol/L (ref 98–111)
Creatinine: 1 mg/dL (ref 0.44–1.00)
GFR, Estimated: 57 mL/min — ABNORMAL LOW (ref >60)
Glucose, Bld: 122 mg/dL — ABNORMAL HIGH (ref 70–99)
Potassium: 3.5 mmol/L (ref 3.5–5.1)
Sodium: 140 mmol/L (ref 135–145)
Total Bilirubin: 0.7 mg/dL (ref 0.3–1.2)
Total Protein: 6.7 g/dL (ref 6.5–8.1)

## 2023-09-05 MED ORDER — HEPARIN SOD (PORK) LOCK FLUSH 100 UNIT/ML IV SOLN: 500.0000 [IU] | Freq: Once | INTRAVENOUS | Status: DC | PRN

## 2023-09-05 MED ORDER — SODIUM CHLORIDE 0.9% FLUSH
10.0000 mL | INTRAVENOUS | Status: DC | PRN
  Administered 2023-09-05: 10 mL

## 2023-09-05 MED ORDER — SODIUM CHLORIDE 0.9% FLUSH: 10.0000 mL | INTRAVENOUS | Status: DC | PRN

## 2023-09-05 NOTE — Progress Notes (Signed)
Symptom Management Consult Note [facility]    Patient Care Team: [name], MD as PCP - General (Family Medicine) Malachy Mood, MD as Consulting Physician (Oncology)    Name / MRN / DOB: Marie Ford  [id number]  01-Aug-1946   Date of visit: 09/05/2023   Chief Complaint/Reason for visit: nausea and dizziness   Current Therapy: dose-reduced Gem/abraxane   Last treatment:  Day 1   Cycle 4 on 08/14/23   ASSESSMENT & PLAN: Patient is a 77 y.o. female with oncologic history of pancreatic cancer followed by Dr. [name].  I have viewed most recent oncology note and lab work.    #Pancreatic cancer -Plan per last oncology note was to stop chemotherapy after 3 cycles and proceed with radiation. Oncologist will restart chemotherapy only when she has disease progression and she is a candidate for chemo and willing to take it. - Next appointment with oncologist is 09/09/23. CA 19-9 was collected in clinic today as part of standing order. Result will be discussed in detail with oncologist at next appointment.   #Nausea and dizziness -Symptoms resolved PTA. Patient currently asymptomatic. -CBC and CMP overall unremarkable. Patient declines need for IVF or antiemetic. No abdominal tenderness.  -Recommended checking UA however patient declines has she ha no urinary symptoms. Patient asking to hold off on additional work up at this time given she feels much improved.    Strict ED precautions discussed should symptoms worsen.   Heme/Onc History: Oncology History Overview Note   Cancer Staging  Pancreatic cancer [facility]) Staging form: Exocrine Pancreas, AJCC 8th Edition - Clinical: Stage IB (cT2, cN0, cM0) - Signed by Heilingoetter, Cassandra L, PA-C on 04/12/2023 Total positive nodes: 0     Pancreatic cancer (HCC)  03/27/2023 Imaging   CT ABDOMEN PELVIS W CONTRAST   IMPRESSION: Low-attenuation mass centered along the pancreatic head with involvement of the distal  common duct with the associated biliary duct ductal dilatation and pancreatic atrophy. Changes are worrisome for neoplasm either a pancreatic neoplasm or cholangiocarcinoma. Recommend further evaluation.   No liver metastases. No pathologically enlarged upper abdominal nodes.   Diffuse colonic stool.  Normal appendix.  Few diverticula.   Portions of the pelvis are obscured by the streak artifact from the bilateral hip arthroplasties.   03/29/2023 Imaging   DG ERCP   IMPRESSION: Intraoperative fluoroscopic images show dilated intra or extrahepatic bile ducts with high-grade stenosis at the level of the distal CBD. Biliary tree is decompressed following placement of common bile duct stent.   04/08/2023 Pathology Results   CYTOLOGY - NON PAP  CASE: [id number]  PATIENT: Quanika Berrong  Non-Gynecological Cytology Report   FINAL MICROSCOPIC DIAGNOSIS:  A. PANCREAS, HEAD, FINE NEEDLE ASPIRATION:  - Malignant cells present (see Comment)      04/08/2023 Procedure   EUS by Dr. Meridee Score   EUS impression  - A mass was identified in the pancreatic head/ genu of the pancreas. Cytology results are pending. However, the endosonographic appearance is highly suspicious for adenocarcinoma. This was staged T2 N0 Mx by endosonographic criteria. The staging applies if malignancy is confirmed. Fine needle biopsy performed.  - There was dilation in the common bile duct and in the common hepatic duct which measured up to 8 mm.  - One stent was visualized endosonographically within the common bile duct.  - Hyperechoic material consistent with sludge was visualized endosonographically in the gallbladder.  - Two enlarged lymph nodes were visualized in the celiac region (  level 20) and porta hepatis region. Tissue has not been obtained. However, the endosonographic appearance is suggestive of benign inflammatory change based on the visualization endosonographically.   04/12/2023 Initial Diagnosis    Pancreatic cancer (HCC)   04/12/2023 Cancer Staging   Staging form: Exocrine Pancreas, AJCC 8th Edition - Clinical: Stage IB (cT2, cN0, cM0) - Signed by Heilingoetter, Cassandra L, PA-C on 04/12/2023 Total positive nodes: 0   04/12/2023 Tumor Marker   Patient's tumor was tested for the following markers: CA 19.9. Results of the tumor marker test revealed 512.   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  Report date is 05/03/2023.   The CancerNext-Expanded gene panel offered by [facility] and includes sequencing, rearrangement, and RNA analysis for the following 71 genes:  AIP, ALK, APC, ATM, BAP1, BARD1, BMPR1A, BRCA1, BRCA2, BRIP1, CDC73, CDH1, CDK4, CDKN1B, CDKN2A, CHEK2, DICER1, FH, FLCN, KIF1B, LZTR1,MAX, MEN1, MET, MLH1, MSH2, MSH6, MUTYH, NF1, NF2, NTHL1, PALB2, PHOX2B, PMS2, POT1, PRKAR1A, PTCH1, PTEN, RAD51C,RAD51D, RB1, RET, SDHA, SDHAF2, SDHB, SDHC, SDHD, SMAD4, SMARCA4, SMARCB1, SMARCE1, STK11, SUFU, TMEM127, TP53,TSC1, TSC2 and VHL (sequencing and deletion/duplication); AXIN2, CTNNA1, EGFR, EGLN1, HOXB13, KIT, MITF, MSH3, PDGFRA, POLD1 and POLE (sequencing only); EPCAM and GREM1 (deletion/duplication only).     05/27/2023 -  Chemotherapy   Patient is on Treatment Plan : PANCREATIC Abraxane D1,8,15 + Gemcitabine D1,8,15 q28d         Interval history-: Marie Ford is a 77 y.o. female with oncologic history as above presenting to [facility] today with chief complaint of nausea and dizziness. She is accompanied by care giver who provides additional information.  Patient reports when she woke up Monday morning (3 days ago) she felt generalized weakness. As the day went on she noticed her typical shakiness seemed worse than usual. She was unable to tell if shakiness was related to her Parkinson disease or not. She also had episodes of sweatiness and dizziness without fever. These symptoms lasted x 3 days. When she woke up today she felt back to her  usual self. She did not taken any OTC medications for her symptoms. She denies any sick contacts. She was able to eat baked beans for breakfast this morning, no abdominal pain or nausea. She denies any urinary symptoms.   Patient adds that she had her covid vaccine x 2 days ago. She is scheduled to see ENT next week as follow up from sinus surgery.   ROS  All other systems are reviewed and are negative for acute change except as noted in the HPI.    Allergies  Allergen Reactions   Erythromycin Shortness Of Breath and Rash   Macrobid [Nitrofurantoin] Other (See Comments)    Pass out   Keflex [Cephalexin] Nausea And Vomiting   Levofloxacin Nausea Only   Penicillins Rash    Medicinal Product Containing Penicillin And Acting As Antibacterial Agent (Product)   Sulfamethoxazole-Trimethoprim Rash     Past Medical History:  Diagnosis Date   Chronic kidney disease    stage 3    Chronic sphenoidal sinusitis    Degenerative arthritis of hip    requirde surgery 2009   Depression 2010   Diplopia 2011   in left lateral gaze    Diplopia    " my eyes dont track together, i have corrective prisms in my eye glasses"    Diverticulosis 2006   noted in a colonoscopy   Esophageal reflux 2013   Hypertension    IGT (  impaired glucose tolerance) 2011   denies hx of diabetes    Insomnia 2009   Medical history non-contributory    Mixed hyperlipidemia 2000   Osteoarthritis    Osteopenia 2005   Other and unspecified hyperlipidemia 2000   Other specified disease of sebaceous glands    Palpitations 2009   secondary to PSVT   Parkinson disease    PNA (pneumonia) 2014   Psoriasis 2004   PSVT (paroxysmal supraventricular tachycardia) 2009   "report it has been some time since i had that "    Situational stress    Urinary tract infection    thru wed 05/15/23, completed 3d antibiotics   Venous insufficiency since 2017     Past Surgical History:  Procedure Laterality Date   BILIARY BRUSHING   03/29/2023   Procedure: BILIARY BRUSHING;  Surgeon: Hilarie Fredrickson, MD;  Location: [facility] ENDOSCOPY;  Service: Gastroenterology;;   BILIARY DILATION  08/08/2023   Procedure: BILIARY DILATION;  Surgeon: Lemar Lofty., MD;  Location: [facility] ENDOSCOPY;  Service: Gastroenterology;;   BILIARY STENT PLACEMENT  03/29/2023   Procedure: BILIARY STENT PLACEMENT;  Surgeon: Hilarie Fredrickson, MD;  Location: [facility] ENDOSCOPY;  Service: Gastroenterology;;   BILIARY STENT PLACEMENT N/A 08/08/2023   Procedure: BILIARY STENT PLACEMENT;  Surgeon: Lemar Lofty., MD;  Location: [facility] ENDOSCOPY;  Service: Gastroenterology;  Laterality: N/A;   BIOPSY  04/08/2023   Procedure: BIOPSY;  Surgeon: Meridee Score Netty Starring., MD;  Location: [facility] ENDOSCOPY;  Service: Gastroenterology;;   CATARACT EXTRACTION W/ INTRAOCULAR LENS  IMPLANT, BILATERAL     COLONOSCOPY  01/30/2010   Jarold Motto   ENDOSCOPIC RETROGRADE CHOLANGIOPANCREATOGRAPHY (ERCP) WITH PROPOFOL N/A 08/08/2023   Procedure: ENDOSCOPIC RETROGRADE CHOLANGIOPANCREATOGRAPHY (ERCP) WITH PROPOFOL;  Surgeon: Lemar Lofty., MD;  Location: [facility] ENDOSCOPY;  Service: Gastroenterology;  Laterality: N/A;   ERCP N/A 03/29/2023   Procedure: ENDOSCOPIC RETROGRADE CHOLANGIOPANCREATOGRAPHY (ERCP);  Surgeon: Hilarie Fredrickson, MD;  Location: [facility] ENDOSCOPY;  Service: Gastroenterology;  Laterality: N/A;   ESOPHAGOGASTRODUODENOSCOPY N/A 08/08/2023   Procedure: ESOPHAGOGASTRODUODENOSCOPY (EGD);  Surgeon: Lemar Lofty., MD;  Location: [facility] ENDOSCOPY;  Service: Gastroenterology;  Laterality: N/A;   ESOPHAGOGASTRODUODENOSCOPY (EGD) WITH PROPOFOL N/A 04/08/2023   Procedure: ESOPHAGOGASTRODUODENOSCOPY (EGD) WITH PROPOFOL;  Surgeon: Meridee Score Netty Starring., MD;  Location: [facility] ENDOSCOPY;  Service: Gastroenterology;  Laterality: N/A;   ETHMOIDECTOMY Left 11/17/2020   Procedure: LEFT SIDED TOTAL ETHMOIDECTOMY;  Surgeon: Drema Halon, MD;  Location: [facility];  Service: ENT;   Laterality: Left;   EUS N/A 04/08/2023   Procedure: UPPER ENDOSCOPIC ULTRASOUND (EUS) LINEAR;  Surgeon: Lemar Lofty., MD;  Location: [facility] ENDOSCOPY;  Service: Gastroenterology;  Laterality: N/A;   EUS N/A 08/08/2023   Procedure: UPPER ENDOSCOPIC ULTRASOUND (EUS) LINEAR;  Surgeon: Lemar Lofty., MD;  Location: [facility] ENDOSCOPY;  Service: Gastroenterology;  Laterality: N/A;   EYE SURGERY     FIDUCIAL MARKER PLACEMENT  08/08/2023   Procedure: FIDUCIAL MARKER PLACEMENT;  Surgeon: Lemar Lofty., MD;  Location: [facility] ENDOSCOPY;  Service: Gastroenterology;;   FINE NEEDLE ASPIRATION N/A 04/08/2023   Procedure: FINE NEEDLE ASPIRATION (FNA) LINEAR;  Surgeon: Lemar Lofty., MD;  Location: [facility] ENDOSCOPY;  Service: Gastroenterology;  Laterality: N/A;   HEMORRHOID SURGERY     IR IMAGING GUIDED PORT INSERTION  05/17/2023   JOINT REPLACEMENT     LASIK     left medial and superior rectus muscle recesion for diplopia correction     REMOVAL OF STONES  08/08/2023   Procedure: REMOVAL OF  SLUDGE;  Surgeon: Mansouraty, Netty Starring., MD;  Location: [facility] ENDOSCOPY;  Service: Gastroenterology;;   SINUS ENDO WITH FUSION Left 11/17/2020   Procedure: SINUS ENDOSCOPY WITH FUSION NAVIGATION;  Surgeon: Drema Halon, MD;  [facility];  Service: ENT;  Laterality: Left;   SPHENOIDECTOMY Left 11/17/2020   Procedure: LEFT SIDED SPHENOIDECTOMY WITH TISSUE REMOVAL;  Surgeon: Drema Halon, MD;  Location: [facility];  Service: ENT;  Laterality: Left;   SPHINCTEROTOMY  03/29/2023   Procedure: SPHINCTEROTOMY;  Surgeon: Hilarie Fredrickson, MD;  Location: [facility] ENDOSCOPY;  Service: Gastroenterology;;   Francine Graven REMOVAL  08/08/2023   Procedure: STENT REMOVAL;  Surgeon: Lemar Lofty., MD;  Location: [facility] ENDOSCOPY;  Service: Gastroenterology;;   surgery of right eye rectus muscles for diplpia correction   2019   TOTAL HIP ARTHROPLASTY Right 2010   TOTAL HIP  ARTHROPLASTY Left 08/19/2019   Procedure: TOTAL HIP ARTHROPLASTY ANTERIOR APPROACH;  Surgeon: Ollen Gross, MD;  Location: [facility] ORS;  Service: Orthopedics;  Laterality: Left;    TURBINATE REDUCTION Left 11/17/2020   Procedure: LEFT SIDED TURBINATE REDUCTION;  Surgeon: Drema Halon, MD;  Location: [facility];  Service: ENT;  Laterality: Left;   URETHRAL SLING     midurethral sling with TVT Exact and Cystoscopy    Social History   Socioeconomic History   Marital status: Single    Spouse name: Not on file   Number of children: 1   Years of education: Not on file   Highest education level: Doctorate  Occupational History   Occupation: retired  Tobacco Use   Smoking status: Never   Smokeless tobacco: Never  Vaping Use   Vaping status: Never Used  Substance and Sexual Activity   Alcohol use: Yes    Comment: rarely   Drug use: No   Sexual activity: Not Currently    Birth control/protection: Post-menopausal  Other Topics Concern   Not on file  Social History Narrative   Lives at [facility]   Right handed   Caffeine: rarely   Social Determinants of Health   Financial Resource Strain: Not on file  Food Insecurity: No Food Insecurity (08/21/2023)   Hunger Vital Sign    Worried About Running Out of Food in the Last Year: Never true    Ran Out of Food in the Last Year: Never true  Transportation Needs: No Transportation Needs (08/21/2023)   PRAPARE - Administrator, Civil Service (Medical): No    Lack of Transportation (Non-Medical): No  Physical Activity: Not on file  Stress: Not on file  Social Connections: Unknown (04/20/2022)   Received from [facility]   Social Network    Social Network: Not on file  Intimate Partner Violence: Not At Risk (08/21/2023)   Humiliation, Afraid, Rape, and Kick questionnaire    Fear of Current or Ex-Partner: No    Emotionally Abused: No    Physically Abused: No     Sexually Abused: No    Family History  Problem Relation Age of Onset   Congestive Heart Failure Mother    CVA Father    Heart failure Father    Non-Hodgkin's lymphoma Sister    Alcohol abuse Sister    Liver cancer Brother    Colon cancer Brother 25   Colitis Brother    Hypertension Brother    Gout Brother    Melanoma Daughter    Parkinson's disease Cousin  Esophageal cancer Neg Hx    Rectal cancer Neg Hx    Stomach cancer Neg Hx    Colon polyps Neg Hx      Current Outpatient Medications:    ALPRAZolam (XANAX) 0.5 MG tablet, Take 0.25 mg by mouth as needed for anxiety. Takes as needed for sleep, Disp: , Rfl:    AUVELITY 45-105 MG TBCR, Take 1 tablet by mouth every evening., Disp: , Rfl:    buPROPion (WELLBUTRIN XL) 150 MG 24 hr tablet, Take 150 mg by mouth daily., Disp: , Rfl:    Calcium Carbonate Antacid (CALCIUM CARBONATE PO), Take by mouth., Disp: , Rfl:    carbidopa-levodopa (SINEMET IR) 25-100 MG tablet, Take 1.5 tablets by mouth every 4 (four) hours as needed. -Try to separate Sinemet from food (especially protein-rich foods like meat, dairy, eggs) by about 30-60 mins - this will help the absorption of the medication. If you have some nausea with the medication, you can take it with some light food like crackers or ginger ale, Disp: 540 tablet, Rfl: 4   donepezil (ARICEPT) 10 MG tablet, Take 1 tablet (10 mg total) by mouth at bedtime., Disp: 30 tablet, Rfl: 5   dronabinol (MARINOL) 2.5 MG capsule, Take 1 capsule (2.5 mg total) by mouth 2 (two) times daily before a meal. Ok to refill on 08/06/2023, Disp: 60 capsule, Rfl: 0   lidocaine-prilocaine (EMLA) cream, Apply to affected area once, Disp: 30 g, Rfl: 3   losartan (COZAAR) 50 MG tablet, Take 1 tablet (50 mg total) by mouth daily., Disp: 90 tablet, Rfl: 1   magic mouthwash (multi-ingredient) oral suspension, Take 5 mLs by mouth 4 (four) times daily., Disp: 240 mL, Rfl: 1   Multiple Vitamin (MULTIVITAMIN WITH MINERALS) TABS  tablet, Take 1 tablet by mouth daily. Women's Multivitamin, Disp: , Rfl:    OVER THE COUNTER MEDICATION, Take 1 tablet by mouth daily as needed (constipation)., Disp: , Rfl:    pantoprazole (PROTONIX) 40 MG tablet, Take 40 mg by mouth daily., Disp: , Rfl:    Polyethyl Glycol-Propyl Glycol (SYSTANE) 0.4-0.3 % SOLN, Place 1 drop into both eyes 3 (three) times daily as needed (dry/irritated eyes.)., Disp: , Rfl:    polyethylene glycol (MIRALAX / GLYCOLAX) 17 g packet, Take 17 g by mouth daily., Disp: , Rfl:    pravastatin (PRAVACHOL) 80 MG tablet, Take 1 tablet (80 mg total) by mouth every evening., Disp: , Rfl:    prochlorperazine (COMPAZINE) 10 MG tablet, Take 1 tablet (10 mg total) by mouth every 6 (six) hours as needed for nausea or vomiting., Disp: 30 tablet, Rfl: 1   promethazine (PHENERGAN) 25 MG tablet, Take 25 mg by mouth every 6 (six) hours as needed for nausea or vomiting., Disp: , Rfl:    triamcinolone (NASACORT) 55 MCG/ACT AERO nasal inhaler, Place 2 sprays into the nose daily. Use at night (Patient taking differently: Place 1 spray into the nose daily. Use at night), Disp: 1 each, Rfl: 12  Current Facility-Administered Medications:    heparin lock flush 100 unit/mL, 500 Units, Intracatheter, Once PRN, Malachy Mood, MD   sodium chloride flush (NS) 0.9 % injection 10 mL, 10 mL, Intracatheter, PRN, Malachy Mood, MD  PHYSICAL EXAM: ECOG FS:0 - Asymptomatic    Vitals:   09/05/23 1232  BP: (!) 147/78  Pulse: 71  Resp: 17  Temp: (!) 97.5 F (36.4 C)  TempSrc: Oral  SpO2: 98%  Weight: 141 lb 8 oz (64.2 kg)   Physical Exam  Vitals and nursing note reviewed.  Constitutional:      Appearance: She is not ill-appearing or toxic-appearing.  HENT:     Head: Normocephalic.     Mouth/Throat:     Mouth: Mucous membranes are moist.  Eyes:     Conjunctiva/sclera: Conjunctivae normal.  Cardiovascular:     Rate and Rhythm: Normal rate and regular rhythm.     Pulses: Normal pulses.     Heart  sounds: Normal heart sounds.  Pulmonary:     Effort: Pulmonary effort is normal.     Breath sounds: Normal breath sounds.  Abdominal:     General: There is no distension.     Palpations: There is no mass.     Tenderness: There is no abdominal tenderness. There is no right CVA tenderness, left CVA tenderness, guarding or rebound.     Hernia: No hernia is present.  Musculoskeletal:     Cervical back: Normal range of motion.  Skin:    General: Skin is warm and dry.  Neurological:     General: No focal deficit present.     Mental Status: She is alert and oriented to person, place, and time.     Comments: Equal grip strength in extremities. Ambulatory with normal gait.        LABORATORY DATA: I have reviewed the data as listed    Latest Ref Rng & Units 09/05/2023   11:57 AM 08/14/2023    8:37 AM 07/29/2023    9:20 AM  CBC  WBC 4.0 - 10.5 K/uL 6.1  5.3  8.5   Hemoglobin 12.0 - 15.0 g/dL 29.5  62.1  30.8   Hematocrit 36.0 - 46.0 % 37.5  36.7  37.8   Platelets 150 - 400 K/uL 385  286  459         Latest Ref Rng & Units 09/05/2023   11:57 AM 08/14/2023    8:37 AM 07/29/2023    9:20 AM  CMP  Glucose 70 - 99 mg/dL 657  846  962   BUN 8 - 23 mg/dL 22  18  28    Creatinine 0.44 - 1.00 mg/dL 9.52  8.41  3.24   Sodium 135 - 145 mmol/L 140  141  140   Potassium 3.5 - 5.1 mmol/L 3.5  3.4  3.9   Chloride 98 - 111 mmol/L 105  105  104   CO2 22 - 32 mmol/L 28  28  29    Calcium 8.9 - 10.3 mg/dL 9.1  9.3  9.3   Total Protein 6.5 - 8.1 g/dL 6.7  6.6  7.0   Total Bilirubin 0.3 - 1.2 mg/dL 0.7  1.0  0.6   Alkaline Phos 38 - 126 U/L 73  88  92   AST 15 - 41 U/L 15  15  13    ALT 0 - 44 U/L <5  <5  <5        RADIOGRAPHIC STUDIES (from last 24 hours if applicable) I have personally reviewed the radiological images as listed and agreed with the findings in the report. No results found.      Visit Diagnosis: 1. Malignant neoplasm of head of pancreas (HCC)   2. Port-A-Cath in place       No orders of the defined types were placed in this encounter.   All questions were answered. The patient knows to call the clinic with any problems, questions or concerns. No barriers to learning was detected.  A total of more than  20 minutes were spent on this encounter with face-to-face time and non-face-to-face time, including preparing to see the patient, ordering tests, counseling the patient and coordination of care as outlined above.    Thank you for allowing me to participate in the care of this patient.    Shanon Ace, PA-C Department of Hematology/Oncology [facility] at [facility] Phone: [phone]  [phone]    09/05/2023 10:17 PM

## 2023-09-06 LAB — CANCER ANTIGEN 19-9: CA 19-9: 20 U/mL (ref 0–35)

## 2023-09-08 ENCOUNTER — Encounter: Payer: Self-pay | Admitting: Physician Assistant

## 2023-09-08 NOTE — Assessment & Plan Note (Signed)
-  stage IB (T2, N0, Mx), diagnosed in April 2024 -Presented to the emergency room on 4/17 with jaundice and dark urine -Baseline bilirubin elevated at 8.6 -CT scan showed pancreatic head lesion with involvement of the distal CBD and associated biliary ductal dilation.  No evidence of lymphadenopathy or metastatic disease to the liver -Had ERCP and stent placement on 4/19 -Underwent EUS by Dr. Meridee Score on 04/08/23 showing 30 x 23 mm pancreatic head lesion without any vascular involvement.  -The final pathology showed pancreatic adenocarcinoma -She has met pancreatobiliary surgeon Dr. Donell Beers, and she declined the surgery.  Patient understands that her cancer is not curable without surgery. -I encouraged her to consider chemotherapy for 3 to 6 months, with consolidation radiation -Patient has decided to start chemotherapy gemcitabine and cisplatin every 2 weeks.  She started on 05/27/23, she has received a total of 4 doses of today. -plan to repeat CT on 8/26 -Her tumor marker CA 19.9 has dropped significantly since she started chemo, she is likely responding to treatment. -She underwent biliary stent exchange and fiducial placement by Dr. Meridee Score on August 08, 2023. She is scheduled to start SBRT on 10/2

## 2023-09-09 ENCOUNTER — Encounter: Payer: Self-pay | Admitting: Hematology

## 2023-09-09 ENCOUNTER — Inpatient Hospital Stay: Payer: Medicare PPO | Admitting: Nutrition

## 2023-09-09 ENCOUNTER — Inpatient Hospital Stay: Payer: Medicare PPO

## 2023-09-09 ENCOUNTER — Inpatient Hospital Stay: Payer: Medicare PPO | Admitting: Hematology

## 2023-09-09 VITALS — BP 144/84 | HR 75 | Temp 98.0°F | Resp 18 | Ht 66.0 in | Wt 140.8 lb

## 2023-09-09 DIAGNOSIS — C250 Malignant neoplasm of head of pancreas: Secondary | ICD-10-CM | POA: Diagnosis not present

## 2023-09-09 DIAGNOSIS — Z5111 Encounter for antineoplastic chemotherapy: Secondary | ICD-10-CM | POA: Diagnosis not present

## 2023-09-09 NOTE — Progress Notes (Signed)
[facility]   [phone] [phone]   Clinic Follow up Note   Patient Care Team: [name], MD as PCP - General (Family Medicine) Malachy Mood, MD as Consulting Physician (Oncology)  Date of Service:  09/09/2023  CHIEF COMPLAINT: f/u of pancreatic cancer  CURRENT THERAPY:  Pending SBRT  Oncology History   Pancreatic cancer [facility]) -stage IB (T2, N0, Mx), diagnosed in April 2024 -Presented to the emergency room on 4/17 with jaundice and dark urine -Baseline bilirubin elevated at 8.6 -CT scan showed pancreatic head lesion with involvement of the distal CBD and associated biliary ductal dilation.  No evidence of lymphadenopathy or metastatic disease to the liver -Had ERCP and stent placement on 4/19 -Underwent EUS by Dr. Meridee Score on 04/08/23 showing 30 x 23 mm pancreatic head lesion without any vascular involvement.  -The final pathology showed pancreatic adenocarcinoma -She has met pancreatobiliary surgeon Dr. Donell Beers, and she declined the surgery.  Patient understands that her cancer is not curable without surgery. -I encouraged her to consider chemotherapy for 3 to 6 months, with consolidation radiation -Patient has decided to start chemotherapy gemcitabine and cisplatin every 2 weeks.  She started on 05/27/23, she has received a total of 4 doses of today. -plan to repeat CT on 8/26 -Her tumor marker CA 19.9 has dropped significantly since she started chemo, she is likely responding to treatment. -She underwent biliary stent exchange and fiducial placement by Dr. Meridee Score on August 08, 2023. She is scheduled to start SBRT on 10/2     Assessment and Plan    Pancreatic Cancer Patient experiencing fatigue, shakiness, and reduced physical endurance, likely secondary to chemotherapy. Tumor marker has decreased to normal range, indicating effective treatment. Patient is scheduled to start radiation therapy. -Postpone radiation therapy by one week  to allow for recovery from chemotherapy side effects. -Continue monitoring with repeat labs and CT scan in three months. -Encourage patient to maintain high protein, high calorie diet and stay active as tolerated.  Hypertension Patient reports fluctuating blood pressure, with a recent reading of 161/100. -Advise patient to monitor blood pressure at home and contact primary care physician if consistently high readings are observed.  General Health Maintenance -Continue physical therapy exercises as tolerated. -Schedule follow-up appointment in six weeks.     Plan -I messaged her radiation oncology NP Revonda Standard to cancer her radiation treatment this week, and start radiation next Monday. -Lab and follow-up in 6 weeks    SUMMARY OF ONCOLOGIC HISTORY: Oncology History Overview Note   Cancer Staging  Pancreatic cancer [facility]) Staging form: Exocrine Pancreas, AJCC 8th Edition - Clinical: Stage IB (cT2, cN0, cM0) - Signed by Heilingoetter, Cassandra L, PA-C on 04/12/2023 Total positive nodes: 0     Pancreatic cancer (HCC)  03/27/2023 Imaging   CT ABDOMEN PELVIS W CONTRAST   IMPRESSION: Low-attenuation mass centered along the pancreatic head with involvement of the distal common duct with the associated biliary duct ductal dilatation and pancreatic atrophy. Changes are worrisome for neoplasm either a pancreatic neoplasm or cholangiocarcinoma. Recommend further evaluation.   No liver metastases. No pathologically enlarged upper abdominal nodes.   Diffuse colonic stool.  Normal appendix.  Few diverticula.   Portions of the pelvis are obscured by the streak artifact from the bilateral hip arthroplasties.   03/29/2023 Imaging   DG ERCP   IMPRESSION: Intraoperative fluoroscopic images show dilated intra or extrahepatic bile ducts with high-grade stenosis at the level of the distal CBD. Biliary tree is decompressed following  placement of common bile duct stent.   04/08/2023 Pathology  Results   CYTOLOGY - NON PAP  CASE: [id number]  PATIENT: Marie Ford  Non-Gynecological Cytology Report   FINAL MICROSCOPIC DIAGNOSIS:  A. PANCREAS, HEAD, FINE NEEDLE ASPIRATION:  - Malignant cells present (see Comment)      04/08/2023 Procedure   EUS by Dr. Meridee Score   EUS impression  - A mass was identified in the pancreatic head/ genu of the pancreas. Cytology results are pending. However, the endosonographic appearance is highly suspicious for adenocarcinoma. This was staged T2 N0 Mx by endosonographic criteria. The staging applies if malignancy is confirmed. Fine needle biopsy performed.  - There was dilation in the common bile duct and in the common hepatic duct which measured up to 8 mm.  - One stent was visualized endosonographically within the common bile duct.  - Hyperechoic material consistent with sludge was visualized endosonographically in the gallbladder.  - Two enlarged lymph nodes were visualized in the celiac region ( level 20) and porta hepatis region. Tissue has not been obtained. However, the endosonographic appearance is suggestive of benign inflammatory change based on the visualization endosonographically.   04/12/2023 Initial Diagnosis   Pancreatic cancer (HCC)   04/12/2023 Cancer Staging   Staging form: Exocrine Pancreas, AJCC 8th Edition - Clinical: Stage IB (cT2, cN0, cM0) - Signed by Heilingoetter, Cassandra L, PA-C on 04/12/2023 Total positive nodes: 0   04/12/2023 Tumor Marker   Patient's tumor was tested for the following markers: CA 19.9. Results of the tumor marker test revealed 512.   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  Report date is 05/03/2023.   The CancerNext-Expanded gene panel offered by [facility] and includes sequencing, rearrangement, and RNA analysis for the following 71 genes:  AIP, ALK, APC, ATM, BAP1, BARD1, BMPR1A, BRCA1, BRCA2, BRIP1, CDC73, CDH1, CDK4, CDKN1B, CDKN2A, CHEK2,  DICER1, FH, FLCN, KIF1B, LZTR1,MAX, MEN1, MET, MLH1, MSH2, MSH6, MUTYH, NF1, NF2, NTHL1, PALB2, PHOX2B, PMS2, POT1, PRKAR1A, PTCH1, PTEN, RAD51C,RAD51D, RB1, RET, SDHA, SDHAF2, SDHB, SDHC, SDHD, SMAD4, SMARCA4, SMARCB1, SMARCE1, STK11, SUFU, TMEM127, TP53,TSC1, TSC2 and VHL (sequencing and deletion/duplication); AXIN2, CTNNA1, EGFR, EGLN1, HOXB13, KIT, MITF, MSH3, PDGFRA, POLD1 and POLE (sequencing only); EPCAM and GREM1 (deletion/duplication only).     05/27/2023 -  Chemotherapy   Patient is on Treatment Plan : PANCREATIC Abraxane D1,8,15 + Gemcitabine D1,8,15 q28d        Discussed the use of AI scribe software for clinical note transcription with the patient, who gave verbal consent to proceed.  History of Present Illness   The patient is a 77 year old woman with a history of pancreatic cancer who presents with ongoing fatigue, shakiness, and decreased activity tolerance. She reports being able to perform light activities of daily living such as making meals and making the bed, but experiences significant fatigue and shakiness. She describes waking up in sweats and feeling shaky most of the time. She estimates her activity level to be about 50% of her normal capacity, noting that even walking short distances is tiring. She has not experienced any falls or loss of balance. She denies fever or chills and reports stable weight. She has been experiencing some nausea, but this has diminished considerably. She is scheduled to start radiation therapy soon.  The patient also reports having an upcoming appointment with an ENT specialist, which she anticipates will be physically demanding due to the travel and duration of the visit. She has been participating in physical  therapy and is able to perform the exercises, but feels exhausted afterwards and experiences muscle cramping the next day. She also mentions having high blood pressure that fluctuates and has been monitoring it at home. She has taken Xanax to  help manage stress and anxiety, which she believes may be contributing to her high blood pressure.         All other systems were reviewed with the patient and are negative.  MEDICAL HISTORY:  Past Medical History:  Diagnosis Date   Chronic kidney disease    stage 3    Chronic sphenoidal sinusitis    Degenerative arthritis of hip    requirde surgery 2009   Depression 2010   Diplopia 2011   in left lateral gaze    Diplopia    " my eyes dont track together, i have corrective prisms in my eye glasses"    Diverticulosis 2006   noted in a colonoscopy   Esophageal reflux 2013   Hypertension    IGT (impaired glucose tolerance) 2011   denies hx of diabetes    Insomnia 2009   Medical history non-contributory    Mixed hyperlipidemia 2000   Osteoarthritis    Osteopenia 2005   Other and unspecified hyperlipidemia 2000   Other specified disease of sebaceous glands    Palpitations 2009   secondary to PSVT   Parkinson disease    PNA (pneumonia) 2014   Psoriasis 2004   PSVT (paroxysmal supraventricular tachycardia) 2009   "report it has been some time since i had that "    Situational stress    Urinary tract infection    thru wed 05/15/23, completed 3d antibiotics   Venous insufficiency since 2017    SURGICAL HISTORY: Past Surgical History:  Procedure Laterality Date   BILIARY BRUSHING  03/29/2023   Procedure: BILIARY BRUSHING;  Surgeon: Hilarie Fredrickson, MD;  Location: [facility] ENDOSCOPY;  Service: Gastroenterology;;   BILIARY DILATION  08/08/2023   Procedure: BILIARY DILATION;  Surgeon: Lemar Lofty., MD;  Location: [facility] ENDOSCOPY;  Service: Gastroenterology;;   BILIARY STENT PLACEMENT  03/29/2023   Procedure: BILIARY STENT PLACEMENT;  Surgeon: Hilarie Fredrickson, MD;  Location: [facility] ENDOSCOPY;  Service: Gastroenterology;;   BILIARY STENT PLACEMENT N/A 08/08/2023   Procedure: BILIARY STENT PLACEMENT;  Surgeon: Lemar Lofty., MD;  Location: [facility] ENDOSCOPY;  Service:  Gastroenterology;  Laterality: N/A;   BIOPSY  04/08/2023   Procedure: BIOPSY;  Surgeon: Meridee Score Netty Starring., MD;  Location: [facility] ENDOSCOPY;  Service: Gastroenterology;;   CATARACT EXTRACTION W/ INTRAOCULAR LENS  IMPLANT, BILATERAL     COLONOSCOPY  01/30/2010   Jarold Motto   ENDOSCOPIC RETROGRADE CHOLANGIOPANCREATOGRAPHY (ERCP) WITH PROPOFOL N/A 08/08/2023   Procedure: ENDOSCOPIC RETROGRADE CHOLANGIOPANCREATOGRAPHY (ERCP) WITH PROPOFOL;  Surgeon: Lemar Lofty., MD;  Location: [facility] ENDOSCOPY;  Service: Gastroenterology;  Laterality: N/A;   ERCP N/A 03/29/2023   Procedure: ENDOSCOPIC RETROGRADE CHOLANGIOPANCREATOGRAPHY (ERCP);  Surgeon: Hilarie Fredrickson, MD;  Location[facility] ENDOSCOPY;  Service: Gastroenterology;  Laterality: N/A;   ESOPHAGOGASTRODUODENOSCOPY N/A 08/08/2023   Procedure: ESOPHAGOGASTRODUODENOSCOPY (EGD);  Surgeon: Lemar Lofty., MD;  Location: [facility] ENDOSCOPY;  Service: Gastroenterology;  Laterality: N/A;   ESOPHAGOGASTRODUODENOSCOPY (EGD) WITH PROPOFOL N/A 04/08/2023   Procedure: ESOPHAGOGASTRODUODENOSCOPY (EGD) WITH PROPOFOL;  Surgeon: Meridee Score Netty Starring., MD;  Location: [facility] ENDOSCOPY;  Service: Gastroenterology;  Laterality: N/A;   ETHMOIDECTOMY Left 11/17/2020   Procedure: LEFT SIDED TOTAL ETHMOIDECTOMY;  Surgeon: Drema Halon, MD;  Location: [facility];  Service: ENT;  Laterality: Left;  EUS N/A 04/08/2023   Procedure: UPPER ENDOSCOPIC ULTRASOUND (EUS) LINEAR;  Surgeon: Lemar Lofty., MD;  Location: [facility] ENDOSCOPY;  Service: Gastroenterology;  Laterality: N/A;   EUS N/A 08/08/2023   Procedure: UPPER ENDOSCOPIC ULTRASOUND (EUS) LINEAR;  Surgeon: Lemar Lofty., MD;  Location: [facility] ENDOSCOPY;  Service: Gastroenterology;  Laterality: N/A;   EYE SURGERY     FIDUCIAL MARKER PLACEMENT  08/08/2023   Procedure: FIDUCIAL MARKER PLACEMENT;  Surgeon: Lemar Lofty., MD;  Location: [facility] ENDOSCOPY;  Service: Gastroenterology;;   FINE  NEEDLE ASPIRATION N/A 04/08/2023   Procedure: FINE NEEDLE ASPIRATION (FNA) LINEAR;  Surgeon: Lemar Lofty., MD;  Location: [facility] ENDOSCOPY;  Service: Gastroenterology;  Laterality: N/A;   HEMORRHOID SURGERY     IR IMAGING GUIDED PORT INSERTION  05/17/2023   JOINT REPLACEMENT     LASIK     left medial and superior rectus muscle recesion for diplopia correction     REMOVAL OF STONES  08/08/2023   Procedure: REMOVAL OF SLUDGE;  Surgeon: Meridee Score Netty Starring., MD;  Location: [facility] ENDOSCOPY;  Service: Gastroenterology;;   SINUS ENDO WITH FUSION Left 11/17/2020   Procedure: SINUS ENDOSCOPY WITH FUSION NAVIGATION;  Surgeon: Drema Halon, MD;  Location: [facility];  Service: ENT;  Laterality: Left;   SPHENOIDECTOMY Left 11/17/2020   Procedure: LEFT SIDED SPHENOIDECTOMY WITH TISSUE REMOVAL;  Surgeon: Drema Halon, MD;  Location: [facility];  Service: ENT;  Laterality: Left;   SPHINCTEROTOMY  03/29/2023   Procedure: SPHINCTEROTOMY;  Surgeon: Hilarie Fredrickson, MD;  Location: [facility] ENDOSCOPY;  Service: Gastroenterology;;   Francine Graven REMOVAL  08/08/2023   Procedure: STENT REMOVAL;  Surgeon: Lemar Lofty., MD;  Location: [facility] ENDOSCOPY;  Service: Gastroenterology;;   surgery of right eye rectus muscles for diplpia correction   2019   TOTAL HIP ARTHROPLASTY Right 2010   TOTAL HIP ARTHROPLASTY Left 08/19/2019   Procedure: TOTAL HIP ARTHROPLASTY ANTERIOR APPROACH;  Surgeon: Ollen Gross, MD;  Location: [facility] ORS;  Service: Orthopedics;  Laterality: Left;    TURBINATE REDUCTION Left 11/17/2020   Procedure: LEFT SIDED TURBINATE REDUCTION;  Surgeon: Drema Halon, MD;  Location:  SURGERY CENTER;  Service: ENT;  Laterality: Left;   URETHRAL SLING     midurethral sling with TVT Exact and Cystoscopy    I have reviewed the social history and family history with the patient and they are unchanged from previous note.  ALLERGIES:  is allergic to  erythromycin, macrobid [nitrofurantoin], keflex [cephalexin], levofloxacin, penicillins, and sulfamethoxazole-trimethoprim.  MEDICATIONS:  Current Outpatient Medications  Medication Sig Dispense Refill   ALPRAZolam (XANAX) 0.5 MG tablet Take 0.25 mg by mouth as needed for anxiety. Takes as needed for sleep     AUVELITY 45-105 MG TBCR Take 1 tablet by mouth every evening.     buPROPion (WELLBUTRIN XL) 150 MG 24 hr tablet Take 150 mg by mouth daily.     Calcium Carbonate Antacid (CALCIUM CARBONATE PO) Take by mouth.     carbidopa-levodopa (SINEMET IR) 25-100 MG tablet Take 1.5 tablets by mouth every 4 (four) hours as needed. -Try to separate Sinemet from food (especially protein-rich foods like meat, dairy, eggs) by about 30-60 mins - this will help the absorption of the medication. If you have some nausea with the medication, you can take it with some light food like crackers or ginger ale 540 tablet 4   donepezil (ARICEPT) 10 MG tablet Take 1 tablet (10 mg total) by mouth at  bedtime. 30 tablet 5   dronabinol (MARINOL) 2.5 MG capsule Take 1 capsule (2.5 mg total) by mouth 2 (two) times daily before a meal. Ok to refill on 08/06/2023 60 capsule 0   lidocaine-prilocaine (EMLA) cream Apply to affected area once 30 g 3   losartan (COZAAR) 50 MG tablet Take 1 tablet (50 mg total) by mouth daily. 90 tablet 1   magic mouthwash (multi-ingredient) oral suspension Take 5 mLs by mouth 4 (four) times daily. 240 mL 1   Multiple Vitamin (MULTIVITAMIN WITH MINERALS) TABS tablet Take 1 tablet by mouth daily. Women's Multivitamin     OVER THE COUNTER MEDICATION Take 1 tablet by mouth daily as needed (constipation).     pantoprazole (PROTONIX) 40 MG tablet Take 40 mg by mouth daily.     Polyethyl Glycol-Propyl Glycol (SYSTANE) 0.4-0.3 % SOLN Place 1 drop into both eyes 3 (three) times daily as needed (dry/irritated eyes.).     polyethylene glycol (MIRALAX / GLYCOLAX) 17 g packet Take 17 g by mouth daily.      pravastatin (PRAVACHOL) 80 MG tablet Take 1 tablet (80 mg total) by mouth every evening.     prochlorperazine (COMPAZINE) 10 MG tablet Take 1 tablet (10 mg total) by mouth every 6 (six) hours as needed for nausea or vomiting. 30 tablet 1   promethazine (PHENERGAN) 25 MG tablet Take 25 mg by mouth every 6 (six) hours as needed for nausea or vomiting.     triamcinolone (NASACORT) 55 MCG/ACT AERO nasal inhaler Place 2 sprays into the nose daily. Use at night (Patient taking differently: Place 1 spray into the nose daily. Use at night) 1 each 12   No current facility-administered medications for this visit.    PHYSICAL EXAMINATION: ECOG PERFORMANCE STATUS: 2 - Symptomatic, <50% confined to bed  Vitals:   09/09/23 1149 09/09/23 1150  BP: (!) 147/81 (!) 144/84  Pulse: 75   Resp: 18   Temp: 98 F (36.7 C)   SpO2: 99%    Wt Readings from Last 3 Encounters:  09/09/23 140 lb 12.8 oz (63.9 kg)  09/05/23 141 lb 8 oz (64.2 kg)  08/22/23 142 lb 8 oz (64.6 kg)     GENERAL:alert, no distress and comfortable SKIN: skin color, texture, turgor are normal, no rashes or significant lesions EYES: normal, Conjunctiva are pink and non-injected, sclera clear NECK: supple, thyroid normal size, non-tender, without nodularity LYMPH:  no palpable lymphadenopathy in the cervical, axillary  LUNGS: clear to auscultation and percussion with normal breathing effort HEART: regular rate & rhythm and no murmurs and no lower extremity edema ABDOMEN:abdomen soft, non-tender and normal bowel sounds Musculoskeletal:no cyanosis of digits and no clubbing  NEURO: alert & oriented x 3 with fluent speech, no focal motor/sensory deficits    LABORATORY DATA:  I have reviewed the data as listed    Latest Ref Rng & Units 09/05/2023   11:57 AM 08/14/2023    8:37 AM 07/29/2023    9:20 AM  CBC  WBC 4.0 - 10.5 K/uL 6.1  5.3  8.5   Hemoglobin 12.0 - 15.0 g/dL 16.1  09.6  04.5   Hematocrit 36.0 - 46.0 % 37.5  36.7  37.8    Platelets 150 - 400 K/uL 385  286  459         Latest Ref Rng & Units 09/05/2023   11:57 AM 08/14/2023    8:37 AM 07/29/2023    9:20 AM  CMP  Glucose 70 - 99 mg/dL  122  119  117   BUN 8 - 23 mg/dL 22  18  28    Creatinine 0.44 - 1.00 mg/dL 4.09  8.11  9.14   Sodium 135 - 145 mmol/L 140  141  140   Potassium 3.5 - 5.1 mmol/L 3.5  3.4  3.9   Chloride 98 - 111 mmol/L 105  105  104   CO2 22 - 32 mmol/L 28  28  29    Calcium 8.9 - 10.3 mg/dL 9.1  9.3  9.3   Total Protein 6.5 - 8.1 g/dL 6.7  6.6  7.0   Total Bilirubin 0.3 - 1.2 mg/dL 0.7  1.0  0.6   Alkaline Phos 38 - 126 U/L 73  88  92   AST 15 - 41 U/L 15  15  13    ALT 0 - 44 U/L <5  <5  <5       RADIOGRAPHIC STUDIES: I have personally reviewed the radiological images as listed and agreed with the findings in the report. No results found.    No orders of the defined types were placed in this encounter.  All questions were answered. The patient knows to call the clinic with any problems, questions or concerns. No barriers to learning was detected. The total time spent in the appointment was 25 minutes.     Malachy Mood, MD 09/09/2023

## 2023-09-09 NOTE — Progress Notes (Signed)
Nutrition follow-up completed with patient after MD appointment.  Patient has discontinued chemotherapy.  She is scheduled for SBRT beginning October 7.  Weight documented as 140 pounds 12.8 ounces September 30.  This is decreased from 148 pounds 4.8 ounces July 15.  This is a 5% weight loss over 3 months.  Patient continues to have some nausea which seems better tolerated with Compazine and Marinol.  She reports weakness.  States her stomach has had burning sensations.  She still has some constipation.  Taste alterations continue.  She has not been using baking soda and salt water gargles as frequently as recommended but is willing to increase.  She eats less meat now than before but still enjoys shrimp.  Reports some difficulty swallowing larger medications.  Nutrition diagnosis: Inadequate oral intake, ongoing.  Intervention: Enforced importance of using baking soda and salt water gargles before eating consistently. Reviewed options for probiotics.  She may try Gummies. Tried to introduce some variety into her diet.  Suggested making smoothies using Ensure Plus.  Provided samples and coupons. Consider Enterade twice daily to help with gut function.  Provided samples. Reviewed suggestions for improving nausea.  Monitoring, evaluation, goals: Patient will tolerate increased calorie and protein to minimize weight loss.  Next visit: To be scheduled as needed.  Patient has RD contact information.  **Disclaimer: This note was dictated with voice recognition software. Similar sounding words can inadvertently be transcribed and this note may contain transcription errors which may not have been corrected upon publication of note.**

## 2023-09-10 ENCOUNTER — Ambulatory Visit: Payer: Medicare PPO

## 2023-09-10 ENCOUNTER — Other Ambulatory Visit: Payer: Medicare PPO

## 2023-09-10 ENCOUNTER — Ambulatory Visit: Payer: Medicare PPO | Admitting: Radiation Oncology

## 2023-09-10 ENCOUNTER — Encounter: Payer: Medicare PPO | Admitting: Dietician

## 2023-09-10 ENCOUNTER — Ambulatory Visit: Payer: Medicare PPO | Admitting: Hematology

## 2023-09-10 ENCOUNTER — Ambulatory Visit
Admission: RE | Admit: 2023-09-10 | Discharge: 2023-09-10 | Disposition: A | Discharge status: Home / Self Care | Payer: Medicare PPO | Source: Ambulatory Visit | Attending: Radiation Oncology | Admitting: Radiation Oncology

## 2023-09-10 ENCOUNTER — Other Ambulatory Visit: Payer: Self-pay

## 2023-09-10 DIAGNOSIS — C250 Malignant neoplasm of head of pancreas: Secondary | ICD-10-CM | POA: Insufficient documentation

## 2023-09-11 ENCOUNTER — Ambulatory Visit: Payer: Medicare PPO | Admitting: Radiation Oncology

## 2023-09-12 ENCOUNTER — Ambulatory Visit: Payer: Medicare PPO | Admitting: Radiation Oncology

## 2023-09-12 ENCOUNTER — Encounter: Payer: Self-pay | Admitting: Neurology

## 2023-09-13 ENCOUNTER — Ambulatory Visit: Payer: Medicare PPO | Admitting: Radiation Oncology

## 2023-09-16 ENCOUNTER — Other Ambulatory Visit: Payer: Self-pay

## 2023-09-16 DIAGNOSIS — C250 Malignant neoplasm of head of pancreas: Secondary | ICD-10-CM | POA: Diagnosis not present

## 2023-09-16 LAB — RAD ONC ARIA SESSION SUMMARY
Course Elapsed Days: 0
Plan Fractions Treated to Date: 1
Plan Prescribed Dose Per Fraction: 6.6 Gy
Plan Total Fractions Prescribed: 5
Plan Total Prescribed Dose: 33 Gy
Reference Point Dosage Given to Date: 6.6 Gy
Reference Point Session Dosage Given: 6.6 Gy
Session Number: 1

## 2023-09-18 ENCOUNTER — Ambulatory Visit
Admission: RE | Admit: 2023-09-18 | Discharge: 2023-09-18 | Disposition: A | Discharge status: Home / Self Care | Payer: Medicare PPO | Source: Ambulatory Visit | Attending: Radiation Oncology | Admitting: Radiation Oncology

## 2023-09-18 ENCOUNTER — Other Ambulatory Visit: Payer: Self-pay

## 2023-09-18 DIAGNOSIS — C250 Malignant neoplasm of head of pancreas: Secondary | ICD-10-CM | POA: Diagnosis not present

## 2023-09-18 LAB — RAD ONC ARIA SESSION SUMMARY
Course Elapsed Days: 2
Plan Fractions Treated to Date: 2
Plan Prescribed Dose Per Fraction: 6.6 Gy
Plan Total Fractions Prescribed: 5
Plan Total Prescribed Dose: 33 Gy
Reference Point Dosage Given to Date: 13.2 Gy
Reference Point Session Dosage Given: 6.6 Gy
Session Number: 2

## 2023-09-19 ENCOUNTER — Ambulatory Visit: Payer: Medicare PPO | Admitting: Psychology

## 2023-09-20 ENCOUNTER — Ambulatory Visit
Admission: RE | Admit: 2023-09-20 | Discharge: 2023-09-20 | Disposition: A | Discharge status: Home / Self Care | Payer: Medicare PPO | Source: Ambulatory Visit | Attending: Radiation Oncology | Admitting: Radiation Oncology

## 2023-09-20 ENCOUNTER — Other Ambulatory Visit: Payer: Self-pay

## 2023-09-20 DIAGNOSIS — C250 Malignant neoplasm of head of pancreas: Secondary | ICD-10-CM | POA: Diagnosis not present

## 2023-09-20 LAB — RAD ONC ARIA SESSION SUMMARY
Course Elapsed Days: 4
Plan Fractions Treated to Date: 3
Plan Prescribed Dose Per Fraction: 6.6 Gy
Plan Total Fractions Prescribed: 5
Plan Total Prescribed Dose: 33 Gy
Reference Point Dosage Given to Date: 19.8 Gy
Reference Point Session Dosage Given: 6.6 Gy
Session Number: 3

## 2023-09-24 ENCOUNTER — Other Ambulatory Visit: Payer: Self-pay

## 2023-09-24 ENCOUNTER — Ambulatory Visit
Admission: RE | Admit: 2023-09-24 | Discharge: 2023-09-24 | Disposition: A | Discharge status: Home / Self Care | Payer: Medicare PPO | Source: Ambulatory Visit | Attending: Radiation Oncology | Admitting: Radiation Oncology

## 2023-09-24 ENCOUNTER — Ambulatory Visit: Payer: Medicare PPO | Admitting: Radiation Oncology

## 2023-09-24 DIAGNOSIS — C250 Malignant neoplasm of head of pancreas: Secondary | ICD-10-CM | POA: Diagnosis not present

## 2023-09-24 LAB — RAD ONC ARIA SESSION SUMMARY
Course Elapsed Days: 8
Plan Fractions Treated to Date: 4
Plan Prescribed Dose Per Fraction: 6.6 Gy
Plan Total Fractions Prescribed: 5
Plan Total Prescribed Dose: 33 Gy
Reference Point Dosage Given to Date: 26.4 Gy
Reference Point Session Dosage Given: 6.6 Gy
Session Number: 4

## 2023-09-26 ENCOUNTER — Ambulatory Visit
Admission: RE | Admit: 2023-09-26 | Discharge: 2023-09-26 | Disposition: A | Discharge status: Home / Self Care | Payer: Medicare PPO | Source: Ambulatory Visit | Attending: Radiation Oncology | Admitting: Radiation Oncology

## 2023-09-26 ENCOUNTER — Other Ambulatory Visit: Payer: Self-pay

## 2023-09-26 DIAGNOSIS — C250 Malignant neoplasm of head of pancreas: Secondary | ICD-10-CM | POA: Diagnosis not present

## 2023-09-26 LAB — RAD ONC ARIA SESSION SUMMARY
Course Elapsed Days: 10
Plan Fractions Treated to Date: 5
Plan Prescribed Dose Per Fraction: 6.6 Gy
Plan Total Fractions Prescribed: 5
Plan Total Prescribed Dose: 33 Gy
Reference Point Dosage Given to Date: 33 Gy
Reference Point Session Dosage Given: 6.6 Gy
Session Number: 5

## 2023-09-27 ENCOUNTER — Other Ambulatory Visit: Payer: Self-pay

## 2023-09-28 NOTE — Radiation Completion Notes (Signed)
Radiation Oncology         [phone]) [phone] ________________________________  Name: Marie Ford MRN: [id number]  Date of Service: 09/26/2023  DOB: 26-Feb-1946  End of Treatment Note   Diagnosis: Stage IB, cT2N0M0, adenocarcinoma of the head of pancreas   Intent: Curative     ==========DELIVERED PLANS==========  First Treatment Date: 2023-09-16 - Last Treatment Date: 2023-09-26   Plan Name: Pancreas_SBRT Site: Pancreas Technique: SBRT/SRT-IMRT Mode: Photon Dose Per Fraction: 6.6 Gy Prescribed Dose (Delivered / Prescribed): 33 Gy / 33 Gy Prescribed Fxs (Delivered / Prescribed): 5 / 5     ==========ON TREATMENT VISIT DATES========== 2023-09-16, 2023-09-18, 2023-09-20, 2023-09-20, 2023-09-24, 2023-09-26    See weekly On Treatment Notes in Epic for details. The patient tolerated radiation.    The patient will receive a call in about one month from the radiation oncology department. She will continue follow up with Dr. [name] as well.      Osker Mason, PAC

## 2023-09-30 ENCOUNTER — Ambulatory Visit: Payer: Medicare PPO | Admitting: Neurology

## 2023-09-30 ENCOUNTER — Telehealth: Payer: Self-pay | Admitting: Neurology

## 2023-09-30 NOTE — Telephone Encounter (Signed)
Mychart r/s message

## 2023-10-02 ENCOUNTER — Ambulatory Visit: Payer: Medicare PPO | Admitting: Neurology

## 2023-10-02 ENCOUNTER — Encounter: Payer: Self-pay | Admitting: Neurology

## 2023-10-02 VITALS — BP 135/83 | HR 78 | Ht 66.0 in | Wt 139.0 lb

## 2023-10-02 DIAGNOSIS — G20A2 Parkinson's disease without dyskinesia, with fluctuations: Secondary | ICD-10-CM

## 2023-10-02 MED ORDER — CARBIDOPA-LEVODOPA 25-100 MG PO TABS: 1.5000 | ORAL_TABLET | Freq: Four times a day (QID) | ORAL | 4 refills | Status: AC

## 2023-10-02 MED ORDER — CARBIDOPA-LEVODOPA ER 50-200 MG PO TBCR
1.0000 | EXTENDED_RELEASE_TABLET | Freq: Every day | ORAL | 3 refills | Status: AC
Start: 2023-10-02 — End: ?

## 2023-10-02 NOTE — Progress Notes (Signed)
[name]    Provider:  Dr Lucia Ford Requesting Provider: [name], MD Primary Care Provider:  [name], MD  CC:  Headache, memory loss, gait abnormality   10/02/2023: She is feeling better than she did yesterday. Last Thursday was her last treatment for pancreatic cancer. She gets shaky, she has cramping, weak legs and arms after eating, her handwriting, Somtime sint he morning when she wake sup. Every morning. I don;t see any shaking today. Asked her to discuss nausea and problems after eating. Had chemo and radiation. Discussed getting repeat with Marie. Fransisca Ford but not at this time feel tere would be confounding results based on recent chemo and radiation jst recently completed and she does not wish to have anynmore treatment. She is shaky in the morning.Parkinsons can cause small fiber autnomic symptoms like nausea but at this time I think it is likely from the treatments and cancer will follow up in 4-5 months  Ford complains of symptoms per HPI as well as the following symptoms: nausea, decreaed appetite . Pertinent negatives and positives per HPI. All others negative   05/30/2023: recent diagnosis of pancreatic cancer and not feeling well, had first chemo, feels cognition is worsening. She takes friend is here and provides much information.1.5 tabs 4x a day, 7am,11pm,3pm,7pm. She goes to bed between 8-930pm. We increaed dose because the tremors were worsening since increasing dose its helped. She has lost weight over last 1.5 years. Here with Marie Ford who is moving into [place] like the Ford. Friend thinks her cognition has really declined and sometimes she will ask bizarre questions but had a recent UTI finished treatment, friends says she has a poor appetite and Ford said 3 weeks but its been 3 months or more, forgetting things. First chemo was this past Monday. She is more tired and tired a great deal and naps every day. She forgets words. She  sleeps in until 9-10 every day. She oresented in April with jaundice and found Obstructive jaundice due to pancreatic mass concerning for malignancy: Reports normal colo over 10 years ago also Hyperbilirubinemia/elevated liver enzymes. She has word-finding problems, cognitive impairment.   Ford complains of symptoms per HPI as well as the following symptoms: fatigue . Pertinent negatives and positives per HPI. All others negative   10/17/2022: Ford states she wears off about 1/2-3/4 hr before her next dose is due. Also she feels her cognition, decision making, word finding, forgetfullness, fatigue is worsening. We will increase sinemet to every 4 hours. She has started TMS and feels it is helping her mood. She hasn't been crying as much and she is feeling better. Ford requests follow up neurocognitive testing with Marie Ford, she is willing to wait 8 months to a year, she had testing in 2022 and feels cognition is worsening, she has parkinson's disease.  Ford complains of symptoms per HPI as well as the following symptoms: cognitive decline . Pertinent negatives and positives per HPI. All others negative   04/11/2022: she moved back to [place] permanently, her dog passed away and this hurt her heart. She is moving into [employer]. She gets very tired easily, she has fatigue even if she had a good night's sleep, she has dry mouth in the morning, do not feel refreshed but also don't fall asleep during the day or nap so unlikely. BP is great.  She had a comprehensive examination 2 weeks ago annual and had blood work completed. She thinks stress is a lot of it and she had a  sinus surgery and she is healing. She exercises 20 minutes a day. No falls. She went to physical therapy and she is doing all the exercises. She walks. She feels better after resting. Tremors return after 4-5 hours and that is when she takes a sinemet 730, 1230, 530-6.  The medicine might make her a little tired. She is also  on alprazolam at night and takes it for stress during the day and that may make her tired as well. She has chronic diplopia and has prisms in her glasses continuous had 2 surgeries since 2011  she corrected both sides at [facility] for strabismus last surgery 2019. She had an EKG with Marie Ford and it was good per Ford. Here with friend. She is not as alert or as detailed stressful house move. She has a lot of anxiety recently.   Ford complains of symptoms per HPI as well as the following symptoms: stress . Pertinent negatives and positives per HPI. All others negative   HPI 01/16/2021: Marie Ford  Marie Ford is a 77 y.o. female here as requested by Marie Putt, MD for "headaches, ataxia and cognitive impairment".  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  I reviewed Marie Ford notes: Over the last year she has had chronic symptoms of headaches, CT scan of the sinuses in April showed sphenoid sinusitis, she had similar findings the year before, she saw Marie. Ezzard Ford in ENT in April of this year and he did not feel the sinusitis required surgery was necessarily the cause of her headaches.  Associated with her headaches have been increasing symptoms of anxiety and depression, much of this related to stress, recently improved on fluoxetine, however the headaches fatigue and nausea have continued.  She also complains of ataxia over the last few months a few times a week as well as cognitive impairment short-term memory deficits and she is  interested in more formal neuropsychological testing.  MRI of the brain in October was unremarkable but also showed the sphenoid sinusitis.   She is here with her friend of 35 years, headaches are improved since surgery and antibiotic. Over the last 2 years she has had cognitive decline and physicial decline and lots of stress, hip replace,ent, 18-year relationship ended, she had to sell a house sh eloved and some communication problems with daughter, headaches are not so much the issue anymore. She has tremors under stress and feels "jittery". The left side doesn't work as well on the left side, she has imbalance on the left side, grip is weakness on the left side, hand writing has changed has gotten smaller. She feels clumsy and slow, her friend provides much information and says she is having difficulty with fly fishing and she is a well known for her fly fishing, her gait is different and she is weak, chronic fatigue, smell and taste is pretty much intact but not great she can't tell the difference between teas, some of the scent is gone, swallowing is ok, tremor is resting and with action, worse when under stress, left is worse. Friend has noticed her memory is declining more than she thinks is  normal, she is not sleeping well at all, trouble falling asleep. She tries to meditate, she may try to eat something, she takes Xanax sometimes. Her gait is different, her left side is affected, hard to move that side and she doesn't swing her left arm. She walks her dog every day. Difficulty with the left hand with coordination, no falls, feels like she is shuffling, not lightheaded, she feels unbalanced, difficulty tieing the flys for fly fishing. Father died of stroke, no parkinson's disease. She is having difficulty remembering to do things, she loses steps, she has compensation techniques and uses her calendars, not getting lost, no hallucinations or delusions, no acting out dreams, but she is having vivid  dreams, more dreaming, much more emotional and depressed but not overreacting. Friend states he looks sadder more flat in affect.   Reviewed notes, labs and imaging from outside physicians, which showed:   October 07, 2020: CMP with BUN 24, creatinine 1.12, glucose 101, otherwise unremarkable, TSH 2.59, sed rate 17 normal, CBC normal.  CT Sinuses 11/10/2020: reviewed report and personally reviewed images (also reviewed with Ford) FINDINGS: Paranasal sinuses:   Frontal: Aplastic on the left and hypoplastic on the right   Ethmoid: Frothy secretions in the posterior left ethmoid air cell.   Maxillary: Small presumed retention cysts on the right more than left.   Sphenoid: Chronic left sphenoid sinusitis with complete opacification and sclerotic wall thickening. Sclerotic wall thickening has progressed and the ostium appears covered by bone currently.   Right ostiomeatal unit: Patent.   Left ostiomeatal unit: Patent.   Nasal passages: Patent. Intact nasal septum is essentially midline.   Anatomy: No pneumatization superior to anterior ethmoid notches. Sellar sphenoid pneumatization pattern. No dehiscence of carotid or optic canals. No onodi cell.   Other: No incidental finding on soft tissue windows.   IMPRESSION: 1. Chronic left sphenoid sinusitis with progressive sclerotic wall thickening since May 2021. The ostium now appears covered by calcification. 2. Continued frothy secretions in the left posterior ethmoid air Cell.  MRI brain 09/30/2020: Mild  chronic microvascular ischemic changes, typical for age.(personally reviewed and agree with findings) reviewed report and personally reviewed images (also reviewed with Ford)  10/05/2021: Here for follow up. Was seen at [facility] by a movement disorder specialist, her recommendations were to continue Sinemet, we reviewed the notes and the appointment at [facility] together with her good friend today, we reviewed resources  in the community and I provided her with documentation on Parkinson's groups, Parkinson's specific exercise groups, Ford, also Ford feels she is depressed and has anxiety, would discuss with primary care Marie Ford, per Ford she has been tried on multiple medications, we did discuss at this point referring her to PA at [facility] that can help her with medication management and also refer her to a therapist. I can refer her for physical therapy for Parkinson's disease specific PT. Headaches resolved after sphenoidectomy.   Interval history 05/03/2021: DAT scan consistent with parkinsonian disorder. We discussed today. I recommend Ford be transitioned to [facility] Movements Disorder Team. They are the leaders in parkinson's Disease in this area.   She has tremor in her left hand at rest, worse with stress, she has some cognitive impairments and weak, she feels she can;t find the words and can't understand directions. We discussed speech therapy, they are concerned about driving and he has a weakness in visual memory, we discussed a driving test, discussed regular skin tests with a dermatologist.  DAT Scan 02/09/2021: Marked decreased activity within the LEFT and RIGHT striatum. Slightly greater deficit on the RIGHT. This pattern can be found in Parkinsonian syndromes pathology.  Review of Systems: Ford complains of symptoms per HPI as well as the following symptoms: anxiety . Pertinent negatives and positives per HPI. All others negative .   Social History   Socioeconomic History   Marital status: Single    Spouse name: Not on file   Number of children: 1   Years of education: Not on file   Highest education level: Doctorate  Occupational History   Occupation: retired  Tobacco Use   Smoking status: Never   Smokeless tobacco: Never  Vaping Use   Vaping status: Never Used  Substance and Sexual Activity   Alcohol use: Yes    Comment: rarely   Drug use: No    Sexual activity: Not Currently    Birth control/protection: Post-menopausal  Other Topics Concern   Not on file  Social History Narrative   Lives at [facility]   Right handed   Caffeine: rarely   Social Determinants of Health   Financial Resource Strain: Not on file  Food Insecurity: No Food Insecurity (08/21/2023)   Hunger Vital Sign    Worried About Running Out of Food in the Last Year: Never true    Ran Out of Food in the Last Year: Never true  Transportation Needs: No Transportation Needs (08/21/2023)   PRAPARE - Administrator, Civil Service (Medical): No    Lack of Transportation (Non-Medical): No  Physical Activity: Not on file  Stress: Not on file  Social Connections: Unknown (04/20/2022)   [facility] from [facility]   Social Network    Social Network: Not on file  Intimate Partner Violence: Not At Risk (08/21/2023)   Humiliation, Afraid, Rape, and Kick questionnaire    Fear of Current or Ex-Partner: No    Emotionally Abused: No    Physically Abused: No    Sexually Abused: No    Family History  Problem Relation Age of Onset   Congestive Heart Failure Mother    CVA Father    Heart failure Father    Non-Hodgkin's lymphoma Sister    Alcohol abuse Sister    Liver cancer Brother    Colon cancer Brother 72   Colitis Brother    Hypertension Brother    Gout Brother    Melanoma Daughter    Parkinson's disease Cousin    Esophageal cancer Neg Hx    Rectal cancer Neg Hx    Stomach cancer Neg Hx    Colon polyps Neg Hx     Past Medical History:  Diagnosis Date   Chronic kidney disease    stage 3    Chronic sphenoidal sinusitis    Degenerative arthritis of hip    requirde surgery 2009   Depression 2010   Diplopia 2011   in left lateral gaze    Diplopia    " my eyes dont track together, i have corrective prisms in my eye glasses"    Diverticulosis 2006   noted in a colonoscopy   Esophageal reflux 2013    Hypertension    IGT (impaired glucose tolerance) 2011   denies hx of diabetes    Insomnia 2009   Medical history non-contributory    Mixed hyperlipidemia 2000   Osteoarthritis    Osteopenia 2005   Other and unspecified hyperlipidemia 2000   Other specified disease of sebaceous glands  Palpitations 2009   secondary to PSVT   Parkinson disease (HCC)    PNA (pneumonia) 2014   Psoriasis 2004   PSVT (paroxysmal supraventricular tachycardia) (HCC) 2009   "report it has been some time since i had that "    Situational stress    Urinary tract infection    thru wed 05/15/23, completed 3d antibiotics   Venous insufficiency since 2017    Ford Active Problem List   Diagnosis Date Noted   Eustachian tube dysfunction, bilateral 08/22/2023   Encounter for antineoplastic chemotherapy 06/11/2023   Port-A-Cath in place 05/27/2023   Genetic testing 04/29/2023   Pancreatic cancer (HCC) 04/12/2023   Acute hyponatremia 03/28/2023   Allergic rhinitis 03/28/2023   Obstructive jaundice 03/28/2023   Pancreatic mass 03/28/2023   Anxiety 10/08/2021   Depression 10/08/2021   Parkinson disease (HCC) 10/04/2021   OA (osteoarthritis) of hip 08/19/2019   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   Diverticular disease 07/10/2017   Gastroesophageal reflux disease 07/10/2017   Osteopenia 07/10/2017   Insomnia 06/03/2016   Essential hypertension 03/26/2014   Hyperlipidemia 03/01/2014   Other specified postprocedural states 03/01/2014   Exertional dyspnea 02/07/2014   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 ENDOSCOPY;  Service: Gastroenterology;;   BILIARY STENT PLACEMENT N/A 08/08/2023   Procedure: BILIARY STENT PLACEMENT;  Surgeon: Lemar Lofty., MD;  Location: [facility] ENDOSCOPY;  Service: Gastroenterology;  Laterality: N/A;   BIOPSY  04/08/2023   Procedure: BIOPSY;  Surgeon: Meridee Score Netty Starring., MD;  Location: [facility] ENDOSCOPY;  Service: Gastroenterology;;   CATARACT EXTRACTION W/ INTRAOCULAR LENS  IMPLANT, BILATERAL     COLONOSCOPY  01/30/2010   Jarold Motto   ENDOSCOPIC RETROGRADE CHOLANGIOPANCREATOGRAPHY (ERCP) WITH PROPOFOL N/A 08/08/2023   Procedure: ENDOSCOPIC RETROGRADE CHOLANGIOPANCREATOGRAPHY (ERCP) WITH PROPOFOL;  Surgeon: Lemar Lofty., MD;  Location: [facility] ENDOSCOPY;  Service: Gastroenterology;  Laterality: N/A;   ERCP N/A 03/29/2023   Procedure: ENDOSCOPIC RETROGRADE CHOLANGIOPANCREATOGRAPHY (ERCP);  Surgeon: Hilarie Fredrickson, MD;  Location: [facility] ENDOSCOPY;  Service: Gastroenterology;  Laterality: N/A;   ESOPHAGOGASTRODUODENOSCOPY N/A 08/08/2023   Procedure: ESOPHAGOGASTRODUODENOSCOPY (EGD);  Surgeon: Lemar Lofty., MD;  Location: [facility] ENDOSCOPY;  Service: Gastroenterology;  Laterality: N/A;   ESOPHAGOGASTRODUODENOSCOPY (EGD) WITH PROPOFOL N/A 04/08/2023   Procedure: ESOPHAGOGASTRODUODENOSCOPY (EGD) WITH PROPOFOL;  Surgeon: Meridee Score Netty Starring., MD;  Location: [facility] ENDOSCOPY;  Service: Gastroenterology;  Laterality: N/A;   ETHMOIDECTOMY Left 11/17/2020   Procedure: LEFT SIDED TOTAL ETHMOIDECTOMY;  Surgeon: Drema Halon, MD;  Location: [facility];  Service: ENT;  Laterality: Left;   EUS N/A 04/08/2023   Procedure: UPPER ENDOSCOPIC ULTRASOUND (EUS) LINEAR;  Surgeon: Lemar Lofty., MD;  Location: [facility] ENDOSCOPY;  Service: Gastroenterology;  Laterality: N/A;   EUS N/A 08/08/2023   Procedure: UPPER ENDOSCOPIC  ULTRASOUND (EUS) LINEAR;  Surgeon: Lemar Lofty., MD;  Location: [facility] ENDOSCOPY;  Service: Gastroenterology;  Laterality: N/A;   EYE SURGERY     FIDUCIAL MARKER PLACEMENT  08/08/2023   Procedure: FIDUCIAL MARKER PLACEMENT;  Surgeon: Lemar Lofty., MD;  Location: [facility] ENDOSCOPY;  Service: Gastroenterology;;   FINE NEEDLE ASPIRATION N/A 04/08/2023   Procedure: FINE NEEDLE ASPIRATION (FNA) LINEAR;  Surgeon: Lemar Lofty., MD;  Location: [facility] ENDOSCOPY;  Service: Gastroenterology;  Laterality: N/A;   HEMORRHOID SURGERY     IR IMAGING GUIDED PORT INSERTION  05/17/2023   JOINT REPLACEMENT     LASIK     left medial and superior rectus muscle recesion for diplopia correction     REMOVAL OF STONES  08/08/2023   Procedure: REMOVAL OF SLUDGE;  Surgeon: Meridee Score Netty Starring., MD;  Location: [facility] ENDOSCOPY;  Service: Gastroenterology;;   SINUS ENDO WITH FUSION Left 11/17/2020   Procedure: SINUS ENDOSCOPY WITH FUSION NAVIGATION;  Surgeon: Drema Halon, MD;  Location: [facility];  Service: ENT;  Laterality: Left;   SPHENOIDECTOMY Left 11/17/2020   Procedure: LEFT SIDED SPHENOIDECTOMY WITH TISSUE REMOVAL;  Surgeon: Drema Halon, MD;  Location: [facility];  Service: ENT;  Laterality: Left;   SPHINCTEROTOMY  03/29/2023   Procedure: SPHINCTEROTOMY;  Surgeon: Hilarie Fredrickson, MD;  Location: [facility] ENDOSCOPY;  Service: Gastroenterology;;   Francine Graven REMOVAL  08/08/2023   Procedure: STENT REMOVAL;  Surgeon: Lemar Lofty., MD;  Location: [facility] ENDOSCOPY;  Service: Gastroenterology;;   surgery of right eye rectus muscles for diplpia correction   2019   TOTAL HIP ARTHROPLASTY Right 2010   TOTAL HIP ARTHROPLASTY Left 08/19/2019   Procedure: TOTAL HIP ARTHROPLASTY ANTERIOR APPROACH;  Surgeon: Ollen Gross, MD;  Location: [facility] ORS;  Service: Orthopedics;  Laterality: Left;    TURBINATE REDUCTION Left 11/17/2020   Procedure: LEFT SIDED TURBINATE REDUCTION;   Surgeon: Drema Halon, MD;  Location: [facility];  Service: ENT;  Laterality: Left;   URETHRAL SLING     midurethral sling with TVT Exact and Cystoscopy    Current Outpatient Medications  Medication Sig Dispense Refill   ALPRAZolam (XANAX) 0.5 MG tablet Take 0.25 mg by mouth as needed for anxiety. Takes as needed for sleep     AUVELITY 45-105 MG TBCR Take 1 tablet by mouth every evening.     buPROPion (WELLBUTRIN XL) 150 MG 24 hr tablet Take 150 mg by mouth daily.     Calcium Carbonate Antacid (CALCIUM CARBONATE PO) Take by mouth.     carbidopa-levodopa (SINEMET CR) 50-200 MG tablet Take 1 tablet by mouth at bedtime. 90 tablet 3   donepezil (ARICEPT) 10 MG tablet Take 1 tablet (10 mg total) by mouth at bedtime. 30 tablet 5   dronabinol (MARINOL) 2.5 MG capsule Take 1 capsule (2.5 mg total) by mouth 2 (two) times daily before a meal. Ok to refill on 08/06/2023 60 capsule 0   lidocaine-prilocaine (EMLA) cream Apply to affected area once 30 g 3   magic mouthwash (multi-ingredient) oral suspension Take 5 mLs by mouth 4 (four) times daily. 240 mL 1   Multiple Vitamin (MULTIVITAMIN WITH MINERALS) TABS tablet Take 1 tablet by mouth daily. Women's Multivitamin     OVER THE COUNTER MEDICATION Take 1 tablet by mouth daily as needed (constipation).     pantoprazole (PROTONIX) 40 MG tablet Take 40 mg by mouth daily.     Polyethyl Glycol-Propyl Glycol (SYSTANE) 0.4-0.3 % SOLN Place 1 drop into both eyes 3 (three) times daily as needed (dry/irritated eyes.).     polyethylene glycol (MIRALAX / GLYCOLAX) 17 g packet Take 17 g by mouth daily.     pravastatin (PRAVACHOL) 80  MG tablet Take 1 tablet (80 mg total) by mouth every evening.     prochlorperazine (COMPAZINE) 10 MG tablet Take 1 tablet (10 mg total) by mouth every 6 (six) hours as needed for nausea or vomiting. 30 tablet 1   promethazine (PHENERGAN) 25 MG tablet Take 25 mg by mouth every 6 (six) hours as needed for nausea or  vomiting.     triamcinolone (NASACORT) 55 MCG/ACT AERO nasal inhaler Place 2 sprays into the nose daily. Use at night (Ford taking differently: Place 1 spray into the nose daily. Use at night) 1 each 12   carbidopa-levodopa (SINEMET IR) 25-100 MG tablet Take 1.5 tablets by mouth 4 (four) times daily. -Try to separate Sinemet from food (especially protein-rich foods like meat, dairy, eggs) by about 30-60 mins - this will help the absorption of the medication. If you have some nausea with the medication, you can take it with some light food like crackers or ginger ale. Try taking at 7am, 10am, 1pm and 4pm and then at bedtime take the extended release(Sinemet CR) 540 tablet 4   losartan (COZAAR) 50 MG tablet Take 1 tablet (50 mg total) by mouth daily. 90 tablet 1   No current facility-administered medications for this visit.    Allergies as of 10/02/2023 - Review Complete 10/02/2023  Allergen Reaction Noted   Erythromycin Shortness Of Breath and Rash    Macrobid [nitrofurantoin] Other (See Comments) 12/23/2017   Keflex [cephalexin] Nausea And Vomiting 03/27/2023   Levofloxacin Nausea Only 10/04/2021   Penicillins Rash    Sulfamethoxazole-trimethoprim Rash 10/19/2020    Vitals: BP 135/83   Pulse 78   Ht 5\' 6"  (1.676 m)   Wt 139 lb (63 kg)   BMI 22.44 kg/m  Last Weight:  Wt Readings from Last 1 Encounters:  10/02/23 139 lb (63 kg)   Last Height:   Ht Readings from Last 1 Encounters:  10/02/23 5\' 6"  (1.676 m)    Exam: NAD, pleasant                  Speech:    Speech is normal; fluent and spontaneous with normal comprehension.  Cognition:    The Ford is oriented to person, place, and time;     recent and remote memory intact;     language fluent;    Cranial Nerves:    The pupils are equal, round, and reactive to light.Trigeminal sensation is intact and the muscles of mastication are normal. The face is symmetric. The palate elevates in the midline. Hearing intact. Voice is  normal. Shoulder shrug is normal. The tongue has normal motion without fasciculations.   Coordination:  No dysmetria  Motor Observation: Minimal tremor postural and end pointing. No resting tremor.  Tone: No significant cogwheeling    Strength:    Strength is V/V in the upper and lower limbs.      Sensation: intact to LT  Gait: no significant shuffling   :Assessment/Plan:   Marie 77 y.o. female here as requested by Marie Putt, MD for follow up of Parkinson's Disease. Recent diagnosis of pancreatic cancer on chemo, declining physically. 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  She is having a lot of nausea, decreased appetite, just completed treatment for pancreatic cancer. Although Parkinsons can cause small fiber autnomic symptoms like nausea but at this time I think it is likely from the treatments and cancer will follow up in 4-5 months.  Add Sinemet CR overnight  -increased dose of sinemet to 1.5 4x a day has helped. But she is shaky in the morning and overnight. Add Sinemet CR at bedtime -  worsening cognition, decision making, word finding, forgetfullness, fatigue is worsening. Aphasia. Recent diagnosis of pancreatic cancer she canceled her appointment with Marie Ford at this time will wait to retest - On aricept  - Has a psychiatrist Tamela Oddi and a talk therapist - At prior appointments, We reviewed the [facility] movement disorder notes in depth and the appointment at [facility] together with her good  friend last appointment. another good friend is with her again today, she recently married and they are very thrilled about that. - At prior appointment, reviewed resources in the community and I provided her with information on Parkinson's groups, Parkinson's specific exercise groups, she is taking advantage of all of these and is doing extremely well except for her stress as state above, she is working with a Orthoptist at [facility] to see if they can start a support group - went to PT for balance, gait disorder.  .- headaches resolved after sphenoidectomy. - Now established with Marie. Evern Bio at [facility] but will follow with me until/if needed to see [facility] again.   Meds ordered this encounter  Medications   carbidopa-levodopa (SINEMET CR) 50-200 MG tablet    Sig: Take 1 tablet by mouth at bedtime.    Dispense:  90 tablet    Refill:  3   carbidopa-levodopa (SINEMET IR) 25-100 MG tablet    Sig: Take 1.5 tablets by mouth 4 (four) times daily. -Try to separate Sinemet from food (especially protein-rich foods like meat, dairy, eggs) by about 30-60 mins - this will help the absorption of the medication. If you have some nausea with the medication, you can take it with some light food like crackers or ginger ale. Try taking at 7am, 10am, 1pm and 4pm and then at bedtime take the extended release(Sinemet CR)    Dispense:  540 tablet    Refill:  4   No orders of the defined types were placed in this encounter.    PRIOR A/P: - Initial appointment details: Ford is parkinsonian on exam(decreased arm swing, narrow gait with low clearance, left-sided weakness and decreased coordination, increased tone left arm with facilitation, slightly masked facies). She has a postural and action tremor. 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 We discussed motor and non motor symptoms, disease progression, treatments (dopamine), was seen at [facility] and will continue to follow with me  -  Continue Sinemet very low dose, discussed side effects  - Important to keep active, activity may slow down progression,   - She moved to an independent living facility, lots of changes ongoing in her life  - make sure she has POA and HCPOA  - Gait abnormality : MRI cervical spine: MRI of the cervical spine shows some slight arthritic changes but the spinal cord is normal. I don;t see any problems in the cervical spine to be causing your symptoms. . (MRI brain normal) did not show any etiology for gait abnormality. Will refer to PT for balance, gait disorder.    - Cognitive complaints:  Blood work completed, formal memory testing was overall positive, will repeat when needed, Speech Therapy for difficulty with expression and reception  - Discussed fall risks, orthostatic symptoms in PD and constipation and other things to watch for. Stay hydrated.   - Recommended speech therapy for her difficulty getting words out  - Recommend common sense with driving and consider formal driving test if needed in the future. She has very good insight and judgement.  - regular skin tests, increased risk of skin cancer in PD Ford's  - Fatigue: May consider sleep evaluation, she has very vivid dreams.  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   - Follow up with Marie Ford for her anxiety   Cc: Marie Putt, MD,  Marie Putt, MD  I spent over 60 minutes of face-to-face and non-face-to-face time with Ford on the  1. Parkinson's disease without dyskinesia, with fluctuating manifestations (HCC)     diagnosis.  This included previsit chart review, lab review, study review, order entry, electronic health record documentation, Ford education on the different diagnostic and therapeutic options, counseling and coordination of care, risks and benefits of management, compliance, or risk factor reduction   Naomie Dean, MD  [facility] [address] [place], [place] [address]  Phone [phone] Fax [phone]  I spent over 30 minutes of face-to-face and non-face-to-face time with Ford on the  1. Parkinson's disease without dyskinesia, with fluctuating manifestations (HCC)       diagnosis.  This included previsit chart review, lab review, study review, order entry, electronic health record documentation, Ford education on the different diagnostic and therapeutic options, counseling and coordination of care, risks and benefits of management, compliance, or risk factor reduction

## 2023-10-02 NOTE — Patient Instructions (Addendum)
Take dose 1.5: 7am, 10am, 1pm and 4pm IR Then at bedtime take the extended release (CR) Could also try further adjustments or Rytary, will see how this goes.  Meds ordered this encounter  Medications   carbidopa-levodopa (SINEMET CR) 50-200 MG tablet    Sig: Take 1 tablet by mouth at bedtime.    Dispense:  90 tablet    Refill:  3   carbidopa-levodopa (SINEMET IR) 25-100 MG tablet    Sig: Take 1.5 tablets by mouth 4 (four) times daily. -Try to separate Sinemet from food (especially protein-rich foods like meat, dairy, eggs) by about 30-60 mins - this will help the absorption of the medication. If you have some nausea with the medication, you can take it with some light food like crackers or ginger ale. Try taking at 7am, 10am, 1pm and 4pm and then at bedtime take the extended release(Sinemet CR)    Dispense:  540 tablet    Refill:  4     Carbidopa; Levodopa Extended-Release Tablets What is this medication? CARBIDOPA; LEVODOPA (kar bi DOE pa; lee voe DOE pa) treats the symptoms of Parkinson disease. It works by increasing the amount of dopamine in your brain, a substance which helps manage body movements and coordination. This reduces the symptoms of Parkinson, such as body stiffness and tremors. This medicine may be used for other purposes; ask your health care provider or pharmacist if you have questions. COMMON BRAND NAME(S): SINEMET, SINEMET CR What should I tell my care team before I take this medication? They need to know if you have any of these conditions: Depression or other mental health conditions Diabetes Glaucoma Heart disease, including history of a heart attack History of irregular heartbeat Kidney disease Liver disease Lung or breathing disease, such as asthma Narcolepsy Sleep apnea Stomach or intestine problems An unusual or allergic reaction to levodopa, carbidopa, other medications, foods, dyes, or preservatives Pregnant or trying to get  pregnant Breastfeeding How should I use this medication? Take this medication by mouth with a glass of water. Follow the directions on the prescription label. Swallow whole. Do not crush or chew. You may cut the tablets in half. Take your doses at regular intervals. Do not take your medication more often than directed. Do not stop taking except on the advice of your care team. Talk to your care team about the use of this medication in children. Special care may be needed. Overdosage: If you think you have taken too much of this medicine contact a poison control center or emergency room at once. NOTE: This medicine is only for you. Do not share this medicine with others. What if I miss a dose? If you miss a dose, take it as soon as you can. If it is almost time for your next dose, take only that dose. Do not take double or extra doses. What may interact with this medication? Do not take this medication with any of the following: MAOIs, such as Marplan, Nardil, and Parnate Reserpine Tetrabenazine This medication may also interact with the following: Alcohol Droperidol Entacapone Iron supplements or multivitamins with iron Isoniazid, INH Linezolid Medications for blood pressure Medications for mental health conditions Medications that help you fall asleep Metoclopramide Papaverine Procarbazine Tedizolid Rasagiline Selegiline Tolcapone This list may not describe all possible interactions. Give your health care provider a list of all the medicines, herbs, non-prescription drugs, or dietary supplements you use. Also tell them if you smoke, drink alcohol, or use illegal drugs. Some items may interact  with your medicine. What should I watch for while using this medication? Visit your care team for regular checks on your progress. Tell your care team if your symptoms do not start to get better or if they get worse. A severe reaction similar to neuroleptic malignant syndrome (NMS) may occur  if you reduce the dose of or stop taking this medication too quickly. Symptoms of NMS include high fever, stiff muscles, increased sweating, fast or irregular heartbeat, and confusion. Contact your care team right away if think you have NMS. This medication may affect your coordination, reaction time, or judgment. Do not drive or operate machinery until you know how this medication affects you. Sit up or stand slowly to reduce the risk of dizzy or fainting spells. Drinking alcohol with this medication can increase the risk of these side effects. When taking this medication, you may fall asleep without notice. You may be doing activities, such as driving a car, talking, or eating. You may not feel drowsy before it happens. Contact your care team right away if this happens to you. There have been reports of increased sexual urges or other strong urges, such as gambling while taking this medication. If you experience any of these while taking this medication, you should report this to your care team as soon as possible. You may experience a 'wearing off' effect before it is time to take your next dose of this medication. You may also experience an 'on-off' effect where the medication seems to stop working for minutes to hours, then suddenly starts working again. Tell your care team if this happens to you. Your dose may need to be adjusted. Eating high protein foods may affect how this medication works. Tell your care team if you change your diet. If you have diabetes, you may get a false-positive result for sugar in your urine. Check with your care team. This medication can make your saliva, sweat, or urine look dark red or black. This is normal but may stain clothing or fabrics. This medication may cause low levels of vitamin B6 in your body. Make sure that you get enough vitamin B6 while you are taking this medication. Discuss the foods you eat and the vitamins you take with your care team. What side effects  may I notice from receiving this medication? Side effects that you should report to your care team as soon as possible: Allergic reactions--skin rash, itching, hives, swelling of the face, lips, tongue, or throat Falling asleep during daily activities Heart rhythm changes--fast or irregular heartbeat, dizziness, feeling faint or lightheaded, chest pain, trouble breathing Low blood pressure--dizziness, feeling faint or lightheaded, blurry vision Mood and behavior changes--anxiety, nervousness, confusion, hallucinations, irritability, hostility, thoughts of suicide or self-harm, worsening mood, feelings of depression New or worsening uncontrolled and repetitive movements of the face, mouth, or upper body Stomach bleeding--bloody or black, tar-like stools, vomiting blood or brown material that looks like coffee grounds Sudden eye pain or change in vision such as blurry vision, seeing halos around lights, vision loss Urges to engage in impulsive behaviors such as gambling, binge eating, sexual activity, or shopping in ways that are unusual for you Side effects that usually do not require medical attention (report to your care team if they continue or are bothersome): Dark red or black saliva, sweat, or urine Dizziness Drowsiness Headache Nausea This list may not describe all possible side effects. Call your doctor for medical advice about side effects. You may report side effects to FDA at 1-800-FDA-1088.  Where should I keep my medication? Keep out of the reach of children. Store below 30 degrees C (86 degrees F). Keep container tightly closed. Throw away any unused medication after the expiration date. NOTE: This sheet is a summary. It may not cover all possible information. If you have questions about this medicine, talk to your doctor, pharmacist, or health care provider.  2024 Elsevier/Gold Standard (2022-06-21 00:00:00)

## 2023-10-03 ENCOUNTER — Other Ambulatory Visit: Payer: Self-pay

## 2023-10-05 ENCOUNTER — Encounter: Payer: Self-pay | Admitting: Nurse Practitioner

## 2023-10-17 ENCOUNTER — Telehealth: Payer: Self-pay

## 2023-10-17 ENCOUNTER — Encounter: Payer: Self-pay | Admitting: Hematology

## 2023-10-17 ENCOUNTER — Other Ambulatory Visit: Payer: Self-pay

## 2023-10-17 DIAGNOSIS — C250 Malignant neoplasm of head of pancreas: Secondary | ICD-10-CM

## 2023-10-17 DIAGNOSIS — R112 Nausea with vomiting, unspecified: Secondary | ICD-10-CM | POA: Insufficient documentation

## 2023-10-17 DIAGNOSIS — E860 Dehydration: Secondary | ICD-10-CM | POA: Insufficient documentation

## 2023-10-17 NOTE — Telephone Encounter (Signed)
Spoke with pt's daughter Lurena Joiner regarding the MyChart message sent to Santiago Glad, NP about pt's symptoms.  Lurena Joiner stated that the pt's n/v stated on 10/07/2023.  Lurena Joiner was not able to give a specific number of n/v episodes the pt has in a day but did stated that the pt is constantly nauseous and vomits after eating everything.  Lurena Joiner stated the vomiting continues until the pt is having dry heaves.  Lurena Joiner stated the pt is not really drinking just taking sips of water and Gatorade.  Lurena Joiner stated the pt is having dizziness and increased shakiness.  Pt has hx of Parkinson but shakiness has increased recently therefore pt's provider increased the dosage on pt's Carbidopa/Levodopa.  Pt has increased constipation per Lurena Joiner.  Lurena Joiner was not able to give me the pt's last BM date.  Lurena Joiner stated that the pt tried taking Compazine and Marinol but after they did not work pt stopped taking medications.  Lurena Joiner stated that pt was only taking 1 or the other but never staggering the antiemetic medications until relief was obtained.  Pt also have a prescription for phenergan that the pt is not taking at all.  Lurena Joiner confirmed that the pt is not taking Protonix; therefore, recommended the pt start taking Famotidine OTC 20mg  BID.  Also, recommended if the pt would eat a soft bland diet to give the pt's stomach some rest.  Lurena Joiner verbalized understanding and stated she would talk to the pt about diet changes, increase hydration, staggering the antiemetics, and starting Famotidine.  Asked if pt would be able to come in to have IVF and IV antiemetics in [facility] if [facility] has availability on 10/18/2023.  Lurena Joiner stated "Yes" they are willing to come in if [facility] could see them.  Also, stated that Dr. [name] would like the pt to have a KBO (X-Ray of Abdomen) to r/o bowel blockage.  Stated this RN will f/u with pt and daughter once this nurse hear back from [facility].

## 2023-10-17 NOTE — Telephone Encounter (Signed)
Spoke with pt via telephone to confirm appt for 10/18/2023 for [facility] and abdominal xray.  Instructed pt to have xray done first and then come to the cancer center for appt w/[facility] at 11am.  Pt verbalized understanding.

## 2023-10-18 ENCOUNTER — Inpatient Hospital Stay (HOSPITAL_BASED_OUTPATIENT_CLINIC_OR_DEPARTMENT_OTHER): Payer: Medicare PPO | Admitting: Physician Assistant

## 2023-10-18 ENCOUNTER — Encounter: Payer: Self-pay | Admitting: Hematology

## 2023-10-18 ENCOUNTER — Ambulatory Visit (HOSPITAL_COMMUNITY)
Admission: RE | Admit: 2023-10-18 | Discharge: 2023-10-18 | Disposition: A | Discharge status: Home / Self Care | Payer: Medicare PPO | Source: Ambulatory Visit | Attending: Hematology

## 2023-10-18 ENCOUNTER — Inpatient Hospital Stay: Payer: Medicare PPO

## 2023-10-18 ENCOUNTER — Inpatient Hospital Stay: Payer: Medicare PPO | Attending: Physician Assistant

## 2023-10-18 VITALS — BP 159/72 | HR 73 | Temp 97.7°F | Resp 18

## 2023-10-18 VITALS — BP 166/81 | HR 69 | Temp 98.1°F | Resp 18 | Ht 66.0 in | Wt 135.9 lb

## 2023-10-18 DIAGNOSIS — R112 Nausea with vomiting, unspecified: Secondary | ICD-10-CM

## 2023-10-18 DIAGNOSIS — C250 Malignant neoplasm of head of pancreas: Secondary | ICD-10-CM | POA: Insufficient documentation

## 2023-10-18 DIAGNOSIS — H538 Other visual disturbances: Secondary | ICD-10-CM | POA: Diagnosis not present

## 2023-10-18 DIAGNOSIS — K5900 Constipation, unspecified: Secondary | ICD-10-CM | POA: Diagnosis not present

## 2023-10-18 DIAGNOSIS — Z95828 Presence of other vascular implants and grafts: Secondary | ICD-10-CM

## 2023-10-18 DIAGNOSIS — E860 Dehydration: Secondary | ICD-10-CM

## 2023-10-18 LAB — CMP (CANCER CENTER ONLY)
ALT: <5 U/L (ref 0–44)
AST: 15 U/L (ref 15–41)
Albumin: 3.9 g/dL (ref 3.5–5.0)
Alkaline Phosphatase: 67 U/L (ref 38–126)
Anion gap: 7 (ref 5–15)
BUN: 22 mg/dL (ref 8–23)
CO2: 28 mmol/L (ref 22–32)
Calcium: 9.7 mg/dL (ref 8.9–10.3)
Chloride: 104 mmol/L (ref 98–111)
Creatinine: 1.08 mg/dL — ABNORMAL HIGH (ref 0.44–1.00)
GFR, Estimated: 52 mL/min — ABNORMAL LOW (ref >60)
Glucose, Bld: 127 mg/dL — ABNORMAL HIGH (ref 70–99)
Potassium: 3.6 mmol/L (ref 3.5–5.1)
Sodium: 139 mmol/L (ref 135–145)
Total Bilirubin: 0.9 mg/dL (ref <1.2)
Total Protein: 7 g/dL (ref 6.5–8.1)

## 2023-10-18 LAB — CBC WITH DIFFERENTIAL (CANCER CENTER ONLY)
Abs Immature Granulocytes: 0.02 10*3/uL (ref 0.00–0.07)
Basophils Absolute: 0 10*3/uL (ref 0.0–0.1)
Basophils Relative: 1 %
Eosinophils Absolute: 0.1 10*3/uL (ref 0.0–0.5)
Eosinophils Relative: 2 %
HCT: 39.2 % (ref 36.0–46.0)
Hemoglobin: 13.8 g/dL (ref 12.0–15.0)
Immature Granulocytes: 0 %
Lymphocytes Relative: 19 %
Lymphs Abs: 1.1 10*3/uL (ref 0.7–4.0)
MCH: 33.3 pg (ref 26.0–34.0)
MCHC: 35.2 g/dL (ref 30.0–36.0)
MCV: 94.5 fL (ref 80.0–100.0)
Monocytes Absolute: 0.8 10*3/uL (ref 0.1–1.0)
Monocytes Relative: 14 %
Neutro Abs: 3.6 10*3/uL (ref 1.7–7.7)
Neutrophils Relative %: 64 %
Platelet Count: 265 10*3/uL (ref 150–400)
RBC: 4.15 MIL/uL (ref 3.87–5.11)
RDW: 12.3 % (ref 11.5–15.5)
WBC Count: 5.7 10*3/uL (ref 4.0–10.5)
nRBC: 0 % (ref 0.0–0.2)

## 2023-10-18 LAB — MAGNESIUM: Magnesium: 1.9 mg/dL (ref 1.7–2.4)

## 2023-10-18 MED ORDER — SODIUM CHLORIDE 0.9 % IV SOLN
Freq: Once | INTRAVENOUS | Status: AC
Start: 2023-10-18 — End: 2023-10-18

## 2023-10-18 MED ORDER — SODIUM CHLORIDE 0.9% FLUSH
10.0000 mL | INTRAVENOUS | Status: DC | PRN
  Administered 2023-10-18: 10 mL

## 2023-10-18 MED ORDER — FAMOTIDINE IN NACL 20-0.9 MG/50ML-% IV SOLN
20.0000 mg | Freq: Once | INTRAVENOUS | Status: AC
Start: 2023-10-18 — End: 2023-10-18
  Administered 2023-10-18: 20 mg via INTRAVENOUS
  Filled 2023-10-18: qty 50

## 2023-10-18 MED ORDER — ONDANSETRON HCL 4 MG/2ML IJ SOLN
8.0000 mg | Freq: Once | INTRAMUSCULAR | Status: AC
  Administered 2023-10-18: 8 mg via INTRAVENOUS
  Filled 2023-10-18: qty 4

## 2023-10-18 MED ORDER — SODIUM CHLORIDE 0.9 % IV SOLN: Freq: Once | INTRAVENOUS | Status: DC

## 2023-10-18 MED ORDER — HEPARIN SOD (PORK) LOCK FLUSH 100 UNIT/ML IV SOLN
500.0000 [IU] | Freq: Once | INTRAVENOUS | Status: AC | PRN
  Administered 2023-10-18: 500 [IU]

## 2023-10-18 NOTE — Patient Instructions (Addendum)
Pepcid can be taken twice daily to help prevent heartburn. Please try taking this instead of Protonix to see if it helps manage your symptoms better.   If you continue to have nausea you should take your Marinol and compazine. The Marinol should be taken before breakfast and dinner.   The compazine can be taken every 6 hours.    Your stool should be toothpaste consistency. If harder take Miralax. If looser stool then hold Miralax until stool starts to become firm.

## 2023-10-18 NOTE — Progress Notes (Signed)
Symptom Management Consult Note [facility]    Patient Care Team: [name], MD as PCP - General (Family Medicine) Malachy Mood, MD as Consulting Physician (Oncology)    Name / MRN / DOB: Marie Ford  [id number]  Jul 22, 1946   Date of visit: 10/18/2023   Chief Complaint/Reason for visit: nausea and vomiting   Last treatment: Gemzar and Abraxane Day 1   Cycle 4 on 08/14/2023   ASSESSMENT & PLAN: Patient is a 77 y.o. female with oncologic history of pancreatic cancer followed by Dr. [name].  I have viewed most recent oncology note and lab work.    #Pancreatic caner  - Discontinued chemotherapy in September. Completed radiation 09/26/23. - Next appointment with oncologist is 10/22/23  #Nausea and Vomiting -Nausea and vomiting for three weeks, exacerbated postprandially. Has not been consistently taking Marinol or Compazine.  Emphasized consistent antiemetic use for one week to control symptoms. Zofran can be taken if Marinol and Compazine are ineffective.  -Patient completed chemotherapy and radiation in September and October respectively.  Nausea could be side effect of radiation as she is still less than 1 month from final treatment. She is also reporting symptoms of acid reflux which could be contributing to nausea. Abdominal exam is benign.  -Patient received 1L IVF for hydration support with zofran Pepcid for symptom management.  -Offered prescription for zofran however patient declined and wants sto try compazine and Marinol first. -Patient advised to try OTC Pepcid daily and hold Protonix for now to see if the change helps better manage symptoms. Patient ate an apple while here and did not have her typical postprandial nausea.   #Constipation -Mild constipation managed with MiraLAX, improving bowel movements. Constipation could also be contributing to nausea. -Abdominal xray ordered PTA as there was concern for bowel obstruction based on triage note. Image  is negative for obstruction and shows mild stool burden, - Continue MiraLAX as needed.   Strict ED precautions discussed should symptoms worsen.   Heme/Onc History: Oncology History Overview Note   Cancer Staging  Pancreatic cancer [facility]) Staging form: Exocrine Pancreas, AJCC 8th Edition - Clinical: Stage IB (cT2, cN0, cM0) - Signed by Heilingoetter, Cassandra L, PA-C on 04/12/2023 Total positive nodes: 0     Pancreatic cancer (HCC)  03/27/2023 Imaging   CT ABDOMEN PELVIS W CONTRAST   IMPRESSION: Low-attenuation mass centered along the pancreatic head with involvement of the distal common duct with the associated biliary duct ductal dilatation and pancreatic atrophy. Changes are worrisome for neoplasm either a pancreatic neoplasm or cholangiocarcinoma. Recommend further evaluation.   No liver metastases. No pathologically enlarged upper abdominal nodes.   Diffuse colonic stool.  Normal appendix.  Few diverticula.   Portions of the pelvis are obscured by the streak artifact from the bilateral hip arthroplasties.   03/29/2023 Imaging   DG ERCP   IMPRESSION: Intraoperative fluoroscopic images show dilated intra or extrahepatic bile ducts with high-grade stenosis at the level of the distal CBD. Biliary tree is decompressed following placement of common bile duct stent.   04/08/2023 Pathology Results   CYTOLOGY - NON PAP  CASE: [id number]  PATIENT: Marie Ford  Non-Gynecological Cytology Report   FINAL MICROSCOPIC DIAGNOSIS:  A. PANCREAS, HEAD, FINE NEEDLE ASPIRATION:  - Malignant cells present (see Comment)      04/08/2023 Procedure   EUS by Dr. Meridee Score   EUS impression  - A mass was identified in the pancreatic head/ genu of the pancreas. Cytology results are pending.  However, the endosonographic appearance is highly suspicious for adenocarcinoma. This was staged T2 N0 Mx by endosonographic criteria. The staging applies if malignancy is confirmed. Fine needle  biopsy performed.  - There was dilation in the common bile duct and in the common hepatic duct which measured up to 8 mm.  - One stent was visualized endosonographically within the common bile duct.  - Hyperechoic material consistent with sludge was visualized endosonographically in the gallbladder.  - Two enlarged lymph nodes were visualized in the celiac region ( level 20) and porta hepatis region. Tissue has not been obtained. However, the endosonographic appearance is suggestive of benign inflammatory change based on the visualization endosonographically.   04/12/2023 Initial Diagnosis   Pancreatic cancer (HCC)   04/12/2023 Cancer Staging   Staging form: Exocrine Pancreas, AJCC 8th Edition - Clinical: Stage IB (cT2, cN0, cM0) - Signed by Heilingoetter, Cassandra L, PA-C on 04/12/2023 Total positive nodes: 0   04/12/2023 Tumor Marker   Patient's tumor was tested for the following markers: CA 19.9. Results of the tumor marker test revealed 512.   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  Report date is 05/03/2023.   The CancerNext-Expanded gene panel offered by [facility] and includes sequencing, rearrangement, and RNA analysis for the following 71 genes:  AIP, ALK, APC, ATM, BAP1, BARD1, BMPR1A, BRCA1, BRCA2, BRIP1, CDC73, CDH1, CDK4, CDKN1B, CDKN2A, CHEK2, DICER1, FH, FLCN, KIF1B, LZTR1,MAX, MEN1, MET, MLH1, MSH2, MSH6, MUTYH, NF1, NF2, NTHL1, PALB2, PHOX2B, PMS2, POT1, PRKAR1A, PTCH1, PTEN, RAD51C,RAD51D, RB1, RET, SDHA, SDHAF2, SDHB, SDHC, SDHD, SMAD4, SMARCA4, SMARCB1, SMARCE1, STK11, SUFU, TMEM127, TP53,TSC1, TSC2 and VHL (sequencing and deletion/duplication); AXIN2, CTNNA1, EGFR, EGLN1, HOXB13, KIT, MITF, MSH3, PDGFRA, POLD1 and POLE (sequencing only); EPCAM and GREM1 (deletion/duplication only).     05/27/2023 -  Chemotherapy   Patient is on Treatment Plan : PANCREATIC Abraxane D1,8,15 + Gemcitabine D1,8,15 q28d         Interval  history-: Discussed the use of AI scribe software for clinical note transcription with the patient, who gave verbal consent to proceed.   Marie Ford is a 77 y.o. female with oncologic history as above presenting to [facility] today with chief complaint of nausea and vomiting. She is accompanied by a friend who provides additional history.  Patient reports three-week history of recurrent nausea with vomiting. The nausea is reported to be particularly severe within half an hour of eating. She denies any associated abdominal pain. The patient has been taking Marinol and Compazine for nausea but stopped three to four days prior to the consultation due to perceived ineffectiveness. The patient has been managing constipation with MiraLax, and recent bowel movements have been loose. The patient also reports a burning sensation in the stomach, despite being on Protonix for acid reflux.  The patient has a history of urinary tract infections but denies any current symptoms consistent with a UTI. The patient has been attempting to manage dehydration with an electrolyte drink but expressed uncertainty about the correct dosage. The patient also reports a persistent awareness of her stomach, describing it as a constant burning sensation.    ROS  All other systems are reviewed and are negative for acute change except as noted in the HPI.    Allergies  Allergen Reactions   Erythromycin Shortness Of Breath and Rash   Macrobid [Nitrofurantoin] Other (See Comments)    Pass out   Keflex [Cephalexin] Nausea And Vomiting   Levofloxacin Nausea Only  Penicillins Rash    Medicinal Product Containing Penicillin And Acting As Antibacterial Agent (Product)   Sulfamethoxazole-Trimethoprim Rash     Past Medical History:  Diagnosis Date   Chronic kidney disease    stage 3    Chronic sphenoidal sinusitis    Degenerative arthritis of hip    requirde surgery 2009   Depression 2010   Diplopia 2011   in left lateral  gaze    Diplopia    " my eyes dont track together, i have corrective prisms in my eye glasses"    Diverticulosis 2006   noted in a colonoscopy   Esophageal reflux 2013   Hypertension    IGT (impaired glucose tolerance) 2011   denies hx of diabetes    Insomnia 2009   Medical history non-contributory    Mixed hyperlipidemia 2000   Osteoarthritis    Osteopenia 2005   Other and unspecified hyperlipidemia 2000   Other specified disease of sebaceous glands    Palpitations 2009   secondary to PSVT   Parkinson disease (HCC)    PNA (pneumonia) 2014   Psoriasis 2004   PSVT (paroxysmal supraventricular tachycardia) (HCC) 2009   "report it has been some time since i had that "    Situational stress    Urinary tract infection    thru wed 05/15/23, completed 3d antibiotics   Venous insufficiency since 2017     Past Surgical History:  Procedure Laterality Date   BILIARY BRUSHING  03/29/2023   Procedure: BILIARY BRUSHING;  Surgeon: Hilarie Fredrickson, MD;  Location: [facility] ENDOSCOPY;  Service: Gastroenterology;;   BILIARY DILATION  08/08/2023   Procedure: BILIARY DILATION;  Surgeon: Lemar Lofty., MD;  Location: [facility] ENDOSCOPY;  Service: Gastroenterology;;   BILIARY STENT PLACEMENT  03/29/2023   Procedure: BILIARY STENT PLACEMENT;  Surgeon: Hilarie Fredrickson, MD;  Location: [facility] ENDOSCOPY;  Service: Gastroenterology;;   BILIARY STENT PLACEMENT N/A 08/08/2023   Procedure: BILIARY STENT PLACEMENT;  Surgeon: Lemar Lofty., MD;  Location: [facility] ENDOSCOPY;  Service: Gastroenterology;  Laterality: N/A;   BIOPSY  04/08/2023   Procedure: BIOPSY;  Surgeon: Meridee Score Netty Starring., MD;  Location: [facility] ENDOSCOPY;  Service: Gastroenterology;;   CATARACT EXTRACTION W/ INTRAOCULAR LENS  IMPLANT, BILATERAL     COLONOSCOPY  01/30/2010   Jarold Motto   ENDOSCOPIC RETROGRADE CHOLANGIOPANCREATOGRAPHY (ERCP) WITH PROPOFOL N/A 08/08/2023   Procedure: ENDOSCOPIC RETROGRADE CHOLANGIOPANCREATOGRAPHY (ERCP) WITH PROPOFOL;   Surgeon: Lemar Lofty., MD;  Location: [facility] ENDOSCOPY;  Service: Gastroenterology;  Laterality: N/A;   ERCP N/A 03/29/2023   Procedure: ENDOSCOPIC RETROGRADE CHOLANGIOPANCREATOGRAPHY (ERCP);  Surgeon: Hilarie Fredrickson, MD;  Location: [facility] ENDOSCOPY;  Service: Gastroenterology;  Laterality: N/A;   ESOPHAGOGASTRODUODENOSCOPY N/A 08/08/2023   Procedure: ESOPHAGOGASTRODUODENOSCOPY (EGD);  Surgeon: Lemar Lofty., MD;  Location: [facility] ENDOSCOPY;  Service: Gastroenterology;  Laterality: N/A;   ESOPHAGOGASTRODUODENOSCOPY (EGD) WITH PROPOFOL N/A 04/08/2023   Procedure: ESOPHAGOGASTRODUODENOSCOPY (EGD) WITH PROPOFOL;  Surgeon: Meridee Score Netty Starring., MD;  Location: [facility] ENDOSCOPY;  Service: Gastroenterology;  Laterality: N/A;   ETHMOIDECTOMY Left 11/17/2020   Procedure: LEFT SIDED TOTAL ETHMOIDECTOMY;  Surgeon: Drema Halon, MD;  Location: [facility];  Service: ENT;  Laterality: Left;   EUS N/A 04/08/2023   Procedure: UPPER ENDOSCOPIC ULTRASOUND (EUS) LINEAR;  Surgeon: Lemar Lofty., MD;  Location: [facility] ENDOSCOPY;  Service: Gastroenterology;  Laterality: N/A;   EUS N/A 08/08/2023   Procedure: UPPER ENDOSCOPIC ULTRASOUND (EUS) LINEAR;  Surgeon: Lemar Lofty., MD;  Location: [facility] ENDOSCOPY;  Service: Gastroenterology;  Laterality: N/A;   EYE SURGERY     FIDUCIAL MARKER PLACEMENT  08/08/2023   Procedure: FIDUCIAL MARKER PLACEMENT;  Surgeon: Lemar Lofty., MD;  Location: [facility] ENDOSCOPY;  Service: Gastroenterology;;   FINE NEEDLE ASPIRATION N/A 04/08/2023   Procedure: FINE NEEDLE ASPIRATION (FNA) LINEAR;  Surgeon: Lemar Lofty., MD;  Location: [facility] ENDOSCOPY;  Service: Gastroenterology;  Laterality: N/A;   HEMORRHOID SURGERY     IR IMAGING GUIDED PORT INSERTION  05/17/2023   JOINT REPLACEMENT     LASIK     left medial and superior rectus muscle recesion for diplopia correction     REMOVAL OF STONES  08/08/2023   Procedure: REMOVAL OF SLUDGE;  Surgeon:  Meridee Score Netty Starring., MD;  Location: [facility] ENDOSCOPY;  Service: Gastroenterology;;   SINUS ENDO WITH FUSION Left 11/17/2020   Procedure: SINUS ENDOSCOPY WITH FUSION NAVIGATION;  Surgeon: Drema Halon, MD;  Location: [facility];  Service: ENT;  Laterality: Left;   SPHENOIDECTOMY Left 11/17/2020   Procedure: LEFT SIDED SPHENOIDECTOMY WITH TISSUE REMOVAL;  Surgeon: Drema Halon, MD;  Location: [facility];  Service: ENT;  Laterality: Left;   SPHINCTEROTOMY  03/29/2023   Procedure: SPHINCTEROTOMY;  Surgeon: Hilarie Fredrickson, MD;  Location: [facility] ENDOSCOPY;  Service: Gastroenterology;;   Francine Graven REMOVAL  08/08/2023   Procedure: STENT REMOVAL;  Surgeon: Lemar Lofty., MD;  Location: [facility] ENDOSCOPY;  Service: Gastroenterology;;   surgery of right eye rectus muscles for diplpia correction   2019   TOTAL HIP ARTHROPLASTY Right 2010   TOTAL HIP ARTHROPLASTY Left 08/19/2019   Procedure: TOTAL HIP ARTHROPLASTY ANTERIOR APPROACH;  Surgeon: Ollen Gross, MD;  Location: [facility] ORS;  Service: Orthopedics;  Laterality: Left;    TURBINATE REDUCTION Left 11/17/2020   Procedure: LEFT SIDED TURBINATE REDUCTION;  Surgeon: Drema Halon, MD;  Location: [facility];  Service: ENT;  Laterality: Left;   URETHRAL SLING     midurethral sling with TVT Exact and Cystoscopy    Social History   Socioeconomic History   Marital status: Single    Spouse name: Not on file   Number of children: 1   Years of education: Not on file   Highest education level: Doctorate  Occupational History   Occupation: retired  Tobacco Use   Smoking status: Never   Smokeless tobacco: Never  Vaping Use   Vaping status: Never Used  Substance and Sexual Activity   Alcohol use: Yes    Comment: rarely   Drug use: No   Sexual activity: Not Currently    Birth control/protection: Post-menopausal  Other Topics Concern   Not on file  Social History Narrative   Lives at  [place] independent living   Right handed   Caffeine: rarely   Social Determinants of Health   Financial Resource Strain: Not on file  Food Insecurity: No Food Insecurity (08/21/2023)   Hunger Vital Sign    Worried About Running Out of Food in the Last Year: Never true    Ran Out of Food in the Last Year: Never true  Transportation Needs: No Transportation Needs (08/21/2023)   PRAPARE - Administrator, Civil Service (Medical): No    Lack of Transportation (Non-Medical): No  Physical Activity: Not on file  Stress: Not on file  Social Connections: Unknown (04/20/2022)   [facility]   Social Network    Social Network: Not on file  Intimate Partner Violence: Not At Risk (  08/21/2023)   Humiliation, Afraid, Rape, and Kick questionnaire    Fear of Current or Ex-Partner: No    Emotionally Abused: No    Physically Abused: No    Sexually Abused: No    Family History  Problem Relation Age of Onset   Congestive Heart Failure Mother    CVA Father    Heart failure Father    Non-Hodgkin's lymphoma Sister    Alcohol abuse Sister    Liver cancer Brother    Colon cancer Brother 33   Colitis Brother    Hypertension Brother    Gout Brother    Melanoma Daughter    Parkinson's disease Cousin    Esophageal cancer Neg Hx    Rectal cancer Neg Hx    Stomach cancer Neg Hx    Colon polyps Neg Hx      Current Outpatient Medications:    ALPRAZolam (XANAX) 0.5 MG tablet, Take 0.25 mg by mouth as needed for anxiety. Takes as needed for sleep, Disp: , Rfl:    AUVELITY 45-105 MG TBCR, Take 1 tablet by mouth every evening., Disp: , Rfl:    buPROPion (WELLBUTRIN XL) 150 MG 24 hr tablet, Take 150 mg by mouth daily., Disp: , Rfl:    Calcium Carbonate Antacid (CALCIUM CARBONATE PO), Take by mouth., Disp: , Rfl:    carbidopa-levodopa (SINEMET CR) 50-200 MG tablet, Take 1 tablet by mouth at bedtime., Disp: 90 tablet, Rfl: 3   carbidopa-levodopa (SINEMET IR)  25-100 MG tablet, Take 1.5 tablets by mouth 4 (four) times daily. -Try to separate Sinemet from food (especially protein-rich foods like meat, dairy, eggs) by about 30-60 mins - this will help the absorption of the medication. If you have some nausea with the medication, you can take it with some light food like crackers or ginger ale. Try taking at 7am, 10am, 1pm and 4pm and then at bedtime take the extended release(Sinemet CR), Disp: 540 tablet, Rfl: 4   donepezil (ARICEPT) 10 MG tablet, Take 1 tablet (10 mg total) by mouth at bedtime., Disp: 30 tablet, Rfl: 5   dronabinol (MARINOL) 2.5 MG capsule, Take 1 capsule (2.5 mg total) by mouth 2 (two) times daily before a meal. Ok to refill on 08/06/2023, Disp: 60 capsule, Rfl: 0   lidocaine-prilocaine (EMLA) cream, Apply to affected area once, Disp: 30 g, Rfl: 3   losartan (COZAAR) 50 MG tablet, Take 1 tablet (50 mg total) by mouth daily., Disp: 90 tablet, Rfl: 1   magic mouthwash (multi-ingredient) oral suspension, Take 5 mLs by mouth 4 (four) times daily., Disp: 240 mL, Rfl: 1   Multiple Vitamin (MULTIVITAMIN WITH MINERALS) TABS tablet, Take 1 tablet by mouth daily. Women's Multivitamin, Disp: , Rfl:    OVER THE COUNTER MEDICATION, Take 1 tablet by mouth daily as needed (constipation)., Disp: , Rfl:    pantoprazole (PROTONIX) 40 MG tablet, Take 40 mg by mouth daily., Disp: , Rfl:    Polyethyl Glycol-Propyl Glycol (SYSTANE) 0.4-0.3 % SOLN, Place 1 drop into both eyes 3 (three) times daily as needed (dry/irritated eyes.)., Disp: , Rfl:    polyethylene glycol (MIRALAX / GLYCOLAX) 17 g packet, Take 17 g by mouth daily., Disp: , Rfl:    pravastatin (PRAVACHOL) 80 MG tablet, Take 1 tablet (80 mg total) by mouth every evening., Disp: , Rfl:    prochlorperazine (COMPAZINE) 10 MG tablet, Take 1 tablet (10 mg total) by mouth every 6 (six) hours as needed for nausea or vomiting., Disp: 30 tablet, Rfl:  1   promethazine (PHENERGAN) 25 MG tablet, Take 25 mg by mouth  every 6 (six) hours as needed for nausea or vomiting., Disp: , Rfl:    triamcinolone (NASACORT) 55 MCG/ACT AERO nasal inhaler, Place 2 sprays into the nose daily. Use at night (Patient taking differently: Place 1 spray into the nose daily. Use at night), Disp: 1 each, Rfl: 12  PHYSICAL EXAM: ECOG FS:1 - Symptomatic but completely ambulatory    Vitals:   10/18/23 1106  BP: (!) 159/72  Pulse: 73  Resp: 18  Temp: 97.7 F (36.5 C)  SpO2: 100%   Physical Exam Vitals and nursing note reviewed.  Constitutional:      Appearance: She is not ill-appearing or toxic-appearing.  HENT:     Head: Normocephalic.  Eyes:     Conjunctiva/sclera: Conjunctivae normal.  Cardiovascular:     Rate and Rhythm: Normal rate and regular rhythm.     Pulses: Normal pulses.     Heart sounds: Normal heart sounds.  Pulmonary:     Effort: Pulmonary effort is normal.     Breath sounds: Normal breath sounds.  Abdominal:     General: Bowel sounds are normal. There is no distension.     Palpations: Abdomen is soft.     Tenderness: There is no abdominal tenderness. There is no guarding.  Musculoskeletal:     Cervical back: Normal range of motion.  Skin:    General: Skin is warm and dry.  Neurological:     Mental Status: She is alert.        LABORATORY DATA: I have reviewed the data as listed    Latest Ref Rng & Units 10/18/2023   10:38 AM 09/05/2023   11:57 AM 08/14/2023    8:37 AM  CBC  WBC 4.0 - 10.5 K/uL 5.7  6.1  5.3   Hemoglobin 12.0 - 15.0 g/dL 87.5  64.3  32.9   Hematocrit 36.0 - 46.0 % 39.2  37.5  36.7   Platelets 150 - 400 K/uL 265  385  286         Latest Ref Rng & Units 10/18/2023   10:38 AM 09/05/2023   11:57 AM 08/14/2023    8:37 AM  CMP  Glucose 70 - 99 mg/dL 518  841  660   BUN 8 - 23 mg/dL 22  22  18    Creatinine 0.44 - 1.00 mg/dL 6.30  1.60  1.09   Sodium 135 - 145 mmol/L 139  140  141   Potassium 3.5 - 5.1 mmol/L 3.6  3.5  3.4   Chloride 98 - 111 mmol/L 104  105  105   CO2  22 - 32 mmol/L 28  28  28    Calcium 8.9 - 10.3 mg/dL 9.7  9.1  9.3   Total Protein 6.5 - 8.1 g/dL 7.0  6.7  6.6   Total Bilirubin <1.2 mg/dL 0.9  0.7  1.0   Alkaline Phos 38 - 126 U/L 67  73  88   AST 15 - 41 U/L 15  15  15    ALT 0 - 44 U/L <5  <5  <5        RADIOGRAPHIC STUDIES (from last 24 hours if applicable) I have personally reviewed the radiological images as listed and agreed with the findings in the report. DG Abd 2 Views  Result Date: 10/18/2023 CLINICAL DATA:  Vomiting, constipation. History of pancreatic cancer. EXAM: ABDOMEN - 2 VIEW COMPARISON:  None Available. FINDINGS: The  bowel gas pattern is normal. There is no evidence of free air. Biliary stent is noted in right upper quadrant. Mild amount of stool seen throughout the colon. IMPRESSION: Mild stool burden.  No abnormal bowel dilatation. Electronically Signed   By: Lupita Raider M.D.   On: 10/18/2023 12:30        Visit Diagnosis: 1. Malignant neoplasm of head of pancreas (HCC)   2. Nausea and vomiting, unspecified vomiting type      No orders of the defined types were placed in this encounter.   All questions were answered. The patient knows to call the clinic with any problems, questions or concerns. No barriers to learning was detected.  A total of more than 30 minutes were spent on this encounter with face-to-face time and non-face-to-face time, including preparing to see the patient, ordering tests and/or medications, counseling the patient and coordination of care as outlined above.    Thank you for allowing me to participate in the care of this patient.    Shanon Ace, PA-C Department of Hematology/Oncology [facility] Phone: [phone]  [phone]    10/18/2023 1:50 PM

## 2023-10-22 ENCOUNTER — Inpatient Hospital Stay: Payer: Medicare PPO

## 2023-10-22 ENCOUNTER — Inpatient Hospital Stay: Payer: Medicare PPO | Admitting: Hematology

## 2023-10-22 ENCOUNTER — Other Ambulatory Visit: Payer: Self-pay

## 2023-10-22 ENCOUNTER — Encounter: Payer: Self-pay | Admitting: Hematology

## 2023-10-22 VITALS — BP 178/95 | HR 71 | Temp 97.7°F | Resp 17 | Wt 137.8 lb

## 2023-10-22 DIAGNOSIS — R112 Nausea with vomiting, unspecified: Secondary | ICD-10-CM

## 2023-10-22 DIAGNOSIS — Z95828 Presence of other vascular implants and grafts: Secondary | ICD-10-CM

## 2023-10-22 DIAGNOSIS — C250 Malignant neoplasm of head of pancreas: Secondary | ICD-10-CM

## 2023-10-22 DIAGNOSIS — E860 Dehydration: Secondary | ICD-10-CM

## 2023-10-22 MED ORDER — OLANZAPINE 10 MG PO TABS: 10.0000 mg | ORAL_TABLET | Freq: Every day | ORAL | 0 refills | Status: DC

## 2023-10-22 MED ORDER — SODIUM CHLORIDE 0.9% FLUSH
10.0000 mL | INTRAVENOUS | Status: DC | PRN
  Administered 2023-10-22: 10 mL

## 2023-10-22 MED ORDER — HEPARIN SOD (PORK) LOCK FLUSH 100 UNIT/ML IV SOLN
500.0000 [IU] | Freq: Once | INTRAVENOUS | Status: AC | PRN
  Administered 2023-10-22: 500 [IU]

## 2023-10-22 NOTE — Assessment & Plan Note (Signed)
-  stage IB (T2, N0, Mx), diagnosed in April 2024 -Presented to the emergency room on 4/17 with jaundice and dark urine -Baseline bilirubin elevated at 8.6 -CT scan showed pancreatic head lesion with involvement of the distal CBD and associated biliary ductal dilation.  No evidence of lymphadenopathy or metastatic disease to the liver -Had ERCP and stent placement on 4/19 -Underwent EUS by Dr. Meridee Score on 04/08/23 showing 30 x 23 mm pancreatic head lesion without any vascular involvement.  -The final pathology showed pancreatic adenocarcinoma -She has met pancreatobiliary surgeon Dr. Donell Beers, and she declined the surgery.  Patient understands that her cancer is not curable without surgery. -I encouraged her to consider chemotherapy for 3 to 6 months, with consolidation radiation -Patient has decided to start chemotherapy gemcitabine and cisplatin every 2 weeks.  She started on 05/27/23, she has received a total of 4 doses of today. -plan to repeat CT on 8/26 -Her tumor marker CA 19.9 has dropped significantly since she started chemo, she is likely responding to treatment. -She underwent biliary stent exchange and fiducial placement by Dr. Meridee Score on August 08, 2023. She completed SBRT on 09/26/2023

## 2023-10-22 NOTE — Progress Notes (Signed)
[facility]   Telephone:([phone]) [phone] Fax:(336) [phone]   Clinic Follow up Note   Patient Care Team: [name], MD as PCP - General (Family Medicine) Malachy Mood, MD as Consulting Physician (Oncology)  Date of Service:  10/22/2023  CHIEF COMPLAINT: f/u of pancreatic cancer  CURRENT THERAPY:  Surveillance  Oncology History   Pancreatic cancer [facility]) -stage IB (T2, N0, Mx), diagnosed in April 2024 -Presented to the emergency room on 4/17 with jaundice and dark urine -Baseline bilirubin elevated at 8.6 -CT scan showed pancreatic head lesion with involvement of the distal CBD and associated biliary ductal dilation.  No evidence of lymphadenopathy or metastatic disease to the liver -Had ERCP and stent placement on 4/19 -Underwent EUS by Dr. Meridee Score on 04/08/23 showing 30 x 23 mm pancreatic head lesion without any vascular involvement.  -The final pathology showed pancreatic adenocarcinoma -She has met pancreatobiliary surgeon Dr. Donell Beers, and she declined the surgery.  Patient understands that her cancer is not curable without surgery. -I encouraged her to consider chemotherapy for 3 to 6 months, with consolidation radiation -Patient has decided to start chemotherapy gemcitabine and cisplatin every 2 weeks.  She started on 05/27/23, she has received a total of 4 doses of today. -plan to repeat CT on 8/26 -Her tumor marker CA 19.9 has dropped significantly since she started chemo, she is likely responding to treatment. -She underwent biliary stent exchange and fiducial placement by Dr. Meridee Score on August 08, 2023. She completed SBRT on 09/26/2023   Assessment and Plan    Pancreatic Cancer Follow-up for pancreatic cancer. Completed radiation on September 26, 2023. Persistent nausea and vomiting post-radiation, with symptoms worsening recently. Weight decreased from the 140s to 138 lbs. Current medications for nausea include Marinol and Compazine. Ondansetron  discontinued due to adverse effects. Phenergan available but not regularly used. Discussed potential use of olanzapine for appetite and nausea control, pending EKG results. CT scan planned to assess current status of cancer. Chemotherapy and radiation are unlikely to cure the cancer, and symptoms may persist due to the cancer itself. - Order EKG - Prescribe olanzapine 30 tablets, to be taken at night - Increase Ensure intake to two bottles per day if tolerated - Order CT scan within the next 2-3 weeks - Check tumor marker levels - Schedule follow-up phone visit in three weeks - Refer to palliative care nurse practitioner  Nausea and Vomiting Persistent nausea and vomiting, likely related to pancreatic cancer and post-radiation effects. Current management includes Marinol and Compazine. Phenergan available but not regularly used due to drowsiness. Discussed potential use of olanzapine for better control. Explained that olanzapine works in the brain to boost appetite and suppress nausea, and is also used for mood disorders. - Continue Marinol before breakfast and dinner - Continue Compazine as needed, up to three times a day - Use Phenergan during nap time if needed - Start olanzapine at night pending EKG results  Nutritional Deficiency Decreased oral intake due to nausea and vomiting. Weight decreased from the 140s to 138 lbs. Currently taking one bottle of Ensure daily. Discussed increasing Ensure intake for better nutrition. - Increase Ensure intake to two bottles per day if tolerated - Encourage consumption of easily digestible foods - Ensure adequate hydration to prevent constipation and dehydration  Blurry Vision Complaints of blurry vision and difficulty focusing. Needs evaluation for potential new glasses prescription. - Refer to ophthalmologist for evaluation and potential new glasses prescription  General Health Maintenance Discussed the importance of hydration and  nutrition. No  current issues with blood sugar or diarrhea, indicating pancreatic function is likely adequate. - Encourage adequate fluid intake to prevent dehydration and constipation - Use Gas-X for bloating and gas discomfort  Goals of Care Discussed prognosis and goals of care. Patient and family understand that chemotherapy and radiation are unlikely to cure the cancer. She is starting palliative care to focus on comfort and symptom management. Hospice care may be considered if she decides to stop cancer treatments. - Start palliative care with nurse practitioner - Consider hospice care if no further cancer treatments are desired.  Follow-up - Schedule CT scan within the next 2-3 weeks - Follow-up phone visit in three weeks - Coordinate with palliative care team for ongoing management.         SUMMARY OF ONCOLOGIC HISTORY: Oncology History Overview Note   Cancer Staging  Pancreatic cancer [facility]) Staging form: Exocrine Pancreas, AJCC 8th Edition - Clinical: Stage IB (cT2, cN0, cM0) - Signed by Heilingoetter, Cassandra L, PA-C on 04/12/2023 Total positive nodes: 0     Pancreatic cancer (HCC)  03/27/2023 Imaging   CT ABDOMEN PELVIS W CONTRAST   IMPRESSION: Low-attenuation mass centered along the pancreatic head with involvement of the distal common duct with the associated biliary duct ductal dilatation and pancreatic atrophy. Changes are worrisome for neoplasm either a pancreatic neoplasm or cholangiocarcinoma. Recommend further evaluation.   No liver metastases. No pathologically enlarged upper abdominal nodes.   Diffuse colonic stool.  Normal appendix.  Few diverticula.   Portions of the pelvis are obscured by the streak artifact from the bilateral hip arthroplasties.   03/29/2023 Imaging   DG ERCP   IMPRESSION: Intraoperative fluoroscopic images show dilated intra or extrahepatic bile ducts with high-grade stenosis at the level of the distal CBD. Biliary tree is decompressed  following placement of common bile duct stent.   04/08/2023 Pathology Results   CYTOLOGY - NON PAP  CASE: [id number]  PATIENT: Marie Ford  Non-Gynecological Cytology Report   FINAL MICROSCOPIC DIAGNOSIS:  A. PANCREAS, HEAD, FINE NEEDLE ASPIRATION:  - Malignant cells present (see Comment)      04/08/2023 Procedure   EUS by Dr. Meridee Score   EUS impression  - A mass was identified in the pancreatic head/ genu of the pancreas. Cytology results are pending. However, the endosonographic appearance is highly suspicious for adenocarcinoma. This was staged T2 N0 Mx by endosonographic criteria. The staging applies if malignancy is confirmed. Fine needle biopsy performed.  - There was dilation in the common bile duct and in the common hepatic duct which measured up to 8 mm.  - One stent was visualized endosonographically within the common bile duct.  - Hyperechoic material consistent with sludge was visualized endosonographically in the gallbladder.  - Two enlarged lymph nodes were visualized in the celiac region ( level 20) and porta hepatis region. Tissue has not been obtained. However, the endosonographic appearance is suggestive of benign inflammatory change based on the visualization endosonographically.   04/12/2023 Initial Diagnosis   Pancreatic cancer (HCC)   04/12/2023 Cancer Staging   Staging form: Exocrine Pancreas, AJCC 8th Edition - Clinical: Stage IB (cT2, cN0, cM0) - Signed by Heilingoetter, Cassandra L, PA-C on 04/12/2023 Total positive nodes: 0   04/12/2023 Tumor Marker   Patient's tumor was tested for the following markers: CA 19.9. Results of the tumor marker test revealed 512.   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  Report date is 05/03/2023.  The CancerNext-Expanded gene panel offered by [facility]) and includes sequencing, rearrangement, and RNA analysis for the following 71 genes:  AIP, ALK, APC, ATM, BAP1, BARD1,  BMPR1A, BRCA1, BRCA2, BRIP1, CDC73, CDH1, CDK4, CDKN1B, CDKN2A, CHEK2, DICER1, FH, FLCN, KIF1B, LZTR1,MAX, MEN1, MET, MLH1, MSH2, MSH6, MUTYH, NF1, NF2, NTHL1, PALB2, PHOX2B, PMS2, POT1, PRKAR1A, PTCH1, PTEN, RAD51C,RAD51D, RB1, RET, SDHA, SDHAF2, SDHB, SDHC, SDHD, SMAD4, SMARCA4, SMARCB1, SMARCE1, STK11, SUFU, TMEM127, TP53,TSC1, TSC2 and VHL (sequencing and deletion/duplication); AXIN2, CTNNA1, EGFR, EGLN1, HOXB13, KIT, MITF, MSH3, PDGFRA, POLD1 and POLE (sequencing only); EPCAM and GREM1 (deletion/duplication only).     05/27/2023 -  Chemotherapy   Patient is on Treatment Plan : PANCREATIC Abraxane D1,8,15 + Gemcitabine D1,8,15 q28d        Discussed the use of AI scribe software for clinical note transcription with the patient, who gave verbal consent to proceed.  History of Present Illness   A 77 year old patient with a history of pancreatic cancer presents with persistent nausea and vomiting. She reports that these symptoms have been ongoing for over a month and have recently progressed to include vomiting. The patient has been managing her symptoms with Marinol and Compazine, but she has been persistent. She also reports experiencing blurry vision in both eyes, which she describes as a recent development.  The patient has been experiencing weight loss, dropping from the 140s to 138 pounds within the past month. To supplement her diet, she has been consuming at least one bottle of Ensure daily. She reports that she has been able to eat more food in the past couple of days, but overall, her food intake has been limited due to her symptoms.  The patient's daughter is actively involved in her care and is present during the consultation. The patient is due to start palliative care soon and is currently managing her symptoms at home.         All other systems were reviewed with the patient and are negative.  MEDICAL HISTORY:  Past Medical History:  Diagnosis Date   Chronic kidney disease     stage 3    Chronic sphenoidal sinusitis    Degenerative arthritis of hip    requirde surgery 2009   Depression 2010   Diplopia 2011   in left lateral gaze    Diplopia    " my eyes dont track together, i have corrective prisms in my eye glasses"    Diverticulosis 2006   noted in a colonoscopy   Esophageal reflux 2013   Hypertension    IGT (impaired glucose tolerance) 2011   denies hx of diabetes    Insomnia 2009   Medical history non-contributory    Mixed hyperlipidemia 2000   Osteoarthritis    Osteopenia 2005   Other and unspecified hyperlipidemia 2000   Other specified disease of sebaceous glands    Palpitations 2009   secondary to PSVT   Parkinson disease (HCC)    PNA (pneumonia) 2014   Psoriasis 2004   PSVT (paroxysmal supraventricular tachycardia) (HCC) 2009   "report it has been some time since i had that "    Situational stress    Urinary tract infection    thru wed 05/15/23, completed 3d antibiotics   Venous insufficiency since 2017    SURGICAL HISTORY: Past Surgical History:  Procedure Laterality Date   BILIARY BRUSHING  03/29/2023   Procedure: BILIARY BRUSHING;  Surgeon: Hilarie Fredrickson, MD;  Location: [facility] ENDOSCOPY;  Service: Gastroenterology;;   BILIARY DILATION  08/08/2023  Procedure: BILIARY DILATION;  Surgeon: Meridee Score Netty Starring., MD;  Location: [facility];  Service: Gastroenterology;;   BILIARY STENT PLACEMENT  03/29/2023   Procedure: BILIARY STENT PLACEMENT;  Surgeon: Hilarie Fredrickson, MD;  Location: [facility] ENDOSCOPY;  Service: Gastroenterology;;   BILIARY STENT PLACEMENT N/A 08/08/2023   Procedure: BILIARY STENT PLACEMENT;  Surgeon: Lemar Lofty., MD;  Location: [facility] ENDOSCOPY;  Service: Gastroenterology;  Laterality: N/A;   BIOPSY  04/08/2023   Procedure: BIOPSY;  Surgeon: Meridee Score Netty Starring., MD;  Location: [facility]COPY;  Service: Gastroenterology;;   CATARACT EXTRACTION W/ INTRAOCULAR LENS  IMPLANT, BILATERAL     COLONOSCOPY  01/30/2010    Jarold Motto   ENDOSCOPIC RETROGRADE CHOLANGIOPANCREATOGRAPHY (ERCP) WITH PROPOFOL N/A 08/08/2023   Procedure: ENDOSCOPIC RETROGRADE CHOLANGIOPANCREATOGRAPHY (ERCP) WITH PROPOFOL;  Surgeon: Lemar Lofty., MD;  Location: [facility];  Service: Gastroenterology;  Laterality: N/A;   ERCP N/A 03/29/2023   Procedure: ENDOSCOPIC RETROGRADE CHOLANGIOPANCREATOGRAPHY (ERCP);  Surgeon: Hilarie Fredrickson, MD;  Location: [facility] ENDOSCOPY;  Service: Gastroenterology;  Laterality: N/A;   ESOPHAGOGASTRODUODENOSCOPY N/A 08/08/2023   Procedure: ESOPHAGOGASTRODUODENOSCOPY (EGD);  Surgeon: Lemar Lofty., MD;  Location: [facility];  Service: Gastroenterology;  Laterality: N/A;   ESOPHAGOGASTRODUODENOSCOPY (EGD) WITH PROPOFOL N/A 04/08/2023   Procedure: ESOPHAGOGASTRODUODENOSCOPY (EGD) WITH PROPOFOL;  Surgeon: Meridee Score Netty Starring., MD;  Location: [facility] ENDOSCOPY;  Service: Gastroenterology;  Laterality: N/A;   ETHMOIDECTOMY Left 11/17/2020   Procedure: LEFT SIDED TOTAL ETHMOIDECTOMY;  Surgeon: Drema Halon, MD;  Location: [facility];  Service: ENT;  Laterality: Left;   EUS N/A 04/08/2023   Procedure: UPPER ENDOSCOPIC ULTRASOUND (EUS) LINEAR;  Surgeon: Lemar Lofty., MD;  Location: [facility] ENDOSCOPY;  Service: Gastroenterology;  Laterality: N/A;   EUS N/A 08/08/2023   Procedure: UPPER ENDOSCOPIC ULTRASOUND (EUS) LINEAR;  Surgeon: Lemar Lofty., MD;  Location: [facility] ENDOSCOPY;  Service: Gastroenterology;  Laterality: N/A;   EYE SURGERY     FIDUCIAL MARKER PLACEMENT  08/08/2023   Procedure: FIDUCIAL MARKER PLACEMENT;  Surgeon: Lemar Lofty., MD;  Location: [facility];  Service: Gastroenterology;;   FINE NEEDLE ASPIRATION N/A 04/08/2023   Procedure: FINE NEEDLE ASPIRATION (FNA) LINEAR;  Surgeon: Lemar Lofty., MD;  Location: [facility];  Service: Gastroenterology;  Laterality: N/A;   HEMORRHOID SURGERY     IR IMAGING GUIDED PORT INSERTION  05/17/2023   JOINT  REPLACEMENT     LASIK     left medial and superior rectus muscle recesion for diplopia correction     REMOVAL OF STONES  08/08/2023   Procedure: REMOVAL OF SLUDGE;  Surgeon: Meridee Score Netty Starring., MD;  Location: [facility];  Service: Gastroenterology;;   SINUS ENDO WITH FUSION Left 11/17/2020   Procedure: SINUS ENDOSCOPY WITH FUSION NAVIGATION;  Surgeon: Drema Halon, MD;  Location: [facility];  Service: ENT;  Laterality: Left;   SPHENOIDECTOMY Left 11/17/2020   Procedure: LEFT SIDED SPHENOIDECTOMY WITH TISSUE REMOVAL;  Surgeon: Drema Halon, MD;  Location: [facility];  Service: ENT;  Laterality: Left;   SPHINCTEROTOMY  03/29/2023   Procedure: SPHINCTEROTOMY;  Surgeon: Hilarie Fredrickson, MD;  Location: [facility] ENDOSCOPY;  Service: Gastroenterology;;   Francine Graven REMOVAL  08/08/2023   Procedure: STENT REMOVAL;  Surgeon: Lemar Lofty., MD;  Location: [facility] ENDOSCOPY;  Service: Gastroenterology;;   surgery of right eye rectus muscles for diplpia correction   2019   TOTAL HIP ARTHROPLASTY Right 2010   TOTAL HIP ARTHROPLASTY Left 08/19/2019   Procedure: TOTAL HIP ARTHROPLASTY ANTERIOR APPROACH;  Surgeon: Ollen Gross, MD;  Location: [facility];  Service: Orthopedics;  Laterality: Left;    TURBINATE REDUCTION Left 11/17/2020   Procedure: LEFT SIDED TURBINATE REDUCTION;  Surgeon: Drema Halon, MD;  Location: [facility];  Service: ENT;  Laterality: Left;   URETHRAL SLING     midurethral sling with TVT Exact and Cystoscopy    I have reviewed the social history and family history with the patient and they are unchanged from previous note.  ALLERGIES:  is allergic to erythromycin, macrobid [nitrofurantoin], keflex [cephalexin], levofloxacin, penicillins, and sulfamethoxazole-trimethoprim.  MEDICATIONS:  Current Outpatient Medications  Medication Sig Dispense Refill   OLANZapine (ZYPREXA) 10 MG tablet Take 1 tablet (10 mg total) by  mouth at bedtime. 30 tablet 0   ALPRAZolam (XANAX) 0.5 MG tablet Take 0.25 mg by mouth as needed for anxiety. Takes as needed for sleep     AUVELITY 45-105 MG TBCR Take 1 tablet by mouth every evening.     buPROPion (WELLBUTRIN XL) 150 MG 24 hr tablet Take 150 mg by mouth daily.     Calcium Carbonate Antacid (CALCIUM CARBONATE PO) Take by mouth.     carbidopa-levodopa (SINEMET CR) 50-200 MG tablet Take 1 tablet by mouth at bedtime. 90 tablet 3   carbidopa-levodopa (SINEMET IR) 25-100 MG tablet Take 1.5 tablets by mouth 4 (four) times daily. -Try to separate Sinemet from food (especially protein-rich foods like meat, dairy, eggs) by about 30-60 mins - this will help the absorption of the medication. If you have some nausea with the medication, you can take it with some light food like crackers or ginger ale. Try taking at 7am, 10am, 1pm and 4pm and then at bedtime take the extended release(Sinemet CR) 540 tablet 4   donepezil (ARICEPT) 10 MG tablet Take 1 tablet (10 mg total) by mouth at bedtime. 30 tablet 5   dronabinol (MARINOL) 2.5 MG capsule Take 1 capsule (2.5 mg total) by mouth 2 (two) times daily before a meal. Ok to refill on 08/06/2023 60 capsule 0   lidocaine-prilocaine (EMLA) cream Apply to affected area once 30 g 3   losartan (COZAAR) 50 MG tablet Take 1 tablet (50 mg total) by mouth daily. 90 tablet 1   magic mouthwash (multi-ingredient) oral suspension Take 5 mLs by mouth 4 (four) times daily. 240 mL 1   Multiple Vitamin (MULTIVITAMIN WITH MINERALS) TABS tablet Take 1 tablet by mouth daily. Women's Multivitamin     OVER THE COUNTER MEDICATION Take 1 tablet by mouth daily as needed (constipation).     pantoprazole (PROTONIX) 40 MG tablet Take 40 mg by mouth daily.     Polyethyl Glycol-Propyl Glycol (SYSTANE) 0.4-0.3 % SOLN Place 1 drop into both eyes 3 (three) times daily as needed (dry/irritated eyes.).     polyethylene glycol (MIRALAX / GLYCOLAX) 17 g packet Take 17 g by mouth daily.      pravastatin (PRAVACHOL) 80 MG tablet Take 1 tablet (80 mg total) by mouth every evening.     prochlorperazine (COMPAZINE) 10 MG tablet Take 1 tablet (10 mg total) by mouth every 6 (six) hours as needed for nausea or vomiting. 30 tablet 1   promethazine (PHENERGAN) 25 MG tablet Take 25 mg by mouth every 6 (six) hours as needed for nausea or vomiting.     triamcinolone (NASACORT) 55 MCG/ACT AERO nasal inhaler Place 2 sprays into the nose daily. Use at night (Patient taking differently: Place 1 spray into the nose daily. Use at  night) 1 each 12   No current facility-administered medications for this visit.    PHYSICAL EXAMINATION: ECOG PERFORMANCE STATUS: 2 - Symptomatic, <50% confined to bed  Vitals:   10/22/23 1442 10/22/23 1443  BP: (!) 177/87 (!) 178/95  Pulse: 71   Resp: 17   Temp: 97.7 F (36.5 C)   SpO2: 98%    Wt Readings from Last 3 Encounters:  10/22/23 137 lb 12.8 oz (62.5 kg)  10/18/23 135 lb 14.4 oz (61.6 kg)  10/02/23 139 lb (63 kg)     GENERAL:alert, no distress and comfortable SKIN: skin color, texture, turgor are normal, no rashes or significant lesions EYES: normal, Conjunctiva are pink and non-injected, sclera clear NECK: supple, thyroid normal size, non-tender, without nodularity LYMPH:  no palpable lymphadenopathy in the cervical, axillary  LUNGS: clear to auscultation and percussion with normal breathing effort HEART: regular rate & rhythm and no murmurs and no lower extremity edema ABDOMEN:abdomen soft, non-tender and normal bowel sounds Musculoskeletal:no cyanosis of digits and no clubbing  NEURO: alert & oriented x 3 with fluent speech, no focal motor/sensory deficits   LABORATORY DATA:  I have reviewed the data as listed    Latest Ref Rng & Units 10/18/2023   10:38 AM 09/05/2023   11:57 AM 08/14/2023    8:37 AM  CBC  WBC 4.0 - 10.5 K/uL 5.7  6.1  5.3   Hemoglobin 12.0 - 15.0 g/dL 47.8  29.5  62.1   Hematocrit 36.0 - 46.0 % 39.2  37.5  36.7    Platelets 150 - 400 K/uL 265  385  286         Latest Ref Rng & Units 10/18/2023   10:38 AM 09/05/2023   11:57 AM 08/14/2023    8:37 AM  CMP  Glucose 70 - 99 mg/dL 308  657  846   BUN 8 - 23 mg/dL 22  22  18    Creatinine 0.44 - 1.00 mg/dL 9.62  9.52  8.41   Sodium 135 - 145 mmol/L 139  140  141   Potassium 3.5 - 5.1 mmol/L 3.6  3.5  3.4   Chloride 98 - 111 mmol/L 104  105  105   CO2 22 - 32 mmol/L 28  28  28    Calcium 8.9 - 10.3 mg/dL 9.7  9.1  9.3   Total Protein 6.5 - 8.1 g/dL 7.0  6.7  6.6   Total Bilirubin <1.2 mg/dL 0.9  0.7  1.0   Alkaline Phos 38 - 126 U/L 67  73  88   AST 15 - 41 U/L 15  15  15    ALT 0 - 44 U/L <5  <5  <5       RADIOGRAPHIC STUDIES: I have personally reviewed the radiological images as listed and agreed with the findings in the report. No results found.    Orders Placed This Encounter  Procedures   CT ABDOMEN PELVIS W CONTRAST    Standing Status:   Future    Standing Expiration Date:   10/21/2024    Order Specific Question:   If indicated for the ordered procedure, I authorize the administration of contrast media per Radiology protocol    Answer:   Yes    Order Specific Question:   Does the patient have a contrast media/X-ray dye allergy?    Answer:   No    Order Specific Question:   Preferred imaging location?    Answer:   [facility]  Order Specific Question:   If indicated for the ordered procedure, I authorize the administration of oral contrast media per Radiology protocol    Answer:   Yes   EKG 12-Lead    Ordered by an unspecified provider    All questions were answered. The patient knows to call the clinic with any problems, questions or concerns. No barriers to learning was detected. The total time spent in the appointment was 30 minutes.     Malachy Mood, MD 10/22/2023

## 2023-10-23 LAB — CANCER ANTIGEN 19-9: CA 19-9: 5 U/mL (ref 0–35)

## 2023-10-24 ENCOUNTER — Other Ambulatory Visit: Payer: Self-pay

## 2023-10-25 ENCOUNTER — Telehealth: Payer: Self-pay

## 2023-10-25 NOTE — Telephone Encounter (Signed)
Notified Patient of prior authorization approval for Olanzapine 10 mg Tablets. Medication is approved through 12/09/2024. No other needs or concerns voiced at this time.

## 2023-11-04 ENCOUNTER — Ambulatory Visit (HOSPITAL_COMMUNITY)
Admission: RE | Admit: 2023-11-04 | Discharge: 2023-11-04 | Disposition: A | Discharge status: Home / Self Care | Payer: Medicare PPO | Source: Ambulatory Visit | Attending: Hematology | Admitting: Hematology

## 2023-11-04 DIAGNOSIS — C250 Malignant neoplasm of head of pancreas: Secondary | ICD-10-CM | POA: Insufficient documentation

## 2023-11-04 MED ORDER — IOHEXOL 300 MG/ML  SOLN
100.0000 mL | Freq: Once | INTRAMUSCULAR | Status: AC | PRN
  Administered 2023-11-04: 100 mL via INTRAVENOUS

## 2023-11-05 ENCOUNTER — Telehealth: Payer: Self-pay

## 2023-11-05 NOTE — Telephone Encounter (Signed)
Yes we can stop the aricept thanks

## 2023-11-05 NOTE — Telephone Encounter (Signed)
Patient called in to the new patient referral line. When I answered the phone, she proceeded to leave a voicemail with me and hang up the phone before I could ask any further questions. She stated that one of her doctors was wanting her to discontinue her donepezil (ARICEPT) 10 MG tablet?  I tried reaching out to get clarification but had to leave a vm.

## 2023-11-05 NOTE — Telephone Encounter (Signed)
Pt called and stated that she would like the nurse to call her back to discuss her medications due to her provider wanting to cut back on some of them. Please advise.

## 2023-11-05 NOTE — Telephone Encounter (Signed)
I spoke with the patient.  She states the palliative care nurse came by for an evaluation and she recommended since the patient is on a lot of medications, if they are not urgent, can they be stopped.  The Aricept was a medication that was mentioned.  The patient would like a MyChart message back or call once Dr. [name] has had time to review this request.

## 2023-11-11 ENCOUNTER — Other Ambulatory Visit: Payer: Self-pay | Admitting: Hematology

## 2023-11-12 ENCOUNTER — Encounter: Payer: Self-pay | Admitting: Hematology

## 2023-11-12 ENCOUNTER — Inpatient Hospital Stay: Payer: Medicare PPO | Attending: Physician Assistant | Admitting: Hematology

## 2023-11-12 DIAGNOSIS — Z923 Personal history of irradiation: Secondary | ICD-10-CM | POA: Insufficient documentation

## 2023-11-12 DIAGNOSIS — R112 Nausea with vomiting, unspecified: Secondary | ICD-10-CM | POA: Insufficient documentation

## 2023-11-12 DIAGNOSIS — Z9221 Personal history of antineoplastic chemotherapy: Secondary | ICD-10-CM | POA: Insufficient documentation

## 2023-11-12 DIAGNOSIS — C250 Malignant neoplasm of head of pancreas: Secondary | ICD-10-CM | POA: Diagnosis not present

## 2023-11-12 DIAGNOSIS — G20A1 Parkinson's disease without dyskinesia, without mention of fluctuations: Secondary | ICD-10-CM | POA: Insufficient documentation

## 2023-11-12 DIAGNOSIS — R1011 Right upper quadrant pain: Secondary | ICD-10-CM | POA: Insufficient documentation

## 2023-11-12 NOTE — Progress Notes (Signed)
[facility]   [phone] [phone]   Clinic Follow up Note   Patient Care Team: [name], MD as PCP - General (Family Medicine) Malachy Mood, MD as Consulting Physician (Oncology) 11/12/2023  I connected with Marie Ford on 11/12/23 at  1:00 PM EST by telephone and verified that I am speaking with the correct person using two identifiers.   I discussed the limitations, risks, security and privacy concerns of performing an evaluation and management service by telephone and the availability of in person appointments. I also discussed with the patient that there may be a patient responsible charge related to this service. The patient expressed understanding and agreed to proceed.   Patient's location:  Home  Provider's location:  Office    CHIEF COMPLAINT: Follow-up pancreatic cancer   CURRENT THERAPY: Observation  Oncology history Pancreatic cancer (HCC) -stage IB (T2, N0, Mx), diagnosed in April 2024 -Presented to the emergency room on 4/17 with jaundice and dark urine -Baseline bilirubin elevated at 8.6 -CT scan showed pancreatic head lesion with involvement of the distal CBD and associated biliary ductal dilation.  No evidence of lymphadenopathy or metastatic disease to the liver -Had ERCP and stent placement on 4/19 -Underwent EUS by Dr. Meridee Score on 04/08/23 showing 30 x 23 mm pancreatic head lesion without any vascular involvement.  -The final pathology showed pancreatic adenocarcinoma -She has met pancreatobiliary surgeon Dr. Donell Beers, and she declined the surgery.  Patient understands that her cancer is not curable without surgery. -I encouraged her to consider chemotherapy for 3 to 6 months, with consolidation radiation -Patient has decided to start chemotherapy gemcitabine and cisplatin every 2 weeks.  She started on 05/27/23, she has received a total of 4 doses of today. -plan to repeat CT on 8/26 -Her tumor marker CA 19.9 has  dropped significantly since she started chemo, she is likely responding to treatment. -She underwent biliary stent exchange and fiducial placement by Dr. Meridee Score on August 08, 2023. She completed SBRT on 09/26/2023  Assessment and Plan    Pancreatic Cancer Recent CT scan shows significant improvement with no visible pancreatic mass and no new metastases. Symptoms, including nausea and tremors, have improved, and eating has normalized. Abdominal lymph node remains unchanged. Tumor markers are normal. No current cancer-related pain. Explained high recurrence risk and future treatment will depend on recurrence location and overall health. - Monitor tumor markers and CT scan in 3-4 months - No further chemotherapy planned at this time - Encourage increased food intake and physical activity to aid recovery -We again discussed her that her cancer is unlikely cured, and it would likely progress at some point down the road.  Venous Thromboembolism Blood clot size has decreased per recent CT scan. Continued monitoring is necessary. - Continue monitoring the blood clot  General Health Maintenance Advised to maintain a healthy diet and stay active, especially during the holiday season, to aid in weight gain and overall recovery. - Encourage increased food intake and physical activity - Monitor weight and nutritional intake  Plan -Restaging CT scan reviewed, which showed excellent response to radiation. - Schedule follow-up appointment for port flush and lab work in the second week of January - Monitor tumor markers and perform CT scan in 3-4 months.         SUMMARY OF ONCOLOGIC HISTORY: Oncology History Overview Note   Cancer Staging  Pancreatic cancer [facility]) Staging form: Exocrine Pancreas, AJCC 8th Edition - Clinical: Stage IB (cT2, cN0, cM0) - Signed  by Heilingoetter, Cassandra L, PA-C on 04/12/2023 Total positive nodes: 0     Pancreatic cancer (HCC)  03/27/2023 Imaging   CT ABDOMEN  PELVIS W CONTRAST   IMPRESSION: Low-attenuation mass centered along the pancreatic head with involvement of the distal common duct with the associated biliary duct ductal dilatation and pancreatic atrophy. Changes are worrisome for neoplasm either a pancreatic neoplasm or cholangiocarcinoma. Recommend further evaluation.   No liver metastases. No pathologically enlarged upper abdominal nodes.   Diffuse colonic stool.  Normal appendix.  Few diverticula.   Portions of the pelvis are obscured by the streak artifact from the bilateral hip arthroplasties.   03/29/2023 Imaging   DG ERCP   IMPRESSION: Intraoperative fluoroscopic images show dilated intra or extrahepatic bile ducts with high-grade stenosis at the level of the distal CBD. Biliary tree is decompressed following placement of common bile duct stent.   04/08/2023 Pathology Results   CYTOLOGY - NON PAP  CASE: [id number]  PATIENT: Marie Ford  Non-Gynecological Cytology Report   FINAL MICROSCOPIC DIAGNOSIS:  A. PANCREAS, HEAD, FINE NEEDLE ASPIRATION:  - Malignant cells present (see Comment)      04/08/2023 Procedure   EUS by Dr. Meridee Score   EUS impression  - A mass was identified in the pancreatic head/ genu of the pancreas. Cytology results are pending. However, the endosonographic appearance is highly suspicious for adenocarcinoma. This was staged T2 N0 Mx by endosonographic criteria. The staging applies if malignancy is confirmed. Fine needle biopsy performed.  - There was dilation in the common bile duct and in the common hepatic duct which measured up to 8 mm.  - One stent was visualized endosonographically within the common bile duct.  - Hyperechoic material consistent with sludge was visualized endosonographically in the gallbladder.  - Two enlarged lymph nodes were visualized in the celiac region ( level 20) and porta hepatis region. Tissue has not been obtained. However, the endosonographic appearance is  suggestive of benign inflammatory change based on the visualization endosonographically.   04/12/2023 Initial Diagnosis   Pancreatic cancer (HCC)   04/12/2023 Cancer Staging   Staging form: Exocrine Pancreas, AJCC 8th Edition - Clinical: Stage IB (cT2, cN0, cM0) - Signed by Heilingoetter, Cassandra L, PA-C on 04/12/2023 Total positive nodes: 0   04/12/2023 Tumor Marker   Patient's tumor was tested for the following markers: CA 19.9. Results of the tumor marker test revealed 512.   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  Report date is 05/03/2023.   The CancerNext-Expanded gene panel offered by [facility] and includes sequencing, rearrangement, and RNA analysis for the following 71 genes:  AIP, ALK, APC, ATM, BAP1, BARD1, BMPR1A, BRCA1, BRCA2, BRIP1, CDC73, CDH1, CDK4, CDKN1B, CDKN2A, CHEK2, DICER1, FH, FLCN, KIF1B, LZTR1,MAX, MEN1, MET, MLH1, MSH2, MSH6, MUTYH, NF1, NF2, NTHL1, PALB2, PHOX2B, PMS2, POT1, PRKAR1A, PTCH1, PTEN, RAD51C,RAD51D, RB1, RET, SDHA, SDHAF2, SDHB, SDHC, SDHD, SMAD4, SMARCA4, SMARCB1, SMARCE1, STK11, SUFU, TMEM127, TP53,TSC1, TSC2 and VHL (sequencing and deletion/duplication); AXIN2, CTNNA1, EGFR, EGLN1, HOXB13, KIT, MITF, MSH3, PDGFRA, POLD1 and POLE (sequencing only); EPCAM and GREM1 (deletion/duplication only).     05/27/2023 -  Chemotherapy   Patient is on Treatment Plan : PANCREATIC Abraxane D1,8,15 + Gemcitabine D1,8,15 q28d       Discussed the use of AI scribe software for clinical note transcription with the patient, who gave verbal consent to proceed.  History of Present Illness   Marie Ford, a 77 year old female with a known diagnosis of pancreatic cancer,  reports feeling significantly better over the past three weeks. She states that she is starting to feel like a human being again, indicating an improvement in her overall well-being. She reports that her tremors have subsided and her nausea has significantly  diminished. She is eating more regularly now, with a diet consisting of fruit, cereal, midday meals, and ice cream. Despite these improvements, she has not gained any weight and her weight remains stable at around 133-134 pounds. She denies experiencing any pain, although she mentions some soreness in her back, which she does not attribute to her cancer treatment.         REVIEW OF SYSTEMS:   Constitutional: Denies fevers, chills or abnormal weight loss Eyes: Denies blurriness of vision Ears, nose, mouth, throat, and face: Denies mucositis or sore throat Respiratory: Denies cough, dyspnea or wheezes Cardiovascular: Denies palpitation, chest discomfort or lower extremity swelling Gastrointestinal:  Denies nausea, heartburn or change in bowel habits Skin: Denies abnormal skin rashes Lymphatics: Denies new lymphadenopathy or easy bruising Neurological:Denies numbness, tingling or new weaknesses Behavioral/Psych: Mood is stable, no new changes  All other systems were reviewed with the patient and are negative.  MEDICAL HISTORY:  Past Medical History:  Diagnosis Date   Chronic kidney disease    stage 3    Chronic sphenoidal sinusitis    Degenerative arthritis of hip    requirde surgery 2009   Depression 2010   Diplopia 2011   in left lateral gaze    Diplopia    " my eyes dont track together, i have corrective prisms in my eye glasses"    Diverticulosis 2006   noted in a colonoscopy   Esophageal reflux 2013   Hypertension    IGT (impaired glucose tolerance) 2011   denies hx of diabetes    Insomnia 2009   Medical history non-contributory    Mixed hyperlipidemia 2000   Osteoarthritis    Osteopenia 2005   Other and unspecified hyperlipidemia 2000   Other specified disease of sebaceous glands    Palpitations 2009   secondary to PSVT   Parkinson disease (HCC)    PNA (pneumonia) 2014   Psoriasis 2004   PSVT (paroxysmal supraventricular tachycardia) (HCC) 2009   "report it has  been some time since i had that "    Situational stress    Urinary tract infection    thru wed 05/15/23, completed 3d antibiotics   Venous insufficiency since 2017    SURGICAL HISTORY: Past Surgical History:  Procedure Laterality Date   BILIARY BRUSHING  03/29/2023   Procedure: BILIARY BRUSHING;  Surgeon: Hilarie Fredrickson, MD;  Location: [facility] ENDOSCOPY;  Service: Gastroenterology;;   BILIARY DILATION  08/08/2023   Procedure: BILIARY DILATION;  Surgeon: Lemar Lofty., MD;  Location: [facility] ENDOSCOPY;  Service: Gastroenterology;;   BILIARY STENT PLACEMENT  03/29/2023   Procedure: BILIARY STENT PLACEMENT;  Surgeon: Hilarie Fredrickson, MD;  Location: [facility] ENDOSCOPY;  Service: Gastroenterology;;   BILIARY STENT PLACEMENT N/A 08/08/2023   Procedure: BILIARY STENT PLACEMENT;  Surgeon: Lemar Lofty., MD;  Location: [facility] ENDOSCOPY;  Service: Gastroenterology;  Laterality: N/A;   BIOPSY  04/08/2023   Procedure: BIOPSY;  Surgeon: Meridee Score Netty Starring., MD;  Location: [facility] ENDOSCOPY;  Service: Gastroenterology;;   CATARACT EXTRACTION W/ INTRAOCULAR LENS  IMPLANT, BILATERAL     COLONOSCOPY  01/30/2010   Jarold Motto   ENDOSCOPIC RETROGRADE CHOLANGIOPANCREATOGRAPHY (ERCP) WITH PROPOFOL N/A 08/08/2023   Procedure: ENDOSCOPIC RETROGRADE CHOLANGIOPANCREATOGRAPHY (ERCP) WITH PROPOFOL;  Surgeon: Lemar Lofty.,  MD;  Location: [facility] ENDOSCOPY;  Service: Gastroenterology;  Laterality: N/A;   ERCP N/A 03/29/2023   Procedure: ENDOSCOPIC RETROGRADE CHOLANGIOPANCREATOGRAPHY (ERCP);  Surgeon: Hilarie Fredrickson, MD;  Location: [facility] ENDOSCOPY;  Service: Gastroenterology;  Laterality: N/A;   ESOPHAGOGASTRODUODENOSCOPY N/A 08/08/2023   Procedure: ESOPHAGOGASTRODUODENOSCOPY (EGD);  Surgeon: Lemar Lofty., MD;  Location: [facility] ENDOSCOPY;  Service: Gastroenterology;  Laterality: N/A;   ESOPHAGOGASTRODUODENOSCOPY (EGD) WITH PROPOFOL N/A 04/08/2023   Procedure: ESOPHAGOGASTRODUODENOSCOPY (EGD) WITH PROPOFOL;  Surgeon:  Meridee Score Netty Starring., MD;  Location: [facility] ENDOSCOPY;  Service: Gastroenterology;  Laterality: N/A;   ETHMOIDECTOMY Left 11/17/2020   Procedure: LEFT SIDED TOTAL ETHMOIDECTOMY;  Surgeon: Drema Halon, MD;  Location: [facility];  Service: ENT;  Laterality: Left;   EUS N/A 04/08/2023   Procedure: UPPER ENDOSCOPIC ULTRASOUND (EUS) LINEAR;  Surgeon: Lemar Lofty., MD;  Location: [facility] ENDOSCOPY;  Service: Gastroenterology;  Laterality: N/A;   EUS N/A 08/08/2023   Procedure: UPPER ENDOSCOPIC ULTRASOUND (EUS) LINEAR;  Surgeon: Lemar Lofty., MD;  Location: [facility] ENDOSCOPY;  Service: Gastroenterology;  Laterality: N/A;   EYE SURGERY     FIDUCIAL MARKER PLACEMENT  08/08/2023   Procedure: FIDUCIAL MARKER PLACEMENT;  Surgeon: Lemar Lofty., MD;  Location: [facility] ENDOSCOPY;  Service: Gastroenterology;;   FINE NEEDLE ASPIRATION N/A 04/08/2023   Procedure: FINE NEEDLE ASPIRATION (FNA) LINEAR;  Surgeon: Lemar Lofty., MD;  Location: [facility] ENDOSCOPY;  Service: Gastroenterology;  Laterality: N/A;   HEMORRHOID SURGERY     IR IMAGING GUIDED PORT INSERTION  05/17/2023   JOINT REPLACEMENT     LASIK     left medial and superior rectus muscle recesion for diplopia correction     REMOVAL OF STONES  08/08/2023   Procedure: REMOVAL OF SLUDGE;  Surgeon: Meridee Score Netty Starring., MD;  Location: [facility] ENDOSCOPY;  Service: Gastroenterology;;   SINUS ENDO WITH FUSION Left 11/17/2020   Procedure: SINUS ENDOSCOPY WITH FUSION NAVIGATION;  Surgeon: Drema Halon, MD;  Location: [facility];  Service: ENT;  Laterality: Left;   SPHENOIDECTOMY Left 11/17/2020   Procedure: LEFT SIDED SPHENOIDECTOMY WITH TISSUE REMOVAL;  Surgeon: Drema Halon, MD;  Location: [facility];  Service: ENT;  Laterality: Left;   SPHINCTEROTOMY  03/29/2023   Procedure: SPHINCTEROTOMY;  Surgeon: Hilarie Fredrickson, MD;  Location: [facility] ENDOSCOPY;  Service: Gastroenterology;;   Francine Graven  REMOVAL  08/08/2023   Procedure: STENT REMOVAL;  Surgeon: Lemar Lofty., MD;  Location: [facility] ENDOSCOPY;  Service: Gastroenterology;;   surgery of right eye rectus muscles for diplpia correction   2019   TOTAL HIP ARTHROPLASTY Right 2010   TOTAL HIP ARTHROPLASTY Left 08/19/2019   Procedure: TOTAL HIP ARTHROPLASTY ANTERIOR APPROACH;  Surgeon: Ollen Gross, MD;  Location: [facility] ORS;  Service: Orthopedics;  Laterality: Left;    TURBINATE REDUCTION Left 11/17/2020   Procedure: LEFT SIDED TURBINATE REDUCTION;  Surgeon: Drema Halon, MD;  Location: [facility];  Service: ENT;  Laterality: Left;   URETHRAL SLING     midurethral sling with TVT Exact and Cystoscopy    I have reviewed the social history and family history with the patient and they are unchanged from previous note.  ALLERGIES:  is allergic to erythromycin, macrobid [nitrofurantoin], keflex [cephalexin], levofloxacin, penicillins, and sulfamethoxazole-trimethoprim.  MEDICATIONS:  Current Outpatient Medications  Medication Sig Dispense Refill   ALPRAZolam (XANAX) 0.5 MG tablet Take 0.25 mg by mouth as needed for anxiety. Takes as needed for sleep     AUVELITY  45-105 MG TBCR Take 1 tablet by mouth every evening.     buPROPion (WELLBUTRIN XL) 150 MG 24 hr tablet Take 150 mg by mouth daily.     Calcium Carbonate Antacid (CALCIUM CARBONATE PO) Take by mouth.     carbidopa-levodopa (SINEMET CR) 50-200 MG tablet Take 1 tablet by mouth at bedtime. 90 tablet 3   carbidopa-levodopa (SINEMET IR) 25-100 MG tablet Take 1.5 tablets by mouth 4 (four) times daily. -Try to separate Sinemet from food (especially protein-rich foods like meat, dairy, eggs) by about 30-60 mins - this will help the absorption of the medication. If you have some nausea with the medication, you can take it with some light food like crackers or ginger ale. Try taking at 7am, 10am, 1pm and 4pm and then at bedtime take the extended release(Sinemet  CR) 540 tablet 4   donepezil (ARICEPT) 10 MG tablet Take 1 tablet (10 mg total) by mouth at bedtime. 30 tablet 5   dronabinol (MARINOL) 2.5 MG capsule TAKE 1 CAPSULE BY MOUTH TWICE DAILY DAILY BEFORE A MEAL *REFRIGERATE* *DO NOT FILL UNTIL 8/27* 60 capsule 0   lidocaine-prilocaine (EMLA) cream Apply to affected area once 30 g 3   losartan (COZAAR) 50 MG tablet Take 1 tablet (50 mg total) by mouth daily. 90 tablet 1   magic mouthwash (multi-ingredient) oral suspension Take 5 mLs by mouth 4 (four) times daily. 240 mL 1   Multiple Vitamin (MULTIVITAMIN WITH MINERALS) TABS tablet Take 1 tablet by mouth daily. Women's Multivitamin     OLANZapine (ZYPREXA) 10 MG tablet Take 1 tablet (10 mg total) by mouth at bedtime. 30 tablet 0   OVER THE COUNTER MEDICATION Take 1 tablet by mouth daily as needed (constipation).     pantoprazole (PROTONIX) 40 MG tablet Take 40 mg by mouth daily.     Polyethyl Glycol-Propyl Glycol (SYSTANE) 0.4-0.3 % SOLN Place 1 drop into both eyes 3 (three) times daily as needed (dry/irritated eyes.).     polyethylene glycol (MIRALAX / GLYCOLAX) 17 g packet Take 17 g by mouth daily.     pravastatin (PRAVACHOL) 80 MG tablet Take 1 tablet (80 mg total) by mouth every evening.     prochlorperazine (COMPAZINE) 10 MG tablet Take 1 tablet (10 mg total) by mouth every 6 (six) hours as needed for nausea or vomiting. 30 tablet 1   promethazine (PHENERGAN) 25 MG tablet Take 25 mg by mouth every 6 (six) hours as needed for nausea or vomiting.     triamcinolone (NASACORT) 55 MCG/ACT AERO nasal inhaler Place 2 sprays into the nose daily. Use at night (Patient taking differently: Place 1 spray into the nose daily. Use at night) 1 each 12   No current facility-administered medications for this visit.    PHYSICAL EXAMINATION: Not performed   LABORATORY DATA:  I have reviewed the data as listed    Latest Ref Rng & Units 10/18/2023   10:38 AM 09/05/2023   11:57 AM 08/14/2023    8:37 AM  CBC  WBC  4.0 - 10.5 K/uL 5.7  6.1  5.3   Hemoglobin 12.0 - 15.0 g/dL 16.1  09.6  04.5   Hematocrit 36.0 - 46.0 % 39.2  37.5  36.7   Platelets 150 - 400 K/uL 265  385  286         Latest Ref Rng & Units 10/18/2023   10:38 AM 09/05/2023   11:57 AM 08/14/2023    8:37 AM  CMP  Glucose 70 -  99 mg/dL 914  782  956   BUN 8 - 23 mg/dL 22  22  18    Creatinine 0.44 - 1.00 mg/dL 2.13  0.86  5.78   Sodium 135 - 145 mmol/L 139  140  141   Potassium 3.5 - 5.1 mmol/L 3.6  3.5  3.4   Chloride 98 - 111 mmol/L 104  105  105   CO2 22 - 32 mmol/L 28  28  28    Calcium 8.9 - 10.3 mg/dL 9.7  9.1  9.3   Total Protein 6.5 - 8.1 g/dL 7.0  6.7  6.6   Total Bilirubin <1.2 mg/dL 0.9  0.7  1.0   Alkaline Phos 38 - 126 U/L 67  73  88   AST 15 - 41 U/L 15  15  15    ALT 0 - 44 U/L <5  <5  <5       RADIOGRAPHIC STUDIES: I have personally reviewed the radiological images as listed and agreed with the findings in the report. No results found.     I discussed the assessment and treatment plan with the patient. The patient was provided an opportunity to ask questions and all were answered. The patient agreed with the plan and demonstrated an understanding of the instructions.   The patient was advised to call back or seek an in-person evaluation if the symptoms worsen or if the condition fails to improve as anticipated.  I provided 21 minutes of non face-to-face telephone visit time during this encounter, and > 50% was spent counseling as documented under my assessment & plan.     Malachy Mood, MD 11/12/23

## 2023-11-12 NOTE — Assessment & Plan Note (Signed)
-  stage IB (T2, N0, Mx), diagnosed in April 2024 -Presented to the emergency room on 4/17 with jaundice and dark urine -Baseline bilirubin elevated at 8.6 -CT scan showed pancreatic head lesion with involvement of the distal CBD and associated biliary ductal dilation.  No evidence of lymphadenopathy or metastatic disease to the liver -Had ERCP and stent placement on 4/19 -Underwent EUS by Dr. Meridee Score on 04/08/23 showing 30 x 23 mm pancreatic head lesion without any vascular involvement.  -The final pathology showed pancreatic adenocarcinoma -She has met pancreatobiliary surgeon Dr. Donell Beers, and she declined the surgery.  Patient understands that her cancer is not curable without surgery. -I encouraged her to consider chemotherapy for 3 to 6 months, with consolidation radiation -Patient has decided to start chemotherapy gemcitabine and cisplatin every 2 weeks.  She started on 05/27/23, she has received a total of 4 doses of today. -plan to repeat CT on 8/26 -Her tumor marker CA 19.9 has dropped significantly since she started chemo, she is likely responding to treatment. -She underwent biliary stent exchange and fiducial placement by Dr. Meridee Score on August 08, 2023. She completed SBRT on 09/26/2023

## 2023-11-14 ENCOUNTER — Telehealth: Payer: Self-pay | Admitting: Hematology

## 2023-11-15 ENCOUNTER — Other Ambulatory Visit: Payer: Self-pay

## 2023-11-15 DIAGNOSIS — C250 Malignant neoplasm of head of pancreas: Secondary | ICD-10-CM

## 2023-11-21 ENCOUNTER — Other Ambulatory Visit: Payer: Self-pay

## 2023-11-26 ENCOUNTER — Ambulatory Visit
Admission: RE | Admit: 2023-11-26 | Discharge: 2023-11-26 | Disposition: A | Discharge status: Home / Self Care | Payer: Medicare PPO | Source: Ambulatory Visit | Attending: Radiation Oncology | Admitting: Radiation Oncology

## 2023-11-26 NOTE — Progress Notes (Signed)
  Radiation Oncology         [phone] ________________________________  Name: Marie Ford MRN: [id number]  Date of Service: 11/26/2023  DOB: September 17, 1946  Post Treatment Telephone Note  Diagnosis:  Stage IB, cT2N0M0, adenocarcinoma of the head of pancreas (as documented in provider EOT note)  The patient was not available for call today.   The patient has scheduled follow up with her medical oncologist Dr. [name] for ongoing surveillance, and was encouraged to call if she develops concerns or questions regarding radiation.   Ruel Favors, LPN

## 2023-12-01 ENCOUNTER — Other Ambulatory Visit: Payer: Self-pay

## 2023-12-01 ENCOUNTER — Emergency Department (HOSPITAL_COMMUNITY)
Admission: EM | Admit: 2023-12-01 | Discharge: 2023-12-02 | Disposition: A | Discharge status: Home / Self Care | Payer: Medicare PPO | Attending: Emergency Medicine | Admitting: Emergency Medicine

## 2023-12-01 ENCOUNTER — Emergency Department (HOSPITAL_COMMUNITY): Payer: Medicare PPO

## 2023-12-01 DIAGNOSIS — I129 Hypertensive chronic kidney disease with stage 1 through stage 4 chronic kidney disease, or unspecified chronic kidney disease: Secondary | ICD-10-CM | POA: Insufficient documentation

## 2023-12-01 DIAGNOSIS — R55 Syncope and collapse: Secondary | ICD-10-CM | POA: Insufficient documentation

## 2023-12-01 DIAGNOSIS — G20C Parkinsonism, unspecified: Secondary | ICD-10-CM | POA: Insufficient documentation

## 2023-12-01 DIAGNOSIS — R1011 Right upper quadrant pain: Secondary | ICD-10-CM | POA: Insufficient documentation

## 2023-12-01 DIAGNOSIS — R10819 Abdominal tenderness, unspecified site: Secondary | ICD-10-CM | POA: Insufficient documentation

## 2023-12-01 DIAGNOSIS — N189 Chronic kidney disease, unspecified: Secondary | ICD-10-CM | POA: Insufficient documentation

## 2023-12-01 DIAGNOSIS — Z8507 Personal history of malignant neoplasm of pancreas: Secondary | ICD-10-CM | POA: Diagnosis not present

## 2023-12-01 LAB — CBC WITH DIFFERENTIAL/PLATELET
Abs Immature Granulocytes: 0.05 10*3/uL (ref 0.00–0.07)
Basophils Absolute: 0 10*3/uL (ref 0.0–0.1)
Basophils Relative: 0 %
Eosinophils Absolute: 0 10*3/uL (ref 0.0–0.5)
Eosinophils Relative: 0 %
HCT: 43.6 % (ref 36.0–46.0)
Hemoglobin: 15.2 g/dL — ABNORMAL HIGH (ref 12.0–15.0)
Immature Granulocytes: 1 %
Lymphocytes Relative: 8 %
Lymphs Abs: 0.7 10*3/uL (ref 0.7–4.0)
MCH: 32.8 pg (ref 26.0–34.0)
MCHC: 34.9 g/dL (ref 30.0–36.0)
MCV: 94 fL (ref 80.0–100.0)
Monocytes Absolute: 0.8 10*3/uL (ref 0.1–1.0)
Monocytes Relative: 9 %
Neutro Abs: 7.4 10*3/uL (ref 1.7–7.7)
Neutrophils Relative %: 82 %
Platelets: 310 10*3/uL (ref 150–400)
RBC: 4.64 MIL/uL (ref 3.87–5.11)
RDW: 12.6 % (ref 11.5–15.5)
WBC: 9 10*3/uL (ref 4.0–10.5)
nRBC: 0 % (ref 0.0–0.2)

## 2023-12-01 LAB — COMPREHENSIVE METABOLIC PANEL
ALT: 14 U/L (ref 0–44)
AST: 74 U/L — ABNORMAL HIGH (ref 15–41)
Albumin: 3.9 g/dL (ref 3.5–5.0)
Alkaline Phosphatase: 143 U/L — ABNORMAL HIGH (ref 38–126)
Anion gap: 14 (ref 5–15)
BUN: 19 mg/dL (ref 8–23)
CO2: 21 mmol/L — ABNORMAL LOW (ref 22–32)
Calcium: 9.3 mg/dL (ref 8.9–10.3)
Chloride: 99 mmol/L (ref 98–111)
Creatinine, Ser: 0.87 mg/dL (ref 0.44–1.00)
GFR, Estimated: >60 mL/min (ref >60)
Glucose, Bld: 148 mg/dL — ABNORMAL HIGH (ref 70–99)
Potassium: 3.2 mmol/L — ABNORMAL LOW (ref 3.5–5.1)
Sodium: 134 mmol/L — ABNORMAL LOW (ref 135–145)
Total Bilirubin: 1.9 mg/dL — ABNORMAL HIGH (ref <1.2)
Total Protein: 7.4 g/dL (ref 6.5–8.1)

## 2023-12-01 LAB — TROPONIN I (HIGH SENSITIVITY): Troponin I (High Sensitivity): 9 ng/L (ref <18)

## 2023-12-01 LAB — LIPASE, BLOOD: Lipase: 19 U/L (ref 11–51)

## 2023-12-01 MED ORDER — IOHEXOL 300 MG/ML  SOLN
100.0000 mL | Freq: Once | INTRAMUSCULAR | Status: AC | PRN
  Administered 2023-12-01: 100 mL via INTRAVENOUS

## 2023-12-01 NOTE — ED Provider Notes (Signed)
[place] EMERGENCY DEPARTMENT AT [facility] Provider Note   CSN: [id number] Arrival date & time: 12/01/23  2154     History {Add pertinent medical, surgical, social history, OB history to HPI:1} Chief Complaint  Patient presents with   Loss of Consciousness    Patient brought in by medics from [place] [facility] home. Patient caregiver reported patient having a syncopy episode in the parking garage. Patient did not fall or hit her head. Patient does not remember falling. Hx cancer and parkinson's per medics. BP 176/102 BS 192 100% room air with medics. No other complaints at this time     Marie Ford is a 77 y.o. female with history of Parkinson disease and stage Ib pancreatic cancer status postchemotherapy and radiation with significant improvement on restaging CT who presents today with concern for syncopal episode.  Patient is accompanied by her caregiver at the bedside.  Reportedly patient was feeling nauseated today with right upper abdominal pain, multiple doses of vomiting which she states was green in color though she had eaten some green Jell-O prior to vomiting.  No chest pain shortness of breath palpitations dizziness lightheadedness.  According to her caregiver the called her reporting abdominal pain.  She may be having a heart attack according to the patient, requesting go to the ER.  When the caregiver arrived to the patient's apartment she helps her get ongoing elevator, however after arriving in the main floor the patient began to stare off in the distance, was not responding to the caregiver, took significant coaxing to walk off of the elevator collapsing into the arms and her caregiver.  This prompted EMS call.  In addition he also history is history of hypertension, GERD, paroxysmal SVT, CKD no anticoagulation.  HPI     Home Medications Prior to Admission medications   Medication Sig Start Date End Date Taking? Authorizing Provider  ALPRAZolam Prudy Feeler) 0.5 MG  tablet Take 0.25 mg by mouth as needed for anxiety. Takes as needed for sleep    [provider]  AUVELITY 45-105 MG TBCR Take 1 tablet by mouth every evening.    [provider]  buPROPion (WELLBUTRIN XL) 150 MG 24 hr tablet Take 150 mg by mouth daily.    [provider]  Calcium Carbonate Antacid (CALCIUM CARBONATE PO) Take by mouth.    [provider]  carbidopa-levodopa (SINEMET CR) 50-200 MG tablet Take 1 tablet by mouth at bedtime. 10/02/23   Anson Fret, MD  carbidopa-levodopa (SINEMET IR) 25-100 MG tablet Take 1.5 tablets by mouth 4 (four) times daily. -Try to separate Sinemet from food (especially protein-rich foods like meat, dairy, eggs) by about 30-60 mins - this will help the absorption of the medication. If you have some nausea with the medication, you can take it with some light food like crackers or ginger ale. Try taking at 7am, 10am, 1pm and 4pm and then at bedtime take the extended release(Sinemet CR) 10/02/23   Anson Fret, MD  donepezil (ARICEPT) 10 MG tablet Take 1 tablet (10 mg total) by mouth at bedtime. 07/08/23   Anson Fret, MD  dronabinol (MARINOL) 2.5 MG capsule TAKE 1 CAPSULE BY MOUTH TWICE DAILY DAILY BEFORE A MEAL *REFRIGERATE* *DO NOT FILL UNTIL 8/27* 11/11/23   Carlean Jews, NP  lidocaine-prilocaine (EMLA) cream Apply to affected area once 05/09/23   Malachy Mood, MD  losartan (COZAAR) 50 MG tablet Take 1 tablet (50 mg total) by mouth daily. 03/30/23 09/26/23  Candelaria Stagers  T, MD  magic mouthwash (multi-ingredient) oral suspension Take 5 mLs by mouth 4 (four) times daily. 06/28/23   Malachy Mood, MD  Multiple Vitamin (MULTIVITAMIN WITH MINERALS) TABS tablet Take 1 tablet by mouth daily. Women's Multivitamin    [provider]  OLANZapine (ZYPREXA) 10 MG tablet Take 1 tablet (10 mg total) by mouth at bedtime. 10/22/23   Malachy Mood, MD  OVER THE COUNTER MEDICATION Take 1 tablet by mouth daily as needed (constipation).     [provider]  pantoprazole (PROTONIX) 40 MG tablet Take 40 mg by mouth daily. 03/21/23   [provider]  Polyethyl Glycol-Propyl Glycol (SYSTANE) 0.4-0.3 % SOLN Place 1 drop into both eyes 3 (three) times daily as needed (dry/irritated eyes.).    [provider]  polyethylene glycol (MIRALAX / GLYCOLAX) 17 g packet Take 17 g by mouth daily.    [provider]  pravastatin (PRAVACHOL) 80 MG tablet Take 1 tablet (80 mg total) by mouth every evening. 04/13/23   Almon Hercules, MD  prochlorperazine (COMPAZINE) 10 MG tablet Take 1 tablet (10 mg total) by mouth every 6 (six) hours as needed for nausea or vomiting. 05/09/23   Malachy Mood, MD  promethazine (PHENERGAN) 25 MG tablet Take 25 mg by mouth every 6 (six) hours as needed for nausea or vomiting.    [provider]  triamcinolone (NASACORT) 55 MCG/ACT AERO nasal inhaler Place 2 sprays into the nose daily. Use at night Patient taking differently: Place 1 spray into the nose daily. Use at night 11/01/20   Drema Halon, MD      Allergies    Erythromycin, Macrobid [nitrofurantoin], Keflex [cephalexin], Levofloxacin, Penicillins, and Sulfamethoxazole-trimethoprim    Review of Systems   Review of Systems  Gastrointestinal:  Positive for abdominal pain, nausea and vomiting. Negative for diarrhea.  Genitourinary: Negative.   Neurological:  Positive for syncope.    Physical Exam Updated Vital Signs There were no vitals taken for this visit. Physical Exam Vitals and nursing note reviewed.  Constitutional:      Appearance: She is not ill-appearing or toxic-appearing.  HENT:     Head: Normocephalic and atraumatic.     Mouth/Throat:     Mouth: Mucous membranes are moist.     Pharynx: No oropharyngeal exudate or posterior oropharyngeal erythema.  Eyes:     General:        Right eye: No discharge.        Left eye: No discharge.     Extraocular Movements: Extraocular movements intact.      Conjunctiva/sclera: Conjunctivae normal.     Pupils: Pupils are equal, round, and reactive to light.  Cardiovascular:     Rate and Rhythm: Normal rate and regular rhythm.     Pulses: Normal pulses.     Heart sounds: Normal heart sounds. No murmur heard. Pulmonary:     Effort: Pulmonary effort is normal. No respiratory distress.     Breath sounds: Normal breath sounds. No wheezing or rales.  Abdominal:     General: Bowel sounds are normal. There is no distension.     Palpations: Abdomen is soft.     Tenderness: There is abdominal tenderness in the right upper quadrant. There is no right CVA tenderness, left CVA tenderness, guarding or rebound.  Musculoskeletal:        General: No deformity.     Cervical back: Neck supple.  Skin:    General: Skin is warm and dry.  Neurological:  General: No focal deficit present.     Mental Status: She is alert and oriented to person, place, and time. Mental status is at baseline.     Comments: At baseline has some delayed coordination of the left upper extremity, denies any change at this time.  Gait deferred for patient's safety. Does have difficulty with word finding.  Per patient and caregiver this is her baseline with her Parkinson's.  Psychiatric:        Mood and Affect: Mood normal.     ED Results / Procedures / Treatments   Labs (all labs ordered are listed, but only abnormal results are displayed) Labs Reviewed  CBC WITH DIFFERENTIAL/PLATELET  COMPREHENSIVE METABOLIC PANEL  URINALYSIS, W/ REFLEX TO CULTURE (INFECTION SUSPECTED)  TROPONIN I (HIGH SENSITIVITY)    EKG None  Radiology No results found.  Procedures Procedures  {Document cardiac monitor, telemetry assessment procedure when appropriate:1}  Medications Ordered in ED Medications - No data to display  ED Course/ Medical Decision Making/ A&P   {   Click here for ABCD2, HEART and other calculatorsREFRESH Note before signing :1}                               Medical Decision Making 77 year old female presents with syncope.  Mildly hypertensive on intake, vitals otherwise normal.  Cardiopulmonary exam is unremarkable, abdominal exam is significant only for right upper quadrant tenderness palpation without CVAT, guarding or rebound.  No focal deficit on neurologic exam, patient at baseline.  Does have difficulty with word finding.  Per patient and caregiver this is her baseline with her Parkinson's.  The differential for syncope is extensive and includes, but is not limited to: arrythmia (Vtach, SVT, SSS, sinus arrest, AV block, bradycardia), aortic stenosis, AMI, hypertrophic obstructive cardiomyopathy (HOCM), PE, atrial myxoma, pulmonary hypertension, orthostatic hypotension, hypovolemia, drug effect, GB syndrome, micturition, cough, carotid sinus sensitivity, seizure, TIA/CVA, hypoglycemia, vertigo.   Amount and/or Complexity of Data Reviewed Labs: ordered. Radiology: ordered.   ***  {Document critical care time when appropriate:1} {Document review of labs and clinical decision tools ie heart score, Chads2Vasc2 etc:1}  {Document your independent review of radiology images, and any outside records:1} {Document your discussion with family members, caretakers, and with consultants:1} {Document social determinants of health affecting pt's care:1} {Document your decision making why or why not admission, treatments were needed:1} Final Clinical Impression(s) / ED Diagnoses Final diagnoses:  None    Rx / DC Orders ED Discharge Orders     None

## 2023-12-02 ENCOUNTER — Telehealth: Payer: Self-pay

## 2023-12-02 LAB — URINALYSIS, W/ REFLEX TO CULTURE (INFECTION SUSPECTED)
Bacteria, UA: NONE SEEN
Bilirubin Urine: NEGATIVE
Glucose, UA: NEGATIVE mg/dL
Hgb urine dipstick: NEGATIVE
Ketones, ur: 5 mg/dL — AB
Leukocytes,Ua: NEGATIVE
Nitrite: NEGATIVE
Protein, ur: NEGATIVE mg/dL
Specific Gravity, Urine: 1.026 (ref 1.005–1.030)
pH: 7 (ref 5.0–8.0)

## 2023-12-02 LAB — TROPONIN I (HIGH SENSITIVITY): Troponin I (High Sensitivity): 11 ng/L (ref <18)

## 2023-12-02 NOTE — Telephone Encounter (Signed)
Would be a good idea to have her follow up with lab appointment prior. She had some nonspecific RUQ abdominal pain with history of pancreatic cancer. Thanks  -HB

## 2023-12-02 NOTE — Telephone Encounter (Signed)
Pt's sister called stating the pt was seen yesterday in the ER and she was just notified Dr. Latanya Maudlin office of the pt's recent ER visit.  Pt's sister stated she does not need a return call but just wanted to make Dr. Latanya Maudlin office aware that the pt was seen in the ER.  Notified Dr. Latanya Maudlin Team since Dr. [name] is out of the office.

## 2023-12-02 NOTE — Discharge Instructions (Signed)
Call your oncology office today to discuss your ER visit. You were seen for your belly pain and altered mental status. Your workup here showed some changes in your liver function testing that warrants further investigation in the outpatient setting.  Return to the ER if you develop any fevers, worsening abdominal pain, worsened nausea or vomiting, confusion, or any other new severe symptoms.

## 2023-12-05 ENCOUNTER — Telehealth: Payer: Self-pay | Admitting: Nurse Practitioner

## 2023-12-06 ENCOUNTER — Other Ambulatory Visit: Payer: Self-pay

## 2023-12-06 DIAGNOSIS — C250 Malignant neoplasm of head of pancreas: Secondary | ICD-10-CM

## 2023-12-08 NOTE — Progress Notes (Unsigned)
Patient Care Team: [name], MD as PCP - General (Family Medicine) Malachy Mood, MD as Consulting Physician (Oncology)   CHIEF COMPLAINT: ED follow up for n/v and syncope  Oncology History Overview Note   Cancer Staging  Pancreatic cancer [facility] Staging form: Exocrine Pancreas, AJCC 8th Edition - Clinical: Stage IB (cT2, cN0, cM0) - Signed by Heilingoetter, Cassandra L, PA-C on 04/12/2023 Total positive nodes: 0     Pancreatic cancer (HCC)  03/27/2023 Imaging   CT ABDOMEN PELVIS W CONTRAST   IMPRESSION: Low-attenuation mass centered along the pancreatic head with involvement of the distal common duct with the associated biliary duct ductal dilatation and pancreatic atrophy. Changes are worrisome for neoplasm either a pancreatic neoplasm or cholangiocarcinoma. Recommend further evaluation.   No liver metastases. No pathologically enlarged upper abdominal nodes.   Diffuse colonic stool.  Normal appendix.  Few diverticula.   Portions of the pelvis are obscured by the streak artifact from the bilateral hip arthroplasties.   03/29/2023 Imaging   DG ERCP   IMPRESSION: Intraoperative fluoroscopic images show dilated intra or extrahepatic bile ducts with high-grade stenosis at the level of the distal CBD. Biliary tree is decompressed following placement of common bile duct stent.   04/08/2023 Pathology Results   CYTOLOGY - NON PAP  CASE: [id number]  PATIENT: Marie Ford  Non-Gynecological Cytology Report   FINAL MICROSCOPIC DIAGNOSIS:  A. PANCREAS, HEAD, FINE NEEDLE ASPIRATION:  - Malignant cells present (see Comment)      04/08/2023 Procedure   EUS by Dr. Meridee Score   EUS impression  - A mass was identified in the pancreatic head/ genu of the pancreas. Cytology results are pending. However, the endosonographic appearance is highly suspicious for adenocarcinoma. This was staged T2 N0 Mx by endosonographic criteria. The staging applies if malignancy is  confirmed. Fine needle biopsy performed.  - There was dilation in the common bile duct and in the common hepatic duct which measured up to 8 mm.  - One stent was visualized endosonographically within the common bile duct.  - Hyperechoic material consistent with sludge was visualized endosonographically in the gallbladder.  - Two enlarged lymph nodes were visualized in the celiac region ( level 20) and porta hepatis region. Tissue has not been obtained. However, the endosonographic appearance is suggestive of benign inflammatory change based on the visualization endosonographically.   04/12/2023 Initial Diagnosis   Pancreatic cancer (HCC)   04/12/2023 Cancer Staging   Staging form: Exocrine Pancreas, AJCC 8th Edition - Clinical: Stage IB (cT2, cN0, cM0) - Signed by Heilingoetter, Cassandra L, PA-C on 04/12/2023 Total positive nodes: 0   04/12/2023 Tumor Marker   Patient's tumor was tested for the following markers: CA 19.9. Results of the tumor marker test revealed 512.   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  Report date is 05/03/2023.   The CancerNext-Expanded gene panel offered by [facility] and includes sequencing, rearrangement, and RNA analysis for the following 71 genes:  AIP, ALK, APC, ATM, BAP1, BARD1, BMPR1A, BRCA1, BRCA2, BRIP1, CDC73, CDH1, CDK4, CDKN1B, CDKN2A, CHEK2, DICER1, FH, FLCN, KIF1B, LZTR1,MAX, MEN1, MET, MLH1, MSH2, MSH6, MUTYH, NF1, NF2, NTHL1, PALB2, PHOX2B, PMS2, POT1, PRKAR1A, PTCH1, PTEN, RAD51C,RAD51D, RB1, RET, SDHA, SDHAF2, SDHB, SDHC, SDHD, SMAD4, SMARCA4, SMARCB1, SMARCE1, STK11, SUFU, TMEM127, TP53,TSC1, TSC2 and VHL (sequencing and deletion/duplication); AXIN2, CTNNA1, EGFR, EGLN1, HOXB13, KIT, MITF, MSH3, PDGFRA, POLD1 and POLE (sequencing only); EPCAM and GREM1 (deletion/duplication only).     05/27/2023 -  Chemotherapy   Patient is on Treatment Plan : PANCREATIC Abraxane D1,8,15 + Gemcitabine D1,8,15 q28d         CURRENT THERAPY:  Pancreatic Abraxane + Gemcitabine days 1, 15 q28 days 05/27/23 - 08/14/23 followed by consolidation radiation SBRT completed 09/26/23; currently on observation   INTERVAL HISTORY Marie Ford returns for follow-up.  Last seen by Dr. [name] 11/2023. She developed n/v and abdominal pain at her residence and called family to go to ED but had a syncopal episode on the way (12/01/23). Labs showed Hgb 15.2, elevated AST 74, alk phos 143, and Tbili of 1.9 from normal. CT's showed decreasing size of pancreatic mass and no cranial abnormality. She is here for ED follow up. N/V and pain resolved now only having "twinges" of discomfort in RUQ. Managed well with tylenol. Bowels moving. Has dysuria and urinary hesitancy, would like urine checked. Noticed cognitive function has declined. She did a lot over the holiday and didn't sleep well last night, notes chronic fatigue. Also having some vision changes in the setting of diplopia. Sees Dr. [name] for parkinson's.   ROS  All other systems reviewed and negative  Past Medical History:  Diagnosis Date   Chronic kidney disease    stage 3    Chronic sphenoidal sinusitis    Degenerative arthritis of hip    requirde surgery 2009   Depression 2010   Diplopia 2011   in left lateral gaze    Diplopia    " my eyes dont track together, i have corrective prisms in my eye glasses"    Diverticulosis 2006   noted in a colonoscopy   Esophageal reflux 2013   Hypertension    IGT (impaired glucose tolerance) 2011   denies hx of diabetes    Insomnia 2009   Medical history non-contributory    Mixed hyperlipidemia 2000   Osteoarthritis    Osteopenia 2005   Other and unspecified hyperlipidemia 2000   Other specified disease of sebaceous glands    Palpitations 2009   secondary to PSVT   Parkinson disease (HCC)    PNA (pneumonia) 2014   Psoriasis 2004   PSVT (paroxysmal supraventricular tachycardia) (HCC) 2009   "report it has been some time since i had  that "    Situational stress    Urinary tract infection    thru wed 05/15/23, completed 3d antibiotics   Venous insufficiency since 2017     Past Surgical History:  Procedure Laterality Date   BILIARY BRUSHING  03/29/2023   Procedure: BILIARY BRUSHING;  Surgeon: Hilarie Fredrickson, MD;  Location: [facility] ENDOSCOPY;  Service: Gastroenterology;;   BILIARY DILATION  08/08/2023   Procedure: BILIARY DILATION;  Surgeon: Lemar Lofty., MD;  Location: [facility] ENDOSCOPY;  Service: Gastroenterology;;   BILIARY STENT PLACEMENT  03/29/2023   Procedure: BILIARY STENT PLACEMENT;  Surgeon: Hilarie Fredrickson, MD;  Location: [facility] ENDOSCOPY;  Service: Gastroenterology;;   BILIARY STENT PLACEMENT N/A 08/08/2023   Procedure: BILIARY STENT PLACEMENT;  Surgeon: Lemar Lofty., MD;  Location: [facility] ENDOSCOPY;  Service: Gastroenterology;  Laterality: N/A;   BIOPSY  04/08/2023   Procedure: BIOPSY;  Surgeon: Meridee Score Netty Starring., MD;  Location: [facility] ENDOSCOPY;  Service: Gastroenterology;;   CATARACT EXTRACTION W/ INTRAOCULAR LENS  IMPLANT, BILATERAL     COLONOSCOPY  01/30/2010   Jarold Motto   ENDOSCOPIC RETROGRADE CHOLANGIOPANCREATOGRAPHY (ERCP) WITH PROPOFOL N/A 08/08/2023   Procedure: ENDOSCOPIC RETROGRADE CHOLANGIOPANCREATOGRAPHY (ERCP) WITH PROPOFOL;  Surgeon: Lemar Lofty., MD;  Location: [facility] ENDOSCOPY;  Service:  Gastroenterology;  Laterality: N/A;   ERCP N/A 03/29/2023   Procedure: ENDOSCOPIC RETROGRADE CHOLANGIOPANCREATOGRAPHY (ERCP);  Surgeon: Hilarie Fredrickson, MD;  Location: [facility] ENDOSCOPY;  Service: Gastroenterology;  Laterality: N/A;   ESOPHAGOGASTRODUODENOSCOPY N/A 08/08/2023   Procedure: ESOPHAGOGASTRODUODENOSCOPY (EGD);  Surgeon: Lemar Lofty., MD;  Location: [facility] ENDOSCOPY;  Service: Gastroenterology;  Laterality: N/A;   ESOPHAGOGASTRODUODENOSCOPY (EGD) WITH PROPOFOL N/A 04/08/2023   Procedure: ESOPHAGOGASTRODUODENOSCOPY (EGD) WITH PROPOFOL;  Surgeon: Meridee Score Netty Starring., MD;  Location: [facility]  ENDOSCOPY;  Service: Gastroenterology;  Laterality: N/A;   ETHMOIDECTOMY Left 11/17/2020   Procedure: LEFT SIDED TOTAL ETHMOIDECTOMY;  Surgeon: Drema Halon, MD;  Location: [facility];  Service: ENT;  Laterality: Left;   EUS N/A 04/08/2023   Procedure: UPPER ENDOSCOPIC ULTRASOUND (EUS) LINEAR;  Surgeon: Lemar Lofty., MD;  Location: [facility] ENDOSCOPY;  Service: Gastroenterology;  Laterality: N/A;   EUS N/A 08/08/2023   Procedure: UPPER ENDOSCOPIC ULTRASOUND (EUS) LINEAR;  Surgeon: Lemar Lofty., MD;  Location: [facility] ENDOSCOPY;  Service: Gastroenterology;  Laterality: N/A;   EYE SURGERY     FIDUCIAL MARKER PLACEMENT  08/08/2023   Procedure: FIDUCIAL MARKER PLACEMENT;  Surgeon: Lemar Lofty., MD;  Location: [facility] ENDOSCOPY;  Service: Gastroenterology;;   FINE NEEDLE ASPIRATION N/A 04/08/2023   Procedure: FINE NEEDLE ASPIRATION (FNA) LINEAR;  Surgeon: Lemar Lofty., MD;  Location: [facility] ENDOSCOPY;  Service: Gastroenterology;  Laterality: N/A;   HEMORRHOID SURGERY     IR IMAGING GUIDED PORT INSERTION  05/17/2023   JOINT REPLACEMENT     LASIK     left medial and superior rectus muscle recesion for diplopia correction     REMOVAL OF STONES  08/08/2023   Procedure: REMOVAL OF SLUDGE;  Surgeon: Meridee Score Netty Starring., MD;  Location: [facility] ENDOSCOPY;  Service: Gastroenterology;;   SINUS ENDO WITH FUSION Left 11/17/2020   Procedure: SINUS ENDOSCOPY WITH FUSION NAVIGATION;  Surgeon: Drema Halon, MD;  Location: [facility];  Service: ENT;  Laterality: Left;   SPHENOIDECTOMY Left 11/17/2020   Procedure: LEFT SIDED SPHENOIDECTOMY WITH TISSUE REMOVAL;  Surgeon: Drema Halon, MD;  Location: [facility];  Service: ENT;  Laterality: Left;   SPHINCTEROTOMY  03/29/2023   Procedure: SPHINCTEROTOMY;  Surgeon: Hilarie Fredrickson, MD;  Location: [facility] ENDOSCOPY;  Service: Gastroenterology;;   Francine Graven REMOVAL  08/08/2023   Procedure: STENT  REMOVAL;  Surgeon: Lemar Lofty., MD;  Location: [facility] ENDOSCOPY;  Service: Gastroenterology;;   surgery of right eye rectus muscles for diplpia correction   2019   TOTAL HIP ARTHROPLASTY Right 2010   TOTAL HIP ARTHROPLASTY Left 08/19/2019   Procedure: TOTAL HIP ARTHROPLASTY ANTERIOR APPROACH;  Surgeon: Ollen Gross, MD;  Location: [facility] ORS;  Service: Orthopedics;  Laterality: Left;    TURBINATE REDUCTION Left 11/17/2020   Procedure: LEFT SIDED TURBINATE REDUCTION;  Surgeon: Drema Halon, MD;  Location: [facility];  Service: ENT;  Laterality: Left;   URETHRAL SLING     midurethral sling with TVT Exact and Cystoscopy     Outpatient Encounter Medications as of 12/09/2023  Medication Sig Note   ALPRAZolam (XANAX) 0.5 MG tablet Take 0.25 mg by mouth as needed for anxiety. Takes as needed for sleep    AUVELITY 45-105 MG TBCR Take 1 tablet by mouth every evening. 03/28/2023: Per pt she only takes this once in the evening   buPROPion (WELLBUTRIN XL) 150 MG 24 hr tablet Take 150 mg by mouth daily.    Calcium Carbonate  Antacid (CALCIUM CARBONATE PO) Take by mouth.    carbidopa-levodopa (SINEMET CR) 50-200 MG tablet Take 1 tablet by mouth at bedtime.    carbidopa-levodopa (SINEMET IR) 25-100 MG tablet Take 1.5 tablets by mouth 4 (four) times daily. -Try to separate Sinemet from food (especially protein-rich foods like meat, dairy, eggs) by about 30-60 mins - this will help the absorption of the medication. If you have some nausea with the medication, you can take it with some light food like crackers or ginger ale. Try taking at 7am, 10am, 1pm and 4pm and then at bedtime take the extended release(Sinemet CR)    donepezil (ARICEPT) 10 MG tablet Take 1 tablet (10 mg total) by mouth at bedtime.    dronabinol (MARINOL) 2.5 MG capsule TAKE 1 CAPSULE BY MOUTH TWICE DAILY DAILY BEFORE A MEAL *REFRIGERATE* *DO NOT FILL UNTIL 8/27*    lidocaine-prilocaine (EMLA) cream Apply to  affected area once 05/17/2023: Not started   magic mouthwash (multi-ingredient) oral suspension Take 5 mLs by mouth 4 (four) times daily.    Multiple Vitamin (MULTIVITAMIN WITH MINERALS) TABS tablet Take 1 tablet by mouth daily. Women's Multivitamin    OLANZapine (ZYPREXA) 10 MG tablet Take 1 tablet (10 mg total) by mouth at bedtime.    OVER THE COUNTER MEDICATION Take 1 tablet by mouth daily as needed (constipation).    pantoprazole (PROTONIX) 40 MG tablet Take 40 mg by mouth daily.    Polyethyl Glycol-Propyl Glycol (SYSTANE) 0.4-0.3 % SOLN Place 1 drop into both eyes 3 (three) times daily as needed (dry/irritated eyes.).    polyethylene glycol (MIRALAX / GLYCOLAX) 17 g packet Take 17 g by mouth daily.    pravastatin (PRAVACHOL) 80 MG tablet Take 1 tablet (80 mg total) by mouth every evening.    prochlorperazine (COMPAZINE) 10 MG tablet Take 1 tablet (10 mg total) by mouth every 6 (six) hours as needed for nausea or vomiting.    promethazine (PHENERGAN) 25 MG tablet Take 25 mg by mouth every 6 (six) hours as needed for nausea or vomiting.    triamcinolone (NASACORT) 55 MCG/ACT AERO nasal inhaler Place 2 sprays into the nose daily. Use at night (Patient taking differently: Place 1 spray into the nose daily. Use at night)    losartan (COZAAR) 50 MG tablet Take 1 tablet (50 mg total) by mouth daily.    No facility-administered encounter medications on file as of 12/09/2023.     Today's Vitals   12/09/23 1317 12/09/23 1318 12/09/23 1325  BP: (!) 151/81 (!) 153/75   Pulse: 77    Resp: 17    Temp: 98 F (36.7 C)    TempSrc: Temporal    SpO2: 98%    Weight: 136 lb 12.8 oz (62.1 kg)    PainSc:   0-No pain   Body mass index is 22.08 kg/m.   PHYSICAL EXAM GENERAL:alert, no distress and comfortable SKIN: no rash  EYES: sclera clear LUNGS: clear with normal breathing effort HEART: regular rate & rhythm, no lower extremity edema ABDOMEN: abdomen soft, non-tender and normal bowel  sounds NEURO: alert & oriented x 3 with fluent speech, no focal deficits PAC without erythema    CBC    Component Value Date/Time   WBC 7.5 12/09/2023 1252   WBC 9.0 12/01/2023 2249   RBC 4.01 12/09/2023 1252   HGB 13.0 12/09/2023 1252   HGB 12.7 05/30/2023 1316   HCT 37.2 12/09/2023 1252   HCT 36.8 05/30/2023 1316   PLT 289 12/09/2023  1252   PLT 228 05/30/2023 1316   MCV 92.8 12/09/2023 1252   MCV 95 05/30/2023 1316   MCH 32.4 12/09/2023 1252   MCHC 34.9 12/09/2023 1252   RDW 12.5 12/09/2023 1252   RDW 11.7 05/30/2023 1316   LYMPHSABS 0.8 12/09/2023 1252   LYMPHSABS 0.9 05/30/2023 1316   MONOABS 1.1 (H) 12/09/2023 1252   EOSABS 0.1 12/09/2023 1252   EOSABS 0.1 05/30/2023 1316   BASOSABS 0.0 12/09/2023 1252   BASOSABS 0.0 05/30/2023 1316     CMP     Component Value Date/Time   NA 138 12/09/2023 1252   NA 140 05/30/2023 1316   K 3.2 (L) 12/09/2023 1252   CL 104 12/09/2023 1252   CO2 26 12/09/2023 1252   GLUCOSE 131 (H) 12/09/2023 1252   BUN 18 12/09/2023 1252   BUN 29 (H) 05/30/2023 1316   CREATININE 0.95 12/09/2023 1252   CALCIUM 9.0 12/09/2023 1252   PROT 6.9 12/09/2023 1252   PROT 6.4 05/30/2023 1316   ALBUMIN 3.7 12/09/2023 1252   ALBUMIN 3.9 05/30/2023 1316   AST 34 12/09/2023 1252   ALT 10 12/09/2023 1252   ALKPHOS 163 (H) 12/09/2023 1252   BILITOT 0.9 12/09/2023 1252   GFRNONAA >60 12/09/2023 1252   GFRAA 58 (L) 08/20/2019 0252     ASSESSMENT & PLAN:Marie Ford is a 77 y.o. female with    Pancreatic cancer, stage IB (T2, N0, Mx) -Diagnosed 03/2023 when she presented to ED 4/17 with jaundice; Tbili 8.6 -CT scan showed pancreatic head lesion with involvement of the distal CBD and associated biliary ductal dilation.  No evidence of lymphadenopathy or metastatic disease to the liver -S/p ERCP and stenting on 4/19 -S/p EUS by Dr. Meridee Score on 04/08/23 showing 30 x 23 mm pancreatic head lesion without any vascular involvement. Final pathology showed  pancreatic adenocarcinoma -She has met pancreatobiliary surgeon Dr. Donell Beers, and she declined the surgery.  Patient understands that her cancer is not curable without surgery. -She started first line chemo with gem/abraxane q2 weeks on 05/27/23, CA 19-9 has dropped significantly on chemo -Restaging CT 8/26 showed partial response to the primary site, no disease progression, and incidental portal vein thrombus. Dr. [name] called pt to review and discussed the role of anticoagulation and opted to hold for now -S/p 4 doses of chemo 05/2023 - 08/2023 then proceeded with consolidation radiation completed 09/26/23  -She had acute N/V and RUQ pain episode and ED visit, with syncope on the way. Tbili 1.9 from normal. Work up was unremarkable, CT showed decreasing size of pancreatic mass, no other acute abnormality.  -The differential is wide but appears to have resolved to baseline. No evidence of cancer progression. Managing RUQ discomfort with tylenol PRN. Tbili normalized.  -Continue surveillance, will r/s 12/17/23 to 4 weeks from now   Parkinson disease -On carbidopa-levodopa, follow-up with neurology -She has some cognitive decline, will r/o UTI -She'd like referral to new ophthalmologist which we will do today. -Continue neuro f/up    PLAN: -ED course and today's labs reviewed -UA/culture today   Orders Placed This Encounter  Procedures   Urine Culture    Standing Status:   Future    Expected Date:   12/09/2023    Expiration Date:   12/08/2024   Urinalysis, Complete w Microscopic    Standing Status:   Future    Expected Date:   12/09/2023    Expiration Date:   12/08/2024      All questions were  answered. The patient knows to call the clinic with any problems, questions or concerns. No barriers to learning were detected. I spent *** counseling the patient face to face. The total time spent in the appointment was *** and more than 50% was on counseling, review of test results, and coordination  of care.   Santiago Glad, NP-C @DATE @

## 2023-12-09 ENCOUNTER — Inpatient Hospital Stay: Payer: Medicare PPO | Admitting: Nurse Practitioner

## 2023-12-09 ENCOUNTER — Other Ambulatory Visit: Payer: Self-pay

## 2023-12-09 ENCOUNTER — Inpatient Hospital Stay: Payer: Medicare PPO

## 2023-12-09 VITALS — BP 153/75 | HR 77 | Temp 98.0°F | Resp 17 | Wt 136.8 lb

## 2023-12-09 DIAGNOSIS — G20A1 Parkinson's disease without dyskinesia, without mention of fluctuations: Secondary | ICD-10-CM

## 2023-12-09 DIAGNOSIS — C250 Malignant neoplasm of head of pancreas: Secondary | ICD-10-CM

## 2023-12-09 DIAGNOSIS — R112 Nausea with vomiting, unspecified: Secondary | ICD-10-CM | POA: Diagnosis not present

## 2023-12-09 DIAGNOSIS — R300 Dysuria: Secondary | ICD-10-CM | POA: Diagnosis not present

## 2023-12-09 DIAGNOSIS — Z95828 Presence of other vascular implants and grafts: Secondary | ICD-10-CM

## 2023-12-09 DIAGNOSIS — R1011 Right upper quadrant pain: Secondary | ICD-10-CM | POA: Diagnosis not present

## 2023-12-09 DIAGNOSIS — Z9221 Personal history of antineoplastic chemotherapy: Secondary | ICD-10-CM | POA: Diagnosis not present

## 2023-12-09 DIAGNOSIS — Z923 Personal history of irradiation: Secondary | ICD-10-CM | POA: Diagnosis not present

## 2023-12-09 DIAGNOSIS — E860 Dehydration: Secondary | ICD-10-CM

## 2023-12-09 LAB — CBC WITH DIFFERENTIAL (CANCER CENTER ONLY)
Abs Immature Granulocytes: 0.02 10*3/uL (ref 0.00–0.07)
Basophils Absolute: 0 10*3/uL (ref 0.0–0.1)
Basophils Relative: 0 %
Eosinophils Absolute: 0.1 10*3/uL (ref 0.0–0.5)
Eosinophils Relative: 1 %
HCT: 37.2 % (ref 36.0–46.0)
Hemoglobin: 13 g/dL (ref 12.0–15.0)
Immature Granulocytes: 0 %
Lymphocytes Relative: 11 %
Lymphs Abs: 0.8 10*3/uL (ref 0.7–4.0)
MCH: 32.4 pg (ref 26.0–34.0)
MCHC: 34.9 g/dL (ref 30.0–36.0)
MCV: 92.8 fL (ref 80.0–100.0)
Monocytes Absolute: 1.1 10*3/uL — ABNORMAL HIGH (ref 0.1–1.0)
Monocytes Relative: 14 %
Neutro Abs: 5.5 10*3/uL (ref 1.7–7.7)
Neutrophils Relative %: 74 %
Platelet Count: 289 10*3/uL (ref 150–400)
RBC: 4.01 MIL/uL (ref 3.87–5.11)
RDW: 12.5 % (ref 11.5–15.5)
WBC Count: 7.5 10*3/uL (ref 4.0–10.5)
nRBC: 0 % (ref 0.0–0.2)

## 2023-12-09 LAB — CMP (CANCER CENTER ONLY)
ALT: 10 U/L (ref 0–44)
AST: 34 U/L (ref 15–41)
Albumin: 3.7 g/dL (ref 3.5–5.0)
Alkaline Phosphatase: 163 U/L — ABNORMAL HIGH (ref 38–126)
Anion gap: 8 (ref 5–15)
BUN: 18 mg/dL (ref 8–23)
CO2: 26 mmol/L (ref 22–32)
Calcium: 9 mg/dL (ref 8.9–10.3)
Chloride: 104 mmol/L (ref 98–111)
Creatinine: 0.95 mg/dL (ref 0.44–1.00)
GFR, Estimated: >60 mL/min (ref >60)
Glucose, Bld: 131 mg/dL — ABNORMAL HIGH (ref 70–99)
Potassium: 3.2 mmol/L — ABNORMAL LOW (ref 3.5–5.1)
Sodium: 138 mmol/L (ref 135–145)
Total Bilirubin: 0.9 mg/dL (ref 0.0–1.2)
Total Protein: 6.9 g/dL (ref 6.5–8.1)

## 2023-12-09 MED ORDER — HEPARIN SOD (PORK) LOCK FLUSH 100 UNIT/ML IV SOLN
500.0000 [IU] | Freq: Once | INTRAVENOUS | Status: AC | PRN
  Administered 2023-12-09: 500 [IU]

## 2023-12-09 MED ORDER — SODIUM CHLORIDE 0.9% FLUSH
10.0000 mL | Freq: Once | INTRAVENOUS | Status: AC | PRN
  Administered 2023-12-09: 10 mL

## 2023-12-10 ENCOUNTER — Other Ambulatory Visit: Payer: Self-pay

## 2023-12-10 ENCOUNTER — Encounter: Payer: Self-pay | Admitting: Neurology

## 2023-12-10 DIAGNOSIS — R300 Dysuria: Secondary | ICD-10-CM

## 2023-12-10 LAB — URINALYSIS, COMPLETE (UACMP) WITH MICROSCOPIC
Bacteria, UA: NONE SEEN
Bilirubin Urine: NEGATIVE
Glucose, UA: NEGATIVE mg/dL
Hgb urine dipstick: NEGATIVE
Ketones, ur: 5 mg/dL — AB
Leukocytes,Ua: NEGATIVE
Nitrite: NEGATIVE
Protein, ur: NEGATIVE mg/dL
Specific Gravity, Urine: 1.009 (ref 1.005–1.030)
pH: 6 (ref 5.0–8.0)

## 2023-12-10 LAB — CANCER ANTIGEN 19-9: CA 19-9: 5 U/mL (ref 0–35)

## 2023-12-11 LAB — URINE CULTURE

## 2023-12-12 ENCOUNTER — Telehealth: Payer: Self-pay | Admitting: *Deleted

## 2023-12-12 ENCOUNTER — Encounter: Payer: Self-pay | Admitting: Hematology

## 2023-12-12 ENCOUNTER — Encounter: Payer: Self-pay | Admitting: Nurse Practitioner

## 2023-12-12 NOTE — Telephone Encounter (Signed)
 Received fax from [facility], [place] [address] that PT to D/C skilled tx at this time, per [facility] DTaP 11-13-2023.  Needs signature then fax [phone].

## 2023-12-17 ENCOUNTER — Inpatient Hospital Stay: Payer: Medicare PPO | Admitting: Hematology

## 2023-12-17 ENCOUNTER — Other Ambulatory Visit: Payer: Medicare PPO

## 2023-12-30 ENCOUNTER — Other Ambulatory Visit: Payer: Self-pay | Admitting: Neurology

## 2023-12-31 ENCOUNTER — Other Ambulatory Visit: Payer: Self-pay

## 2023-12-31 MED ORDER — DONEPEZIL HCL 10 MG PO TABS: 10.0000 mg | ORAL_TABLET | Freq: Every day | ORAL | 1 refills | Status: AC

## 2024-01-03 ENCOUNTER — Other Ambulatory Visit: Payer: Self-pay

## 2024-01-03 DIAGNOSIS — C250 Malignant neoplasm of head of pancreas: Secondary | ICD-10-CM

## 2024-01-05 NOTE — Assessment & Plan Note (Signed)
-  stage IB (T2, N0, Mx), diagnosed in April 2024 -Presented to the emergency room on 4/17 with jaundice and dark urine -Baseline bilirubin elevated at 8.6 -CT scan showed pancreatic head lesion with involvement of the distal CBD and associated biliary ductal dilation.  No evidence of lymphadenopathy or metastatic disease to the liver -Had ERCP and stent placement on 4/19 -Underwent EUS by Dr. Meridee Score on 04/08/23 showing 30 x 23 mm pancreatic head lesion without any vascular involvement.  -The final pathology showed pancreatic adenocarcinoma -She has met pancreatobiliary surgeon Dr. Donell Beers, and she declined the surgery.  Patient understands that her cancer is not curable without surgery. -I encouraged her to consider chemotherapy for 3 to 6 months, with consolidation radiation -Patient has decided to start chemotherapy gemcitabine and cisplatin every 2 weeks.  She started on 05/27/23, she has received a total of 4 doses of today. -plan to repeat CT on 8/26 -Her tumor marker CA 19.9 has dropped significantly since she started chemo, she is likely responding to treatment. -She underwent biliary stent exchange and fiducial placement by Dr. Meridee Score on August 08, 2023. She completed SBRT on 09/26/2023

## 2024-01-06 ENCOUNTER — Other Ambulatory Visit: Payer: Self-pay

## 2024-01-06 ENCOUNTER — Inpatient Hospital Stay: Payer: Medicare PPO | Attending: Physician Assistant

## 2024-01-06 ENCOUNTER — Inpatient Hospital Stay: Payer: Medicare PPO | Admitting: Hematology

## 2024-01-06 ENCOUNTER — Encounter: Payer: Self-pay | Admitting: Hematology

## 2024-01-06 VITALS — BP 150/73 | HR 71 | Temp 97.9°F | Resp 18 | Wt 132.3 lb

## 2024-01-06 DIAGNOSIS — E860 Dehydration: Secondary | ICD-10-CM

## 2024-01-06 DIAGNOSIS — R42 Dizziness and giddiness: Secondary | ICD-10-CM | POA: Insufficient documentation

## 2024-01-06 DIAGNOSIS — N183 Chronic kidney disease, stage 3 unspecified: Secondary | ICD-10-CM | POA: Diagnosis not present

## 2024-01-06 DIAGNOSIS — H538 Other visual disturbances: Secondary | ICD-10-CM | POA: Diagnosis not present

## 2024-01-06 DIAGNOSIS — I129 Hypertensive chronic kidney disease with stage 1 through stage 4 chronic kidney disease, or unspecified chronic kidney disease: Secondary | ICD-10-CM | POA: Diagnosis not present

## 2024-01-06 DIAGNOSIS — Z79899 Other long term (current) drug therapy: Secondary | ICD-10-CM | POA: Diagnosis not present

## 2024-01-06 DIAGNOSIS — R110 Nausea: Secondary | ICD-10-CM | POA: Insufficient documentation

## 2024-01-06 DIAGNOSIS — Z95828 Presence of other vascular implants and grafts: Secondary | ICD-10-CM

## 2024-01-06 DIAGNOSIS — C250 Malignant neoplasm of head of pancreas: Secondary | ICD-10-CM | POA: Diagnosis present

## 2024-01-06 DIAGNOSIS — R112 Nausea with vomiting, unspecified: Secondary | ICD-10-CM

## 2024-01-06 DIAGNOSIS — K909 Intestinal malabsorption, unspecified: Secondary | ICD-10-CM | POA: Insufficient documentation

## 2024-01-06 LAB — CBC WITH DIFFERENTIAL (CANCER CENTER ONLY)
Abs Immature Granulocytes: 0.03 10*3/uL (ref 0.00–0.07)
Basophils Absolute: 0 10*3/uL (ref 0.0–0.1)
Basophils Relative: 0 %
Eosinophils Absolute: 0.1 10*3/uL (ref 0.0–0.5)
Eosinophils Relative: 1 %
HCT: 36.4 % (ref 36.0–46.0)
Hemoglobin: 12.5 g/dL (ref 12.0–15.0)
Immature Granulocytes: 0 %
Lymphocytes Relative: 19 %
Lymphs Abs: 1.3 10*3/uL (ref 0.7–4.0)
MCH: 31.4 pg (ref 26.0–34.0)
MCHC: 34.3 g/dL (ref 30.0–36.0)
MCV: 91.5 fL (ref 80.0–100.0)
Monocytes Absolute: 0.7 10*3/uL (ref 0.1–1.0)
Monocytes Relative: 10 %
Neutro Abs: 4.7 10*3/uL (ref 1.7–7.7)
Neutrophils Relative %: 70 %
Platelet Count: 330 10*3/uL (ref 150–400)
RBC: 3.98 MIL/uL (ref 3.87–5.11)
RDW: 12.4 % (ref 11.5–15.5)
WBC Count: 6.8 10*3/uL (ref 4.0–10.5)
nRBC: 0 % (ref 0.0–0.2)

## 2024-01-06 LAB — CMP (CANCER CENTER ONLY)
ALT: <5 U/L (ref 0–44)
AST: 21 U/L (ref 15–41)
Albumin: 3.6 g/dL (ref 3.5–5.0)
Alkaline Phosphatase: 97 U/L (ref 38–126)
Anion gap: 5 (ref 5–15)
BUN: 22 mg/dL (ref 8–23)
CO2: 28 mmol/L (ref 22–32)
Calcium: 9 mg/dL (ref 8.9–10.3)
Chloride: 105 mmol/L (ref 98–111)
Creatinine: 0.94 mg/dL (ref 0.44–1.00)
GFR, Estimated: >60 mL/min (ref >60)
Glucose, Bld: 126 mg/dL — ABNORMAL HIGH (ref 70–99)
Potassium: 3.6 mmol/L (ref 3.5–5.1)
Sodium: 138 mmol/L (ref 135–145)
Total Bilirubin: 0.9 mg/dL (ref 0.0–1.2)
Total Protein: 6.7 g/dL (ref 6.5–8.1)

## 2024-01-06 MED ORDER — PANCRELIPASE (LIP-PROT-AMYL) 36000-114000 UNITS PO CPEP: ORAL_CAPSULE | ORAL | 0 refills | Status: DC

## 2024-01-06 MED ORDER — HEPARIN SOD (PORK) LOCK FLUSH 100 UNIT/ML IV SOLN
500.0000 [IU] | Freq: Once | INTRAVENOUS | Status: AC | PRN
  Administered 2024-01-06: 500 [IU]

## 2024-01-06 MED ORDER — SODIUM CHLORIDE 0.9% FLUSH
10.0000 mL | INTRAVENOUS | Status: DC | PRN
Start: 2024-01-06 — End: 2024-01-06
  Administered 2024-01-06: 10 mL

## 2024-01-06 NOTE — Progress Notes (Signed)
[facility]   Telephone:(336) [phone] Fax:(336) [phone]   Clinic Follow up Note   Patient Care Team: [name], MD as PCP - General (Family Medicine) Malachy Mood, MD as Consulting Physician (Oncology)  Date of Service:  01/06/2024  CHIEF COMPLAINT: f/u of pancreatic cancer  CURRENT THERAPY:  Observation  Oncology History   Pancreatic cancer [facility]) -stage IB (T2, N0, Mx), diagnosed in April 2024 -Presented to the emergency room on 4/17 with jaundice and dark urine -Baseline bilirubin elevated at 8.6 -CT scan showed pancreatic head lesion with involvement of the distal CBD and associated biliary ductal dilation.  No evidence of lymphadenopathy or metastatic disease to the liver -Had ERCP and stent placement on 4/19 -Underwent EUS by Dr. Meridee Score on 04/08/23 showing 30 x 23 mm pancreatic head lesion without any vascular involvement.  -The final pathology showed pancreatic adenocarcinoma -She has met pancreatobiliary surgeon Dr. Donell Beers, and she declined the surgery.  Patient understands that her cancer is not curable without surgery. -I encouraged her to consider chemotherapy for 3 to 6 months, with consolidation radiation -Patient has decided to start chemotherapy gemcitabine and cisplatin every 2 weeks.  She started on 05/27/23, she has received a total of 4 doses of today. -plan to repeat CT on 8/26 -Her tumor marker CA 19.9 has dropped significantly since she started chemo, she is likely responding to treatment. -She underwent biliary stent exchange and fiducial placement by Dr. Meridee Score on August 08, 2023. She completed SBRT on 09/26/2023    Assessment and Plan    Pancreatic Cancer Follow-up for pancreatic cancer. Reports improvement in nausea and weight gain to 132 lbs (home weight 126-127 lbs). CT scan in December showed no gallbladder issues. Tumor markers normal on last visit, today's results still pending.  -Patient states she is not interested in  more chemotherapy due to side effects. Discussed potential symptoms of cancer progression and the role of palliative care and hospice.  She started palliative care home service last month, does not feel she needs hospice for now. - Continue current medications - Monitor for symptoms of cancer progression - Routine labs and follow-up in six weeks - Consider hospice care if more support is needed  Nausea Nausea improved with Phenergan 12.5 mg in the morning and Marinol in the morning and evening. Previous medications (Zofran, Compazine and olanzapine) discontinued due to side effects and less effectiveness. Current regimen effective. - Continue Phenergan 12.5 mg in the morning - Continue Marinol in the morning and evening  Steatorrhea Intermittent right-sided and back pain, upset stomach, and occasional severe loose bowel movements with fatty droplets. Likely due to decreased pancreatic function. Discussed Creon for enzyme replacement and potential side effects. - Prescribe Creon, starting with one tablet, up to two tablets three times a day with meals and snacks - Provide information on Creon and manufacturer assistance program  Hypertension Blood pressure remains high. Losartan dose doubled to 100 mg. No adverse effects reported. - Continue Losartan 100 mg  Dizziness and Blurred Vision Morning dizziness and blurred vision, improves during the day. Diplopia present. Further evaluation may be needed if symptoms persist. - Consider referral to ophthalmologist if symptoms persist  General Health Maintenance Discussed estrogen cream for vaginal dryness and UTI prevention. Can be used routinely or as needed based on symptoms. - Continue estrogen cream as needed for vaginal dryness and UTI prevention  Plan -She is clinically stable, nausea has been controlled lately.  Continue current medications. - Schedule follow-up visit in six  weeks -Continue palliative home care, she does not feel she  needs hospice for now.  If she develops more symptoms and needs more help at home, she is open to transition to hospice.        SUMMARY OF ONCOLOGIC HISTORY: Oncology History Overview Note   Cancer Staging  Pancreatic cancer [facility]) Staging form: Exocrine Pancreas, AJCC 8th Edition - Clinical: Stage IB (cT2, cN0, cM0) - Signed by Heilingoetter, Cassandra L, PA-C on 04/12/2023 Total positive nodes: 0     Pancreatic cancer (HCC)  03/27/2023 Imaging   CT ABDOMEN PELVIS W CONTRAST   IMPRESSION: Low-attenuation mass centered along the pancreatic head with involvement of the distal common duct with the associated biliary duct ductal dilatation and pancreatic atrophy. Changes are worrisome for neoplasm either a pancreatic neoplasm or cholangiocarcinoma. Recommend further evaluation.   No liver metastases. No pathologically enlarged upper abdominal nodes.   Diffuse colonic stool.  Normal appendix.  Few diverticula.   Portions of the pelvis are obscured by the streak artifact from the bilateral hip arthroplasties.   03/29/2023 Imaging   DG ERCP   IMPRESSION: Intraoperative fluoroscopic images show dilated intra or extrahepatic bile ducts with high-grade stenosis at the level of the distal CBD. Biliary tree is decompressed following placement of common bile duct stent.   04/08/2023 Pathology Results   CYTOLOGY - NON PAP  CASE: [id number]  PATIENT: Marie Ford  Non-Gynecological Cytology Report   FINAL MICROSCOPIC DIAGNOSIS:  A. PANCREAS, HEAD, FINE NEEDLE ASPIRATION:  - Malignant cells present (see Comment)      04/08/2023 Procedure   EUS by Dr. Meridee Score   EUS impression  - A mass was identified in the pancreatic head/ genu of the pancreas. Cytology results are pending. However, the endosonographic appearance is highly suspicious for adenocarcinoma. This was staged T2 N0 Mx by endosonographic criteria. The staging applies if malignancy is confirmed. Fine needle biopsy  performed.  - There was dilation in the common bile duct and in the common hepatic duct which measured up to 8 mm.  - One stent was visualized endosonographically within the common bile duct.  - Hyperechoic material consistent with sludge was visualized endosonographically in the gallbladder.  - Two enlarged lymph nodes were visualized in the celiac region ( level 20) and porta hepatis region. Tissue has not been obtained. However, the endosonographic appearance is suggestive of benign inflammatory change based on the visualization endosonographically.   04/12/2023 Initial Diagnosis   Pancreatic cancer (HCC)   04/12/2023 Cancer Staging   Staging form: Exocrine Pancreas, AJCC 8th Edition - Clinical: Stage IB (cT2, cN0, cM0) - Signed by Heilingoetter, Cassandra L, PA-C on 04/12/2023 Total positive nodes: 0   04/12/2023 Tumor Marker   Patient's tumor was tested for the following markers: CA 19.9. Results of the tumor marker test revealed 512.   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  Report date is 05/03/2023.   The CancerNext-Expanded gene panel offered by [facility] and includes sequencing, rearrangement, and RNA analysis for the following 71 genes:  AIP, ALK, APC, ATM, BAP1, BARD1, BMPR1A, BRCA1, BRCA2, BRIP1, CDC73, CDH1, CDK4, CDKN1B, CDKN2A, CHEK2, DICER1, FH, FLCN, KIF1B, LZTR1,MAX, MEN1, MET, MLH1, MSH2, MSH6, MUTYH, NF1, NF2, NTHL1, PALB2, PHOX2B, PMS2, POT1, PRKAR1A, PTCH1, PTEN, RAD51C,RAD51D, RB1, RET, SDHA, SDHAF2, SDHB, SDHC, SDHD, SMAD4, SMARCA4, SMARCB1, SMARCE1, STK11, SUFU, TMEM127, TP53,TSC1, TSC2 and VHL (sequencing and deletion/duplication); AXIN2, CTNNA1, EGFR, EGLN1, HOXB13, KIT, MITF, MSH3, PDGFRA, POLD1 and POLE (sequencing only);  EPCAM and GREM1 (deletion/duplication only).     05/27/2023 - 08/14/2023 Chemotherapy   Patient is on Treatment Plan : PANCREATIC Abraxane D1,8,15 + Gemcitabine D1,8,15 q28d        Discussed the use  of AI scribe software for clinical note transcription with the patient, who gave verbal consent to proceed.  History of Present Illness   The patient is a female with a known diagnosis of pancreatic cancer, currently under palliative care. She presents with ongoing issues of nausea, dizziness, and blurred vision. The nausea has been a persistent problem, but the patient reports that it has been less severe for the past two weeks. She has been managing the nausea with Phenergan and Marinol, which seem to be effective. The patient also reports experiencing dizziness, particularly in the mornings upon waking. She has been managing this by sitting on the side of the bed until the dizziness subsides. Her vision is blurry in the mornings but improves as the day progresses.  The patient also reports changes in her bowel movements, which are loose but not diarrhea. She has been experiencing this three to four times a day and notes that her stools appear fatty and float on top of the water. She has been trying to adhere to a Mediterranean diet and has been consuming ice cream regularly. The patient has noticed some weight loss but reports that her weight has stabilized and even increased slightly recently.  The patient's medications have been adjusted recently, with some stopped due to side effects such as hallucinations. She is currently taking Aricept, Phenergan, Marinol, and Losartan, among others. She has also been prescribed Creon to help with digestion.         All other systems were reviewed with the patient and are negative.  MEDICAL HISTORY:  Past Medical History:  Diagnosis Date   Chronic kidney disease    stage 3    Chronic sphenoidal sinusitis    Degenerative arthritis of hip    requirde surgery 2009   Depression 2010   Diplopia 2011   in left lateral gaze    Diplopia    " my eyes dont track together, i have corrective prisms in my eye glasses"    Diverticulosis 2006   noted in a  colonoscopy   Esophageal reflux 2013   Hypertension    IGT (impaired glucose tolerance) 2011   denies hx of diabetes    Insomnia 2009   Medical history non-contributory    Mixed hyperlipidemia 2000   Osteoarthritis    Osteopenia 2005   Other and unspecified hyperlipidemia 2000   Other specified disease of sebaceous glands    Palpitations 2009   secondary to PSVT   Parkinson disease (HCC)    PNA (pneumonia) 2014   Psoriasis 2004   PSVT (paroxysmal supraventricular tachycardia) (HCC) 2009   "report it has been some time since i had that "    Situational stress    Urinary tract infection    thru wed 05/15/23, completed 3d antibiotics   Venous insufficiency since 2017    SURGICAL HISTORY: Past Surgical History:  Procedure Laterality Date   BILIARY BRUSHING  03/29/2023   Procedure: BILIARY BRUSHING;  Surgeon: Hilarie Fredrickson, MD;  Location: [facility] ENDOSCOPY;  Service: Gastroenterology;;   BILIARY DILATION  08/08/2023   Procedure: BILIARY DILATION;  Surgeon: Lemar Lofty., MD;  Location: [facility]COPY;  Service: Gastroenterology;;   BILIARY STENT PLACEMENT  03/29/2023   Procedure: BILIARY STENT PLACEMENT;  Surgeon: Yancey Flemings  N, MD;  Location: [facility] ENDOSCOPY;  Service: Gastroenterology;;   BILIARY STENT PLACEMENT N/A 08/08/2023   Procedure: BILIARY STENT PLACEMENT;  Surgeon: Lemar Lofty., MD;  Location: [facility] ENDOSCOPY;  Service: Gastroenterology;  Laterality: N/A;   BIOPSY  04/08/2023   Procedure: BIOPSY;  Surgeon: Meridee Score Netty Starring., MD;  Location: [facility] ENDOSCOPY;  Service: Gastroenterology;;   CATARACT EXTRACTION W/ INTRAOCULAR LENS  IMPLANT, BILATERAL     COLONOSCOPY  01/30/2010   Jarold Motto   ENDOSCOPIC RETROGRADE CHOLANGIOPANCREATOGRAPHY (ERCP) WITH PROPOFOL N/A 08/08/2023   Procedure: ENDOSCOPIC RETROGRADE CHOLANGIOPANCREATOGRAPHY (ERCP) WITH PROPOFOL;  Surgeon: Lemar Lofty., MD;  Location: [facility]COPY;  Service: Gastroenterology;  Laterality: N/A;   ERCP  N/A 03/29/2023   Procedure: ENDOSCOPIC RETROGRADE CHOLANGIOPANCREATOGRAPHY (ERCP);  Surgeon: Hilarie Fredrickson, MD;  Location: [facility] ENDOSCOPY;  Service: Gastroenterology;  Laterality: N/A;   ESOPHAGOGASTRODUODENOSCOPY N/A 08/08/2023   Procedure: ESOPHAGOGASTRODUODENOSCOPY (EGD);  Surgeon: Lemar Lofty., MD;  Location: [facility];  Service: Gastroenterology;  Laterality: N/A;   ESOPHAGOGASTRODUODENOSCOPY (EGD) WITH PROPOFOL N/A 04/08/2023   Procedure: ESOPHAGOGASTRODUODENOSCOPY (EGD) WITH PROPOFOL;  Surgeon: Meridee Score Netty Starring., MD;  Location: [facility] ENDOSCOPY;  Service: Gastroenterology;  Laterality: N/A;   ETHMOIDECTOMY Left 11/17/2020   Procedure: LEFT SIDED TOTAL ETHMOIDECTOMY;  Surgeon: Drema Halon, MD;  Location: [facility];  Service: ENT;  Laterality: Left;   EUS N/A 04/08/2023   Procedure: UPPER ENDOSCOPIC ULTRASOUND (EUS) LINEAR;  Surgeon: Lemar Lofty., MD;  Location: [facility] ENDOSCOPY;  Service: Gastroenterology;  Laterality: N/A;   EUS N/A 08/08/2023   Procedure: UPPER ENDOSCOPIC ULTRASOUND (EUS) LINEAR;  Surgeon: Lemar Lofty., MD;  Location: [facility] ENDOSCOPY;  Service: Gastroenterology;  Laterality: N/A;   EYE SURGERY     FIDUCIAL MARKER PLACEMENT  08/08/2023   Procedure: FIDUCIAL MARKER PLACEMENT;  Surgeon: Lemar Lofty., MD;  Location: [facility];  Service: Gastroenterology;;   FINE NEEDLE ASPIRATION N/A 04/08/2023   Procedure: FINE NEEDLE ASPIRATION (FNA) LINEAR;  Surgeon: Lemar Lofty., MD;  Location: [facility];  Service: Gastroenterology;  Laterality: N/A;   HEMORRHOID SURGERY     IR IMAGING GUIDED PORT INSERTION  05/17/2023   JOINT REPLACEMENT     LASIK     left medial and superior rectus muscle recesion for diplopia correction     REMOVAL OF STONES  08/08/2023   Procedure: REMOVAL OF SLUDGE;  Surgeon: Meridee Score Netty Starring., MD;  Location: [facility];  Service: Gastroenterology;;   SINUS ENDO WITH FUSION Left  11/17/2020   Procedure: SINUS ENDOSCOPY WITH FUSION NAVIGATION;  Surgeon: Drema Halon, MD;  Location: [facility];  Service: ENT;  Laterality: Left;   SPHENOIDECTOMY Left 11/17/2020   Procedure: LEFT SIDED SPHENOIDECTOMY WITH TISSUE REMOVAL;  Surgeon: Drema Halon, MD;  Location: [facility];  Service: ENT;  Laterality: Left;   SPHINCTEROTOMY  03/29/2023   Procedure: SPHINCTEROTOMY;  Surgeon: Hilarie Fredrickson, MD;  Location: [facility] ENDOSCOPY;  Service: Gastroenterology;;   Francine Graven REMOVAL  08/08/2023   Procedure: STENT REMOVAL;  Surgeon: Lemar Lofty., MD;  Location: [facility] ENDOSCOPY;  Service: Gastroenterology;;   surgery of right eye rectus muscles for diplpia correction   2019   TOTAL HIP ARTHROPLASTY Right 2010   TOTAL HIP ARTHROPLASTY Left 08/19/2019   Procedure: TOTAL HIP ARTHROPLASTY ANTERIOR APPROACH;  Surgeon: Ollen Gross, MD;  Location: [facility];  Service: Orthopedics;  Laterality: Left;    TURBINATE REDUCTION Left 11/17/2020   Procedure: LEFT SIDED TURBINATE REDUCTION;  Surgeon: [name],  Kristine Garbe, MD;  Location: [facility];  Service: ENT;  Laterality: Left;   URETHRAL SLING     midurethral sling with TVT Exact and Cystoscopy    I have reviewed the social history and family history with the patient and they are unchanged from previous note.  ALLERGIES:  is allergic to erythromycin, macrobid [nitrofurantoin], keflex [cephalexin], levofloxacin, penicillins, and sulfamethoxazole-trimethoprim.  MEDICATIONS:  Current Outpatient Medications  Medication Sig Dispense Refill   lipase/protease/amylase (CREON) 36000 UNITS CPEP capsule Take 2 capsules (72,000 Units total) by mouth 3 (three) times daily with meals. May also take 1 capsule (36,000 Units total) as needed (with snacks - up to 4 snacks daily). 180 capsule 0   ALPRAZolam (XANAX) 0.5 MG tablet Take 0.25 mg by mouth as needed for anxiety. Takes as needed for sleep      AUVELITY 45-105 MG TBCR Take 1 tablet by mouth every evening.     buPROPion (WELLBUTRIN XL) 150 MG 24 hr tablet Take 150 mg by mouth daily.     Calcium Carbonate Antacid (CALCIUM CARBONATE PO) Take by mouth.     carbidopa-levodopa (SINEMET CR) 50-200 MG tablet Take 1 tablet by mouth at bedtime. 90 tablet 3   carbidopa-levodopa (SINEMET IR) 25-100 MG tablet Take 1.5 tablets by mouth 4 (four) times daily. -Try to separate Sinemet from food (especially protein-rich foods like meat, dairy, eggs) by about 30-60 mins - this will help the absorption of the medication. If you have some nausea with the medication, you can take it with some light food like crackers or ginger ale. Try taking at 7am, 10am, 1pm and 4pm and then at bedtime take the extended release(Sinemet CR) 540 tablet 4   donepezil (ARICEPT) 10 MG tablet Take 1 tablet (10 mg total) by mouth at bedtime. 90 tablet 1   dronabinol (MARINOL) 2.5 MG capsule TAKE 1 CAPSULE BY MOUTH TWICE DAILY DAILY BEFORE A MEAL *REFRIGERATE* *DO NOT FILL UNTIL 8/27* 60 capsule 0   losartan (COZAAR) 50 MG tablet Take 1 tablet (50 mg total) by mouth daily. 90 tablet 1   magic mouthwash (multi-ingredient) oral suspension Take 5 mLs by mouth 4 (four) times daily. 240 mL 1   Multiple Vitamin (MULTIVITAMIN WITH MINERALS) TABS tablet Take 1 tablet by mouth daily. Women's Multivitamin     OVER THE COUNTER MEDICATION Take 1 tablet by mouth daily as needed (constipation).     Polyethyl Glycol-Propyl Glycol (SYSTANE) 0.4-0.3 % SOLN Place 1 drop into both eyes 3 (three) times daily as needed (dry/irritated eyes.).     polyethylene glycol (MIRALAX / GLYCOLAX) 17 g packet Take 17 g by mouth daily.     promethazine (PHENERGAN) 25 MG tablet Take 25 mg by mouth every 6 (six) hours as needed for nausea or vomiting.     triamcinolone (NASACORT) 55 MCG/ACT AERO nasal inhaler Place 2 sprays into the nose daily. Use at night (Patient taking differently: Place 1 spray into the nose daily.  Use at night) 1 each 12   No current facility-administered medications for this visit.    PHYSICAL EXAMINATION: ECOG PERFORMANCE STATUS: 1 - Symptomatic but completely ambulatory  Vitals:   01/06/24 1359  BP: (!) 150/73  Pulse: 71  Resp: 18  Temp: 97.9 F (36.6 C)  SpO2: 100%   Wt Readings from Last 3 Encounters:  01/06/24 132 lb 4.8 oz (60 kg)  12/09/23 136 lb 12.8 oz (62.1 kg)  10/22/23 137 lb 12.8 oz (62.5 kg)  GENERAL:alert, no distress and comfortable SKIN: skin color, texture, turgor are normal, no rashes or significant lesions EYES: normal, Conjunctiva are pink and non-injected, sclera clear NECK: supple, thyroid normal size, non-tender, without nodularity LYMPH:  no palpable lymphadenopathy in the cervical, axillary  LUNGS: clear to auscultation and percussion with normal breathing effort HEART: regular rate & rhythm and no murmurs and no lower extremity edema ABDOMEN:abdomen soft, non-tender and normal bowel sounds Musculoskeletal:no cyanosis of digits and no clubbing  NEURO: alert & oriented x 3 with fluent speech, no focal motor/sensory deficits   LABORATORY DATA:  I have reviewed the data as listed    Latest Ref Rng & Units 01/06/2024    1:38 PM 12/09/2023   12:52 PM 12/01/2023   10:49 PM  CBC  WBC 4.0 - 10.5 K/uL 6.8  7.5  9.0   Hemoglobin 12.0 - 15.0 g/dL 16.1  09.6  04.5   Hematocrit 36.0 - 46.0 % 36.4  37.2  43.6   Platelets 150 - 400 K/uL 330  289  310         Latest Ref Rng & Units 01/06/2024    1:38 PM 12/09/2023   12:52 PM 12/01/2023   10:49 PM  CMP  Glucose 70 - 99 mg/dL 409  811  914   BUN 8 - 23 mg/dL 22  18  19    Creatinine 0.44 - 1.00 mg/dL 7.82  9.56  2.13   Sodium 135 - 145 mmol/L 138  138  134   Potassium 3.5 - 5.1 mmol/L 3.6  3.2  3.2   Chloride 98 - 111 mmol/L 105  104  99   CO2 22 - 32 mmol/L 28  26  21    Calcium 8.9 - 10.3 mg/dL 9.0  9.0  9.3   Total Protein 6.5 - 8.1 g/dL 6.7  6.9  7.4   Total Bilirubin 0.0 - 1.2 mg/dL  0.9  0.9  1.9   Alkaline Phos 38 - 126 U/L 97  163  143   AST 15 - 41 U/L 21  34  74   ALT 0 - 44 U/L 5  10  14        RADIOGRAPHIC STUDIES: I have personally reviewed the radiological images as listed and agreed with the findings in the report. No results found.    No orders of the defined types were placed in this encounter.  Patient's friend and daughter had many questions, all questions were answered to the satisfaction of. The patient knows to call the clinic with any problems, questions or concerns. No barriers to learning was detected. The total time spent in the appointment was 40 minutes.     Malachy Mood, MD 01/06/2024

## 2024-01-07 ENCOUNTER — Other Ambulatory Visit: Payer: Self-pay | Admitting: Nurse Practitioner

## 2024-01-07 LAB — CANCER ANTIGEN 19-9: CA 19-9: 10 U/mL (ref 0–35)

## 2024-01-07 MED ORDER — DRONABINOL 2.5 MG PO CAPS: 2.5000 mg | ORAL_CAPSULE | Freq: Two times a day (BID) | ORAL | 0 refills | Status: DC

## 2024-01-09 ENCOUNTER — Other Ambulatory Visit: Payer: Self-pay

## 2024-01-09 ENCOUNTER — Encounter (HOSPITAL_COMMUNITY): Payer: Self-pay

## 2024-01-09 ENCOUNTER — Telehealth: Payer: Self-pay

## 2024-01-09 ENCOUNTER — Emergency Department (HOSPITAL_COMMUNITY)
Admission: EM | Admit: 2024-01-09 | Discharge: 2024-01-09 | Disposition: A | Discharge status: Home / Self Care | Payer: Medicare PPO | Attending: Emergency Medicine | Admitting: Emergency Medicine

## 2024-01-09 ENCOUNTER — Emergency Department (HOSPITAL_COMMUNITY): Payer: Medicare PPO

## 2024-01-09 DIAGNOSIS — N189 Chronic kidney disease, unspecified: Secondary | ICD-10-CM | POA: Diagnosis not present

## 2024-01-09 DIAGNOSIS — R1013 Epigastric pain: Secondary | ICD-10-CM | POA: Diagnosis not present

## 2024-01-09 DIAGNOSIS — R002 Palpitations: Secondary | ICD-10-CM | POA: Insufficient documentation

## 2024-01-09 DIAGNOSIS — E876 Hypokalemia: Secondary | ICD-10-CM | POA: Diagnosis not present

## 2024-01-09 DIAGNOSIS — G20C Parkinsonism, unspecified: Secondary | ICD-10-CM | POA: Diagnosis not present

## 2024-01-09 DIAGNOSIS — Z8507 Personal history of malignant neoplasm of pancreas: Secondary | ICD-10-CM | POA: Diagnosis not present

## 2024-01-09 DIAGNOSIS — I129 Hypertensive chronic kidney disease with stage 1 through stage 4 chronic kidney disease, or unspecified chronic kidney disease: Secondary | ICD-10-CM | POA: Insufficient documentation

## 2024-01-09 LAB — BASIC METABOLIC PANEL
Anion gap: 10 (ref 5–15)
BUN: 21 mg/dL (ref 8–23)
CO2: 25 mmol/L (ref 22–32)
Calcium: 9 mg/dL (ref 8.9–10.3)
Chloride: 106 mmol/L (ref 98–111)
Creatinine, Ser: 0.93 mg/dL (ref 0.44–1.00)
GFR, Estimated: >60 mL/min (ref >60)
Glucose, Bld: 94 mg/dL (ref 70–99)
Potassium: 3.3 mmol/L — ABNORMAL LOW (ref 3.5–5.1)
Sodium: 141 mmol/L (ref 135–145)

## 2024-01-09 LAB — TROPONIN I (HIGH SENSITIVITY): Troponin I (High Sensitivity): 5 ng/L (ref <18)

## 2024-01-09 LAB — CBC
HCT: 39.5 % (ref 36.0–46.0)
Hemoglobin: 13.2 g/dL (ref 12.0–15.0)
MCH: 32.4 pg (ref 26.0–34.0)
MCHC: 33.4 g/dL (ref 30.0–36.0)
MCV: 97.1 fL (ref 80.0–100.0)
Platelets: 371 10*3/uL (ref 150–400)
RBC: 4.07 MIL/uL (ref 3.87–5.11)
RDW: 12.6 % (ref 11.5–15.5)
WBC: 8.1 10*3/uL (ref 4.0–10.5)
nRBC: 0 % (ref 0.0–0.2)

## 2024-01-09 LAB — MAGNESIUM: Magnesium: 1.9 mg/dL (ref 1.7–2.4)

## 2024-01-09 MED ORDER — POTASSIUM CHLORIDE 20 MEQ PO PACK
40.0000 meq | PACK | Freq: Once | ORAL | Status: AC
  Administered 2024-01-09: 40 meq via ORAL
  Filled 2024-01-09: qty 2

## 2024-01-09 MED ORDER — POTASSIUM CHLORIDE 20 MEQ PO PACK: 20.0000 meq | PACK | Freq: Every day | ORAL | 0 refills | Status: DC

## 2024-01-09 NOTE — Discharge Instructions (Addendum)
Your potassium was low today this can sometimes cause palpitations.  It is difficult to say whether the symptoms are related to starting Creon but given that that is the only change you had recently I would stop this medication until you discuss the symptoms with your oncologist.  Take potassium supplement for the next 7 days and then you will need to have your potassium levels rechecked.  If you develop any new or worsening symptoms despite stopping Creon medication please talk with your regular doctor return to the emergency department.

## 2024-01-09 NOTE — Telephone Encounter (Signed)
Patients daughter called in stating patient started Creon on Tuesday after picking up the prescription. Patient woke up this morning with shaking and having heart palpitations. I spoke with Janett Billow RN about the symptoms and she instructed me to tell the daughter to take her mom to the ER she may be having an allergic reaction to the medication. Daughter voiced full understanding and had no further questions at this time.

## 2024-01-09 NOTE — ED Provider Notes (Incomplete)
[place] EMERGENCY DEPARTMENT AT [facility] Provider Note   CSN: [id number] Arrival date & time: 01/09/24  6578     History {Add pertinent medical, surgical, social history, OB history to HPI:1} Chief Complaint  Patient presents with   Palpitations    Marie Ford is a 78 y.o. female.   Palpitations      Home Medications Prior to Admission medications   Medication Sig Start Date End Date Taking? Authorizing Provider  potassium chloride (KLOR-CON) 20 MEQ packet Take 20 mEq by mouth daily for 7 days. 01/09/24 01/16/24 Yes Rosezella Rumpf, PA-C  ALPRAZolam Prudy Feeler) 0.5 MG tablet Take 0.25 mg by mouth as needed for anxiety. Takes as needed for sleep    [provider]  AUVELITY 45-105 MG TBCR Take 1 tablet by mouth every evening.    [provider]  buPROPion (WELLBUTRIN XL) 150 MG 24 hr tablet Take 150 mg by mouth daily.    [provider]  Calcium Carbonate Antacid (CALCIUM CARBONATE PO) Take by mouth.    [provider]  carbidopa-levodopa (SINEMET CR) 50-200 MG tablet Take 1 tablet by mouth at bedtime. 10/02/23   Anson Fret, MD  carbidopa-levodopa (SINEMET IR) 25-100 MG tablet Take 1.5 tablets by mouth 4 (four) times daily. -Try to separate Sinemet from food (especially protein-rich foods like meat, dairy, eggs) by about 30-60 mins - this will help the absorption of the medication. If you have some nausea with the medication, you can take it with some light food like crackers or ginger ale. Try taking at 7am, 10am, 1pm and 4pm and then at bedtime take the extended release(Sinemet CR) 10/02/23   Anson Fret, MD  donepezil (ARICEPT) 10 MG tablet Take 1 tablet (10 mg total) by mouth at bedtime. 12/31/23   Anson Fret, MD  dronabinol (MARINOL) 2.5 MG capsule Take 1 capsule (2.5 mg total) by mouth 2 (two) times daily before lunch and supper. 01/07/24   Carlean Jews, NP  lipase/protease/amylase (CREON) 36000 UNITS CPEP  capsule Take 2 capsules (72,000 Units total) by mouth 3 (three) times daily with meals. May also take 1 capsule (36,000 Units total) as needed (with snacks - up to 4 snacks daily). 01/06/24   Malachy Mood, MD  losartan (COZAAR) 50 MG tablet Take 1 tablet (50 mg total) by mouth daily. 03/30/23 09/26/23  Almon Hercules, MD  magic mouthwash (multi-ingredient) oral suspension Take 5 mLs by mouth 4 (four) times daily. 06/28/23   Malachy Mood, MD  Multiple Vitamin (MULTIVITAMIN WITH MINERALS) TABS tablet Take 1 tablet by mouth daily. Women's Multivitamin    [provider]  OVER THE COUNTER MEDICATION Take 1 tablet by mouth daily as needed (constipation).    [provider]  Polyethyl Glycol-Propyl Glycol (SYSTANE) 0.4-0.3 % SOLN Place 1 drop into both eyes 3 (three) times daily as needed (dry/irritated eyes.).    [provider]  polyethylene glycol (MIRALAX / GLYCOLAX) 17 g packet Take 17 g by mouth daily.    [provider]  promethazine (PHENERGAN) 25 MG tablet Take 25 mg by mouth every 6 (six) hours as needed for nausea or vomiting.    [provider]  triamcinolone (NASACORT) 55 MCG/ACT AERO nasal inhaler Place 2 sprays into the nose daily. Use at night Patient taking differently: Place 1 spray into the nose daily. Use at night 11/01/20   Drema Halon, MD      Allergies    Erythromycin,  Macrobid [nitrofurantoin], Keflex [cephalexin], Levofloxacin, Penicillins, and Sulfamethoxazole-trimethoprim    Review of Systems   Review of Systems  Cardiovascular:  Positive for palpitations.    Physical Exam Updated Vital Signs BP (!) 179/99   Pulse 67   Temp 97.8 F (36.6 C) (Oral)   Resp 16   Ht 5\' 6"  (1.676 m)   Wt 59.4 kg   SpO2 100%   BMI 21.14 kg/m  Physical Exam  ED Results / Procedures / Treatments   Labs (all labs ordered are listed, but only abnormal results are displayed) Labs Reviewed  BASIC METABOLIC PANEL - Abnormal; Notable for the  following components:      Result Value   Potassium 3.3 (*)    All other components within normal limits  CBC  MAGNESIUM  TROPONIN I (HIGH SENSITIVITY)    EKG EKG Interpretation Date/Time:  Thursday January 09 2024 09:36:05 EST Ventricular Rate:  79 PR Interval:  157 QRS Duration:  95 QT Interval:  399 QTC Calculation: 458 R Axis:   66  Text Interpretation: Sinus rhythm Artifact in lead(s) I II III aVR aVL aVF Confirmed by Lorre Nick ([id number]) on 01/09/2024 2:01:06 PM  Radiology DG Chest 2 View Result Date: 01/09/2024 CLINICAL DATA:  Chest pain EXAM: CHEST - 2 VIEW COMPARISON:  12/01/2023 FINDINGS: Right Port-A-Cath remains in place, unchanged. Mild hyperinflation, stable. Heart and mediastinal contours are within normal limits. No focal opacities or effusions. No acute bony abnormality. IMPRESSION: No active cardiopulmonary disease. Electronically Signed   By: Charlett Nose M.D.   On: 01/09/2024 10:44    Procedures Procedures  {Document cardiac monitor, telemetry assessment procedure when appropriate:1}  Medications Ordered in ED Medications  potassium chloride (KLOR-CON) packet 40 mEq (40 mEq Oral Given 01/09/24 1521)    ED Course/ Medical Decision Making/ A&P   {   Click here for ABCD2, HEART and other calculatorsREFRESH Note before signing :1}                              Medical Decision Making Amount and/or Complexity of Data Reviewed Labs: ordered. Radiology: ordered.   ***  {Document critical care time when appropriate:1} {Document review of labs and clinical decision tools ie heart score, Chads2Vasc2 etc:1}  {Document your independent review of radiology images, and any outside records:1} {Document your discussion with family members, caretakers, and with consultants:1} {Document social determinants of health affecting pt's care:1} {Document your decision making why or why not admission, treatments were needed:1} Final Clinical Impression(s) / ED  Diagnoses Final diagnoses:  Palpitations  Hypokalemia    Rx / DC Orders ED Discharge Orders          Ordered    potassium chloride (KLOR-CON) 20 MEQ packet  Daily        01/09/24 1519

## 2024-01-09 NOTE — Progress Notes (Signed)
[facility] Liaison Note: This patient is currently enrolled in [facility] outpatient-based palliative care.  Please call for any outpatient based palliative care related questions or concerns. Thank you, Henderson Newcomer, LPN [facility] Liaison [phone]

## 2024-01-09 NOTE — ED Triage Notes (Addendum)
Patient started a new medication (Creon) 2 days ago. Began feeling like her heart was fluttering since yesterday. Pain under her right breast. Vomited once last night. No diarrhea. Missed a dose of her parkinsons medication yesterday.

## 2024-01-11 ENCOUNTER — Encounter: Payer: Self-pay | Admitting: Hematology

## 2024-01-17 ENCOUNTER — Other Ambulatory Visit: Payer: Self-pay

## 2024-01-17 DIAGNOSIS — C250 Malignant neoplasm of head of pancreas: Secondary | ICD-10-CM

## 2024-01-22 ENCOUNTER — Ambulatory Visit (HOSPITAL_COMMUNITY)
Admission: RE | Admit: 2024-01-22 | Discharge: 2024-01-22 | Disposition: A | Discharge status: Home / Self Care | Payer: Medicare PPO | Source: Ambulatory Visit | Attending: Hematology | Admitting: Hematology

## 2024-01-22 DIAGNOSIS — C250 Malignant neoplasm of head of pancreas: Secondary | ICD-10-CM | POA: Insufficient documentation

## 2024-01-29 ENCOUNTER — Other Ambulatory Visit (HOSPITAL_COMMUNITY): Payer: Self-pay

## 2024-02-12 ENCOUNTER — Ambulatory Visit: Payer: Medicare PPO | Admitting: Neurology

## 2024-02-12 ENCOUNTER — Encounter: Payer: Self-pay | Admitting: Neurology

## 2024-02-12 VITALS — BP 126/73 | HR 74 | Ht 66.0 in | Wt 126.0 lb

## 2024-02-12 DIAGNOSIS — G20A1 Parkinson's disease without dyskinesia, without mention of fluctuations: Secondary | ICD-10-CM

## 2024-02-12 NOTE — Progress Notes (Signed)
 [name]    Provider:  Dr [name] Requesting Provider: [name], MD Primary Care Provider:  [name], MD  CC:  Headache, memory loss, gait abnormality  02/12/2024:  she is progressing as far as Parkinson's Disease, she is having episodes of shaking which as she states it happens when she is overwhelmed or possibly when the medication is wearing off.  Discussed that I did speak with patient initially and had her establish with Dr. Joselyn Arrow as a movement disorder specialist. she had seen Dr. Evern Bio and I sent her there to establish care. I was very honest that I do think that a movement disorder specialist is appropriate at this time but we are happy to follow as well for any needs. She had seen Dr. Evern Bio and I will refer her back for continued care. Also [name] may recommend another parkinson's disease specialist in the area. She is following up with her psychiatrist Tamela Oddi at the end of the month for anxiety, since having pancreatic cancer she is just worn out and dealing with lots of emptions and she has a therapist. She has had neuropsychiatric testing and canceled the follow up due to pancreatic cancer. No dyskinesias. These "episodes" are infrequent, daughter spent time with her recently and they did many activities and went to dinner and went to a game and she did not have any noticeable tremors or gait but she does say she gets trembly and her legs get trembly bt it may not be the norm. SH etakes the medications at 7, 10, 1 and 4 and then takes the CR after dinner.   Patient complains of symptoms per HPI as well as the following symptoms: anxiety . Pertinent negatives and positives per HPI. All others negative   10/02/2023: She is feeling better than she did yesterday. Last Thursday was her last treatment for pancreatic cancer. She gets shaky, she has cramping, weak legs and arms after eating, her handwriting, Somtime sint he morning when she  wake sup. Every morning. I don;t see any shaking today. Asked her to discuss nausea and problems after eating. Had chemo and radiation. Discussed getting repeat with [name] but not at this time feel tere would be confounding results based on recent chemo and radiation jst recently completed and she does not wish to have anynmore treatment. She is shaky in the morning.Parkinsons can cause small fiber autnomic symptoms like nausea but at this time I think it is likely from the treatments and cancer will follow up in 4-5 months  Patient complains of symptoms per HPI as well as the following symptoms: nausea, decreaed appetite . Pertinent negatives and positives per HPI. All others negative   05/30/2023: recent diagnosis of pancreatic cancer and not feeling well, had first chemo, feels cognition is worsening. She takes friend is here and provides much information.1.5 tabs 4x a day, 7am,11pm,3pm,7pm. She goes to bed between 8-930pm. We increaed dose because the tremors were worsening since increasing dose its helped. She has lost weight over last 1.5 years. Here with [name] who is moving into [place] like the patient. Friend thinks her cognition has really declined and sometimes she will ask bizarre questions but had a recent UTI finished treatment, friends says she has a poor appetite and patient said 3 weeks but its been 3 months or more, forgetting things. First chemo was this past Monday. She is more tired and tired a great deal and naps every day. She forgets words. She sleeps in until  9-10 every day. She oresented in April with jaundice and found Obstructive jaundice due to pancreatic mass concerning for malignancy: Reports normal colo over 10 years ago also Hyperbilirubinemia/elevated liver enzymes. She has word-finding problems, cognitive impairment.   Patient complains of symptoms per HPI as well as the following symptoms: fatigue . Pertinent negatives and positives per HPI. All others  negative   10/17/2022: Patient states she wears off about 1/2-3/4 hr before her next dose is due. Also she feels her cognition, decision making, word finding, forgetfullness, fatigue is worsening. We will increase sinemet to every 4 hours. She has started TMS and feels it is helping her mood. She hasn't been crying as much and she is feeling better. Patient requests follow up neurocognitive testing with Dr. [name], she is willing to wait 8 months to a year, she had testing in 2022 and feels cognition is worsening, she has parkinson's disease.  Patient complains of symptoms per HPI as well as the following symptoms: cognitive decline . Pertinent negatives and positives per HPI. All others negative   04/11/2022: she moved back to [place] permanently, her dog passed away and this hurt her heart. She is moving into [employer]. She gets very tired easily, she has fatigue even if she had a good night's sleep, she has dry mouth in the morning, do not feel refreshed but also don't fall asleep during the day or nap so unlikely. BP is great.  She had a comprehensive examination 2 weeks ago annual and had blood work completed. She thinks stress is a lot of it and she had a sinus surgery and she is healing. She exercises 20 minutes a day. No falls. She went to physical therapy and she is doing all the exercises. She walks. She feels better after resting. Tremors return after 4-5 hours and that is when she takes a sinemet 730, 1230, 530-6.  The medicine might make her a little tired. She is also on alprazolam at night and takes it for stress during the day and that may make her tired as well. She has chronic diplopia and has prisms in her glasses continuous had 2 surgeries since 2011  she corrected both sides at [facility] for strabismus last surgery 2019. She had an EKG with [name] and it was good per patient. Here with friend. She is not as alert or as detailed stressful house move. She has a lot of anxiety  recently.   Patient complains of symptoms per HPI as well as the following symptoms: stress . Pertinent negatives and positives per HPI. All others negative   HPI 01/16/2021: [facility] patient  ELLEE WAWRZYNIAK is a 78 y.o. female here as requested by [name], MD for "headaches, ataxia and cognitive impairment".  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  I reviewed Dr. [name] notes: Over the last year she has had chronic symptoms of headaches, CT scan of the sinuses in April showed sphenoid sinusitis, she had similar findings the year before, she saw Dr. [name] in ENT in April of this year and he did not feel the sinusitis required surgery was necessarily the cause of her  headaches.  Associated with her headaches have been increasing symptoms of anxiety and depression, much of this related to stress, recently improved on fluoxetine, however the headaches fatigue and nausea have continued.  She also complains of ataxia over the last few months a few times a week as well as cognitive impairment short-term memory deficits and she is interested in more formal neuropsychological testing.  MRI of the brain in October was unremarkable but also showed the sphenoid sinusitis.   She is here with her friend of 35 years, headaches are improved since surgery and antibiotic. Over the last 2 years she has had cognitive decline and physicial decline and lots of stress, hip replace,ent, 18-year relationship ended, she had to sell a house sh eloved and some  communication problems with daughter, headaches are not so much the issue anymore. She has tremors under stress and feels "jittery". The left side doesn't work as well on the left side, she has imbalance on the left side, grip is weakness on the left side, hand writing has changed has gotten smaller. She feels clumsy and slow, her friend provides much information and says she is having difficulty with fly fishing and she is a well known for her fly fishing, her gait is different and she is weak, chronic fatigue, smell and taste is pretty much intact but not great she can't tell the difference between teas, some of the scent is gone, swallowing is ok, tremor is resting and with action, worse when under stress, left is worse. Friend has noticed her memory is declining more than she thinks is normal, she is not sleeping well at all, trouble falling asleep. She tries to meditate, she may try to eat something, she takes Xanax sometimes. Her gait is different, her left side is affected, hard to move that side and she doesn't swing her left arm. She walks her dog every day. Difficulty with the left hand with coordination, no falls, feels like she is shuffling, not lightheaded, she feels unbalanced, difficulty tieing the flys for fly fishing. Father died of stroke, no parkinson's disease. She is having difficulty remembering to do things, she loses steps, she has compensation techniques and uses her calendars, not getting lost, no hallucinations or delusions, no acting out dreams, but she is having vivid dreams, more dreaming, much more emotional and depressed but not overreacting. Friend states he looks sadder more flat in affect.   Reviewed notes, labs and imaging from outside physicians, which showed:   October 07, 2020: CMP with BUN 24, creatinine 1.12, glucose 101, otherwise unremarkable, TSH 2.59, sed rate 17 normal, CBC normal.  CT Sinuses 11/10/2020: reviewed report and personally reviewed images (also reviewed  with patient) FINDINGS: Paranasal sinuses:   Frontal: Aplastic on the left and hypoplastic on the right   Ethmoid: Frothy secretions in the posterior left ethmoid air cell.   Maxillary: Small presumed retention cysts on the right more than left.   Sphenoid: Chronic left sphenoid sinusitis with complete opacification and sclerotic wall thickening. Sclerotic wall thickening has progressed and the ostium appears covered by bone currently.   Right ostiomeatal unit: Patent.   Left ostiomeatal unit: Patent.   Nasal passages: Patent. Intact nasal septum is essentially midline.   Anatomy: No pneumatization superior to anterior ethmoid notches. Sellar sphenoid pneumatization pattern. No dehiscence of carotid or optic canals. No onodi cell.   Other: No incidental finding on soft tissue windows.   IMPRESSION: 1. Chronic left sphenoid sinusitis with progressive sclerotic wall thickening  since May 2021. The ostium now appears covered by calcification. 2. Continued frothy secretions in the left posterior ethmoid air Cell.  MRI brain 09/30/2020: Mild  chronic microvascular ischemic changes, typical for age.(personally reviewed and agree with findings) reviewed report and personally reviewed images (also reviewed with patient)  10/05/2021: Here for follow up. Was seen at [facility] by a movement disorder specialist, her recommendations were to continue Sinemet, we reviewed the notes and the appointment at [facility] together with her good friend today, we reviewed resources in the community and I provided her with documentation on Parkinson's groups, Parkinson's specific exercise groups, patient, also patient feels she is depressed and has anxiety, would discuss with primary care [name], per patient she has been tried on multiple medications, we did discuss at this point referring her to [facility] that can help her with medication management and also refer her to a  therapist. I can refer her for physical therapy for Parkinson's disease specific PT. Headaches resolved after sphenoidectomy.   Interval history 05/03/2021: DAT scan consistent with parkinsonian disorder. We discussed today. I recommend patient be transitioned to [facility] Movements Disorder Team. They are the leaders in parkinson's Disease in this area.   She has tremor in her left hand at rest, worse with stress, she has some cognitive impairments and weak, she feels she can;t find the words and can't understand directions. We discussed speech therapy, they are concerned about driving and he has a weakness in visual memory, we discussed a driving test, discussed regular skin tests with a dermatologist.     DAT Scan 02/09/2021: Marked decreased activity within the LEFT and RIGHT striatum. Slightly greater deficit on the RIGHT. This pattern can be found in Parkinsonian syndromes pathology.  Review of Systems: Patient complains of symptoms per HPI as well as the following symptoms: anxiety . Pertinent negatives and positives per HPI. All others negative .   Social History   Socioeconomic History   Marital status: Single    Spouse name: Not on file   Number of children: 1   Years of education: Not on file   Highest education level: Doctorate  Occupational History   Occupation: retired  Tobacco Use   Smoking status: Never   Smokeless tobacco: Never  Vaping Use   Vaping status: Never Used  Substance and Sexual Activity   Alcohol use: Yes    Comment: rarely   Drug use: No   Sexual activity: Not Currently    Birth control/protection: Post-menopausal  Other Topics Concern   Not on file  Social History Narrative   Lives at [facility]   Right handed   Caffeine: rarely   Social Drivers of Corporate investment banker Strain: Not on file  Food Insecurity: No Food Insecurity (08/21/2023)   Hunger Vital Sign    Worried About Running Out of Food in the Last Year:  Never true    Ran Out of Food in the Last Year: Never true  Transportation Needs: No Transportation Needs (08/21/2023)   PRAPARE - Administrator, Civil Service (Medical): No    Lack of Transportation (Non-Medical): No  Physical Activity: Not on file  Stress: Not on file  Social Connections: Unknown (04/20/2022)   Received from [facility], [facility]   Social Network    Social Network: Not on file  Intimate Partner Violence: Not At Risk (08/21/2023)   Humiliation, Afraid, Rape, and Kick questionnaire    Fear  of Current or Ex-Partner: No    Emotionally Abused: No    Physically Abused: No    Sexually Abused: No    Family History  Problem Relation Age of Onset   Congestive Heart Failure Mother    CVA Father    Heart failure Father    Non-Hodgkin's lymphoma Sister    Alcohol abuse Sister    Liver cancer Brother    Colon cancer Brother 20   Colitis Brother    Hypertension Brother    Gout Brother    Melanoma Daughter    Parkinson's disease Cousin    Esophageal cancer Neg Hx    Rectal cancer Neg Hx    Stomach cancer Neg Hx    Colon polyps Neg Hx     Past Medical History:  Diagnosis Date   Chronic kidney disease    stage 3    Chronic sphenoidal sinusitis    Degenerative arthritis of hip    requirde surgery 2009   Depression 2010   Diplopia 2011   in left lateral gaze    Diplopia    " my eyes dont track together, i have corrective prisms in my eye glasses"    Diverticulosis 2006   noted in a colonoscopy   Esophageal reflux 2013   Hypertension    IGT (impaired glucose tolerance) 2011   denies hx of diabetes    Insomnia 2009   Medical history non-contributory    Mixed hyperlipidemia 2000   Osteoarthritis    Osteopenia 2005   Other and unspecified hyperlipidemia 2000   Other specified disease of sebaceous glands    Palpitations 2009   secondary to PSVT   Parkinson disease (HCC)    PNA (pneumonia) 2014   Psoriasis 2004   PSVT (paroxysmal  supraventricular tachycardia) (HCC) 2009   "report it has been some time since i had that "    Situational stress    Urinary tract infection    thru wed 05/15/23, completed 3d antibiotics   Venous insufficiency since 2017    Patient Active Problem List   Diagnosis Date Noted   Dehydration 10/17/2023   Nausea and vomiting 10/17/2023   Eustachian tube dysfunction, bilateral 08/22/2023   Encounter for antineoplastic chemotherapy 06/11/2023   Port-A-Cath in place 05/27/2023   Genetic testing 04/29/2023   Pancreatic cancer (HCC) 04/12/2023   Acute hyponatremia 03/28/2023   Allergic rhinitis 03/28/2023   Obstructive jaundice 03/28/2023   Pancreatic mass 03/28/2023   Anxiety 10/08/2021   Depression 10/08/2021   Parkinson disease (HCC) 10/04/2021   OA (osteoarthritis) of hip 08/19/2019   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   Diverticular disease 07/10/2017   Gastroesophageal reflux disease 07/10/2017   Osteopenia 07/10/2017   Insomnia 06/03/2016   Essential hypertension 03/26/2014   Hyperlipidemia 03/01/2014   Other specified postprocedural states 03/01/2014   Exertional dyspnea 02/07/2014   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   Diplopia 12/15/2013   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    Past Surgical History:  Procedure Laterality Date   BILIARY BRUSHING  03/29/2023   Procedure: BILIARY BRUSHING;  Surgeon: Hilarie Fredrickson, MD;  Location: [facility] ENDOSCOPY;  Service: Gastroenterology;;   BILIARY DILATION  08/08/2023   Procedure: BILIARY DILATION;  Surgeon: Lemar Lofty., MD;  Location: [facility] ENDOSCOPY;  Service: Gastroenterology;;   BILIARY STENT PLACEMENT  03/29/2023   Procedure: BILIARY STENT PLACEMENT;  Surgeon: Hilarie Fredrickson, MD;  Location: [facility] ENDOSCOPY;  Service: Gastroenterology;;   BILIARY STENT PLACEMENT N/A 08/08/2023   Procedure: BILIARY STENT  PLACEMENT;  Surgeon: Meridee Score Netty Starring., MD;  Location: [facility] ENDOSCOPY;  Service: Gastroenterology;  Laterality:  N/A;   BIOPSY  04/08/2023   Procedure: BIOPSY;  Surgeon: Meridee Score Netty Starring., MD;  Location: [facility] ENDOSCOPY;  Service: Gastroenterology;;   CATARACT EXTRACTION W/ INTRAOCULAR LENS  IMPLANT, BILATERAL     COLONOSCOPY  01/30/2010   Jarold Motto   ENDOSCOPIC RETROGRADE CHOLANGIOPANCREATOGRAPHY (ERCP) WITH PROPOFOL N/A 08/08/2023   Procedure: ENDOSCOPIC RETROGRADE CHOLANGIOPANCREATOGRAPHY (ERCP) WITH PROPOFOL;  Surgeon: Lemar Lofty., MD;  Location: [facility] ENDOSCOPY;  Service: Gastroenterology;  Laterality: N/A;   ERCP N/A 03/29/2023   Procedure: ENDOSCOPIC RETROGRADE CHOLANGIOPANCREATOGRAPHY (ERCP);  Surgeon: Hilarie Fredrickson, MD;  Location: [facility] ENDOSCOPY;  Service: Gastroenterology;  Laterality: N/A;   ESOPHAGOGASTRODUODENOSCOPY N/A 08/08/2023   Procedure: ESOPHAGOGASTRODUODENOSCOPY (EGD);  Surgeon: Lemar Lofty., MD;  Location: [facility] ENDOSCOPY;  Service: Gastroenterology;  Laterality: N/A;   ESOPHAGOGASTRODUODENOSCOPY (EGD) WITH PROPOFOL N/A 04/08/2023   Procedure: ESOPHAGOGASTRODUODENOSCOPY (EGD) WITH PROPOFOL;  Surgeon: Meridee Score Netty Starring., MD;  Location: [facility] ENDOSCOPY;  Service: Gastroenterology;  Laterality: N/A;   ETHMOIDECTOMY Left 11/17/2020   Procedure: LEFT SIDED TOTAL ETHMOIDECTOMY;  Surgeon: Drema Halon, MD;  Location: [facility];  Service: ENT;  Laterality: Left;   EUS N/A 04/08/2023   Procedure: UPPER ENDOSCOPIC ULTRASOUND (EUS) LINEAR;  Surgeon: Lemar Lofty., MD;  Location: [facility] ENDOSCOPY;  Service: Gastroenterology;  Laterality: N/A;   EUS N/A 08/08/2023   Procedure: UPPER ENDOSCOPIC ULTRASOUND (EUS) LINEAR;  Surgeon: Lemar Lofty., MD;  Location: [facility] ENDOSCOPY;  Service: Gastroenterology;  Laterality: N/A;   EYE SURGERY     FIDUCIAL MARKER PLACEMENT  08/08/2023   Procedure: FIDUCIAL MARKER PLACEMENT;  Surgeon: Lemar Lofty., MD;  Location: [facility];  Service: Gastroenterology;;   FINE NEEDLE ASPIRATION N/A 04/08/2023    Procedure: FINE NEEDLE ASPIRATION (FNA) LINEAR;  Surgeon: Lemar Lofty., MD;  Location: [facility];  Service: Gastroenterology;  Laterality: N/A;   HEMORRHOID SURGERY     IR IMAGING GUIDED PORT INSERTION  05/17/2023   JOINT REPLACEMENT     LASIK     left medial and superior rectus muscle recesion for diplopia correction     REMOVAL OF STONES  08/08/2023   Procedure: REMOVAL OF SLUDGE;  Surgeon: Meridee Score Netty Starring., MD;  Location: [facility];  Service: Gastroenterology;;   SINUS ENDO WITH FUSION Left 11/17/2020   Procedure: SINUS ENDOSCOPY WITH FUSION NAVIGATION;  Surgeon: Drema Halon, MD;  Location: [facility];  Service: ENT;  Laterality: Left;   SPHENOIDECTOMY Left 11/17/2020   Procedure: LEFT SIDED SPHENOIDECTOMY WITH TISSUE REMOVAL;  Surgeon: Drema Halon, MD;  Location: [facility];  Service: ENT;  Laterality: Left;   SPHINCTEROTOMY  03/29/2023   Procedure: SPHINCTEROTOMY;  Surgeon: Hilarie Fredrickson, MD;  Location: [facility] ENDOSCOPY;  Service: Gastroenterology;;   Francine Graven REMOVAL  08/08/2023   Procedure: STENT REMOVAL;  Surgeon: Lemar Lofty., MD;  Location: [facility] ENDOSCOPY;  Service: Gastroenterology;;   surgery of right eye rectus muscles for diplpia correction   2019   TOTAL HIP ARTHROPLASTY Right 2010   TOTAL HIP ARTHROPLASTY Left 08/19/2019   Procedure: TOTAL HIP ARTHROPLASTY ANTERIOR APPROACH;  Surgeon: Ollen Gross, MD;  Location: [facility] ORS;  Service: Orthopedics;  Laterality: Left;    TURBINATE REDUCTION Left 11/17/2020   Procedure: LEFT SIDED TURBINATE REDUCTION;  Surgeon: Drema Halon, MD;  Location: [facility];  Service: ENT;  Laterality: Left;   URETHRAL SLING  midurethral sling with TVT Exact and Cystoscopy    Current Outpatient Medications  Medication Sig Dispense Refill   ALPRAZolam (XANAX) 0.5 MG tablet Take 0.25 mg by mouth as needed for anxiety. Takes as needed for sleep      AUVELITY 45-105 MG TBCR Take 1 tablet by mouth every evening.     buPROPion (WELLBUTRIN XL) 150 MG 24 hr tablet Take 150 mg by mouth daily.     Calcium Carbonate Antacid (CALCIUM CARBONATE PO) Take by mouth.     carbidopa-levodopa (SINEMET CR) 50-200 MG tablet Take 1 tablet by mouth at bedtime. 90 tablet 3   carbidopa-levodopa (SINEMET IR) 25-100 MG tablet Take 1.5 tablets by mouth 4 (four) times daily. -Try to separate Sinemet from food (especially protein-rich foods like meat, dairy, eggs) by about 30-60 mins - this will help the absorption of the medication. If you have some nausea with the medication, you can take it with some light food like crackers or ginger ale. Try taking at 7am, 10am, 1pm and 4pm and then at bedtime take the extended release(Sinemet CR) 540 tablet 4   donepezil (ARICEPT) 10 MG tablet Take 1 tablet (10 mg total) by mouth at bedtime. 90 tablet 1   dronabinol (MARINOL) 2.5 MG capsule Take 1 capsule (2.5 mg total) by mouth 2 (two) times daily before lunch and supper. 60 capsule 0   [Paused] lipase/protease/amylase (CREON) 36000 UNITS CPEP capsule Take 2 capsules (72,000 Units total) by mouth 3 (three) times daily with meals. May also take 1 capsule (36,000 Units total) as needed (with snacks - up to 4 snacks daily). 180 capsule 0   magic mouthwash (multi-ingredient) oral suspension Take 5 mLs by mouth 4 (four) times daily. 240 mL 1   Multiple Vitamin (MULTIVITAMIN WITH MINERALS) TABS tablet Take 1 tablet by mouth daily. Women's Multivitamin     OVER THE COUNTER MEDICATION Take 1 tablet by mouth daily as needed (constipation).     Polyethyl Glycol-Propyl Glycol (SYSTANE) 0.4-0.3 % SOLN Place 1 drop into both eyes 3 (three) times daily as needed (dry/irritated eyes.).     polyethylene glycol (MIRALAX / GLYCOLAX) 17 g packet Take 17 g by mouth daily.     promethazine (PHENERGAN) 25 MG tablet Take 25 mg by mouth every 6 (six) hours as needed for nausea or vomiting.     triamcinolone  (NASACORT) 55 MCG/ACT AERO nasal inhaler Place 2 sprays into the nose daily. Use at night (Patient taking differently: Place 1 spray into the nose daily. Use at night) 1 each 12   losartan (COZAAR) 50 MG tablet Take 1 tablet (50 mg total) by mouth daily. 90 tablet 1   No current facility-administered medications for this visit.    Allergies as of 02/12/2024 - Review Complete 02/12/2024  Allergen Reaction Noted   Erythromycin Shortness Of Breath and Rash    Macrobid [nitrofurantoin] Other (See Comments) 12/23/2017   Keflex [cephalexin] Nausea And Vomiting 03/27/2023   Levofloxacin Nausea Only 10/04/2021   Penicillins Rash    Sulfamethoxazole-trimethoprim Rash 10/19/2020    Vitals: BP 126/73 (BP Location: Right Arm, Patient Position: Sitting, Cuff Size: Small)   Pulse 74   Ht 5\' 6"  (1.676 m)   Wt 126 lb (57.2 kg)   BMI 20.34 kg/m  Last Weight:  Wt Readings from Last 1 Encounters:  02/12/24 126 lb (57.2 kg)   Last Height:   Ht Readings from Last 1 Encounters:  02/12/24 5\' 6"  (1.676 m)    Exam: Stable  examination NAD, pleasant                  Speech:    Speech is normal; fluent and spontaneous with normal comprehension.  Cognition:    The patient is oriented to person, place, and time;     recent and remote memory intact;     language fluent;    Cranial Nerves: hypomimia    The pupils are equal, round, and reactive to light.Trigeminal sensation is intact and the muscles of mastication are normal. The face is symmetric. The palate elevates in the midline. Hearing intact. Voice is normal. Shoulder shrug is normal. The tongue has normal motion without fasciculations.   Coordination:  No dysmetria  Motor Observation: Minimal tremor postural and end pointing. No resting tremor.  Tone: No significant cogwheeling    Strength:    Strength is V/V in the upper and lower limbs.      Sensation: intact to LT  Gait: no significant shuffling   :Assessment/Plan:   Marie Ford 78  y.o. female here as requested by [name], MD for follow up of Parkinson's Disease. Recent diagnosis of pancreatic cancer on chemo, declining physically. 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  -sinemet to 1.5 4x a day has helped. But she is shaky in the morning and overnight. Added Sinemet CR at bedtime. This may need to be adjusted as she feels its I wearing off even though she takes it every 3 hours and at this time I would defer to Dr. Joselyn Arrow at this time. I had her establish with Dr. Joselyn Arrow as a movement disorder specialist. she had seen Dr. Evern Bio in the past and at this time as she is progressing it would be best to start seeing Dr. Evern Bio regularly and exclusively as she is a wonderful physician and an expert in managing Parkinson's Disease which patient needs at this time. There are other medications other than Sinemet and possible other therapies for PD and/or procedures that [facility] as a Moevment Disorder group would have extensive knowledge. Before adding a new medication in an elderly woman or changing her Sinemet would like her to have the best care. - She is having a lot of nausea, decreased appetite, just completed treatment for pancreatic cancer. Although Parkinsons can cause small fiber autnomic symptoms like nausea but at this time I think it is likely from the treatments and cancer will follow up in 4-5 months. -  worsening cognition, decision making, word finding, forgetfullness, fatigue is  worsening. Aphasia. Recent diagnosis of pancreatic cancer she canceled her appointment with Dr. [name] at this time will wait to retest - On aricept  - Has a psychiatrist Tamela Oddi and a talk therapist encouraged to see them, she is going to see them this month for anxiety she is well connected to her emotions.  - At prior appointments, We reviewed the [facility] movement disorder notes in depth and the appointment at [facility] together with her good friend last appointment. another good friend is with her again today, she recently married and they are very thrilled about that. - At prior appointment, reviewed resources in the community  and I provided her with information on Parkinson's groups, Parkinson's specific exercise groups, she is taking advantage of all of these and is doing extremely well except for her stress as state above, she is working with a Orthoptist at [facility] to see if they can start a support group - went to PT for balance, gait disorder.  .- headaches resolved after sphenoidectomy. - Now established with Dr. Evern Bio at [facility] but will follow with me until/if needed to see [facility] again.   No orders of the defined types were placed in this encounter.  Orders Placed This Encounter  Procedures   Ambulatory referral to Neurology     PRIOR A/P: - Initial appointment details: Patient is parkinsonian on exam(decreased arm swing, narrow gait with low clearance, left-sided weakness and decreased coordination, increased tone left arm with facilitation, slightly masked facies). She has a postural and action tremor. 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 We discussed motor and non motor symptoms, disease progression, treatments (dopamine), was seen at [facility] and will continue to follow with me  - Continue Sinemet very low dose, discussed side effects  - Important to keep active, activity may slow down progression,   - She moved to an independent  living facility, lots of changes ongoing in her life  - make sure she has POA and HCPOA  - Gait abnormality : MRI cervical spine: MRI of the cervical spine shows some slight arthritic changes but the spinal cord is normal. I don;t see any problems in the cervical spine to be causing your symptoms. . (MRI brain normal) did not show any etiology for gait abnormality. Will refer to PT for balance, gait disorder.    - Cognitive complaints: Blood work completed, formal memory testing was overall positive, will repeat when needed, Speech Therapy for difficulty with expression and reception  - Discussed fall risks, orthostatic symptoms in PD and constipation and other things to watch for. Stay hydrated.   - Recommended speech therapy for her difficulty getting words out  - Recommend common sense with driving and consider formal driving test if needed in the future. She has very good insight and judgement.  - regular skin tests, increased risk of skin cancer in PD patient's  - Fatigue: May consider sleep evaluation, she has very vivid dreams.  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   - Follow up with [name] for her anxiety   Cc: [name], MD,  [name], MD  I spent over 60 minutes of face-to-face and non-face-to-face time with patient on the  1. Parkinson's disease without dyskinesia or fluctuating manifestations (HCC)      diagnosis.  This included previsit chart review, lab review, study review, order entry, electronic health record documentation, patient education on the different diagnostic and therapeutic options, counseling and coordination of care, risks and benefits of management, compliance, or risk factor reduction   Naomie Dean, MD  [facility] [address] [place], [place] [address]  Phone [phone] Fax [phone]  I spent over 30 minutes of face-to-face and non-face-to-face time with patient on  the  1. Parkinson's disease without dyskinesia or fluctuating manifestations (HCC)        diagnosis.  This included previsit chart review, lab review, study review, order entry, electronic health record documentation, patient education on the different diagnostic and therapeutic options, counseling and coordination of care, risks and benefits of management, compliance, or risk factor reduction

## 2024-02-14 ENCOUNTER — Other Ambulatory Visit: Payer: Self-pay

## 2024-02-14 DIAGNOSIS — C250 Malignant neoplasm of head of pancreas: Secondary | ICD-10-CM

## 2024-02-17 ENCOUNTER — Telehealth: Payer: Self-pay | Admitting: Neurology

## 2024-02-17 ENCOUNTER — Inpatient Hospital Stay: Payer: Medicare PPO | Attending: Physician Assistant | Admitting: Hematology

## 2024-02-17 ENCOUNTER — Inpatient Hospital Stay

## 2024-02-17 ENCOUNTER — Inpatient Hospital Stay: Payer: Medicare PPO | Attending: Physician Assistant

## 2024-02-17 VITALS — BP 118/72 | HR 72 | Temp 98.2°F | Resp 17 | Wt 129.4 lb

## 2024-02-17 DIAGNOSIS — C250 Malignant neoplasm of head of pancreas: Secondary | ICD-10-CM

## 2024-02-17 DIAGNOSIS — G20A1 Parkinson's disease without dyskinesia, without mention of fluctuations: Secondary | ICD-10-CM | POA: Diagnosis not present

## 2024-02-17 DIAGNOSIS — R300 Dysuria: Secondary | ICD-10-CM

## 2024-02-17 DIAGNOSIS — E860 Dehydration: Secondary | ICD-10-CM

## 2024-02-17 DIAGNOSIS — Z79899 Other long term (current) drug therapy: Secondary | ICD-10-CM | POA: Insufficient documentation

## 2024-02-17 DIAGNOSIS — N390 Urinary tract infection, site not specified: Secondary | ICD-10-CM | POA: Diagnosis not present

## 2024-02-17 DIAGNOSIS — N3000 Acute cystitis without hematuria: Secondary | ICD-10-CM | POA: Diagnosis not present

## 2024-02-17 DIAGNOSIS — F419 Anxiety disorder, unspecified: Secondary | ICD-10-CM | POA: Diagnosis not present

## 2024-02-17 DIAGNOSIS — I10 Essential (primary) hypertension: Secondary | ICD-10-CM | POA: Insufficient documentation

## 2024-02-17 DIAGNOSIS — R5382 Chronic fatigue, unspecified: Secondary | ICD-10-CM | POA: Insufficient documentation

## 2024-02-17 DIAGNOSIS — Z95828 Presence of other vascular implants and grafts: Secondary | ICD-10-CM

## 2024-02-17 DIAGNOSIS — R112 Nausea with vomiting, unspecified: Secondary | ICD-10-CM

## 2024-02-17 LAB — CMP (CANCER CENTER ONLY)
ALT: 7 U/L (ref 0–44)
AST: 24 U/L (ref 15–41)
Albumin: 3.7 g/dL (ref 3.5–5.0)
Alkaline Phosphatase: 162 U/L — ABNORMAL HIGH (ref 38–126)
Anion gap: 6 (ref 5–15)
BUN: 20 mg/dL (ref 8–23)
CO2: 27 mmol/L (ref 22–32)
Calcium: 8.6 mg/dL — ABNORMAL LOW (ref 8.9–10.3)
Chloride: 104 mmol/L (ref 98–111)
Creatinine: 0.86 mg/dL (ref 0.44–1.00)
GFR, Estimated: >60 mL/min (ref >60)
Glucose, Bld: 143 mg/dL — ABNORMAL HIGH (ref 70–99)
Potassium: 3.6 mmol/L (ref 3.5–5.1)
Sodium: 137 mmol/L (ref 135–145)
Total Bilirubin: 0.7 mg/dL (ref 0.0–1.2)
Total Protein: 6.7 g/dL (ref 6.5–8.1)

## 2024-02-17 LAB — CBC WITH DIFFERENTIAL (CANCER CENTER ONLY)
Abs Immature Granulocytes: 0.02 10*3/uL (ref 0.00–0.07)
Basophils Absolute: 0 10*3/uL (ref 0.0–0.1)
Basophils Relative: 0 %
Eosinophils Absolute: 0.1 10*3/uL (ref 0.0–0.5)
Eosinophils Relative: 1 %
HCT: 39.1 % (ref 36.0–46.0)
Hemoglobin: 13.3 g/dL (ref 12.0–15.0)
Immature Granulocytes: 0 %
Lymphocytes Relative: 14 %
Lymphs Abs: 1.1 10*3/uL (ref 0.7–4.0)
MCH: 32.6 pg (ref 26.0–34.0)
MCHC: 34 g/dL (ref 30.0–36.0)
MCV: 95.8 fL (ref 80.0–100.0)
Monocytes Absolute: 0.9 10*3/uL (ref 0.1–1.0)
Monocytes Relative: 12 %
Neutro Abs: 5.6 10*3/uL (ref 1.7–7.7)
Neutrophils Relative %: 73 %
Platelet Count: 309 10*3/uL (ref 150–400)
RBC: 4.08 MIL/uL (ref 3.87–5.11)
RDW: 12.3 % (ref 11.5–15.5)
WBC Count: 7.7 10*3/uL (ref 4.0–10.5)
nRBC: 0 % (ref 0.0–0.2)

## 2024-02-17 MED ORDER — HEPARIN SOD (PORK) LOCK FLUSH 100 UNIT/ML IV SOLN
500.0000 [IU] | Freq: Once | INTRAVENOUS | Status: AC | PRN
  Administered 2024-02-17: 500 [IU]

## 2024-02-17 MED ORDER — SODIUM CHLORIDE 0.9% FLUSH
10.0000 mL | INTRAVENOUS | Status: DC | PRN
  Administered 2024-02-17: 10 mL

## 2024-02-17 NOTE — Telephone Encounter (Signed)
Neurology referral faxed to [facility] (fax# [phone], phone# [phone]) 

## 2024-02-17 NOTE — Progress Notes (Signed)
 [facility]   Telephone:(336) [phone] Fax:(336) [phone]   Clinic Follow up Note   Patient Care Team: [name], MD as PCP - General (Family Medicine) Malachy Mood, MD as Consulting Physician (Oncology)  Date of Service:  02/17/2024  CHIEF COMPLAINT: f/u of pancreatic cancer  CURRENT THERAPY:  Observation and supportive care  Oncology History   Pancreatic cancer [facility]) -stage IB (T2, N0, Mx), diagnosed in April 2024 -Presented to the emergency room on 4/17 with jaundice and dark urine -Baseline bilirubin elevated at 8.6 -CT scan showed pancreatic head lesion with involvement of the distal CBD and associated biliary ductal dilation.  No evidence of lymphadenopathy or metastatic disease to the liver -Had ERCP and stent placement on 4/19 -Underwent EUS by Dr. Meridee Score on 04/08/23 showing 30 x 23 mm pancreatic head lesion without any vascular involvement.  -The final pathology showed pancreatic adenocarcinoma -She has met pancreatobiliary surgeon Dr. Donell Beers, and she declined the surgery.  Patient understands that her cancer is not curable without surgery. -I encouraged her to consider chemotherapy for 3 to 6 months, with consolidation radiation -Patient has decided to start chemotherapy gemcitabine and cisplatin every 2 weeks.  She started on 05/27/23, she has received a total of 4 doses of today. -plan to repeat CT on 8/26 -Her tumor marker CA 19.9 has dropped significantly since she started chemo, she is likely responding to treatment. -She underwent biliary stent exchange and fiducial placement by Dr. Meridee Score on August 08, 2023. She completed SBRT on 09/26/2023   Assessment and Plan    Pancreatic Cancer A 78 year old female with pancreatic cancer, not undergoing chemotherapy, is managed clinically. Symptoms, including nausea, are controlled with Marinol and Phenergan. Occasional pain does not require regular analgesics. Appetite is stable with slight weight  loss. Regular bowel movements are maintained with Miralax, indicating adequate pancreatic function. Routine imaging is avoided to prevent anxiety, with scans considered only if new symptoms arise. Tumor markers are monitored, with the last result normal. The decision to avoid further chemotherapy focuses on symptom management and quality of life. - Continue Marinol and Phenergan for nausea. - Monitor weight and appetite. - Avoid routine imaging unless new symptoms arise. - Monitor tumor markers; results pending. - Schedule follow-up in two months.  Chronic Fatigue Experiences chronic fatigue, requiring naps and rest post-activities. Manages energy by pacing and avoiding overexertion. - Encourage activity pacing and rest as needed.  Hypertension Experienced elevated blood pressure during a recent outing, managed with antihypertensives and Xanax. Blood pressure is otherwise well-controlled. - Continue antihypertensive regimen. - Use Xanax as needed for anxiety and stress.  Parkinson's Disease Parkinson's disease managed with carbidopa-levodopa. Experienced tremors during elevated blood pressure episode, resolved with medication. - Continue carbidopa-levodopa as prescribed.  Urinary Tract Infection (UTI) Reports symptoms suggestive of a UTI, including dysuria and increased frequency. No recent test to confirm diagnosis. - Order urinalysis to evaluate for UTI.  Goals of Care On palliative care, not pursuing further chemotherapy. Focus is on symptom management and quality of life. Access to a palliative care nurse practitioner is available if needed. Agreed to call if new symptoms arise and understands the plan to avoid routine scans unless symptoms develop. - Continue palliative care support. - Provide access to palliative care nurse practitioner as needed.  Plan -Continue supportive care and clinical monitoring. Follow-up in two months unless new symptoms arise. Tumor marker results will  be communicated in a few days. 24/7 access to on-call doctor and nurse hotline for urgent  needs is available. - Schedule follow-up appointment in two months. - Communicate tumor marker results when available. - Provide 24/7 access to on-call doctor and nurse hotline for urgent needs.        SUMMARY OF ONCOLOGIC HISTORY: Oncology History Overview Note   Cancer Staging  Pancreatic cancer [facility]) Staging form: Exocrine Pancreas, AJCC 8th Edition - Clinical: Stage IB (cT2, cN0, cM0) - Signed by Heilingoetter, Cassandra L, PA-C on 04/12/2023 Total positive nodes: 0     Pancreatic cancer (HCC)  03/27/2023 Imaging   CT ABDOMEN PELVIS W CONTRAST   IMPRESSION: Low-attenuation mass centered along the pancreatic head with involvement of the distal common duct with the associated biliary duct ductal dilatation and pancreatic atrophy. Changes are worrisome for neoplasm either a pancreatic neoplasm or cholangiocarcinoma. Recommend further evaluation.   No liver metastases. No pathologically enlarged upper abdominal nodes.   Diffuse colonic stool.  Normal appendix.  Few diverticula.   Portions of the pelvis are obscured by the streak artifact from the bilateral hip arthroplasties.   03/29/2023 Imaging   DG ERCP   IMPRESSION: Intraoperative fluoroscopic images show dilated intra or extrahepatic bile ducts with high-grade stenosis at the level of the distal CBD. Biliary tree is decompressed following placement of common bile duct stent.   04/08/2023 Pathology Results   CYTOLOGY - NON PAP  CASE: [id number]  PATIENT: Marie Ford  Non-Gynecological Cytology Report   FINAL MICROSCOPIC DIAGNOSIS:  A. PANCREAS, HEAD, FINE NEEDLE ASPIRATION:  - Malignant cells present (see Comment)      04/08/2023 Procedure   EUS by Dr. Meridee Score   EUS impression  - A mass was identified in the pancreatic head/ genu of the pancreas. Cytology results are pending. However, the endosonographic  appearance is highly suspicious for adenocarcinoma. This was staged T2 N0 Mx by endosonographic criteria. The staging applies if malignancy is confirmed. Fine needle biopsy performed.  - There was dilation in the common bile duct and in the common hepatic duct which measured up to 8 mm.  - One stent was visualized endosonographically within the common bile duct.  - Hyperechoic material consistent with sludge was visualized endosonographically in the gallbladder.  - Two enlarged lymph nodes were visualized in the celiac region ( level 20) and porta hepatis region. Tissue has not been obtained. However, the endosonographic appearance is suggestive of benign inflammatory change based on the visualization endosonographically.   04/12/2023 Initial Diagnosis   Pancreatic cancer (HCC)   04/12/2023 Cancer Staging   Staging form: Exocrine Pancreas, AJCC 8th Edition - Clinical: Stage IB (cT2, cN0, cM0) - Signed by Heilingoetter, Cassandra L, PA-C on 04/12/2023 Total positive nodes: 0   04/12/2023 Tumor Marker   Patient's tumor was tested for the following markers: CA 19.9. Results of the tumor marker test revealed 512.   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  Report date is 05/03/2023.   The CancerNext-Expanded gene panel offered by [facility] and includes sequencing, rearrangement, and RNA analysis for the following 71 genes:  AIP, ALK, APC, ATM, BAP1, BARD1, BMPR1A, BRCA1, BRCA2, BRIP1, CDC73, CDH1, CDK4, CDKN1B, CDKN2A, CHEK2, DICER1, FH, FLCN, KIF1B, LZTR1,MAX, MEN1, MET, MLH1, MSH2, MSH6, MUTYH, NF1, NF2, NTHL1, PALB2, PHOX2B, PMS2, POT1, PRKAR1A, PTCH1, PTEN, RAD51C,RAD51D, RB1, RET, SDHA, SDHAF2, SDHB, SDHC, SDHD, SMAD4, SMARCA4, SMARCB1, SMARCE1, STK11, SUFU, TMEM127, TP53,TSC1, TSC2 and VHL (sequencing and deletion/duplication); AXIN2, CTNNA1, EGFR, EGLN1, HOXB13, KIT, MITF, MSH3, PDGFRA, POLD1 and POLE (sequencing only); EPCAM and GREM1  (  deletion/duplication only).     05/27/2023 - 08/14/2023 Chemotherapy   Patient is on Treatment Plan : PANCREATIC Abraxane D1,8,15 + Gemcitabine D1,8,15 q28d        Discussed the use of AI scribe software for clinical note transcription with the patient, who gave verbal consent to proceed.  History of Present Illness   The patient, a 78 year old with a history of pancreatic cancer, presents for a follow-up visit. The patient reports that her nausea is well-controlled with Marinol and Phenergan, which she takes daily. She also mentions that her appetite might be improving. Despite a slight weight loss, the patient denies significant pain, only occasional discomfort in her abdomen. The patient's daughter notes that the patient had mentioned pain as a sign of cancer recurrence, but the patient dismisses this as a misinterpretation.  The patient also reports chronic fatigue, which she manages by taking frequent rests and limiting her activities. She mentions an episode of high blood pressure and Parkinson's tremors during a recent outing, which was managed with her evening medication of carbidopa levodopa, additional blood pressure medication, and Xanax.  The patient also mentions a possible urinary tract infection (UTI), as she has been experiencing burning during urination for several weeks. She has been managing her bowel movements with Miralax and denies having diarrhea. The patient also takes a half dose of Xanax at bedtime to help with sleep and manage stress.         All other systems were reviewed with the patient and are negative.  MEDICAL HISTORY:  Past Medical History:  Diagnosis Date   Chronic kidney disease    stage 3    Chronic sphenoidal sinusitis    Degenerative arthritis of hip    requirde surgery 2009   Depression 2010   Diplopia 2011   in left lateral gaze    Diplopia    " my eyes dont track together, i have corrective prisms in my eye glasses"    Diverticulosis 2006    noted in a colonoscopy   Esophageal reflux 2013   Hypertension    IGT (impaired glucose tolerance) 2011   denies hx of diabetes    Insomnia 2009   Medical history non-contributory    Mixed hyperlipidemia 2000   Osteoarthritis    Osteopenia 2005   Other and unspecified hyperlipidemia 2000   Other specified disease of sebaceous glands    Palpitations 2009   secondary to PSVT   Parkinson disease (HCC)    PNA (pneumonia) 2014   Psoriasis 2004   PSVT (paroxysmal supraventricular tachycardia) (HCC) 2009   "report it has been some time since i had that "    Situational stress    Urinary tract infection    thru wed 05/15/23, completed 3d antibiotics   Venous insufficiency since 2017    SURGICAL HISTORY: Past Surgical History:  Procedure Laterality Date   BILIARY BRUSHING  03/29/2023   Procedure: BILIARY BRUSHING;  Surgeon: Hilarie Fredrickson, MD;  Location: [facility] ENDOSCOPY;  Service: Gastroenterology;;   BILIARY DILATION  08/08/2023   Procedure: BILIARY DILATION;  Surgeon: Lemar Lofty., MD;  Location: [facility] ENDOSCOPY;  Service: Gastroenterology;;   BILIARY STENT PLACEMENT  03/29/2023   Procedure: BILIARY STENT PLACEMENT;  Surgeon: Hilarie Fredrickson, MD;  Location[facility] ENDOSCOPY;  Service: Gastroenterology;;   BILIARY STENT PLACEMENT N/A 08/08/2023   Procedure: BILIARY STENT PLACEMENT;  Surgeon: Lemar Lofty., MD;  Location: [facility] ENDOSCOPY;  Service: Gastroenterology;  Laterality: N/A;   BIOPSY  04/08/2023  Procedure: BIOPSY;  Surgeon: Lemar Lofty., MD;  Location: [facility] ENDOSCOPY;  Service: Gastroenterology;;   CATARACT EXTRACTION W/ INTRAOCULAR LENS  IMPLANT, BILATERAL     COLONOSCOPY  01/30/2010   Jarold Motto   ENDOSCOPIC RETROGRADE CHOLANGIOPANCREATOGRAPHY (ERCP) WITH PROPOFOL N/A 08/08/2023   Procedure: ENDOSCOPIC RETROGRADE CHOLANGIOPANCREATOGRAPHY (ERCP) WITH PROPOFOL;  Surgeon: Lemar Lofty., MD;  Location: [facility] ENDOSCOPY;  Service: Gastroenterology;  Laterality:  N/A;   ERCP N/A 03/29/2023   Procedure: ENDOSCOPIC RETROGRADE CHOLANGIOPANCREATOGRAPHY (ERCP);  Surgeon: Hilarie Fredrickson, MD;  Location: [facility] ENDOSCOPY;  Service: Gastroenterology;  Laterality: N/A;   ESOPHAGOGASTRODUODENOSCOPY N/A 08/08/2023   Procedure: ESOPHAGOGASTRODUODENOSCOPY (EGD);  Surgeon: Lemar Lofty., MD;  Location: [facility] ENDOSCOPY;  Service: Gastroenterology;  Laterality: N/A;   ESOPHAGOGASTRODUODENOSCOPY (EGD) WITH PROPOFOL N/A 04/08/2023   Procedure: ESOPHAGOGASTRODUODENOSCOPY (EGD) WITH PROPOFOL;  Surgeon: Meridee Score Netty Starring., MD;  Location: [facility] ENDOSCOPY;  Service: Gastroenterology;  Laterality: N/A;   ETHMOIDECTOMY Left 11/17/2020   Procedure: LEFT SIDED TOTAL ETHMOIDECTOMY;  Surgeon: Drema Halon, MD;  Location: [facility];  Service: ENT;  Laterality: Left;   EUS N/A 04/08/2023   Procedure: UPPER ENDOSCOPIC ULTRASOUND (EUS) LINEAR;  Surgeon: Lemar Lofty., MD;  Location: [facility] ENDOSCOPY;  Service: Gastroenterology;  Laterality: N/A;   EUS N/A 08/08/2023   Procedure: UPPER ENDOSCOPIC ULTRASOUND (EUS) LINEAR;  Surgeon: Lemar Lofty., MD;  Location: [facility] ENDOSCOPY;  Service: Gastroenterology;  Laterality: N/A;   EYE SURGERY     FIDUCIAL MARKER PLACEMENT  08/08/2023   Procedure: FIDUCIAL MARKER PLACEMENT;  Surgeon: Lemar Lofty., MD;  Location: [facility] ENDOSCOPY;  Service: Gastroenterology;;   FINE NEEDLE ASPIRATION N/A 04/08/2023   Procedure: FINE NEEDLE ASPIRATION (FNA) LINEAR;  Surgeon: Lemar Lofty., MD;  Location: [facility] ENDOSCOPY;  Service: Gastroenterology;  Laterality: N/A;   HEMORRHOID SURGERY     IR IMAGING GUIDED PORT INSERTION  05/17/2023   JOINT REPLACEMENT     LASIK     left medial and superior rectus muscle recesion for diplopia correction     REMOVAL OF STONES  08/08/2023   Procedure: REMOVAL OF SLUDGE;  Surgeon: Meridee Score Netty Starring., MD;  Location: [facility] ENDOSCOPY;  Service: Gastroenterology;;   SINUS ENDO WITH  FUSION Left 11/17/2020   Procedure: SINUS ENDOSCOPY WITH FUSION NAVIGATION;  Surgeon: Drema Halon, MD;  Location: [facility];  Service: ENT;  Laterality: Left;   SPHENOIDECTOMY Left 11/17/2020   Procedure: LEFT SIDED SPHENOIDECTOMY WITH TISSUE REMOVAL;  Surgeon: Drema Halon, MD;  Location: [facility];  Service: ENT;  Laterality: Left;   SPHINCTEROTOMY  03/29/2023   Procedure: SPHINCTEROTOMY;  Surgeon: Hilarie Fredrickson, MD;  Location: [facility] ENDOSCOPY;  Service: Gastroenterology;;   Francine Graven REMOVAL  08/08/2023   Procedure: STENT REMOVAL;  Surgeon: Lemar Lofty., MD;  Location: [facility] ENDOSCOPY;  Service: Gastroenterology;;   surgery of right eye rectus muscles for diplpia correction   2019   TOTAL HIP ARTHROPLASTY Right 2010   TOTAL HIP ARTHROPLASTY Left 08/19/2019   Procedure: TOTAL HIP ARTHROPLASTY ANTERIOR APPROACH;  Surgeon: Ollen Gross, MD;  Location: [facility]S;  Service: Orthopedics;  Laterality: Left;    TURBINATE REDUCTION Left 11/17/2020   Procedure: LEFT SIDED TURBINATE REDUCTION;  Surgeon: Drema Halon, MD;  Location: [facility];  Service: ENT;  Laterality: Left;   URETHRAL SLING     midurethral sling with TVT Exact and Cystoscopy    I have reviewed the social history and family history with the patient  and they are unchanged from previous note.  ALLERGIES:  is allergic to erythromycin, macrobid [nitrofurantoin], keflex [cephalexin], creon [pancrelipase (lip-prot-amyl)], levofloxacin, penicillins, and sulfamethoxazole-trimethoprim.  MEDICATIONS:  Current Outpatient Medications  Medication Sig Dispense Refill   ALPRAZolam (XANAX) 0.5 MG tablet Take 0.25 mg by mouth as needed for anxiety. Takes as needed for sleep     AUVELITY 45-105 MG TBCR Take 1 tablet by mouth every evening.     buPROPion (WELLBUTRIN XL) 150 MG 24 hr tablet Take 150 mg by mouth daily.     Calcium Carbonate Antacid (CALCIUM CARBONATE PO)  Take by mouth.     carbidopa-levodopa (SINEMET CR) 50-200 MG tablet Take 1 tablet by mouth at bedtime. 90 tablet 3   carbidopa-levodopa (SINEMET IR) 25-100 MG tablet Take 1.5 tablets by mouth 4 (four) times daily. -Try to separate Sinemet from food (especially protein-rich foods like meat, dairy, eggs) by about 30-60 mins - this will help the absorption of the medication. If you have some nausea with the medication, you can take it with some light food like crackers or ginger ale. Try taking at 7am, 10am, 1pm and 4pm and then at bedtime take the extended release(Sinemet CR) 540 tablet 4   donepezil (ARICEPT) 10 MG tablet Take 1 tablet (10 mg total) by mouth at bedtime. 90 tablet 1   dronabinol (MARINOL) 2.5 MG capsule Take 1 capsule (2.5 mg total) by mouth 2 (two) times daily before lunch and supper. 60 capsule 0   losartan (COZAAR) 100 MG tablet Take 100 mg by mouth daily.     magic mouthwash (multi-ingredient) oral suspension Take 5 mLs by mouth 4 (four) times daily. 240 mL 1   Multiple Vitamin (MULTIVITAMIN WITH MINERALS) TABS tablet Take 1 tablet by mouth daily. Women's Multivitamin     OVER THE COUNTER MEDICATION Take 1 tablet by mouth daily as needed (constipation).     Polyethyl Glycol-Propyl Glycol (SYSTANE) 0.4-0.3 % SOLN Place 1 drop into both eyes 3 (three) times daily as needed (dry/irritated eyes.).     polyethylene glycol (MIRALAX / GLYCOLAX) 17 g packet Take 17 g by mouth daily.     promethazine (PHENERGAN) 25 MG tablet Take 25 mg by mouth every 6 (six) hours as needed for nausea or vomiting.     triamcinolone (NASACORT) 55 MCG/ACT AERO nasal inhaler Place 2 sprays into the nose daily. Use at night (Patient taking differently: Place 1 spray into the nose daily. Use at night) 1 each 12   No current facility-administered medications for this visit.    PHYSICAL EXAMINATION: ECOG PERFORMANCE STATUS: 2 - Symptomatic, <50% confined to bed  Vitals:   02/17/24 1349  BP: 118/72  Pulse:  72  Resp: 17  Temp: 98.2 F (36.8 C)  SpO2: 99%   Wt Readings from Last 3 Encounters:  02/17/24 129 lb 6.4 oz (58.7 kg)  02/12/24 126 lb (57.2 kg)  01/09/24 131 lb (59.4 kg)     GENERAL:alert, no distress and comfortable SKIN: skin color, texture, turgor are normal, no rashes or significant lesions EYES: normal, Conjunctiva are pink and non-injected, sclera clear NECK: supple, thyroid normal size, non-tender, without nodularity LYMPH:  no palpable lymphadenopathy in the cervical, axillary  LUNGS: clear to auscultation and percussion with normal breathing effort HEART: regular rate & rhythm and no murmurs and no lower extremity edema ABDOMEN:abdomen soft, non-tender and normal bowel sounds Musculoskeletal:no cyanosis of digits and no clubbing  NEURO: alert & oriented x 3 with fluent speech, no focal  motor/sensory deficits         LABORATORY DATA:  I have reviewed the data as listed    Latest Ref Rng & Units 02/17/2024    1:20 PM 01/09/2024    9:55 AM 01/06/2024    1:38 PM  CBC  WBC 4.0 - 10.5 K/uL 7.7  8.1  6.8   Hemoglobin 12.0 - 15.0 g/dL 40.9  81.1  91.4   Hematocrit 36.0 - 46.0 % 39.1  39.5  36.4   Platelets 150 - 400 K/uL 309  371  330         Latest Ref Rng & Units 02/17/2024    1:20 PM 01/09/2024    9:55 AM 01/06/2024    1:38 PM  CMP  Glucose 70 - 99 mg/dL 782  94  956   BUN 8 - 23 mg/dL 20  21  22    Creatinine 0.44 - 1.00 mg/dL 2.13  0.86  5.78   Sodium 135 - 145 mmol/L 137  141  138   Potassium 3.5 - 5.1 mmol/L 3.6  3.3  3.6   Chloride 98 - 111 mmol/L 104  106  105   CO2 22 - 32 mmol/L 27  25  28    Calcium 8.9 - 10.3 mg/dL 8.6  9.0  9.0   Total Protein 6.5 - 8.1 g/dL 6.7   6.7   Total Bilirubin 0.0 - 1.2 mg/dL 0.7   0.9   Alkaline Phos 38 - 126 U/L 162   97   AST 15 - 41 U/L 24   21   ALT 0 - 44 U/L 7   <5       RADIOGRAPHIC STUDIES: I have personally reviewed the radiological images as listed and agreed with the findings in the report. No results  found.    Orders Placed This Encounter  Procedures   Urine Culture    Standing Status:   Future    Expiration Date:   02/16/2025   Urinalysis, Complete w Microscopic    Standing Status:   Future    Expiration Date:   02/16/2025   All questions were answered. The patient knows to call the clinic with any problems, questions or concerns. No barriers to learning was detected. The total time spent in the appointment was 25 minutes.     Malachy Mood, MD 02/17/2024

## 2024-02-17 NOTE — Assessment & Plan Note (Signed)
-  stage IB (T2, N0, Mx), diagnosed in April 2024 -Presented to the emergency room on 4/17 with jaundice and dark urine -Baseline bilirubin elevated at 8.6 -CT scan showed pancreatic head lesion with involvement of the distal CBD and associated biliary ductal dilation.  No evidence of lymphadenopathy or metastatic disease to the liver -Had ERCP and stent placement on 4/19 -Underwent EUS by Dr. Meridee Score on 04/08/23 showing 30 x 23 mm pancreatic head lesion without any vascular involvement.  -The final pathology showed pancreatic adenocarcinoma -She has met pancreatobiliary surgeon Dr. Donell Beers, and she declined the surgery.  Patient understands that her cancer is not curable without surgery. -I encouraged her to consider chemotherapy for 3 to 6 months, with consolidation radiation -Patient has decided to start chemotherapy gemcitabine and cisplatin every 2 weeks.  She started on 05/27/23, she has received a total of 4 doses of today. -plan to repeat CT on 8/26 -Her tumor marker CA 19.9 has dropped significantly since she started chemo, she is likely responding to treatment. -She underwent biliary stent exchange and fiducial placement by Dr. Meridee Score on August 08, 2023. She completed SBRT on 09/26/2023

## 2024-02-18 DIAGNOSIS — C250 Malignant neoplasm of head of pancreas: Secondary | ICD-10-CM | POA: Diagnosis not present

## 2024-02-18 LAB — URINALYSIS, COMPLETE (UACMP) WITH MICROSCOPIC
Bacteria, UA: NONE SEEN
Bilirubin Urine: NEGATIVE
Glucose, UA: NEGATIVE mg/dL
Hgb urine dipstick: NEGATIVE
Ketones, ur: 5 mg/dL — AB
Leukocytes,Ua: NEGATIVE
Nitrite: NEGATIVE
Protein, ur: NEGATIVE mg/dL
Specific Gravity, Urine: 1.011 (ref 1.005–1.030)
pH: 5 (ref 5.0–8.0)

## 2024-02-18 LAB — CANCER ANTIGEN 19-9: CA 19-9: 122 U/mL — ABNORMAL HIGH (ref 0–35)

## 2024-02-19 ENCOUNTER — Encounter: Payer: Self-pay | Admitting: Hematology

## 2024-02-19 LAB — URINE CULTURE: Culture: NO GROWTH

## 2024-02-22 ENCOUNTER — Encounter: Payer: Self-pay | Admitting: Nurse Practitioner

## 2024-02-25 ENCOUNTER — Other Ambulatory Visit: Payer: Self-pay | Admitting: Nurse Practitioner

## 2024-02-27 ENCOUNTER — Telehealth: Payer: Self-pay

## 2024-02-27 NOTE — Telephone Encounter (Signed)
 Received a VM from [place] at [facility] stating that the prescription for Dronabinol (Marinol) 2.5mg  Caps prescribed by Vincent Gros, NP is requiring a PA.  Lyla Son stated that the out of pocket cost for a 30-day supply is $150.  Lyla Son is requesting if a PA could be done for the Marinol.  Notified Dr. [name], Vincent Gros, NP, and PA Team of the pharmacist's call.

## 2024-02-28 ENCOUNTER — Telehealth: Payer: Self-pay

## 2024-02-28 ENCOUNTER — Encounter: Payer: Self-pay | Admitting: Nurse Practitioner

## 2024-02-28 NOTE — Telephone Encounter (Signed)
 Notified patient that prior authorization is not needed for Dronabinol 2.5mg  Capsules. Medication is on list of covered drugs. No other needs or concerns noted at this time.

## 2024-02-28 NOTE — Telephone Encounter (Signed)
 Spoke with patient via telephone call, per Dr. [name]. Let patient know her urine culture is negative, no UTI.  Patient verbalized understanding and did not have any further questions or concerns.

## 2024-03-18 ENCOUNTER — Other Ambulatory Visit: Payer: Self-pay

## 2024-03-18 ENCOUNTER — Encounter: Payer: Self-pay | Admitting: Hematology

## 2024-03-20 ENCOUNTER — Ambulatory Visit (HOSPITAL_COMMUNITY)

## 2024-03-20 ENCOUNTER — Encounter (HOSPITAL_COMMUNITY): Payer: Self-pay

## 2024-04-13 ENCOUNTER — Other Ambulatory Visit: Payer: Self-pay

## 2024-04-13 DIAGNOSIS — C250 Malignant neoplasm of head of pancreas: Secondary | ICD-10-CM

## 2024-04-13 NOTE — Assessment & Plan Note (Signed)
-  stage IB (T2, N0, Mx), diagnosed in April 2024 -Presented to the emergency room on 4/17 with jaundice and dark urine -Baseline bilirubin elevated at 8.6 -CT scan showed pancreatic head lesion with involvement of the distal CBD and associated biliary ductal dilation.  No evidence of lymphadenopathy or metastatic disease to the liver -Had ERCP and stent placement on 4/19 -Underwent EUS by Dr. Brice Campi on 04/08/23 showing 30 x 23 mm pancreatic head lesion without any vascular involvement.  -The final pathology showed pancreatic adenocarcinoma -She has met pancreatobiliary surgeon Dr. Cherlynn Cornfield, and she declined the surgery.  Patient understands that her cancer is not curable without surgery. -I encouraged her to consider chemotherapy for 3 to 6 months, with consolidation radiation -Patient has decided to start chemotherapy gemcitabine  and cisplatin every 2 weeks.  She started on 05/27/23, she has received a total of 4 doses of today. -plan to repeat CT on 8/26 -Her tumor marker CA 19.9 has dropped significantly since she started chemo, she is likely responding to treatment. -She underwent biliary stent exchange and fiducial placement by Dr. Brice Campi on August 08, 2023. She completed SBRT on 09/26/2023 -CT 11/30/2024 showed partial response to treatment, no metastatic disease

## 2024-04-14 ENCOUNTER — Inpatient Hospital Stay: Admitting: Hematology

## 2024-04-14 ENCOUNTER — Encounter: Payer: Self-pay | Admitting: Hematology

## 2024-04-14 ENCOUNTER — Inpatient Hospital Stay: Attending: Physician Assistant

## 2024-04-14 VITALS — BP 110/60 | HR 53 | Temp 97.5°F | Resp 21 | Ht 66.0 in | Wt 127.5 lb

## 2024-04-14 DIAGNOSIS — C250 Malignant neoplasm of head of pancreas: Secondary | ICD-10-CM | POA: Diagnosis not present

## 2024-04-14 DIAGNOSIS — R630 Anorexia: Secondary | ICD-10-CM | POA: Diagnosis not present

## 2024-04-14 DIAGNOSIS — Z95828 Presence of other vascular implants and grafts: Secondary | ICD-10-CM

## 2024-04-14 DIAGNOSIS — D649 Anemia, unspecified: Secondary | ICD-10-CM | POA: Diagnosis not present

## 2024-04-14 DIAGNOSIS — R197 Diarrhea, unspecified: Secondary | ICD-10-CM | POA: Diagnosis not present

## 2024-04-14 DIAGNOSIS — R1011 Right upper quadrant pain: Secondary | ICD-10-CM | POA: Insufficient documentation

## 2024-04-14 DIAGNOSIS — R110 Nausea: Secondary | ICD-10-CM | POA: Diagnosis not present

## 2024-04-14 DIAGNOSIS — K5900 Constipation, unspecified: Secondary | ICD-10-CM | POA: Diagnosis not present

## 2024-04-14 DIAGNOSIS — G20A1 Parkinson's disease without dyskinesia, without mention of fluctuations: Secondary | ICD-10-CM | POA: Insufficient documentation

## 2024-04-14 DIAGNOSIS — R112 Nausea with vomiting, unspecified: Secondary | ICD-10-CM

## 2024-04-14 DIAGNOSIS — E860 Dehydration: Secondary | ICD-10-CM

## 2024-04-14 LAB — CBC WITH DIFFERENTIAL (CANCER CENTER ONLY)
Abs Immature Granulocytes: 0.03 10*3/uL (ref 0.00–0.07)
Basophils Absolute: 0 10*3/uL (ref 0.0–0.1)
Basophils Relative: 0 %
Eosinophils Absolute: 0.1 10*3/uL (ref 0.0–0.5)
Eosinophils Relative: 1 %
HCT: 35.1 % — ABNORMAL LOW (ref 36.0–46.0)
Hemoglobin: 12.2 g/dL (ref 12.0–15.0)
Immature Granulocytes: 0 %
Lymphocytes Relative: 12 %
Lymphs Abs: 1.1 10*3/uL (ref 0.7–4.0)
MCH: 32.2 pg (ref 26.0–34.0)
MCHC: 34.8 g/dL (ref 30.0–36.0)
MCV: 92.6 fL (ref 80.0–100.0)
Monocytes Absolute: 1.2 10*3/uL — ABNORMAL HIGH (ref 0.1–1.0)
Monocytes Relative: 13 %
Neutro Abs: 6.8 10*3/uL (ref 1.7–7.7)
Neutrophils Relative %: 74 %
Platelet Count: 346 10*3/uL (ref 150–400)
RBC: 3.79 MIL/uL — ABNORMAL LOW (ref 3.87–5.11)
RDW: 12.3 % (ref 11.5–15.5)
WBC Count: 9.2 10*3/uL (ref 4.0–10.5)
nRBC: 0 % (ref 0.0–0.2)

## 2024-04-14 LAB — CMP (CANCER CENTER ONLY)
ALT: 7 U/L (ref 0–44)
AST: 32 U/L (ref 15–41)
Albumin: 3.6 g/dL (ref 3.5–5.0)
Alkaline Phosphatase: 195 U/L — ABNORMAL HIGH (ref 38–126)
Anion gap: 7 (ref 5–15)
BUN: 17 mg/dL (ref 8–23)
CO2: 25 mmol/L (ref 22–32)
Calcium: 9 mg/dL (ref 8.9–10.3)
Chloride: 105 mmol/L (ref 98–111)
Creatinine: 1.02 mg/dL — ABNORMAL HIGH (ref 0.44–1.00)
GFR, Estimated: 56 mL/min — ABNORMAL LOW (ref >60)
Glucose, Bld: 122 mg/dL — ABNORMAL HIGH (ref 70–99)
Potassium: 3.8 mmol/L (ref 3.5–5.1)
Sodium: 137 mmol/L (ref 135–145)
Total Bilirubin: 0.7 mg/dL (ref 0.0–1.2)
Total Protein: 7.1 g/dL (ref 6.5–8.1)

## 2024-04-14 MED ORDER — HEPARIN SOD (PORK) LOCK FLUSH 100 UNIT/ML IV SOLN: 500.0000 [IU] | Freq: Once | INTRAVENOUS | Status: DC | PRN

## 2024-04-14 MED ORDER — SODIUM CHLORIDE 0.9% FLUSH
10.0000 mL | Freq: Once | INTRAVENOUS | Status: AC | PRN
  Administered 2024-04-14: 10 mL

## 2024-04-14 NOTE — Progress Notes (Signed)
 [facility]   [phone] Fax:([phone]   Clinic Follow up Note   Patient Care Team: Candise Chambers, MD as PCP - General (Family Medicine) Sonja New Hope, MD as Consulting Physician (Oncology)  Date of Service:  04/14/2024  CHIEF COMPLAINT: f/u of pancreatic cancer  CURRENT THERAPY:  Supportive care  Oncology History   Pancreatic cancer [facility]) -stage IB (T2, N0, Mx), diagnosed in April 2024 -Presented to the emergency room on 4/17 with jaundice and dark urine -Baseline bilirubin elevated at 8.6 -CT scan showed pancreatic head lesion with involvement of the distal CBD and associated biliary ductal dilation.  No evidence of lymphadenopathy or metastatic disease to the liver -Had ERCP and stent placement on 4/19 -Underwent EUS by Dr. Brice Campi on 04/08/23 showing 30 x 23 mm pancreatic head lesion without any vascular involvement.  -The final pathology showed pancreatic adenocarcinoma -She has met pancreatobiliary surgeon Dr. Cherlynn Cornfield, and she declined the surgery.  Patient understands that her cancer is not curable without surgery. -I encouraged her to consider chemotherapy for 3 to 6 months, with consolidation radiation -Patient has decided to start chemotherapy gemcitabine  and cisplatin every 2 weeks.  She started on 05/27/23, she has received a total of 4 doses of today. -plan to repeat CT on 8/26 -Her tumor marker CA 19.9 has dropped significantly since she started chemo, she is likely responding to treatment. -She underwent biliary stent exchange and fiducial placement by Dr. Brice Campi on August 08, 2023. She completed SBRT on 09/26/2023 -CT 11/30/2024 showed partial response to treatment, no metastatic disease   Assessment & Plan Pancreatic cancer Diagnosed a year ago, pancreatic cancer is not curable and is expected to progress. Current symptoms include intermittent right side pain, low appetite, and mild nausea. Elevated tumor marker indicates active  disease. Discussed potential progression to liver and lymph nodes. Current management focuses on symptom control with a poor prognosis and expected progression in 6-12 months, emphasizing quality of life. - Consider celiac nerve block if pain worsens - Continue current medications for symptom control - Contact if pain worsens or new symptoms develop  Intermittent right side pain Pain rated 4-5/10, lasting 3-4 minutes, occurring once daily. Managed with acetaminophen . Discussed potential escalation to tramadol  or hydrocodone  if pain worsens. - Continue acetaminophen  for pain management - Contact if pain worsens for potential prescription of tramadol  or hydrocodone   Nausea Mild nausea managed with dronabinol  and promethazine , possibly related to pancreatic cancer irritating the stomach. - Continue dronabinol  and promethazine  as needed for nausea  Low appetite Low appetite likely related to pancreatic cancer. Dronabinol  is being used to help with appetite. - Continue dronabinol  for appetite stimulation  Gastrointestinal issues Alternating constipation and diarrhea. Recent use of polyethylene glycol led to excessive bowel movements. Advised to adjust dosage and consider fiber supplements or docusate for gentler management. - Adjust polyethylene glycol to half dose if needed - Consider fiber supplements or docusate for bowel regulation  Mild anemia Mild anemia with hemoglobin at 12.2, not contributing to current symptoms of coldness and shivering. No iron deficiency suspected.  Parkinson's disease Managed by neurologist with current medication regimen at maximum dosage. Referral to neurologist in [facility] for further management, but appointment not available until October. - Continue current Parkinson's medication regimen - Follow up with neurologist in [facility] in October  Goals of Care Discussed prognosis and expected disease progression. Emphasized importance of quality of  life and symptom management. She and her family understand the prognosis and are focused  on maintaining quality of life.  Plan - Continue supportive care, she will call me if her right upper quadrant pain gets worse, I will call in tramadol  or hydrocodone  for her.  She will continue Tylenol  as needed for now. - Lab and follow-up in 2 months, sooner if needed.   SUMMARY OF ONCOLOGIC HISTORY: Oncology History Overview Note   Cancer Staging  Pancreatic cancer [facility]) Staging form: Exocrine Pancreas, AJCC 8th Edition - Clinical: Stage IB (cT2, cN0, cM0) - Signed by Heilingoetter, Cassandra L, PA-C on 04/12/2023 Total positive nodes: 0     Pancreatic cancer (HCC)  03/27/2023 Imaging   CT ABDOMEN PELVIS W CONTRAST   IMPRESSION: Low-attenuation mass centered along the pancreatic head with involvement of the distal common duct with the associated biliary duct ductal dilatation and pancreatic atrophy. Changes are worrisome for neoplasm either a pancreatic neoplasm or cholangiocarcinoma. Recommend further evaluation.   No liver metastases. No pathologically enlarged upper abdominal nodes.   Diffuse colonic stool.  Normal appendix.  Few diverticula.   Portions of the pelvis are obscured by the streak artifact from the bilateral hip arthroplasties.   03/29/2023 Imaging   DG ERCP   IMPRESSION: Intraoperative fluoroscopic images show dilated intra or extrahepatic bile ducts with high-grade stenosis at the level of the distal CBD. Biliary tree is decompressed following placement of common bile duct stent.   04/08/2023 Pathology Results   CYTOLOGY - NON PAP  CASE: [id number]  PATIENT: Kassey Cobbins  Non-Gynecological Cytology Report   FINAL MICROSCOPIC DIAGNOSIS:  A. PANCREAS, HEAD, FINE NEEDLE ASPIRATION:  - Malignant cells present (see Comment)      04/08/2023 Procedure   EUS by Dr. Brice Campi   EUS impression  - A mass was identified in the pancreatic head/ genu of the  pancreas. Cytology results are pending. However, the endosonographic appearance is highly suspicious for adenocarcinoma. This was staged T2 N0 Mx by endosonographic criteria. The staging applies if malignancy is confirmed. Fine needle biopsy performed.  - There was dilation in the common bile duct and in the common hepatic duct which measured up to 8 mm.  - One stent was visualized endosonographically within the common bile duct.  - Hyperechoic material consistent with sludge was visualized endosonographically in the gallbladder.  - Two enlarged lymph nodes were visualized in the celiac region ( level 20) and porta hepatis region. Tissue has not been obtained. However, the endosonographic appearance is suggestive of benign inflammatory change based on the visualization endosonographically.   04/12/2023 Initial Diagnosis   Pancreatic cancer (HCC)   04/12/2023 Cancer Staging   Staging form: Exocrine Pancreas, AJCC 8th Edition - Clinical: Stage IB (cT2, cN0, cM0) - Signed by Heilingoetter, Cassandra L, PA-C on 04/12/2023 Total positive nodes: 0   04/12/2023 Tumor Marker   Patient's tumor was tested for the following markers: CA 19.9. Results of the tumor marker test revealed 512.   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  Report date is 05/03/2023.   The CancerNext-Expanded gene panel offered by [facility] and includes sequencing, rearrangement, and RNA analysis for the following 71 genes:  AIP, ALK, APC, ATM, BAP1, BARD1, BMPR1A, BRCA1, BRCA2, BRIP1, CDC73, CDH1, CDK4, CDKN1B, CDKN2A, CHEK2, DICER1, FH, FLCN, KIF1B, LZTR1,MAX, MEN1, MET, MLH1, MSH2, MSH6, MUTYH, NF1, NF2, NTHL1, PALB2, PHOX2B, PMS2, POT1, PRKAR1A, PTCH1, PTEN, RAD51C,RAD51D, RB1, RET, SDHA, SDHAF2, SDHB, SDHC, SDHD, SMAD4, SMARCA4, SMARCB1, SMARCE1, STK11, SUFU, TMEM127, TP53,TSC1, TSC2 and VHL (sequencing and deletion/duplication); AXIN2, CTNNA1,  EGFR, EGLN1, HOXB13, KIT, MITF, MSH3,  PDGFRA, POLD1 and POLE (sequencing only); EPCAM and GREM1 (deletion/duplication only).     05/27/2023 - 08/14/2023 Chemotherapy   Patient is on Treatment Plan : PANCREATIC Abraxane  D1,8,15 + Gemcitabine  D1,8,15 q28d        Discussed the use of AI scribe software for clinical note transcription with the patient, who gave verbal consent to proceed.  History of Present Illness PRUDENCE FOGELSON is a 78 year old female with pancreatic cancer who presents for follow-up. She is accompanied by her daughter.  She experiences intermittent right-sided abdominal pain, described as cramping, occurring once daily and lasting three to four minutes. The pain is rated as four to five out of ten and occurs during naps or in the morning. She manages the pain with Tylenol  and has not used stronger pain medications.  Her appetite is very low, and she maintains her weight at 127 pounds. She takes InfaSure daily, sometimes twice a day, and believes she is adequately hydrated. Marinol  is taken twice daily for appetite and nausea, and Phenergan  is taken daily for nausea. She inquires about supplements due to feeling cold and shivery.  She experiences alternating constipation and loose stools. Miralax  is used for constipation, and Imodium is used for loose stools. Recently, Miralax  led to six bowel movements the following day.     All other systems were reviewed with the patient and are negative.  MEDICAL HISTORY:  Past Medical History:  Diagnosis Date   Chronic kidney disease    stage 3    Chronic sphenoidal sinusitis    Degenerative arthritis of hip    requirde surgery 2009   Depression 2010   Diplopia 2011   in left lateral gaze    Diplopia    " my eyes dont track together, i have corrective prisms in my eye glasses"    Diverticulosis 2006   noted in a colonoscopy   Esophageal reflux 2013   Hypertension    IGT (impaired glucose tolerance) 2011   denies hx of diabetes    Insomnia 2009   Medical history  non-contributory    Mixed hyperlipidemia 2000   Osteoarthritis    Osteopenia 2005   Other and unspecified hyperlipidemia 2000   Other specified disease of sebaceous glands    Palpitations 2009   secondary to PSVT   Parkinson disease (HCC)    PNA (pneumonia) 2014   Psoriasis 2004   PSVT (paroxysmal supraventricular tachycardia) (HCC) 2009   "report it has been some time since i had that "    Situational stress    Urinary tract infection    thru wed 05/15/23, completed 3d antibiotics   Venous insufficiency since 2017    SURGICAL HISTORY: Past Surgical History:  Procedure Laterality Date   BILIARY BRUSHING  03/29/2023   Procedure: BILIARY BRUSHING;  Surgeon: Tobin Forts, MD;  Location: [facility] ENDOSCOPY;  Service: Gastroenterology;;   BILIARY DILATION  08/08/2023   Procedure: BILIARY DILATION;  Surgeon: Normie Becton., MD;  Location: [facility] ENDOSCOPY;  Service: Gastroenterology;;   BILIARY STENT PLACEMENT  03/29/2023   Procedure: BILIARY STENT PLACEMENT;  Surgeon: Tobin Forts, MD;  Location: [facility] ENDOSCOPY;  Service: Gastroenterology;;   BILIARY STENT PLACEMENT N/A 08/08/2023   Procedure: BILIARY STENT PLACEMENT;  Surgeon: Normie Becton., MD;  Location: [facility] ENDOSCOPY;  Service: Gastroenterology;  Laterality: N/A;   BIOPSY  04/08/2023   Procedure: BIOPSY;  Surgeon: Brice Campi Albino Alu., MD;  Location: [facility] ENDOSCOPY;  Service:  Gastroenterology;;   CATARACT EXTRACTION W/ INTRAOCULAR LENS  IMPLANT, BILATERAL     COLONOSCOPY  01/30/2010   Adan Holms   ENDOSCOPIC RETROGRADE CHOLANGIOPANCREATOGRAPHY (ERCP) WITH PROPOFOL  N/A 08/08/2023   Procedure: ENDOSCOPIC RETROGRADE CHOLANGIOPANCREATOGRAPHY (ERCP) WITH PROPOFOL ;  Surgeon: Normie Becton., MD;  Location: [facility];  Service: Gastroenterology;  Laterality: N/A;   ERCP N/A 03/29/2023   Procedure: ENDOSCOPIC RETROGRADE CHOLANGIOPANCREATOGRAPHY (ERCP);  Surgeon: Tobin Forts, MD;  Location: [facility] ENDOSCOPY;  Service:  Gastroenterology;  Laterality: N/A;   ESOPHAGOGASTRODUODENOSCOPY N/A 08/08/2023   Procedure: ESOPHAGOGASTRODUODENOSCOPY (EGD);  Surgeon: Normie Becton., MD;  Location: [facility];  Service: Gastroenterology;  Laterality: N/A;   ESOPHAGOGASTRODUODENOSCOPY (EGD) WITH PROPOFOL  N/A 04/08/2023   Procedure: ESOPHAGOGASTRODUODENOSCOPY (EGD) WITH PROPOFOL ;  Surgeon: Brice Campi Albino Alu., MD;  Location: [facility] ENDOSCOPY;  Service: Gastroenterology;  Laterality: N/A;   ETHMOIDECTOMY Left 11/17/2020   Procedure: LEFT SIDED TOTAL ETHMOIDECTOMY;  Surgeon: Prescott Brodie, MD;  Location: [facility];  Service: ENT;  Laterality: Left;   EUS N/A 04/08/2023   Procedure: UPPER ENDOSCOPIC ULTRASOUND (EUS) LINEAR;  Surgeon: Normie Becton., MD;  Location: [facility] ENDOSCOPY;  Service: Gastroenterology;  Laterality: N/A;   EUS N/A 08/08/2023   Procedure: UPPER ENDOSCOPIC ULTRASOUND (EUS) LINEAR;  Surgeon: Normie Becton., MD;  Location: [facility] ENDOSCOPY;  Service: Gastroenterology;  Laterality: N/A;   EYE SURGERY     FIDUCIAL MARKER PLACEMENT  08/08/2023   Procedure: FIDUCIAL MARKER PLACEMENT;  Surgeon: Normie Becton., MD;  Location: [facility];  Service: Gastroenterology;;   FINE NEEDLE ASPIRATION N/A 04/08/2023   Procedure: FINE NEEDLE ASPIRATION (FNA) LINEAR;  Surgeon: Normie Becton., MD;  Location: [facility];  Service: Gastroenterology;  Laterality: N/A;   HEMORRHOID SURGERY     IR IMAGING GUIDED PORT INSERTION  05/17/2023   JOINT REPLACEMENT     LASIK     left medial and superior rectus muscle recesion for diplopia correction     REMOVAL OF STONES  08/08/2023   Procedure: REMOVAL OF SLUDGE;  Surgeon: Brice Campi Albino Alu., MD;  Location: [facility];  Service: Gastroenterology;;   SINUS ENDO WITH FUSION Left 11/17/2020   Procedure: SINUS ENDOSCOPY WITH FUSION NAVIGATION;  Surgeon: Prescott Brodie, MD;  Location: [facility];  Service:  ENT;  Laterality: Left;   SPHENOIDECTOMY Left 11/17/2020   Procedure: LEFT SIDED SPHENOIDECTOMY WITH TISSUE REMOVAL;  Surgeon: Prescott Brodie, MD;  Location: [facility];  Service: ENT;  Laterality: Left;   SPHINCTEROTOMY  03/29/2023   Procedure: SPHINCTEROTOMY;  Surgeon: Tobin Forts, MD;  Location: [facility] ENDOSCOPY;  Service: Gastroenterology;;   [place] REMOVAL  08/08/2023   Procedure: STENT REMOVAL;  Surgeon: Normie Becton., MD;  Location: [facility] ENDOSCOPY;  Service: Gastroenterology;;   surgery of right eye rectus muscles for diplpia correction   2019   TOTAL HIP ARTHROPLASTY Right 2010   TOTAL HIP ARTHROPLASTY Left 08/19/2019   Procedure: TOTAL HIP ARTHROPLASTY ANTERIOR APPROACH;  Surgeon: Liliane Rei, MD;  Location: [facility]S;  Service: Orthopedics;  Laterality: Left;    TURBINATE REDUCTION Left 11/17/2020   Procedure: LEFT SIDED TURBINATE REDUCTION;  Surgeon: Prescott Brodie, MD;  Location: [facility];  Service: ENT;  Laterality: Left;   URETHRAL SLING     midurethral sling with TVT Exact and Cystoscopy    I have reviewed the social history and family history with the patient and they are unchanged from previous note.  ALLERGIES:  is allergic to erythromycin,  macrobid [nitrofurantoin], keflex  [cephalexin ], creon  [pancrelipase  (lip-prot-amyl)], levofloxacin, penicillins, and sulfamethoxazole-trimethoprim .  MEDICATIONS:  Current Outpatient Medications  Medication Sig Dispense Refill   ALPRAZolam  (XANAX ) 0.5 MG tablet Take 0.25 mg by mouth as needed for anxiety. Takes as needed for sleep     AUVELITY 45-105 MG TBCR Take 1 tablet by mouth every evening.     buPROPion (WELLBUTRIN XL) 150 MG 24 hr tablet Take 150 mg by mouth daily.     Calcium Carbonate Antacid (CALCIUM CARBONATE PO) Take by mouth.     carbidopa -levodopa  (SINEMET  CR) 50-200 MG tablet Take 1 tablet by mouth at bedtime. 90 tablet 3   carbidopa -levodopa  (SINEMET  IR) 25-100 MG  tablet Take 1.5 tablets by mouth 4 (four) times daily. -Try to separate Sinemet  from food (especially protein-rich foods like meat, dairy, eggs) by about 30-60 mins - this will help the absorption of the medication. If you have some nausea with the medication, you can take it with some light food like crackers or ginger ale. Try taking at 7am, 10am, 1pm and 4pm and then at bedtime take the extended release(Sinemet  CR) 540 tablet 4   donepezil  (ARICEPT ) 10 MG tablet Take 1 tablet (10 mg total) by mouth at bedtime. 90 tablet 1   dronabinol  (MARINOL ) 2.5 MG capsule TAKE 1 CAPSULE BY MOUTH TWICE DAILY BEFORE LUNCH AND SUPPER 60 capsule 2   losartan  (COZAAR ) 100 MG tablet Take 100 mg by mouth daily.     Multiple Vitamin (MULTIVITAMIN WITH MINERALS) TABS tablet Take 1 tablet by mouth daily. Women's Multivitamin     OVER THE COUNTER MEDICATION Take 1 tablet by mouth daily as needed (constipation).     Polyethyl Glycol-Propyl Glycol (SYSTANE) 0.4-0.3 % SOLN Place 1 drop into both eyes 3 (three) times daily as needed (dry/irritated eyes.).     polyethylene glycol (MIRALAX  / GLYCOLAX ) 17 g packet Take 17 g by mouth daily.     promethazine  (PHENERGAN ) 25 MG tablet Take 25 mg by mouth every 6 (six) hours as needed for nausea or vomiting.     triamcinolone  (NASACORT ) 55 MCG/ACT AERO nasal inhaler Place 2 sprays into the nose daily. Use at night (Patient taking differently: Place 1 spray into the nose daily. Use at night) 1 each 12   No current facility-administered medications for this visit.    PHYSICAL EXAMINATION: ECOG PERFORMANCE STATUS: 2 - Symptomatic, <50% confined to bed  Vitals:   04/14/24 1421  BP: 110/60  Pulse: (!) 53  Resp: (!) 21  Temp: (!) 97.5 F (36.4 C)   Wt Readings from Last 3 Encounters:  04/14/24 127 lb 8 oz (57.8 kg)  02/17/24 129 lb 6.4 oz (58.7 kg)  02/12/24 126 lb (57.2 kg)     GENERAL:alert, no distress and comfortable SKIN: skin color, texture, turgor are normal, no  rashes or significant lesions EYES: normal, Conjunctiva are pink and non-injected, sclera clear NECK: supple, thyroid  normal size, non-tender, without nodularity LYMPH:  no palpable lymphadenopathy in the cervical, axillary  LUNGS: clear to auscultation and percussion with normal breathing effort HEART: regular rate & rhythm and no murmurs and no lower extremity edema ABDOMEN:abdomen soft, mild hepatomegaly with tenderness, normal bowel sounds Musculoskeletal:no cyanosis of digits and no clubbing  NEURO: alert & oriented x 3 with fluent speech, no focal motor/sensory deficits  Physical Exam   LABORATORY DATA:  I have reviewed the data as listed    Latest Ref Rng & Units 04/14/2024    1:47 PM 02/17/2024  1:20 PM 01/09/2024    9:55 AM  CBC  WBC 4.0 - 10.5 K/uL 9.2  7.7  8.1   Hemoglobin 12.0 - 15.0 g/dL 16.1  09.6  04.5   Hematocrit 36.0 - 46.0 % 35.1  39.1  39.5   Platelets 150 - 400 K/uL 346  309  371         Latest Ref Rng & Units 04/14/2024    1:47 PM 02/17/2024    1:20 PM 01/09/2024    9:55 AM  CMP  Glucose 70 - 99 mg/dL 409  811  94   BUN 8 - 23 mg/dL 17  20  21    Creatinine 0.44 - 1.00 mg/dL 9.14  7.82  9.56   Sodium 135 - 145 mmol/L 137  137  141   Potassium 3.5 - 5.1 mmol/L 3.8  3.6  3.3   Chloride 98 - 111 mmol/L 105  104  106   CO2 22 - 32 mmol/L 25  27  25    Calcium 8.9 - 10.3 mg/dL 9.0  8.6  9.0   Total Protein 6.5 - 8.1 g/dL 7.1  6.7    Total Bilirubin 0.0 - 1.2 mg/dL 0.7  0.7    Alkaline Phos 38 - 126 U/L 195  162    AST 15 - 41 U/L 32  24    ALT 0 - 44 U/L 7  7        RADIOGRAPHIC STUDIES: I have personally reviewed the radiological images as listed and agreed with the findings in the report. No results found.    No orders of the defined types were placed in this encounter.  All questions were answered. The patient knows to call the clinic with any problems, questions or concerns. No barriers to learning was detected. The total time spent in the  appointment was 30 minutes.     Sonja [place], MD 04/14/2024

## 2024-04-15 LAB — CANCER ANTIGEN 19-9: CA 19-9: 1671 U/mL — ABNORMAL HIGH (ref 0–35)

## 2024-04-23 ENCOUNTER — Encounter: Payer: Self-pay | Admitting: Hematology

## 2024-04-23 DIAGNOSIS — C250 Malignant neoplasm of head of pancreas: Secondary | ICD-10-CM

## 2024-04-24 ENCOUNTER — Other Ambulatory Visit: Payer: Self-pay

## 2024-04-24 DIAGNOSIS — C250 Malignant neoplasm of head of pancreas: Secondary | ICD-10-CM

## 2024-04-24 DIAGNOSIS — R159 Full incontinence of feces: Secondary | ICD-10-CM

## 2024-05-07 ENCOUNTER — Encounter: Payer: Self-pay | Admitting: Hematology

## 2024-05-07 ENCOUNTER — Other Ambulatory Visit: Payer: Self-pay | Admitting: Hematology

## 2024-05-07 MED ORDER — MEGESTROL ACETATE 400 MG/10ML PO SUSP: 400.0000 mg | Freq: Every day | ORAL | 0 refills | Status: AC

## 2024-05-21 ENCOUNTER — Other Ambulatory Visit: Payer: Self-pay | Admitting: Nurse Practitioner

## 2024-05-21 MED ORDER — TRAMADOL HCL 50 MG PO TABS: 50.0000 mg | ORAL_TABLET | Freq: Four times a day (QID) | ORAL | 0 refills | Status: AC | PRN

## 2024-06-04 ENCOUNTER — Other Ambulatory Visit: Payer: Self-pay

## 2024-06-04 ENCOUNTER — Telehealth: Payer: Self-pay

## 2024-06-04 NOTE — Telephone Encounter (Signed)
 Pt's daughter called to inform Dr. Lanny that the pt is going on Hospice with [facility].  Pt's daughter wanted to say Thank You to Dr. Lanny for all the GREAT care she has given the pt.  Stated this nurse will notify Dr. Lanny and informed pt that we are here for them should they have additional questions or concerns.  Pt's daughter was grateful and had no further questions.

## 2024-06-07 ENCOUNTER — Emergency Department (HOSPITAL_BASED_OUTPATIENT_CLINIC_OR_DEPARTMENT_OTHER)

## 2024-06-07 ENCOUNTER — Emergency Department (HOSPITAL_BASED_OUTPATIENT_CLINIC_OR_DEPARTMENT_OTHER)
Admission: EM | Admit: 2024-06-07 | Discharge: 2024-06-07 | Disposition: A | Discharge status: Home / Self Care | Attending: Emergency Medicine | Admitting: Emergency Medicine

## 2024-06-07 ENCOUNTER — Other Ambulatory Visit: Payer: Self-pay

## 2024-06-07 DIAGNOSIS — J0130 Acute sphenoidal sinusitis, unspecified: Secondary | ICD-10-CM | POA: Insufficient documentation

## 2024-06-07 DIAGNOSIS — G20C Parkinsonism, unspecified: Secondary | ICD-10-CM | POA: Diagnosis not present

## 2024-06-07 DIAGNOSIS — I129 Hypertensive chronic kidney disease with stage 1 through stage 4 chronic kidney disease, or unspecified chronic kidney disease: Secondary | ICD-10-CM | POA: Insufficient documentation

## 2024-06-07 DIAGNOSIS — Z8507 Personal history of malignant neoplasm of pancreas: Secondary | ICD-10-CM | POA: Insufficient documentation

## 2024-06-07 DIAGNOSIS — Z79899 Other long term (current) drug therapy: Secondary | ICD-10-CM | POA: Insufficient documentation

## 2024-06-07 DIAGNOSIS — N3000 Acute cystitis without hematuria: Secondary | ICD-10-CM | POA: Diagnosis not present

## 2024-06-07 DIAGNOSIS — H538 Other visual disturbances: Secondary | ICD-10-CM | POA: Insufficient documentation

## 2024-06-07 DIAGNOSIS — N183 Chronic kidney disease, stage 3 unspecified: Secondary | ICD-10-CM | POA: Insufficient documentation

## 2024-06-07 DIAGNOSIS — R110 Nausea: Secondary | ICD-10-CM | POA: Diagnosis present

## 2024-06-07 LAB — COMPREHENSIVE METABOLIC PANEL WITH GFR
ALT: <5 U/L (ref 0–44)
AST: 21 U/L (ref 15–41)
Albumin: 3 g/dL — ABNORMAL LOW (ref 3.5–5.0)
Alkaline Phosphatase: 194 U/L — ABNORMAL HIGH (ref 38–126)
Anion gap: 10 (ref 5–15)
BUN: 23 mg/dL (ref 8–23)
CO2: 24 mmol/L (ref 22–32)
Calcium: 9 mg/dL (ref 8.9–10.3)
Chloride: 102 mmol/L (ref 98–111)
Creatinine, Ser: 0.97 mg/dL (ref 0.44–1.00)
GFR, Estimated: 59 mL/min — ABNORMAL LOW (ref >60)
Glucose, Bld: 123 mg/dL — ABNORMAL HIGH (ref 70–99)
Potassium: 3.6 mmol/L (ref 3.5–5.1)
Sodium: 136 mmol/L (ref 135–145)
Total Bilirubin: 0.7 mg/dL (ref 0.0–1.2)
Total Protein: 6.8 g/dL (ref 6.5–8.1)

## 2024-06-07 LAB — CBC
HCT: 31.7 % — ABNORMAL LOW (ref 36.0–46.0)
Hemoglobin: 10.7 g/dL — ABNORMAL LOW (ref 12.0–15.0)
MCH: 31.8 pg (ref 26.0–34.0)
MCHC: 33.8 g/dL (ref 30.0–36.0)
MCV: 94.1 fL (ref 80.0–100.0)
Platelets: 374 10*3/uL (ref 150–400)
RBC: 3.37 MIL/uL — ABNORMAL LOW (ref 3.87–5.11)
RDW: 13 % (ref 11.5–15.5)
WBC: 7.2 10*3/uL (ref 4.0–10.5)
nRBC: 0 % (ref 0.0–0.2)

## 2024-06-07 LAB — URINALYSIS, ROUTINE W REFLEX MICROSCOPIC
Bilirubin Urine: NEGATIVE
Glucose, UA: NEGATIVE mg/dL
Hgb urine dipstick: NEGATIVE
Ketones, ur: NEGATIVE mg/dL
Nitrite: POSITIVE — AB
Protein, ur: NEGATIVE mg/dL
Specific Gravity, Urine: 1.014 (ref 1.005–1.030)
pH: 5.5 (ref 5.0–8.0)

## 2024-06-07 MED ORDER — CEFPODOXIME PROXETIL 200 MG PO TABS: 200.0000 mg | ORAL_TABLET | Freq: Two times a day (BID) | ORAL | 0 refills | Status: AC

## 2024-06-07 MED ORDER — SODIUM CHLORIDE 0.9 % IV SOLN
1.0000 g | Freq: Once | INTRAVENOUS | Status: AC
  Administered 2024-06-07: 1 g via INTRAVENOUS
  Filled 2024-06-07: qty 10

## 2024-06-07 MED ORDER — SODIUM CHLORIDE 0.9 % IV BOLUS
1000.0000 mL | Freq: Once | INTRAVENOUS | Status: AC
  Administered 2024-06-07: 1000 mL via INTRAVENOUS

## 2024-06-07 NOTE — Discharge Instructions (Addendum)
 Follow-up with your doctors as needed.  Follow-up with hospice on Thursday as planned.

## 2024-06-07 NOTE — ED Notes (Addendum)
 Reviewed discharge instructions, medications, and home care with pt and family. Pt verbalized understanding and had no further questions. Pt exited ED without complications.

## 2024-06-07 NOTE — ED Triage Notes (Signed)
 Pt POV with caregiver ambulatory to triage reporting blurred vision and dizziness that began 3 days ago. Hx Parkinson's and pancreatic cancer, transitioning to hospice care on Thursday. No unilateral weakness or slurred speech noted in triage.

## 2024-06-07 NOTE — ED Provider Notes (Signed)
 [place] EMERGENCY DEPARTMENT AT [facility] Provider Note   CSN: [id number] Arrival date & time: 06/07/24  1816     Patient presents with: No chief complaint on file.   Marie Ford is a 78 y.o. female.   HPI Patient with pancreatic cancer and Parkinson disease.  Due to go on hospice on Thursday with today being Sunday.  States for the last 3 to 4 days been feeling more dizzy.  Some blurred vision.  History of chronic diplopia.  No headache.  Mildly decreased oral intake.  Does have some chronic nausea and took half of the Compazine  earlier today.   Past Medical History:  Diagnosis Date   Chronic kidney disease    stage 3    Chronic sphenoidal sinusitis    Degenerative arthritis of hip    requirde surgery 2009   Depression 2010   Diplopia 2011   in left lateral gaze    Diplopia     my eyes dont track together, i have corrective prisms in my eye glasses    Diverticulosis 2006   noted in a colonoscopy   Esophageal reflux 2013   Hypertension    IGT (impaired glucose tolerance) 2011   denies hx of diabetes    Insomnia 2009   Medical history non-contributory    Mixed hyperlipidemia 2000   Osteoarthritis    Osteopenia 2005   Other and unspecified hyperlipidemia 2000   Other specified disease of sebaceous glands    Palpitations 2009   secondary to PSVT   Parkinson disease (HCC)    PNA (pneumonia) 2014   Psoriasis 2004   PSVT (paroxysmal supraventricular tachycardia) (HCC) 2009   report it has been some time since i had that     Situational stress    Urinary tract infection    thru wed 05/15/23, completed 3d antibiotics   Venous insufficiency since 2017    Prior to Admission medications   Medication Sig Start Date End Date Taking? Authorizing Provider  cefpodoxime (VANTIN) 200 MG tablet Take 1 tablet (200 mg total) by mouth 2 (two) times daily. 06/07/24  Yes Patsey Lot, MD  ALPRAZolam  (XANAX ) 0.5 MG tablet Take 0.25 mg by mouth as needed for  anxiety. Takes as needed for sleep    [provider]  AUVELITY 45-105 MG TBCR Take 1 tablet by mouth every evening.    [provider]  buPROPion (WELLBUTRIN XL) 150 MG 24 hr tablet Take 150 mg by mouth daily.    [provider]  Calcium Carbonate Antacid (CALCIUM CARBONATE PO) Take by mouth.    [provider]  carbidopa -levodopa  (SINEMET  CR) 50-200 MG tablet Take 1 tablet by mouth at bedtime. 10/02/23   Ines Onetha NOVAK, MD  carbidopa -levodopa  (SINEMET  IR) 25-100 MG tablet Take 1.5 tablets by mouth 4 (four) times daily. -Try to separate Sinemet  from food (especially protein-rich foods like meat, dairy, eggs) by about 30-60 mins - this will help the absorption of the medication. If you have some nausea with the medication, you can take it with some light food like crackers or ginger ale. Try taking at 7am, 10am, 1pm and 4pm and then at bedtime take the extended release(Sinemet  CR) 10/02/23   Ines Onetha NOVAK, MD  donepezil  (ARICEPT ) 10 MG tablet Take 1 tablet (10 mg total) by mouth at bedtime. 12/31/23   Ines Onetha NOVAK, MD  dronabinol  (MARINOL ) 2.5 MG capsule TAKE 1 CAPSULE BY MOUTH TWICE DAILY BEFORE LUNCH AND SUPPER 02/25/24   Boscia,  Heather E, NP  losartan  (COZAAR ) 100 MG tablet Take 100 mg by mouth daily. 12/26/23   [provider]  megestrol  (MEGACE ) 400 MG/10ML suspension Take 10 mLs (400 mg total) by mouth daily. 05/07/24   Lanny Callander, MD  Multiple Vitamin (MULTIVITAMIN WITH MINERALS) TABS tablet Take 1 tablet by mouth daily. Women's Multivitamin    [provider]  OVER THE COUNTER MEDICATION Take 1 tablet by mouth daily as needed (constipation).    [provider]  Polyethyl Glycol-Propyl Glycol (SYSTANE) 0.4-0.3 % SOLN Place 1 drop into both eyes 3 (three) times daily as needed (dry/irritated eyes.).    [provider]  polyethylene glycol (MIRALAX  / GLYCOLAX ) 17 g packet Take 17 g by mouth daily.    [provider]  promethazine  (PHENERGAN ) 25 MG tablet Take 25 mg by mouth every 6 (six) hours as needed for nausea or vomiting.    [provider]  traMADol  (ULTRAM ) 50 MG tablet Take 1 tablet (50 mg total) by mouth every 6 (six) hours as needed. 05/21/24   Burton, Lacie K, NP  triamcinolone  (NASACORT ) 55 MCG/ACT AERO nasal inhaler Place 2 sprays into the nose daily. Use at night Patient taking differently: Place 1 spray into the nose daily. Use at night 11/01/20   Ethyl Lonni BRAVO, MD    Allergies: Erythromycin, Macrobid [nitrofurantoin], Keflex  [cephalexin ], Creon  [pancrelipase  (lip-prot-amyl)], Levofloxacin, Penicillins, and Sulfamethoxazole-trimethoprim     Review of Systems  Updated Vital Signs BP (!) 166/87   Pulse 79   Temp 97.7 F (36.5 C) (Oral)   Resp (!) 23   SpO2 99%   Physical Exam Vitals and nursing note reviewed.   Eyes:     Extraocular Movements: Extraocular movements intact.    Cardiovascular:     Rate and Rhythm: Normal rate and regular rhythm.  Pulmonary:     Breath sounds: No wheezing.   Neurological:     Mental Status: She is alert and oriented to person, place, and time.     (all labs ordered are listed, but only abnormal results are displayed) Labs Reviewed  COMPREHENSIVE METABOLIC PANEL WITH GFR - Abnormal; Notable for the following components:      Result Value   Glucose, Bld 123 (*)    Albumin 3.0 (*)    Alkaline Phosphatase 194 (*)    GFR, Estimated 59 (*)    All other components within normal limits  CBC - Abnormal; Notable for the following components:   RBC 3.37 (*)    Hemoglobin 10.7 (*)    HCT 31.7 (*)    All other components within normal limits  URINALYSIS, ROUTINE W REFLEX MICROSCOPIC - Abnormal; Notable for the following components:   Nitrite POSITIVE (*)    Leukocytes,Ua SMALL (*)    Bacteria, UA MANY (*)    Crystals PRESENT (*)    All other components within normal limits  CBG MONITORING, ED     EKG: None  Radiology: CT Head Wo Contrast Result Date: 06/07/2024 CLINICAL DATA:  Central vertigo. History of Parkinson's disease and pancreatic cancer. EXAM: CT HEAD WITHOUT CONTRAST TECHNIQUE: Contiguous axial images were obtained from the base of the skull through the vertex without intravenous contrast. RADIATION DOSE REDUCTION: This exam was performed according to the departmental dose-optimization program which includes automated exposure control, adjustment of the mA and/or kV according to patient size and/or use of iterative reconstruction technique. COMPARISON:  Head CT 12/01/2023. FINDINGS: Brain: There is mild global atrophy, mild atrophic ventriculomegaly, and mild-to-moderate  small-vessel disease of the cerebral white matter, scattered chronic bilateral gangliocapsular lacunar infarcts. No acute cortical based infarct, hemorrhage, mass or mass effect is seen. There is no midline shift. The basal cisterns are clear. Trace mineralization both basal ganglia. Vascular: There are moderate to heavy calcifications in carotid siphons, distal left vertebral artery. No hyperdense vessels are seen. Skull: Negative for fractures or focal lesions. Sinuses/Orbits: Negative orbits apart from old lens extractions. No mastoid or middle ear effusions. Interval new opacification noted right sphenoid sinus with consolidated membranes and fluid. Other sinuses are clear with chronic surgical changes on the left including middle and inferior turbinectomies and left maxillary sinus antrostomy. Hypoplastic frontal sinus. Other: None. IMPRESSION: 1. No acute intracranial CT findings or intracranial interval changes. 2. Atrophy and small-vessel disease. 3. Atherosclerosis. 4. Interval new opacification of the right sphenoid sinus with consolidated membranes and fluid. Chronic sinonasal surgical changes on the left. Electronically Signed   By: Francis Quam M.D.   On: 06/07/2024 20:13     Procedures   Medications  Ordered in the ED  sodium chloride  0.9 % bolus 1,000 mL (0 mLs Intravenous Stopped 06/07/24 2046)  cefTRIAXone (ROCEPHIN) 1 g in sodium chloride  0.9 % 100 mL IVPB (0 g Intravenous Stopped 06/07/24 2146)                                    Medical Decision Making Amount and/or Complexity of Data Reviewed Labs: ordered. Radiology: ordered.  Risk Prescription drug management.  Dizziness.  Feeling somewhat bad.  Does have history of pancreatic cancer due to start hospice.  States feeling dizzy.  Differential diagnoses long but includes causes such as intracranial mets, stroke.  Rather nonfocal exam.  Will get basic blood work.  Patient is going towards hospice, but would potentially benefit from steroids if there were edema around a tumor.  Will get CT scan to evaluate.  CT scan shows potentially a sphenoid sinusitis.  Urinalysis also shows infection.  Feeling better after treatment with fluids.  Particularly with her going on hospice later this week I think outpatient management will be appropriate.  Discussed with pharmacist and will give Rocephin here and cefpodoxime for home.  Appears stable for discharge.     Final diagnoses:  Acute sphenoidal sinusitis, recurrence not specified  Acute cystitis without hematuria    ED Discharge Orders          Ordered    cefpodoxime (VANTIN) 200 MG tablet  2 times daily        06/07/24 2113               Patsey Lot, MD 06/07/24 2211

## 2024-06-09 ENCOUNTER — Other Ambulatory Visit: Payer: Self-pay

## 2024-06-10 ENCOUNTER — Other Ambulatory Visit

## 2024-06-10 ENCOUNTER — Ambulatory Visit: Admitting: Hematology

## 2024-08-10 DEATH — deceased
